# Patient Record
Sex: Female | Born: 1965
Health system: Southern US, Community
[De-identification: ages and names within clinical notes are randomized; demographics above are authoritative.]

## PROBLEM LIST (undated history)

## (undated) DIAGNOSIS — M329 Systemic lupus erythematosus, unspecified: Secondary | ICD-10-CM

## (undated) DIAGNOSIS — J449 Chronic obstructive pulmonary disease, unspecified: Secondary | ICD-10-CM

## (undated) DIAGNOSIS — F419 Anxiety disorder, unspecified: Secondary | ICD-10-CM

## (undated) DIAGNOSIS — E559 Vitamin D deficiency, unspecified: Secondary | ICD-10-CM

## (undated) DIAGNOSIS — J45909 Unspecified asthma, uncomplicated: Secondary | ICD-10-CM

## (undated) DIAGNOSIS — M069 Rheumatoid arthritis, unspecified: Secondary | ICD-10-CM

## (undated) DIAGNOSIS — C3490 Malignant neoplasm of unspecified part of unspecified bronchus or lung: Secondary | ICD-10-CM

## (undated) DIAGNOSIS — J189 Pneumonia, unspecified organism: Secondary | ICD-10-CM

## (undated) DIAGNOSIS — Z5189 Encounter for other specified aftercare: Secondary | ICD-10-CM

## (undated) DIAGNOSIS — I10 Essential (primary) hypertension: Secondary | ICD-10-CM

## (undated) DIAGNOSIS — K219 Gastro-esophageal reflux disease without esophagitis: Secondary | ICD-10-CM

## (undated) HISTORY — DX: Rheumatoid arthritis, unspecified: M06.9

## (undated) HISTORY — PX: BUNIONECTOMY: SHX129

## (undated) HISTORY — DX: Systemic lupus erythematosus, unspecified: M32.9

## (undated) HISTORY — DX: Gastro-esophageal reflux disease without esophagitis: K21.9

## (undated) HISTORY — DX: Vitamin D deficiency, unspecified: E55.9

## (undated) HISTORY — DX: Encounter for other specified aftercare: Z51.89

## (undated) HISTORY — DX: Essential (primary) hypertension: I10

## (undated) HISTORY — DX: Malignant neoplasm of unspecified part of unspecified bronchus or lung: C34.90

## (undated) HISTORY — DX: Anxiety disorder, unspecified: F41.9

## (undated) HISTORY — DX: Unspecified asthma, uncomplicated: J45.909

---

## 2000-09-29 ENCOUNTER — Emergency Department (HOSPITAL_COMMUNITY): Admission: EM | Admit: 2000-09-29 | Discharge: 2000-09-30 | Payer: Self-pay | Admitting: Emergency Medicine

## 2001-10-07 ENCOUNTER — Inpatient Hospital Stay (HOSPITAL_COMMUNITY): Admission: AD | Admit: 2001-10-07 | Discharge: 2001-10-10 | Payer: Self-pay | Admitting: Internal Medicine

## 2001-10-08 ENCOUNTER — Encounter: Payer: Self-pay | Admitting: Internal Medicine

## 2001-10-09 ENCOUNTER — Encounter: Payer: Self-pay | Admitting: Internal Medicine

## 2002-09-30 ENCOUNTER — Emergency Department (HOSPITAL_COMMUNITY): Admission: EM | Admit: 2002-09-30 | Discharge: 2002-09-30 | Payer: Self-pay | Admitting: Emergency Medicine

## 2002-09-30 ENCOUNTER — Encounter: Payer: Self-pay | Admitting: Emergency Medicine

## 2003-08-16 ENCOUNTER — Other Ambulatory Visit: Admission: RE | Admit: 2003-08-16 | Discharge: 2003-08-16 | Payer: Self-pay | Admitting: Obstetrics and Gynecology

## 2003-09-03 ENCOUNTER — Other Ambulatory Visit: Admission: RE | Admit: 2003-09-03 | Discharge: 2003-09-03 | Payer: Self-pay | Admitting: Obstetrics and Gynecology

## 2004-07-20 ENCOUNTER — Emergency Department (HOSPITAL_COMMUNITY): Admission: EM | Admit: 2004-07-20 | Discharge: 2004-07-20 | Payer: Self-pay | Admitting: Emergency Medicine

## 2005-06-18 ENCOUNTER — Other Ambulatory Visit: Admission: RE | Admit: 2005-06-18 | Discharge: 2005-06-18 | Payer: Self-pay | Admitting: Obstetrics and Gynecology

## 2007-02-14 ENCOUNTER — Other Ambulatory Visit: Admission: RE | Admit: 2007-02-14 | Discharge: 2007-02-14 | Payer: Self-pay | Admitting: Obstetrics and Gynecology

## 2008-02-16 ENCOUNTER — Other Ambulatory Visit: Admission: RE | Admit: 2008-02-16 | Discharge: 2008-02-16 | Payer: Self-pay | Admitting: Obstetrics and Gynecology

## 2011-04-17 NOTE — Discharge Summary (Signed)
[place]. [facility]  Patient:    Tammy Cordova, Tammy Cordova Visit Number: [id number] MRN: [id number]          Service Type: MED Location: [phone] Attending Physician:  Tammy Cordova Admit Date:  10/07/2001 Discharge Date: 10/10/2001   CC:         Tammy Cordova, M.D.   Discharge Summary  DATE OF BIRTH:  11-Apr-1966  DISCHARGE DIAGNOSES: 1. Severe left lower lobe - left upper lobe community acquired pneumonia    with septicemia and pleural effusion.    a. Hypoxia, resolved.    b. Blood and sputum cultures pending. 2. Severe dehydration, resolved. 3. Hypokalemia, resolved. 4. Tobacco abuse, cessation addressed during this hospital stay. 5. Mild abnormal liver function tests secondary to problem #1, improved. 6. Normocytic anemia, hemoglobin 10.4, MCV 85.  DISCHARGE MEDICATIONS: 1. Tequin 400 mg p.o. q.d. x 10 days. 2. Combivent MDI two puffs four times a day. 3. Humibid LA two tablets t.i.d. 4. Robitussin DM p.r.n. cough.  FOLLOW-UP:  Tammy Cordova will be followed in [facility] by Dr. Caryl Cordova on Friday, October 14, 2001, at 2:40 p.m.  During this follow-up visit, Dr. Caryl Cordova is to reassess the patients pulmonary status. We recommend a follow-up chest x-ray within two to three weeks upon discharge to confirm the total resolution of these severe infiltrates.  We also recommend following the patients sputum cultures along with a blood culture, that are prending at this point.  Finally, we recommend to encourage smoking cessation and consider Strep Pneumo vaccination this year.  CONSULTING PHYSICIANS:  None.  PROCEDURE:  None.  DISCHARGE LABORATORY DATA:  Hemoglobin 10.4, MCV 85, WBC 15.4, platelets 239. Sodium 137, potassium 5.4, chloride 108, CO2 24, BUN 4, creatinine 0.6, glucose 132.  SGOT 40, SGPT 31, alkaline phosphatase 83, total bilirubin 0.6, albumin 1.8.  Blood cultures negative x 2 x 3 days.  Sputum  gram stain consistent with gram negative rods, culture pending.  HOSPITAL COURSE:  Tammy Cordova is a very pleasant 45 year old female admitted to [facility] on October 07, 2001, with pneumonia associated with septicemia and hypoxia.  Please see admission H&P by Dr. Jamie Cordova for further details regarding the history of presentation, physical examination, and lab data.  #1 - Severe left lower lobe - left upper lobe community acquired pneumonia with septicemia and pleural effusion.  The patient presented with a temperature of 104, leukocytosis of 21,000, confused, tachycardic with a heart rate of 130s, and with a pulse oximetry of 88% on room air.  The x-ray obtained at [facility] revealed a bilobar pneumonia effecting both the left upper and left lower lobes.  Blood cultures were obtained prior to intravenous antibiotic therapy was started.  We ordered aggressive bronchodilator therapy around the clock along with Humibid for pulmonary toilette.  Once the patient was hydrated, she started producing phlegm. Sputum was sent for gram stain and culture.  This gram stain revealed gram negative rods.  The sputum culture is not finalized.  The patients fever and leukocytosis started to improve on day #2.  Her tachycardia long with confusion resolved by day #2.  On day #3, again the temperature continued improving and the patient became hemodynamically stable.  The blood and the sputum cultures were not finalized and the final results were not available at the time of this dictation.  On the day of discharge, the patient had a low grade temperature, though, her hypoxia was completely resolved.  She was able to ambulate experiencing minimal dyspnea of exertion, though, the oxygen saturation remained stable and above 91%.  The chest x-ray on day #2 showed significantly worsening of the bilobar infiltrate effecting the left lung associated with pleural effusion.  On  the day prior to discharge, early signs of improvement with increased aeration started to be noted.  In retrospect, we believe that this person presented with community acquired pneumonia.  She is a smoker and based on the gram stain, H.flu pneumonia is possible.  The patients antibiotic therapy was switched to tablets without complications and she was discharged home.  Further follow-up on the patients chest x-ray and leukocytosis is recommended.  The leukocytosis showed a steady improvement from 21,000 to 15,000 on the day of discharge.  #2 - Dehydration and hypokalemia.  The patient presented with severe dehydration associated with hypokalemia.  The serum potassium level was 3.3. With supplementation, this hypokalemia resolved.  Tammy Cordova received IV fluids throughout this hospital stay without complications.  AT the time of discharge, no evidence of dehydration was noted.  #3 - Normocytic anemia.  The patient presented with a hemoglobin of 11.5 with a normal MCV. With aggressive hydration, her hemoglobin stayed stable in the 10 range.  No signs of hemosis nor bleeding was noticed.  We recommend to follow the patients anemia within two weeks or so to confirm the improvement of this hemoglobin.  Her hemoglobin at the time of discharge was 10.4 with an MCV of 85.  If anemia is to persist for two to three weeks, further workup may be required.  #5 - Tobacco abuse.  We addressed smoking cessation throughout this hospital stay.  The patient declined nicotine patch.  She felt really strongly about quitting smoking.  The patients primary care Tammy Cordova will continue encouraging smoking cessation.  #6 - Mild abnormal LFTs.  These mild LFTs were related to problem #1. Abdominal examination was benign and the LFTs were improved by the time of discharge.  I spent about 40 minutes in the discharge process of this patient.  CONDITION ON DISCHARGE:  Improved. Attending Physician:   Tammy Cordova DD:  10/10/01 TD:  10/11/01  Job: [id number]

## 2011-04-17 NOTE — H&P (Signed)
Tammy Cordova. [facility]  [place]:    Tammy, Cordova Visit Number: [id number] MRN: [id number]          Service Type: MED Location: [phone] Attending Physician:  Tammy Cordova Dictated by:   Tammy Cordova, M.D. Admit Date:  10/07/2001   CC:         Tammy Cordova, M.D. in [facility]   History and Physical  DATE OF BIRTH:  05-07-1966  PROBLEM LIST: 1. Bilateral pneumonia with septicemia, community-acquired pneumonia. 2. Dehydration. 3. Tobacco abuse.  CHIEF COMPLAINT:  Shortness of breath and malaise.  HISTORY OF PRESENT ILLNESS:  Tammy Cordova is a very pleasant 45 year old female who presents with a six-day history of malaise, myalgias, fever, chills, diarrhea, and dry cough.  Tammy Cordova is a smoker.  No history of diabetes, hypertension, malignancy, or IV drug abuse.  Tammy Cordova was seen on October 03, 2001 in [facility] with a temperature of 102. She had a negative urinalysis and she was told that she had possibly a viral syndrome.  Two days later, Tammy Cordova returned feeling not better.  Her temperature was 102.7.  A CBC was obtained.  Tammy white cell count was 25,000 with a left shift and Tammy Cordova was started on Cefzil.   Tammy next day - October 06, 2001 - Tammy Cordova returned feeling somewhat better but Tammy temperature was 103.  Today, Tammy Cordova returned with shortness of breath. Her saturations were 88-90% on room air.  A chest x-ray showed bilateral lower lobe infiltrates.  Tammy Cordova denies hemoptysis, hematemesis, tuberculosis exposure or previous history with this infection.  No IV drug abuse.  No malignancy.  No diabetes, coronary artery disease, hypertension.  She denies focal weakness or syncope.  She describes lightheadedness standing up.  She describes anorexia and liquid diarrhea for Tammy last four to five days.  No abdominal pain.  Tammy last menstrual period was  on September 23, 2001.  She denies unprotected sex. She denies urinary symptoms.  No vaginal discharge.  No skin rash.  She describes some headaches though no photophobia.  No lower back pain.  She describes some pleuritic chest pain though no orthopnea.  She also reports some mild dyspnea of exertion.  PAST MEDICAL HISTORY:  As problem list.  ALLERGIES:  None.  MEDICATIONS:  Cefzil 200 mg p.o. b.i.d.  PAST MEDICAL HISTORY:  Significant for diabetes (her mother died from diabetic complications).  Also, her mother had a stroke at age 22.  Her brother and sister both have hypertension.  Her father had his first MI in his mid 35s. Her grandmother died of a cancer though she does not know Tammy type.  SOCIAL HISTORY:  Tammy Cordova is married.  No children.  She smoked about 10 cigarettes a day for Tammy last 17 years.  Occasional alcohol use.  No illegal drug abuse.  She works in [employer]  REVIEW OF SYSTEMS:  As HPI.  PHYSICAL EXAMINATION:  VITAL SIGNS:  Temperature 104.1, heart rate 125, blood pressure 138/76, respirations 36, oxygen saturation 88% on room air.  HEENT:  Normocephalic, nontraumatic.  Nonicteric sclerae.  Conjunctivae slightly pale.  Otherwise, within normal limits.  PERRLA, EOMI.  Funduscopic exam negative for papilledema or hemorrhages.  Very dry mucous membranes. Oropharynx clear.  TMs within Tammy normal limits.  NECK:  Supple.  No JVD, no bruits, no adenopathy, no thyromegaly.  LUNGS:  Bibasilar rales and rhonchi.  No wheezing.  Fair air movement bilaterally.  CARDIAC:  Tachycardic.  No murmurs, rubs, or gallops.  Normal S1, S2.  ABDOMEN:  Flat, nontender, nondistended.  Bowel sounds were present.  No hepatosplenomegaly.  No rebound, no guarding, no masses.  SKIN:  Normal tenting.  No rash.  GENITOURINARY:  Within Tammy normal limits.  BREAST:  Within Tammy normal limits.  RECTAL:  Empty vault, normal sphincter tone.  EXTREMITIES:  No edema,  clubbing, or cyanosis.  Pulses 2+ bilaterally.  NEUROLOGIC:  Alert and oriented x 3.  Strength 5/5 in all extremities.  DTRs 3/5 in all extremities.  Cranial nerves 2-12 intact.  Sensoria intact. Plantar reflexes downgoing bilaterally.  LABORATORY DATA:  Pending.  ASSESSMENT AND PLAN: 1. Bilateral pneumonia with septicemia - Tammy differential diagnosis includes    community-acquired pneumonia.  Given Tammy patients age, myoplasm, H. flu,    and streptococcal pneumonia tend to be Tammy most common microorganisms    associated with this presentation.  Also, viral pneumonia - specifically,    influenza - needs to be considered.  Other etiologies like Legionella or    fungi pneumonia tend to be less likely in this type of setting.    Plan:  Will admit Tammy Cordova to a regular bed.  Will obtain blood and    sputum studies.  Also will support Tammy Cordova with oxygen supplementation,    bronchodilator treatments around Tammy clock, and Humibid.  Intravenous    Tequin along with amantadine will be started for broad-spectrum coverage.    Will follow up closely Tammy patients chest x-ray and will be monitoring    Tammy patients respiratory status. 2. Dehydration - Tammy physical exam is consistent with a moderate to severe    degree of dehydration.  Tammy diarrhea and decreased p.o. intake seem to    be Tammy most likely sources of this dehydration, along with high fever.    Will start aggressive intravenous hydration therapy.  Tammy fluid balance    will be monitored along with orthostatics and daily weights. 3. Tobacco abuse - Spent about 15 minutes talking about smoking cessation.    For now Tammy Cordova declines Tammy nicotine patch. Dictated by:   Tammy Cordova, M.D. Attending Physician:  Tammy Cordova DD:  10/07/01 TD:  10/08/01 Job: [id number]

## 2012-08-27 ENCOUNTER — Encounter (HOSPITAL_COMMUNITY): Payer: Self-pay | Admitting: Adult Health

## 2012-08-27 ENCOUNTER — Emergency Department (HOSPITAL_COMMUNITY)
Admission: EM | Admit: 2012-08-27 | Discharge: 2012-08-28 | Disposition: A | Discharge status: Home / Self Care | Payer: PRIVATE HEALTH INSURANCE | Attending: Emergency Medicine | Admitting: Emergency Medicine

## 2012-08-27 ENCOUNTER — Emergency Department (HOSPITAL_COMMUNITY): Payer: PRIVATE HEALTH INSURANCE

## 2012-08-27 DIAGNOSIS — R0602 Shortness of breath: Secondary | ICD-10-CM | POA: Insufficient documentation

## 2012-08-27 DIAGNOSIS — J189 Pneumonia, unspecified organism: Secondary | ICD-10-CM

## 2012-08-27 DIAGNOSIS — R05 Cough: Secondary | ICD-10-CM | POA: Insufficient documentation

## 2012-08-27 DIAGNOSIS — IMO0001 Reserved for inherently not codable concepts without codable children: Secondary | ICD-10-CM | POA: Insufficient documentation

## 2012-08-27 DIAGNOSIS — R059 Cough, unspecified: Secondary | ICD-10-CM | POA: Insufficient documentation

## 2012-08-27 DIAGNOSIS — F17200 Nicotine dependence, unspecified, uncomplicated: Secondary | ICD-10-CM | POA: Insufficient documentation

## 2012-08-27 DIAGNOSIS — R079 Chest pain, unspecified: Secondary | ICD-10-CM | POA: Insufficient documentation

## 2012-08-27 HISTORY — DX: Pneumonia, unspecified organism: J18.9

## 2012-08-27 LAB — CBC
HCT: 32.8 % — ABNORMAL LOW (ref 36.0–46.0)
Hemoglobin: 11.1 g/dL — ABNORMAL LOW (ref 12.0–15.0)
MCHC: 33.8 g/dL (ref 30.0–36.0)
MCV: 87.5 fL (ref 78.0–100.0)
RDW: 13.9 % (ref 11.5–15.5)
WBC: 8.4 10*3/uL (ref 4.0–10.5)

## 2012-08-27 LAB — BASIC METABOLIC PANEL
BUN: 7 mg/dL (ref 6–23)
Chloride: 103 mEq/L (ref 96–112)
Creatinine, Ser: 0.52 mg/dL (ref 0.50–1.10)
GFR calc Af Amer: >90 mL/min (ref >90)
Glucose, Bld: 102 mg/dL — ABNORMAL HIGH (ref 70–99)

## 2012-08-27 LAB — POCT I-STAT TROPONIN I

## 2012-08-27 MED ORDER — SODIUM CHLORIDE 0.9 % IV BOLUS (SEPSIS)
1000.0000 mL | Freq: Once | INTRAVENOUS | Status: DC
Start: 2012-08-27 — End: 2012-08-28

## 2012-08-27 MED ORDER — DEXTROSE 5 % IV SOLN
1.0000 g | Freq: Once | INTRAVENOUS | Status: AC
  Administered 2012-08-28: 1 g via INTRAVENOUS
  Filled 2012-08-27: qty 10

## 2012-08-27 MED ORDER — DEXTROSE 5 % IV SOLN
500.0000 mg | Freq: Once | INTRAVENOUS | Status: AC
  Administered 2012-08-28: 500 mg via INTRAVENOUS
  Filled 2012-08-27: qty 500

## 2012-08-27 NOTE — ED Notes (Signed)
C/o GERD since Thursday after work associated with SOB, after passing gas today pain this AM pain resolved, pt now complaining of generalized body aches and frequency and urgency. Denies nausea, denies SOB but states, "my breathing ain't right"  Bilateral breath sounds clear, c/o pain on deep inspiration.

## 2012-08-27 NOTE — ED Provider Notes (Addendum)
History     CSN: [id number]  Arrival date & time 08/27/12  1744   First MD Initiated Contact with Patient 08/27/12 2314      Chief Complaint  Patient presents with  . Generalized Body Aches    (Consider location/radiation/quality/duration/timing/severity/associated sxs/prior treatment) HPI Comments: 46 y/o woman comes in with cc of sob and myalgias. Pt reports that for 1 day, she has been feeling tired, weak, and is having sob. Her sob is worse with her laying down, not necessarily exertional. She has a cough, producing green yellow phlegm, with mid sternal chest pain that is worse with inspiration, and mild fevers. She has no n/v/f/c. No medical hx, no risk factors for DVT, PE.  The history is provided by the patient.    Past Medical History  Diagnosis Date  . Pneumonia     History reviewed. No pertinent past surgical history.  History reviewed. No pertinent family history.  History  Substance Use Topics  . Smoking status: Current Every Day Smoker  . Smokeless tobacco: Not on file  . Alcohol Use: No    OB History    Grav Para Term Preterm Abortions TAB SAB Ect Mult Living                  Review of Systems  Constitutional: Positive for activity change.  HENT: Negative for facial swelling and neck pain.   Respiratory: Positive for cough and shortness of breath. Negative for wheezing.   Cardiovascular: Positive for chest pain. Negative for palpitations and leg swelling.  Gastrointestinal: Negative for nausea, vomiting, abdominal pain, diarrhea, constipation, blood in stool and abdominal distention.  Genitourinary: Negative for dysuria, hematuria and difficulty urinating.  Skin: Negative for color change.  Neurological: Negative for speech difficulty and headaches.  Hematological: Does not bruise/bleed easily.  Psychiatric/Behavioral: Negative for confusion.    Allergies  Review of patient's allergies indicates no known allergies.  Home Medications    Current Outpatient Rx  Name Route Sig Dispense Refill  . IBUPROFEN 200 MG PO TABS Oral Take 400 mg by mouth every 6 (six) hours as needed. For pain      BP 137/77  Pulse 104  Temp 101.3 F (38.5 C) (Oral)  Resp 20  SpO2 95%  LMP 08/04/2012  Physical Exam  Nursing note and vitals reviewed. Constitutional: She is oriented to person, place, and time. She appears well-developed.  HENT:  Head: Normocephalic and atraumatic.  Eyes: Conjunctivae normal and EOM are normal. Pupils are equal, round, and reactive to light.  Neck: Normal range of motion. Neck supple. No JVD present.  Cardiovascular: Normal rate, regular rhythm and normal heart sounds.   No murmur heard. Pulmonary/Chest: Breath sounds normal. No respiratory distress.       Bibasilar crackles, with diminished air movement on the right side. No rhonchi or wheezing. RR - 32  Abdominal: Soft. Bowel sounds are normal. She exhibits no distension. There is no tenderness. There is no rebound and no guarding.  Musculoskeletal: She exhibits no edema.       No unilateral swelling or calf tenderness  Neurological: She is alert and oriented to person, place, and time.  Skin: Skin is warm and dry.    ED Course  Procedures (including critical care time)  Labs Reviewed  CBC - Abnormal; Notable for the following:    RBC 3.75 (*)     Hemoglobin 11.1 (*)     HCT 32.8 (*)     All other components within normal  limits  BASIC METABOLIC PANEL - Abnormal; Notable for the following:    Sodium 134 (*)     Glucose, Bld 102 (*)     All other components within normal limits  POCT I-STAT TROPONIN I  URINALYSIS, ROUTINE W REFLEX MICROSCOPIC   Dg Chest 2 View  08/27/2012  *RADIOLOGY REPORT*  Clinical Data: Body aches.  CHEST - 2 VIEW  Comparison: Chest radiograph 12/27/2007  Findings: Heart silhouette is mildly enlarged.  There is a right basilar opacity and small effusion.  Mild atelectasis left lung base.  No pulmonary edema.  No  pneumothorax.  IMPRESSION: Right basilar opacity and effusion like represents pneumonia with peri pneumonic effusion.  Recommend follow-up chest radiographs to ensure resolution and exclude neoplastic process.   Original Report Authenticated By: Genevive Bi, M.D.      No diagnosis found.    MDM  Differential diagnosis includes: ACS syndrome CHF exacerbation Valvular disorder Myocarditis Pericarditis Pericardial effusion Pneumonia Pleural effusion Pulmonary edema PE Anemia Musculoskeletal pain COPD   Date: 08/28/2012  Rate: 104  Rhythm: sinus tachycardia  QRS Axis: normal  Intervals: normal  ST/T Wave abnormalities: normal  Conduction Disutrbances: none  Narrative Interpretation: unremarkable  Pt comes in with cc of sob - mostly at baseline, and worse with laying supine. She is also noted to have 3 SIRS criteria at arrival. Initial impression is that patient has a pneumonia - she has cough, fever and CXR showing right sided infiltrate. The orthopnea and PND are atypical - but likely due to pleural effusion. No evidence of left sided failure. No cardiac hx, BNP not ordered. She is tachycardic and tachypneic, but again i think its from the underlying pleural effusion. Norisk factros for PE, DVT.  Pt's CURB 65 score is 1. We will give her some hydration in the ED and start the AB. She has insurance, just needs a new doctor - which we will provide.     Derwood Kaplan, MD 08/28/12 0014  AMBULATORY PULSOX IS wnl - WILL DISCHARGE. REQUESTING PCP FOLLOW UP, TO MAKE SURE CXR CLEARS UP.  Derwood Kaplan, MD 08/28/12 [phone]

## 2012-08-28 LAB — POCT PREGNANCY, URINE: Preg Test, Ur: NEGATIVE

## 2012-08-28 LAB — URINALYSIS, ROUTINE W REFLEX MICROSCOPIC
Ketones, ur: 15 mg/dL — AB
Nitrite: NEGATIVE
Specific Gravity, Urine: 1.029 (ref 1.005–1.030)
pH: 5.5 (ref 5.0–8.0)

## 2012-08-28 LAB — URINE MICROSCOPIC-ADD ON

## 2012-08-28 MED ORDER — ACETAMINOPHEN 500 MG PO TABS: 500.0000 mg | ORAL_TABLET | Freq: Four times a day (QID) | ORAL | Status: DC | PRN

## 2012-08-28 MED ORDER — IBUPROFEN 600 MG PO TABS: 600.0000 mg | ORAL_TABLET | Freq: Four times a day (QID) | ORAL | Status: DC | PRN

## 2012-08-28 MED ORDER — ACETAMINOPHEN 325 MG PO TABS
650.0000 mg | ORAL_TABLET | Freq: Once | ORAL | Status: AC
  Administered 2012-08-28: 650 mg via ORAL
  Filled 2012-08-28: qty 2

## 2012-08-28 MED ORDER — IBUPROFEN 400 MG PO TABS: 600.0000 mg | ORAL_TABLET | Freq: Once | ORAL | Status: DC

## 2012-08-28 MED ORDER — AZITHROMYCIN 250 MG PO TABS: 250.0000 mg | ORAL_TABLET | Freq: Every day | ORAL | Status: DC

## 2012-08-28 NOTE — ED Notes (Signed)
Patient given discharge paperwork; went over discharge instructions with patient.  Patient instructed to take prescriptions as directed, to finish entire antibiotic prescription completely, to follow up with referral/PCP as listed, and to return to the ED for new, worsening, or concerning symptoms.

## 2012-08-28 NOTE — ED Notes (Signed)
Pt ambulated to bathroom 

## 2012-09-03 LAB — CULTURE, BLOOD (ROUTINE X 2): Culture: NO GROWTH

## 2012-09-03 LAB — CULTURE, BLOOD (SINGLE): Culture: NO GROWTH

## 2012-09-30 ENCOUNTER — Emergency Department (HOSPITAL_COMMUNITY)
Admission: EM | Admit: 2012-09-30 | Discharge: 2012-09-30 | Disposition: A | Discharge status: Home / Self Care | Payer: PRIVATE HEALTH INSURANCE | Attending: Emergency Medicine | Admitting: Emergency Medicine

## 2012-09-30 ENCOUNTER — Encounter (HOSPITAL_COMMUNITY): Payer: Self-pay | Admitting: *Deleted

## 2012-09-30 ENCOUNTER — Emergency Department (HOSPITAL_COMMUNITY): Payer: PRIVATE HEALTH INSURANCE

## 2012-09-30 DIAGNOSIS — R079 Chest pain, unspecified: Secondary | ICD-10-CM | POA: Insufficient documentation

## 2012-09-30 DIAGNOSIS — J189 Pneumonia, unspecified organism: Secondary | ICD-10-CM | POA: Insufficient documentation

## 2012-09-30 DIAGNOSIS — F17200 Nicotine dependence, unspecified, uncomplicated: Secondary | ICD-10-CM | POA: Insufficient documentation

## 2012-09-30 LAB — CBC WITH DIFFERENTIAL/PLATELET
Basophils Absolute: 0 10*3/uL (ref 0.0–0.1)
Basophils Relative: 0 % (ref 0–1)
Eosinophils Absolute: 0.2 10*3/uL (ref 0.0–0.7)
Hemoglobin: 11.6 g/dL — ABNORMAL LOW (ref 12.0–15.0)
MCH: 29.3 pg (ref 26.0–34.0)
MCHC: 33.7 g/dL (ref 30.0–36.0)
Monocytes Relative: 5 % (ref 3–12)
Neutro Abs: 3.1 10*3/uL (ref 1.7–7.7)
Neutrophils Relative %: 72 % (ref 43–77)
RDW: 15.1 % (ref 11.5–15.5)

## 2012-09-30 LAB — POCT I-STAT, CHEM 8
BUN: 3 mg/dL — ABNORMAL LOW (ref 6–23)
Calcium, Ion: 1.1 mmol/L — ABNORMAL LOW (ref 1.12–1.23)
Chloride: 108 meq/L (ref 96–112)
Creatinine, Ser: 0.6 mg/dL (ref 0.50–1.10)
Glucose, Bld: 94 mg/dL (ref 70–99)
HCT: 37 % (ref 36.0–46.0)
Hemoglobin: 12.6 g/dL (ref 12.0–15.0)
Potassium: 4.6 meq/L (ref 3.5–5.1)
Sodium: 142 meq/L (ref 135–145)
TCO2: 24 mmol/L (ref 0–100)

## 2012-09-30 MED ORDER — HYDROCODONE-ACETAMINOPHEN 5-500 MG PO TABS: 1.0000 | ORAL_TABLET | Freq: Four times a day (QID) | ORAL | Status: DC | PRN

## 2012-09-30 MED ORDER — ONDANSETRON HCL 4 MG/2ML IJ SOLN
4.0000 mg | Freq: Once | INTRAMUSCULAR | Status: AC
  Administered 2012-09-30: 4 mg via INTRAVENOUS
  Filled 2012-09-30: qty 2

## 2012-09-30 MED ORDER — IOHEXOL 350 MG/ML SOLN
100.0000 mL | Freq: Once | INTRAVENOUS | Status: AC | PRN
  Administered 2012-09-30: 100 mL via INTRAVENOUS

## 2012-09-30 MED ORDER — AZITHROMYCIN 250 MG PO TABS: 250.0000 mg | ORAL_TABLET | Freq: Every day | ORAL | Status: DC

## 2012-09-30 MED ORDER — DEXTROSE 5 % IV SOLN
1.0000 g | Freq: Once | INTRAVENOUS | Status: AC
  Administered 2012-09-30: 1 g via INTRAVENOUS
  Filled 2012-09-30: qty 10

## 2012-09-30 MED ORDER — MORPHINE SULFATE 4 MG/ML IJ SOLN
4.0000 mg | Freq: Once | INTRAMUSCULAR | Status: AC
  Administered 2012-09-30: 4 mg via INTRAVENOUS
  Filled 2012-09-30: qty 1

## 2012-09-30 MED ORDER — AZITHROMYCIN 250 MG PO TABS
500.0000 mg | ORAL_TABLET | Freq: Once | ORAL | Status: AC
  Administered 2012-09-30: 500 mg via ORAL
  Filled 2012-09-30: qty 2

## 2012-09-30 NOTE — ED Notes (Signed)
Pt states "I don't have a doctor and I just had pneumonia 9/28, it feels like that again, I cough up white, it hurts when I cough under my left breast"

## 2012-09-30 NOTE — ED Notes (Signed)
Took pt a cup of coke 

## 2012-09-30 NOTE — ED Provider Notes (Addendum)
History     CSN: [id number]  Arrival date & time 09/30/12  0915   First MD Initiated Contact with Patient 09/30/12 1023      Chief Complaint  Patient presents with  . Cough    (Consider location/radiation/quality/duration/timing/severity/associated sxs/prior treatment) Patient is a 46 y.o. female presenting with cough. The history is provided by the patient.  Cough This is a new problem. Episode onset: 3 days ago. Episode frequency: minimal cough. The problem has not changed since onset.The cough is non-productive. There has been no fever. Associated symptoms include chest pain and shortness of breath. Pertinent negatives include no wheezing. Associated symptoms comments: Severe chest pain on the left side that started 3 days ago.  Pain is pleuritic and worse with breathing.  Pain is a 9/10. Treatments tried: tylenol. The treatment provided no relief. She is a smoker. Her past medical history is significant for pneumonia. Her past medical history does not include COPD.    Past Medical History  Diagnosis Date  . Pneumonia     Past Surgical History  Procedure Date  . Bunionectomy     bilateral    No family history on file.  History  Substance Use Topics  . Smoking status: Current Every Day Smoker -- 0.5 packs/day    Types: Cigarettes  . Smokeless tobacco: Not on file  . Alcohol Use: No    OB History    Grav Para Term Preterm Abortions TAB SAB Ect Mult Living                  Review of Systems  Constitutional: Negative for fever.  Respiratory: Positive for cough and shortness of breath. Negative for wheezing.   Cardiovascular: Positive for chest pain. Negative for leg swelling.  Gastrointestinal: Negative for nausea, vomiting and abdominal pain.  Genitourinary: Negative for dysuria.  All other systems reviewed and are negative.    Allergies  Review of patient's allergies indicates no known allergies.  Home Medications   Current Outpatient Rx  Name Route Sig  Dispense Refill  . ACETAMINOPHEN 500 MG PO TABS Oral Take 1 tablet (500 mg total) by mouth every 6 (six) hours as needed for fever. 30 tablet 0  . IBUPROFEN 200 MG PO TABS Oral Take 600 mg by mouth every 6 (six) hours as needed. For pain      BP 134/81  Pulse 77  Temp 99.2 F (37.3 C) (Oral)  Resp 20  Wt 150 lb (68.04 kg)  SpO2 97%  LMP 09/16/2012  Physical Exam  Nursing note and vitals reviewed. Constitutional: She is oriented to person, place, and time. She appears well-developed and well-nourished. She appears distressed.  HENT:  Head: Normocephalic and atraumatic.  Mouth/Throat: Oropharynx is clear and moist.  Eyes: EOM are normal. Pupils are equal, round, and reactive to light.  Cardiovascular: Normal rate, regular rhythm, normal heart sounds and intact distal pulses.  Exam reveals no friction rub.   No murmur heard. Pulmonary/Chest: Effort normal. She has decreased breath sounds. She has no wheezes. She has rales. She exhibits tenderness.    Abdominal: Soft. Bowel sounds are normal. She exhibits no distension. There is no tenderness. There is no rebound and no guarding.  Musculoskeletal: Normal range of motion. She exhibits no tenderness.       No edema  Neurological: She is alert and oriented to person, place, and time. No cranial nerve deficit.  Skin: Skin is warm and dry. No rash noted.  Psychiatric: She has a normal  mood and affect. Her behavior is normal.    ED Course  Procedures (including critical care time)  Labs Reviewed  CBC WITH DIFFERENTIAL - Abnormal; Notable for the following:    Hemoglobin 11.6 (*)     HCT 34.4 (*)     All other components within normal limits  POCT I-STAT, CHEM 8 - Abnormal; Notable for the following:    BUN 3 (*)     Calcium, Ion 1.10 (*)     All other components within normal limits   Dg Chest 2 View  09/30/2012  *RADIOLOGY REPORT*  Clinical Data: Cough and shortness of breath.  Left-sided pain.  CHEST - 2 VIEW  Comparison:  08/27/2012  Findings: Mild hyperinflation.  Suspect degenerative irregularity of the sternoclavicular joints with soft tissue fullness on the lateral view at the level of the upper sternum.  Midline trachea.  Mild cardiomegaly. Mediastinal contours otherwise within normal limits.  No pleural effusion or pneumothorax.  The right lower lobe airspace disease has resolved.  There is new patchy left lower lobe airspace disease.  IMPRESSION:  1.  New left lower lobe airspace disease, which given the clinical history is suspicious for pneumonia. Recommend radiographic follow- up until clearing. 2.  Resolution of right lower lobe pneumonia. 3. Cardiomegaly without congestive failure.   Original Report Authenticated By: Jeronimo Greaves, M.D.    Ct Angio Chest Pe W/cm &/or Wo Cm  09/30/2012  *RADIOLOGY REPORT*  Clinical Data: 46 year old female cough, chest pain on the left.  CT ANGIOGRAPHY CHEST  Technique:  Multidetector CT imaging of the chest using the standard protocol during bolus administration of intravenous contrast. Multiplanar reconstructed images including MIPs were obtained and reviewed to evaluate the vascular anatomy.  Contrast: OMNIPAQUE IOHEXOL 350 MG/ML SOLN  Comparison: Chest radiograph the same day and earlier.  Findings: Good contrast bolus timing in the pulmonary arterial tree.  Mild respiratory motion artifact at the lung bases. No focal filling defect identified in the pulmonary arterial tree to suggest the presence of acute pulmonary embolism.  Centrilobular emphysema.  Left lower lobe posterior basal segment consolidation with small layering/subpulmonic pleural effusion. Major airways are patent.  Superimposed mild dependent atelectasis along the left major fissure and in the right costophrenic sulcus.  No pericardial or right pleural effusion.  No mediastinal lymphadenopathy.  Mild cardiomegaly.  Negative visualized upper abdominal viscera.  Negative visualized aorta.  Negative thoracic inlet.   Mildly increased number of axillary lymph nodes bilaterally.  These remain within normal limits for size (up to 12-13 mm diameter) and appear relatively symmetric.  No acute osseous abnormality identified.  IMPRESSION: 1. No evidence of acute pulmonary embolus. 2.  Left lower lobe consolidation and small pleural effusion compatible with pneumonia. 3.  Emphysema.  Mild cardiomegaly. 4.  Mildly increased in number but relatively symmetric and not abnormally enlarged bilateral axillary lymph nodes. Favor benign.   Original Report Authenticated By: Erskine Speed, M.D.      1. CAP (community acquired pneumonia)       MDM   Patient with a history of severe left-sided chest pain that started 3 days ago. She complains of very mild nonproductive cough and shortness of breath. She was diagnosed with pneumonia about one month ago and was treated but now is having pain on the left side. X-ray today shows a new left lower lobe airspace disease which could be pneumonia however patient denies fever and given that such a short time frame of symptoms and changing  sides concern that this may be PE. Will get CT to rule out PE and if neg will give abx. CBC, I-stat, CT pending.  12:45 PM CT with PNA only.  No PE.  Pt treated with azithromycin and rocephin d/ced with pain control.      Gwyneth Sprout, MD 09/30/12 1246  Gwyneth Sprout, MD 09/30/12 1259  Gwyneth Sprout, MD 09/30/12 1331

## 2013-01-22 ENCOUNTER — Emergency Department (HOSPITAL_COMMUNITY)
Admission: EM | Admit: 2013-01-22 | Discharge: 2013-01-22 | Disposition: A | Discharge status: Home / Self Care | Payer: PRIVATE HEALTH INSURANCE | Attending: Emergency Medicine | Admitting: Emergency Medicine

## 2013-01-22 ENCOUNTER — Encounter (HOSPITAL_COMMUNITY): Payer: Self-pay | Admitting: *Deleted

## 2013-01-22 ENCOUNTER — Emergency Department (HOSPITAL_COMMUNITY): Payer: PRIVATE HEALTH INSURANCE

## 2013-01-22 DIAGNOSIS — R059 Cough, unspecified: Secondary | ICD-10-CM | POA: Insufficient documentation

## 2013-01-22 DIAGNOSIS — Z8701 Personal history of pneumonia (recurrent): Secondary | ICD-10-CM | POA: Insufficient documentation

## 2013-01-22 DIAGNOSIS — J189 Pneumonia, unspecified organism: Secondary | ICD-10-CM

## 2013-01-22 DIAGNOSIS — F17200 Nicotine dependence, unspecified, uncomplicated: Secondary | ICD-10-CM | POA: Insufficient documentation

## 2013-01-22 DIAGNOSIS — R1012 Left upper quadrant pain: Secondary | ICD-10-CM | POA: Insufficient documentation

## 2013-01-22 DIAGNOSIS — R05 Cough: Secondary | ICD-10-CM | POA: Insufficient documentation

## 2013-01-22 DIAGNOSIS — J159 Unspecified bacterial pneumonia: Secondary | ICD-10-CM | POA: Insufficient documentation

## 2013-01-22 DIAGNOSIS — R0602 Shortness of breath: Secondary | ICD-10-CM | POA: Insufficient documentation

## 2013-01-22 MED ORDER — ALBUTEROL SULFATE HFA 108 (90 BASE) MCG/ACT IN AERS
2.0000 | INHALATION_SPRAY | RESPIRATORY_TRACT | Status: DC | PRN
  Administered 2013-01-22: 2 via RESPIRATORY_TRACT
  Filled 2013-01-22: qty 6.7

## 2013-01-22 MED ORDER — OXYCODONE-ACETAMINOPHEN 5-325 MG PO TABS
2.0000 | ORAL_TABLET | Freq: Once | ORAL | Status: AC
  Administered 2013-01-22: 2 via ORAL
  Filled 2013-01-22: qty 2

## 2013-01-22 MED ORDER — MOXIFLOXACIN HCL 400 MG PO TABS: 400.0000 mg | ORAL_TABLET | Freq: Every day | ORAL | Status: DC

## 2013-01-22 MED ORDER — HYDROCODONE-ACETAMINOPHEN 7.5-325 MG/15ML PO SOLN: 7.5000 mL | Freq: Four times a day (QID) | ORAL | Status: DC | PRN

## 2013-01-22 MED ORDER — LEVOFLOXACIN 500 MG PO TABS
500.0000 mg | ORAL_TABLET | Freq: Once | ORAL | Status: AC
  Administered 2013-01-22: 500 mg via ORAL
  Filled 2013-01-22: qty 1

## 2013-01-22 NOTE — ED Provider Notes (Signed)
History     CSN: [id number]  Arrival date & time 01/22/13  0421   First MD Initiated Contact with Patient 01/22/13 0534      Chief Complaint  Patient presents with  . Chest Pain    L rib pain  . Cough    (Consider location/radiation/quality/duration/timing/severity/associated sxs/prior treatment) HPI History provided by patient. Is a smoker with left-sided discomfort and cough ongoing for the last week. She has a history of pneumonia in the past and this feels similar. No hemoptysis. Some mild shortness of breath. Hurts to cough.  Pain is sharp in quality moderate in severity and not radiating. No rash or trauma. No leg pain or swelling. No history of DVT or PE.  Symptoms constant and progressively worsening. Past Medical History  Diagnosis Date  . Pneumonia     Past Surgical History  Procedure Laterality Date  . Bunionectomy      bilateral    History reviewed. No pertinent family history.  History  Substance Use Topics  . Smoking status: Current Every Day Smoker -- 0.50 packs/day    Types: Cigarettes  . Smokeless tobacco: Not on file  . Alcohol Use: No    OB History   Grav Para Term Preterm Abortions TAB SAB Ect Mult Living                  Review of Systems  Constitutional: Negative for fever and chills.  HENT: Negative for neck pain and neck stiffness.   Eyes: Negative for pain.  Respiratory: Positive for cough. Negative for wheezing.   Cardiovascular: Positive for chest pain. Negative for leg swelling.  Gastrointestinal: Negative for abdominal pain.  Genitourinary: Negative for dysuria.  Musculoskeletal: Negative for back pain.  Skin: Negative for rash.  Neurological: Negative for headaches.  All other systems reviewed and are negative.    Allergies  Review of patient's allergies indicates no known allergies.  Home Medications  No current outpatient prescriptions on file.  BP 117/79  Pulse 85  Temp(Src) 98.7 F (37.1 C) (Oral)  Resp 17  Ht  5\' 7"  (1.702 m)  Wt 147 lb (66.679 kg)  BMI 23.02 kg/m2  SpO2 97%  LMP 01/14/2013  Physical Exam  Constitutional: She is oriented to person, place, and time. She appears well-developed and well-nourished.  HENT:  Head: Normocephalic and atraumatic.  Eyes: EOM are normal. Pupils are equal, round, and reactive to light.  Neck: Neck supple.  Cardiovascular: Normal rate, regular rhythm and intact distal pulses.   Pulmonary/Chest: Effort normal. No respiratory distress. She has no wheezes. She has no rales.  Mildly decreased breath sounds left posterior lung base. Reproducible tenderness left lateral chest wall without crepitus or rash  Abdominal: Soft. Bowel sounds are normal. She exhibits no distension. There is no tenderness. There is no rebound.  No left upper quadrant tenderness and no CVA tenderness  Musculoskeletal: Normal range of motion. She exhibits no edema.  Neurological: She is alert and oriented to person, place, and time.  Skin: Skin is warm and dry.    ED Course  Procedures (including critical care time)  Labs Reviewed - No data to display Dg Chest 2 View  01/22/2013  *RADIOLOGY REPORT*  Clinical Data: Chest pain.  Left axillary rib pain.  Cough.  CHEST - 2 VIEW  Comparison: Chest 09/30/2012  Findings: There is infiltration in the left lung base which appears more prominent since the previous study suggesting recurrent pneumonia.  Scarring or atelectasis can also have this  appearance. Suggestion of developing infiltration in the right lung base as well.  Mild cardiac enlargement.  Normal pulmonary vascularity.  No blunting of costophrenic angles.  No pneumothorax.  Degenerative changes in the spine.  IMPRESSION: Increased infiltration in the left lung base since previous study suggesting recurrent pneumonia.  Atelectasis or infiltration in the right lung base is developing as well.  Follow-up radiograph in 3 months or after resolution of acute symptoms is recommended to exclude  underlying obstructing lesion.   Original Report Authenticated By: Burman Nieves, M.D.    Albuterol treatment provided. Antibiotics and pain medications provided  Plan outpatient followup. Smoking precautions provided. Pneumonia instructions and precautions verbalized is understood. MDM  Cough with chest discomfort and pneumonia on chest x-ray reviewed as above.  No hypoxia or significant dyspnea. Vital signs reviewed as above within normal limits.   Medications provided. Reliable historian and agrees to close outpatient followup and strict return precautions for any worsening condition.         Sunnie Nielsen, MD 01/22/13 [phone]

## 2013-01-22 NOTE — ED Notes (Signed)
Op[place], MD at bedside. 

## 2013-01-22 NOTE — ED Notes (Signed)
Pt c/o L rib/LUQ abdominal pain x 1 week w/ shortness of breath. Pt c/o cough starting x 3 days ago.

## 2013-01-25 ENCOUNTER — Emergency Department (HOSPITAL_COMMUNITY)
Admission: EM | Admit: 2013-01-25 | Discharge: 2013-01-25 | Disposition: A | Discharge status: Home / Self Care | Payer: PRIVATE HEALTH INSURANCE | Attending: Emergency Medicine | Admitting: Emergency Medicine

## 2013-01-25 ENCOUNTER — Emergency Department (HOSPITAL_COMMUNITY): Payer: PRIVATE HEALTH INSURANCE

## 2013-01-25 ENCOUNTER — Encounter (HOSPITAL_COMMUNITY): Payer: Self-pay | Admitting: *Deleted

## 2013-01-25 DIAGNOSIS — R071 Chest pain on breathing: Secondary | ICD-10-CM | POA: Insufficient documentation

## 2013-01-25 DIAGNOSIS — J189 Pneumonia, unspecified organism: Secondary | ICD-10-CM

## 2013-01-25 DIAGNOSIS — R059 Cough, unspecified: Secondary | ICD-10-CM | POA: Insufficient documentation

## 2013-01-25 DIAGNOSIS — R05 Cough: Secondary | ICD-10-CM | POA: Insufficient documentation

## 2013-01-25 DIAGNOSIS — J159 Unspecified bacterial pneumonia: Secondary | ICD-10-CM | POA: Insufficient documentation

## 2013-01-25 DIAGNOSIS — F17200 Nicotine dependence, unspecified, uncomplicated: Secondary | ICD-10-CM | POA: Insufficient documentation

## 2013-01-25 LAB — CBC WITH DIFFERENTIAL/PLATELET
Basophils Absolute: 0 10*3/uL (ref 0.0–0.1)
Eosinophils Absolute: 0.1 10*3/uL (ref 0.0–0.7)
Eosinophils Relative: 3 % (ref 0–5)
Lymphs Abs: 0.9 10*3/uL (ref 0.7–4.0)
MCH: 28.8 pg (ref 26.0–34.0)
MCV: 88.2 fL (ref 78.0–100.0)
Platelets: 416 10*3/uL — ABNORMAL HIGH (ref 150–400)
RDW: 14 % (ref 11.5–15.5)

## 2013-01-25 LAB — COMPREHENSIVE METABOLIC PANEL
ALT: 5 U/L (ref 0–35)
Calcium: 8.3 mg/dL — ABNORMAL LOW (ref 8.4–10.5)
GFR calc Af Amer: >90 mL/min (ref >90)
Glucose, Bld: 105 mg/dL — ABNORMAL HIGH (ref 70–99)
Sodium: 137 mEq/L (ref 135–145)
Total Protein: 7.9 g/dL (ref 6.0–8.3)

## 2013-01-25 LAB — URINALYSIS, ROUTINE W REFLEX MICROSCOPIC
Hgb urine dipstick: NEGATIVE
Leukocytes, UA: NEGATIVE
Nitrite: NEGATIVE
Specific Gravity, Urine: >1.046 — ABNORMAL HIGH (ref 1.005–1.030)
Urobilinogen, UA: 0.2 mg/dL (ref 0.0–1.0)

## 2013-01-25 MED ORDER — DOXYCYCLINE HYCLATE 100 MG PO CAPS: 100.0000 mg | ORAL_CAPSULE | Freq: Two times a day (BID) | ORAL | Status: DC

## 2013-01-25 MED ORDER — DEXTROSE 5 % IV SOLN
500.0000 mg | Freq: Once | INTRAVENOUS | Status: AC
  Administered 2013-01-25: 500 mg via INTRAVENOUS
  Filled 2013-01-25: qty 500

## 2013-01-25 MED ORDER — IOHEXOL 350 MG/ML SOLN: 100.0000 mL | Freq: Once | INTRAVENOUS | Status: DC | PRN

## 2013-01-25 MED ORDER — KETOROLAC TROMETHAMINE 30 MG/ML IJ SOLN
30.0000 mg | Freq: Once | INTRAMUSCULAR | Status: AC
  Administered 2013-01-25: 30 mg via INTRAVENOUS
  Filled 2013-01-25: qty 1

## 2013-01-25 MED ORDER — DEXTROSE 5 % IV SOLN
1.0000 g | Freq: Once | INTRAVENOUS | Status: AC
  Administered 2013-01-25: 1 g via INTRAVENOUS
  Filled 2013-01-25: qty 10

## 2013-01-25 NOTE — ED Notes (Signed)
Pt diagnosed with pneumonia on 2/23  Unable to afford prescription, feels worse today

## 2013-01-25 NOTE — ED Notes (Signed)
Patient transported to CT 

## 2013-01-25 NOTE — ED Provider Notes (Signed)
History     CSN: [id number]  Arrival date & time 01/25/13  4098   First MD Initiated Contact with Patient 01/25/13 [phone]      Chief Complaint  Patient presents with  . Shortness of Breath    (Consider location/radiation/quality/duration/timing/severity/associated sxs/prior treatment) HPI Comments: Patient diagnosed with pneumonia 3 days ago with left-sided chest discomfort, cough and shortness of breath. She did not feel her Avelox but she could not afford it. She is feeling worse with shortness of breath and coughing and pain. Denies fever, chills, nausea vomiting or abdominal pain. She continues to smoke. Denies any leg pain or swelling. She has had pneumonia in the past most recently in November 2013. No hemoptysis. No history of blood clots. The pain in her left lateral chest wall and worse with inspiration worse with coughing.  The history is provided by the patient.    Past Medical History  Diagnosis Date  . Pneumonia     Past Surgical History  Procedure Laterality Date  . Bunionectomy      bilateral    History reviewed. No pertinent family history.  History  Substance Use Topics  . Smoking status: Current Every Day Smoker -- 0.50 packs/day    Types: Cigarettes  . Smokeless tobacco: Not on file  . Alcohol Use: No    OB History   Grav Para Term Preterm Abortions TAB SAB Ect Mult Living                  Review of Systems  Constitutional: Negative for fever, activity change and appetite change.  HENT: Negative for congestion and rhinorrhea.   Respiratory: Positive for cough and shortness of breath. Negative for chest tightness.   Cardiovascular: Negative for chest pain.  Gastrointestinal: Negative for nausea, vomiting and abdominal pain.  Genitourinary: Negative for dysuria and hematuria.  Musculoskeletal: Negative for back pain.  Neurological: Negative for dizziness, weakness and headaches.  A complete 10 system review of systems was obtained and all systems  are negative except as noted in the HPI and PMH.    Allergies  Review of patient's allergies indicates no known allergies.  Home Medications   Current Outpatient Rx  Name  Route  Sig  Dispense  Refill  . HYDROcodone-acetaminophen (HYCET) 7.5-325 mg/15 ml solution   Oral   Take 7.5 mLs by mouth every 6 (six) hours as needed for pain or cough.   60 mL   0   . moxifloxacin (AVELOX) 400 MG tablet   Oral   Take 1 tablet (400 mg total) by mouth daily.   7 tablet   0     BP 116/83  Pulse 85  Temp(Src) 98.9 F (37.2 C) (Oral)  Resp 18  SpO2 99%  LMP 01/15/2013  Physical Exam  Constitutional: She is oriented to person, place, and time. She appears well-developed and well-nourished. No distress.  HENT:  Head: Normocephalic and atraumatic.  Mouth/Throat: Oropharynx is clear and moist. No oropharyngeal exudate.  Eyes: Conjunctivae and EOM are normal. Pupils are equal, round, and reactive to light.  Neck: Normal range of motion. Neck supple.  Cardiovascular: Normal rate, regular rhythm and normal heart sounds.   No murmur heard. Pulmonary/Chest: Effort normal and breath sounds normal. No respiratory distress. She exhibits tenderness.  Decreased breath sounds left base Left-sided chest wall tenderness without rash. No crepitance.  Abdominal: Soft. There is no tenderness. There is no rebound and no guarding.  No left upper quadrant tenderness, no CVA tenderness  Musculoskeletal: Normal range of motion. She exhibits no edema and no tenderness.  Neurological: She is alert and oriented to person, place, and time. No cranial nerve deficit. She exhibits normal muscle tone. Coordination normal.  Skin: Skin is warm.    ED Course  Procedures (including critical care time)  Labs Reviewed  CBC WITH DIFFERENTIAL - Abnormal; Notable for the following:    Platelets 416 (*)    All other components within normal limits  COMPREHENSIVE METABOLIC PANEL - Abnormal; Notable for the following:     Glucose, Bld 105 (*)    BUN 5 (*)    Calcium 8.3 (*)    Albumin 2.6 (*)    Total Bilirubin 0.2 (*)    All other components within normal limits  URINALYSIS, ROUTINE W REFLEX MICROSCOPIC - Abnormal; Notable for the following:    Specific Gravity, Urine >1.046 (*)    pH 8.5 (*)    All other components within normal limits  TROPONIN I   Ct Angio Chest Pe W/cm &/or Wo Cm  01/25/2013  *RADIOLOGY REPORT*  Clinical Data: Shortness of breath and cough.  Left rib and left upper quadrant pain for 1 week.  Smoker.  CT ANGIOGRAPHY CHEST  Technique:  Multidetector CT imaging of the chest using the standard protocol during bolus administration of intravenous contrast. Multiplanar reconstructed images including MIPs were obtained and reviewed to evaluate the vascular anatomy.  Contrast:  100 ml Omnipaque-300.  Comparison: CT chest 09/30/2012 and PA and lateral chest 01/22/2013.  Findings: No pulmonary embolus is identified.  The patient has a small left pleural effusion which appears simple.  No right pleural effusion is identified.  There is no pericardial effusion. Cardiomegaly is noted.  Multiple small bilateral axillary lymph nodes are identified.  There is no hilar or mediastinal lymphadenopathy.  Lungs demonstrate extensive emphysematous disease.  Mild dependent atelectasis is seen on the right.  Patchy airspace disease is seen in the left lung base.  Incidentally imaged upper abdomen is unremarkable.  No focal bony abnormalities identified.  IMPRESSION:  1.  Negative for pulmonary embolus. 2.  Small left pleural effusion and basilar airspace disease which could be atelectasis or infection. 3.  Emphysema. 4.  Small bilateral axillary lymph nodes in the greater number than typically seen of uncertain clinical significance.   Original Report Authenticated By: Holley Dexter, M.D.      No diagnosis found.    MDM  Diagnosed with recurrent pneumonia 3 days ago with shortness of breath and cough. Unable  to afford prescription. No hypoxia, no increased work or breathing, no distress.  No PE but CT remarkable for infiltrate with small pleural effusion. Patient remains nontoxic appearing no tachycardia or hypoxia. We'll treat outpatient for community-acquired pneumonia. She did not afford Avelox so we'll switch to doxycycline.   Date: 01/25/2013  Rate: 71  Rhythm: normal sinus rhythm  QRS Axis: normal  Intervals: normal  ST/T Wave abnormalities: normal  Conduction Disutrbances:none  Narrative Interpretation:   Old EKG Reviewed: unchanged       Glynn Octave, MD 01/25/13 [phone]

## 2013-06-07 ENCOUNTER — Emergency Department (HOSPITAL_COMMUNITY): Payer: Self-pay

## 2013-06-07 ENCOUNTER — Emergency Department (HOSPITAL_COMMUNITY)
Admission: EM | Admit: 2013-06-07 | Discharge: 2013-06-07 | Disposition: A | Discharge status: Home / Self Care | Payer: Self-pay | Attending: Emergency Medicine | Admitting: Emergency Medicine

## 2013-06-07 ENCOUNTER — Encounter (HOSPITAL_COMMUNITY): Payer: Self-pay | Admitting: Emergency Medicine

## 2013-06-07 DIAGNOSIS — F17200 Nicotine dependence, unspecified, uncomplicated: Secondary | ICD-10-CM | POA: Insufficient documentation

## 2013-06-07 DIAGNOSIS — Z8701 Personal history of pneumonia (recurrent): Secondary | ICD-10-CM | POA: Insufficient documentation

## 2013-06-07 DIAGNOSIS — R071 Chest pain on breathing: Secondary | ICD-10-CM | POA: Insufficient documentation

## 2013-06-07 DIAGNOSIS — R0781 Pleurodynia: Secondary | ICD-10-CM

## 2013-06-07 DIAGNOSIS — M549 Dorsalgia, unspecified: Secondary | ICD-10-CM | POA: Insufficient documentation

## 2013-06-07 LAB — CBC WITH DIFFERENTIAL/PLATELET
Basophils Absolute: 0 10*3/uL (ref 0.0–0.1)
Lymphocytes Relative: 19 % (ref 12–46)
Lymphs Abs: 1 10*3/uL (ref 0.7–4.0)
Neutro Abs: 3.7 10*3/uL (ref 1.7–7.7)
Neutrophils Relative %: 71 % (ref 43–77)
Platelets: 450 10*3/uL — ABNORMAL HIGH (ref 150–400)
RBC: 3.9 MIL/uL (ref 3.87–5.11)
WBC: 5.2 10*3/uL (ref 4.0–10.5)

## 2013-06-07 LAB — BASIC METABOLIC PANEL
CO2: 26 mEq/L (ref 19–32)
Glucose, Bld: 115 mg/dL — ABNORMAL HIGH (ref 70–99)
Potassium: 3.5 mEq/L (ref 3.5–5.1)
Sodium: 138 mEq/L (ref 135–145)

## 2013-06-07 LAB — POCT I-STAT TROPONIN I: Troponin i, poc: 0.01 ng/mL (ref 0.00–0.08)

## 2013-06-07 LAB — PRO B NATRIURETIC PEPTIDE: Pro B Natriuretic peptide (BNP): 68.8 pg/mL (ref 0–125)

## 2013-06-07 LAB — TROPONIN I: Troponin I: <0.3 ng/mL (ref <0.30)

## 2013-06-07 MED ORDER — AZITHROMYCIN 250 MG PO TABS: 250.0000 mg | ORAL_TABLET | Freq: Every day | ORAL | Status: DC

## 2013-06-07 MED ORDER — IBUPROFEN 600 MG PO TABS: 600.0000 mg | ORAL_TABLET | Freq: Four times a day (QID) | ORAL | Status: DC | PRN

## 2013-06-07 NOTE — ED Notes (Signed)
Pt states that she woke up this morning with SOB.  States that she has felt this way before and that 4 times, it has been pneumonia.  Pt does not appear to be in any distress at the moment.  Denies cough and chest pain.

## 2013-06-07 NOTE — Progress Notes (Signed)
Pt listed as [employer] covered but states she no longer has coverage and is self pay without a pcp  CM spoke with pt who confirms self pay [facility] [place] resident with no pcp. CM discussed and provided written information for self pay pcps, importance of pcp for f/u care, www.needymeds.org, discounted pharmacies and other [place] resources such as financial assistance, DSS and  health department  Reviewed resources for [employer] self pay pcps like [facility], family medicine at [address], [facility] family practice, general medical clinics, [facility] urgent care plus others, [facility] out patient pharmacies and housing Pt voiced understanding and appreciation of resources provided  Cm provided a list of [facility] $4 list of generic medications, a map to [facility] outpatient pharmacy and a referral to [facility] liaison per her permission

## 2013-06-07 NOTE — ED Notes (Signed)
She remains in no distress; and I have gotten her a Sprite per her request.

## 2013-06-07 NOTE — ED Provider Notes (Addendum)
History    CSN: [id number] Arrival date & time 06/07/13  0910  First MD Initiated Contact with Patient 06/07/13 [phone]     Chief Complaint  Patient presents with  . Shortness of Breath   (Consider location/radiation/quality/duration/timing/severity/associated sxs/prior Treatment) HPI Comments: 41 u/o woman with no medical hx comes in with cc of dib. Pt started having dib this am, and has dib with minimal exertion. No new cough, fevers, chills. Pt does have right sided back pain - flank area. No hx of renal stones. The pain is intermittent, sharp, with no specific provoking reason. Pt states that the pain just comes and goes, and might be worse with inspiration.  No cardiac hx, no orthopnea, PND, leg swelling, no hx of PE, DVT, and no risk factors for the same and had CT x 2 over the past year with no PE.  Patient is a 47 y.o. female presenting with shortness of breath. The history is provided by the patient and medical records.  Shortness of Breath Associated symptoms: chest pain   Associated symptoms: no abdominal pain, no cough, no neck pain, no vomiting and no wheezing    Past Medical History  Diagnosis Date  . Pneumonia    Past Surgical History  Procedure Laterality Date  . Bunionectomy      bilateral   No family history on file. History  Substance Use Topics  . Smoking status: Current Every Day Smoker -- 0.50 packs/day    Types: Cigarettes  . Smokeless tobacco: Not on file  . Alcohol Use: No   OB History   Grav Para Term Preterm Abortions TAB SAB Ect Mult Living                 Review of Systems  Constitutional: Positive for activity change. Negative for fatigue.  HENT: Negative for facial swelling and neck pain.   Respiratory: Positive for shortness of breath. Negative for cough and wheezing.   Cardiovascular: Positive for chest pain. Negative for palpitations and leg swelling.  Gastrointestinal: Negative for nausea, vomiting, abdominal pain, diarrhea, constipation,  blood in stool and abdominal distention.  Genitourinary: Negative for hematuria and difficulty urinating.  Skin: Negative for color change.  Neurological: Negative for speech difficulty.  Hematological: Does not bruise/bleed easily.  Psychiatric/Behavioral: Negative for confusion.    Allergies  Review of patient's allergies indicates no known allergies.  Home Medications   Current Outpatient Rx  Name  Route  Sig  Dispense  Refill  . ibuprofen (ADVIL,MOTRIN) 200 MG tablet   Oral   Take 200 mg by mouth every 6 (six) hours as needed for pain (pain).          BP 144/84  Pulse 75  Temp(Src) 98.5 F (36.9 C) (Oral)  Resp 20  SpO2 93%  LMP 06/07/2013 Physical Exam  Nursing note and vitals reviewed. Constitutional: She is oriented to person, place, and time. She appears well-developed and well-nourished.  HENT:  Head: Normocephalic and atraumatic.  Eyes: EOM are normal. Pupils are equal, round, and reactive to light.  Neck: Neck supple. No JVD present.  Cardiovascular: Normal rate, regular rhythm and normal heart sounds.   No murmur heard. Pulmonary/Chest: Effort normal. No respiratory distress. She has no wheezes. She exhibits no tenderness.  Bibasilar crackles  Abdominal: Soft. She exhibits no distension. There is no tenderness. There is no rebound and no guarding.  Neurological: She is alert and oriented to person, place, and time.  Skin: Skin is warm and dry.  ED Course  Procedures (including critical care time) Labs Reviewed  CBC WITH DIFFERENTIAL  BASIC METABOLIC PANEL  TROPONIN I  PRO B NATRIURETIC PEPTIDE   No results found. No diagnosis found.  MDM   Date: 06/07/2013  Rate: 84  Rhythm: normal sinus rhythm  QRS Axis: normal  Intervals: normal  ST/T Wave abnormalities: nonspecific ST/T changes  Conduction Disutrbances:none  Narrative Interpretation:   Old EKG Reviewed: changes noted - t wave inversion in the v2, v3  Pt comes in with dyspnea. Pt has  no heavy cardiac risk factors, no premature CAD. EKG has some t wave changes in v1, v2 -not sure what to make off it. The pain is intermittent, right sided, non exertional and very atypical. Will get troponin x 2, anf ask for PCP follow up. Pt's lung exam does indicate some rales. BNP ordered. CXR ordered to r/o infiltrate.     Derwood Kaplan, MD 06/07/13 1001  trops x 2 neg. Will give CAP meds as wait and watch approach. Pt understands the plan.  Derwood Kaplan, MD 06/07/13 1444

## 2013-06-07 NOTE — Progress Notes (Signed)
[facility] has seen patient and provided her with a oc application. °

## 2016-04-03 ENCOUNTER — Other Ambulatory Visit (HOSPITAL_COMMUNITY)
Admission: RE | Admit: 2016-04-03 | Discharge: 2016-04-03 | Disposition: A | Discharge status: Home / Self Care | Payer: 59 | Source: Ambulatory Visit | Attending: Family Medicine | Admitting: Family Medicine

## 2016-04-03 ENCOUNTER — Other Ambulatory Visit: Payer: Self-pay | Admitting: Family Medicine

## 2016-04-03 DIAGNOSIS — Z124 Encounter for screening for malignant neoplasm of cervix: Secondary | ICD-10-CM | POA: Insufficient documentation

## 2016-04-08 LAB — CYTOLOGY - PAP

## 2016-04-14 ENCOUNTER — Other Ambulatory Visit: Payer: Self-pay | Admitting: Family Medicine

## 2016-04-14 DIAGNOSIS — N926 Irregular menstruation, unspecified: Secondary | ICD-10-CM

## 2016-04-16 ENCOUNTER — Other Ambulatory Visit: Payer: PRIVATE HEALTH INSURANCE

## 2016-04-20 ENCOUNTER — Ambulatory Visit
Admission: RE | Admit: 2016-04-20 | Discharge: 2016-04-20 | Disposition: A | Discharge status: Home / Self Care | Payer: 59 | Source: Ambulatory Visit | Attending: Family Medicine | Admitting: Family Medicine

## 2016-04-20 DIAGNOSIS — N926 Irregular menstruation, unspecified: Secondary | ICD-10-CM

## 2016-04-23 ENCOUNTER — Other Ambulatory Visit: Payer: Self-pay

## 2016-04-23 DIAGNOSIS — Z1231 Encounter for screening mammogram for malignant neoplasm of breast: Secondary | ICD-10-CM

## 2016-04-30 HISTORY — PX: BREAST BIOPSY: SHX20

## 2016-05-04 ENCOUNTER — Ambulatory Visit
Admission: RE | Admit: 2016-05-04 | Discharge: 2016-05-04 | Disposition: A | Discharge status: Home / Self Care | Payer: 59 | Source: Ambulatory Visit

## 2016-05-04 DIAGNOSIS — Z1231 Encounter for screening mammogram for malignant neoplasm of breast: Secondary | ICD-10-CM

## 2016-05-06 ENCOUNTER — Other Ambulatory Visit: Payer: Self-pay | Admitting: Family Medicine

## 2016-05-06 DIAGNOSIS — R928 Other abnormal and inconclusive findings on diagnostic imaging of breast: Secondary | ICD-10-CM

## 2016-05-13 ENCOUNTER — Ambulatory Visit
Admission: RE | Admit: 2016-05-13 | Discharge: 2016-05-13 | Disposition: A | Discharge status: Home / Self Care | Payer: 59 | Source: Ambulatory Visit | Attending: Family Medicine | Admitting: Family Medicine

## 2016-05-13 ENCOUNTER — Other Ambulatory Visit: Payer: Self-pay | Admitting: Family Medicine

## 2016-05-13 DIAGNOSIS — R928 Other abnormal and inconclusive findings on diagnostic imaging of breast: Secondary | ICD-10-CM

## 2016-05-19 ENCOUNTER — Other Ambulatory Visit: Payer: Self-pay | Admitting: Family Medicine

## 2016-05-19 DIAGNOSIS — R928 Other abnormal and inconclusive findings on diagnostic imaging of breast: Secondary | ICD-10-CM

## 2016-05-20 ENCOUNTER — Other Ambulatory Visit (HOSPITAL_COMMUNITY): Payer: Self-pay | Admitting: Diagnostic Radiology

## 2016-05-20 ENCOUNTER — Ambulatory Visit
Admission: RE | Admit: 2016-05-20 | Discharge: 2016-05-20 | Disposition: A | Discharge status: Home / Self Care | Payer: 59 | Source: Ambulatory Visit | Attending: Family Medicine | Admitting: Family Medicine

## 2016-05-20 DIAGNOSIS — R928 Other abnormal and inconclusive findings on diagnostic imaging of breast: Secondary | ICD-10-CM

## 2016-06-15 ENCOUNTER — Other Ambulatory Visit: Payer: Self-pay | Admitting: Family Medicine

## 2016-06-15 DIAGNOSIS — N83202 Unspecified ovarian cyst, left side: Secondary | ICD-10-CM

## 2016-06-17 ENCOUNTER — Other Ambulatory Visit: Payer: 59

## 2016-06-23 ENCOUNTER — Ambulatory Visit
Admission: RE | Admit: 2016-06-23 | Discharge: 2016-06-23 | Disposition: A | Discharge status: Home / Self Care | Payer: 59 | Source: Ambulatory Visit | Attending: Family Medicine | Admitting: Family Medicine

## 2016-06-23 DIAGNOSIS — N83202 Unspecified ovarian cyst, left side: Secondary | ICD-10-CM

## 2017-02-15 IMAGING — US US TRANSVAGINAL NON-OB
1 series · 13 of 25 positions shown · non-contrast
Comparison: None in PACs

CLINICAL DATA: Heavy irregular prolonged menstrual periods for the



[Series 1: us transvaginal non-ob · 0.28mm/px · 13 of 79 slices shown]
[im 1/79]
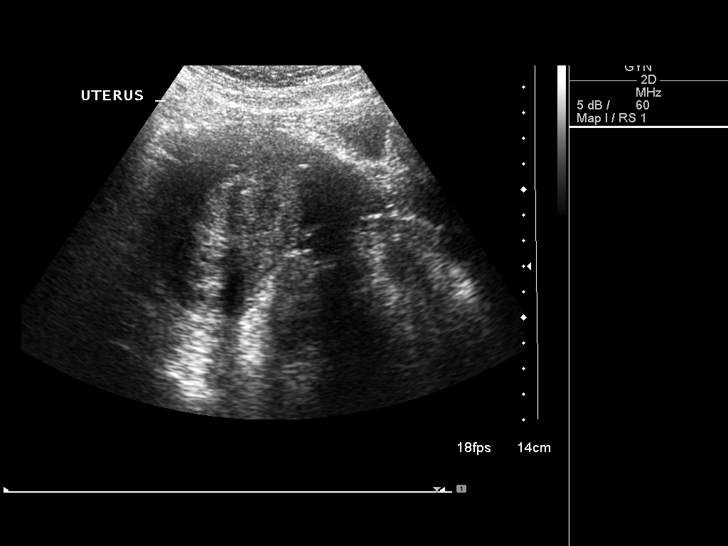
[im 7/79]
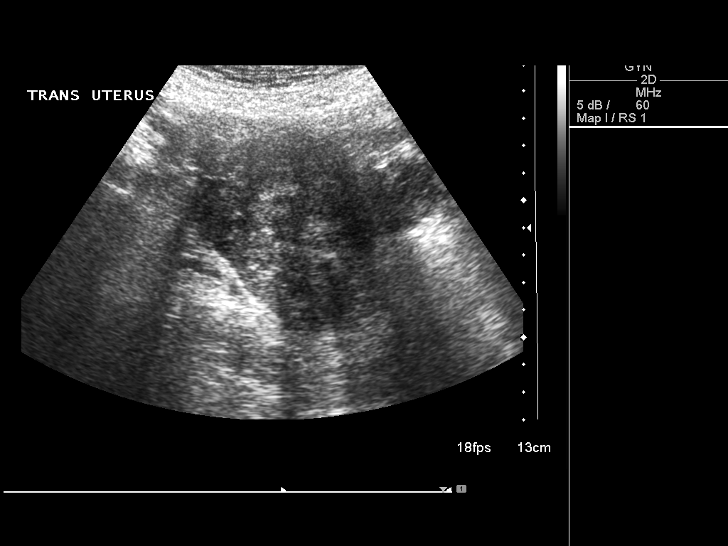
[im 14/79]
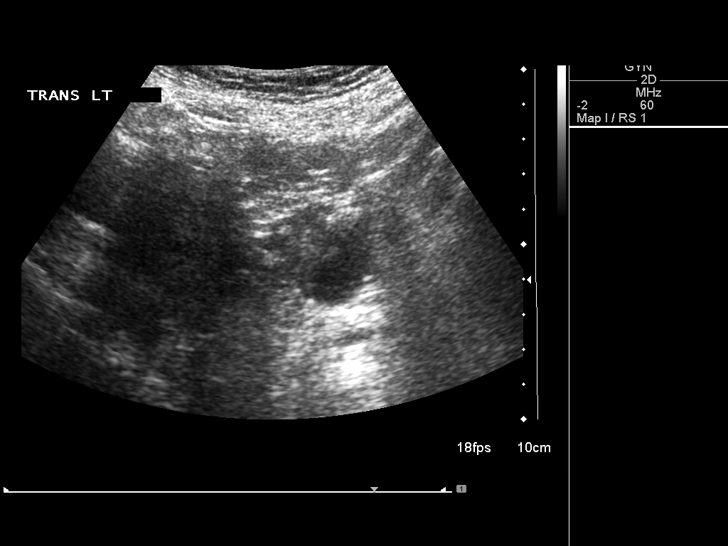
[im 20/79]
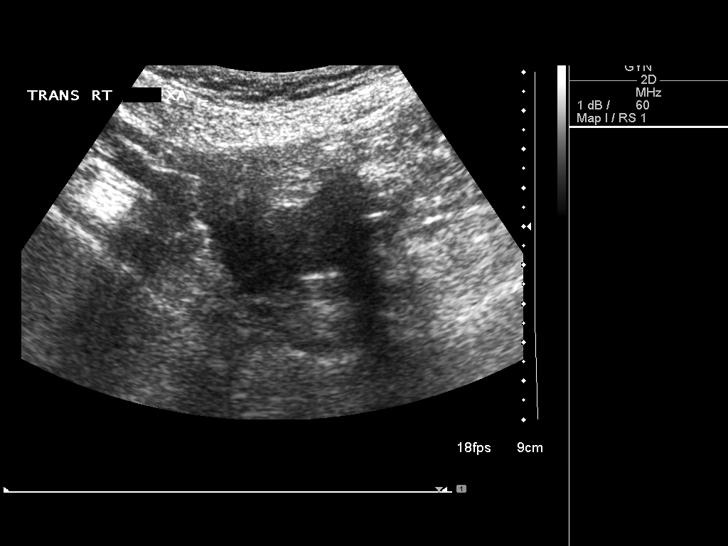
[im 27/79]
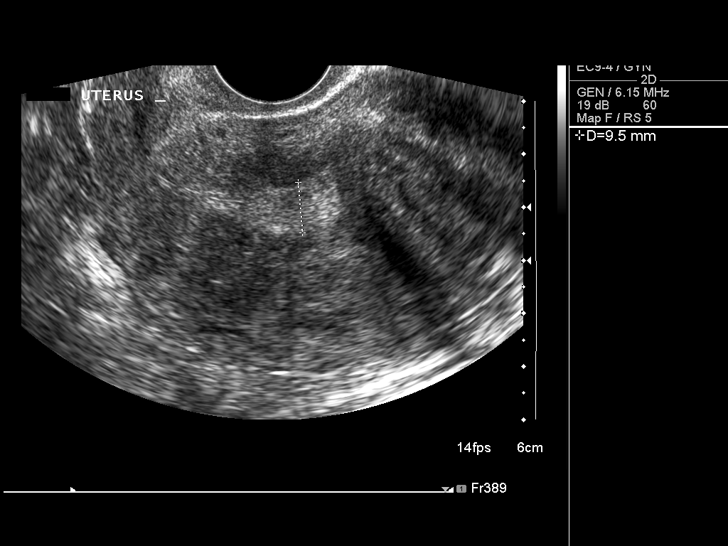
[im 33/79]
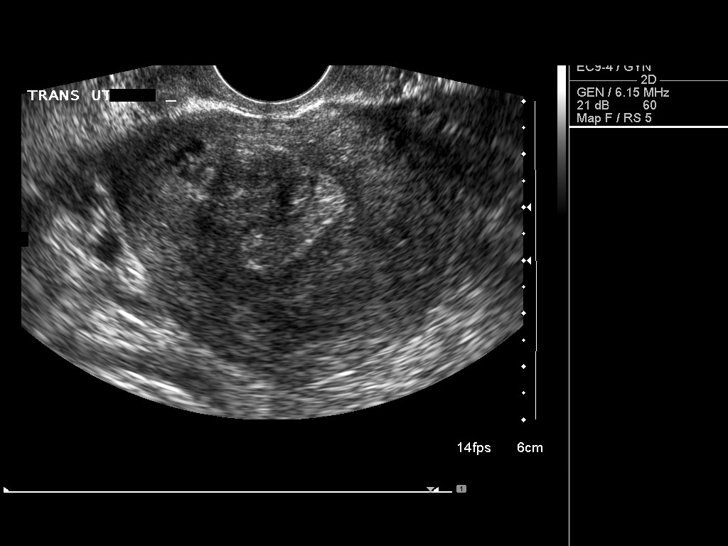
[im 40/79]
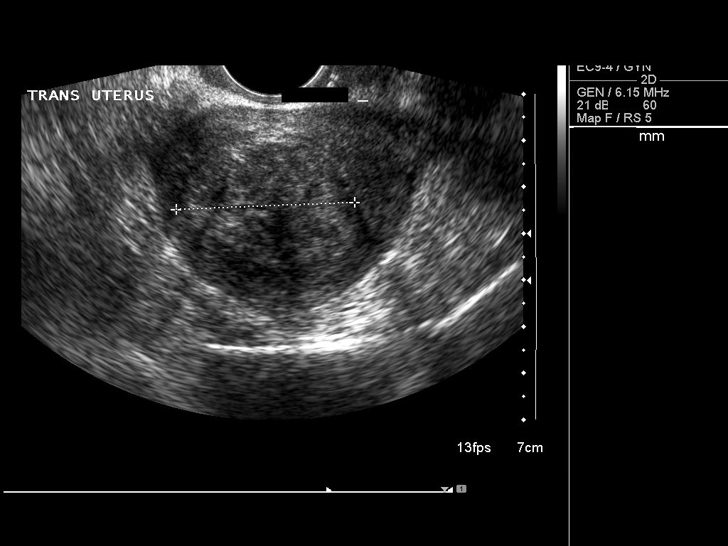
[im 46/79]
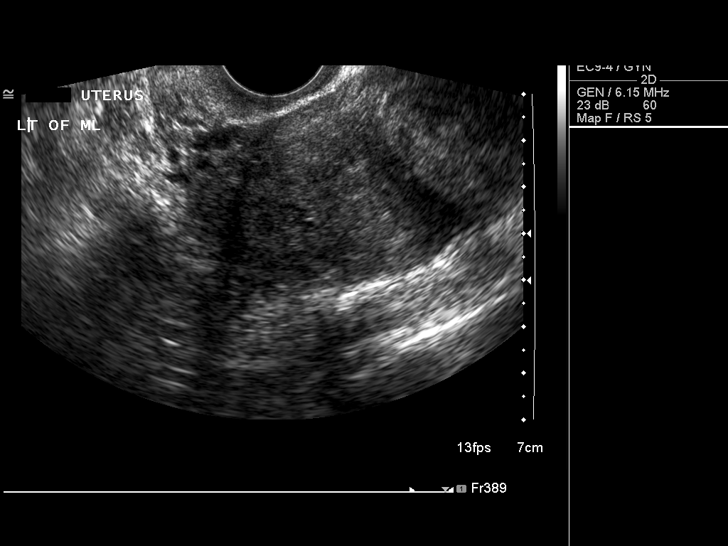
[im 53/79]
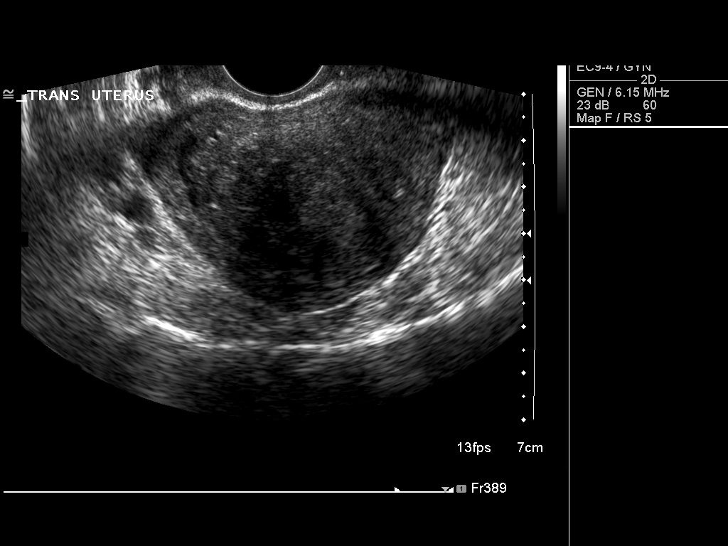
[im 59/79]
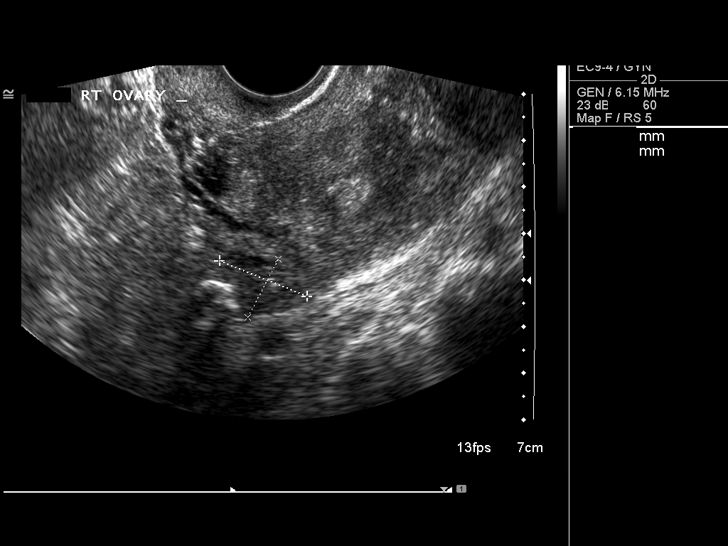
[im 66/79]
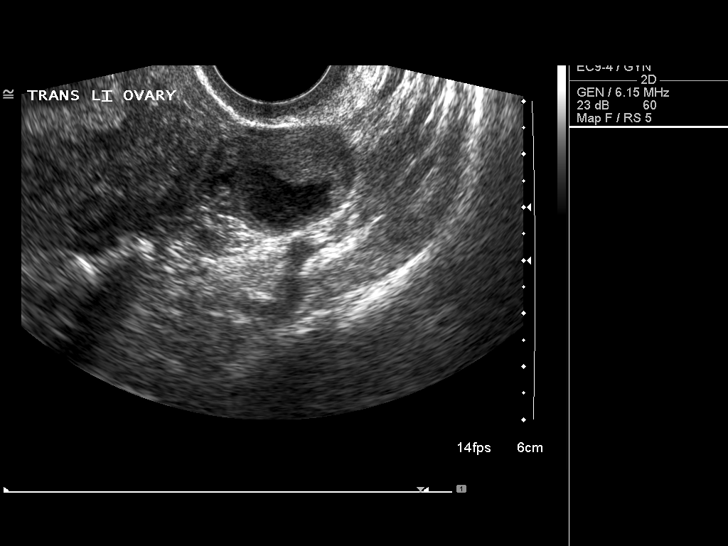
[im 72/79]
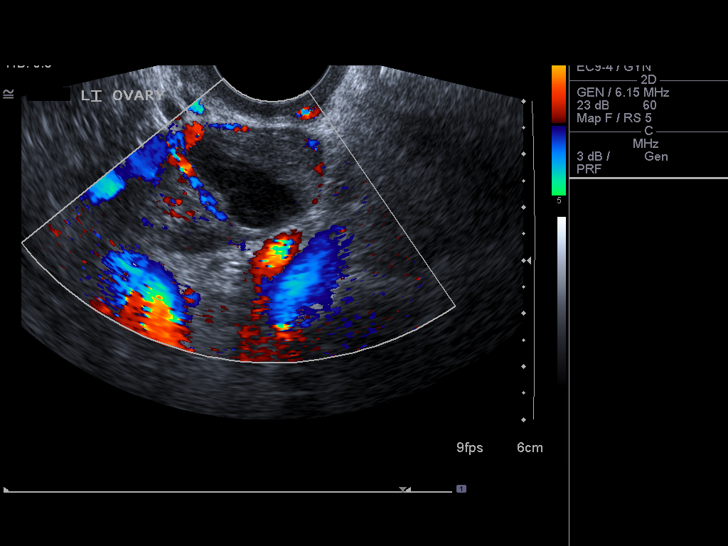
[im 79/79]
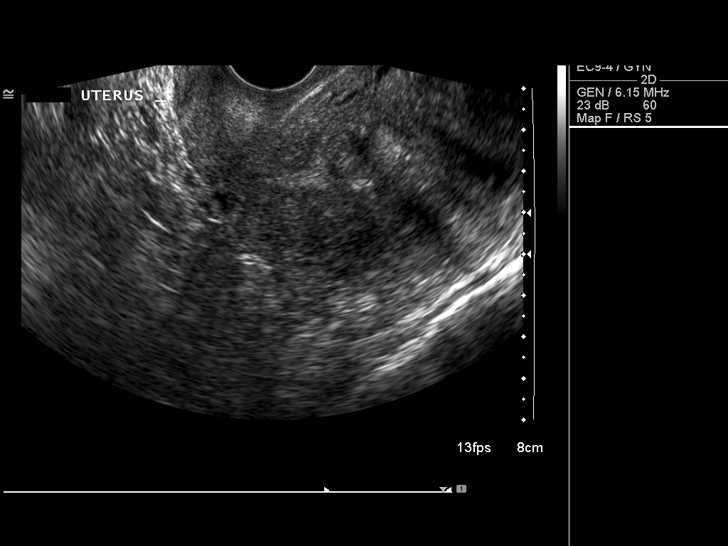

[13 of 25 positions shown; findings below may reference images not displayed]

FINDINGS: Uterus

Measurements: 9.8 x 5.3 x 6.6 cm. The echotexture is heterogeneous.
The uterus is retroverted. There is a posterior fundal fibroid
measuring 3.2 x 3.2 x 3.8 cm. There is a subendometrial anterior
fibroid to the right measuring 1.8 x 1.5 x 1.9 cm.

Endometrium

Thickness: 9.5 mm. There are no abnormal endometrial fluid
collections or masses.

Right ovary

Measurements: 2.0 x 1.4 x 2.9 cm. Normal appearance/no adnexal mass.

Left ovary

Measurements: 3.3 x 2.4 x 2.9 cm. There is a complex appearing left
ovarian cyst measuring 2.5 x 1.9 x 2.2 cm.

Other findings

No abnormal free fluid.
IMPRESSION: 1. Uterine fibroids. The endometrial stripe is normal in thickness
at 9.5 mm. If bleeding remains unresponsive to hormonal or medical
therapy, sonohysterogram should be considered for focal lesion
work-up. (Ref: Radiological Reasoning: Algorithmic Workup of
Abnormal Vaginal Bleeding with Endovaginal Sonography and
Sonohysterography. AJR 1772; 191:S68-73)
2. Probable hemorrhagic cyst within the left ovary measuring up to
2.5 cm in diameter. Follow-up ultrasound in 8-12 weeks is
recommended to reassess the structure.

## 2017-02-27 ENCOUNTER — Encounter (HOSPITAL_COMMUNITY): Payer: Self-pay | Admitting: Emergency Medicine

## 2017-02-27 ENCOUNTER — Emergency Department (HOSPITAL_COMMUNITY)
Admission: EM | Admit: 2017-02-27 | Discharge: 2017-02-27 | Disposition: A | Discharge status: Home / Self Care | Payer: 59 | Attending: Emergency Medicine | Admitting: Emergency Medicine

## 2017-02-27 DIAGNOSIS — Y92410 Unspecified street and highway as the place of occurrence of the external cause: Secondary | ICD-10-CM | POA: Diagnosis not present

## 2017-02-27 DIAGNOSIS — Z79899 Other long term (current) drug therapy: Secondary | ICD-10-CM | POA: Diagnosis not present

## 2017-02-27 DIAGNOSIS — M545 Low back pain, unspecified: Secondary | ICD-10-CM

## 2017-02-27 DIAGNOSIS — S3992XA Unspecified injury of lower back, initial encounter: Secondary | ICD-10-CM | POA: Diagnosis present

## 2017-02-27 DIAGNOSIS — S39012A Strain of muscle, fascia and tendon of lower back, initial encounter: Secondary | ICD-10-CM | POA: Insufficient documentation

## 2017-02-27 DIAGNOSIS — F17210 Nicotine dependence, cigarettes, uncomplicated: Secondary | ICD-10-CM | POA: Insufficient documentation

## 2017-02-27 DIAGNOSIS — Y999 Unspecified external cause status: Secondary | ICD-10-CM | POA: Insufficient documentation

## 2017-02-27 DIAGNOSIS — Y939 Activity, unspecified: Secondary | ICD-10-CM | POA: Insufficient documentation

## 2017-02-27 LAB — POC URINE PREG, ED: Preg Test, Ur: NEGATIVE

## 2017-02-27 MED ORDER — IBUPROFEN 800 MG PO TABS
800.0000 mg | ORAL_TABLET | Freq: Once | ORAL | Status: AC
  Administered 2017-02-27: 800 mg via ORAL
  Filled 2017-02-27: qty 1

## 2017-02-27 MED ORDER — NAPROXEN 500 MG PO TABS: 500.0000 mg | ORAL_TABLET | Freq: Two times a day (BID) | ORAL | 0 refills | Status: DC

## 2017-02-27 MED ORDER — METHOCARBAMOL 500 MG PO TABS: 500.0000 mg | ORAL_TABLET | Freq: Two times a day (BID) | ORAL | 0 refills | Status: DC

## 2017-02-27 NOTE — ED Notes (Signed)
PT DISCHARGED. INSTRUCTIONS AND PRESCRIPTIONS GIVEN. AAOX4. PT IN NO APPARENT DISTRESS. THE OPPORTUNITY TO ASK QUESTIONS WAS PROVIDED. 

## 2017-02-27 NOTE — Discharge Instructions (Signed)
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
## 2017-06-16 ENCOUNTER — Telehealth: Payer: Self-pay

## 2017-10-18 ENCOUNTER — Other Ambulatory Visit: Payer: Self-pay | Admitting: Family Medicine

## 2017-10-18 DIAGNOSIS — Z1231 Encounter for screening mammogram for malignant neoplasm of breast: Secondary | ICD-10-CM

## 2017-10-19 ENCOUNTER — Ambulatory Visit: Payer: 59

## 2017-11-16 ENCOUNTER — Ambulatory Visit
Admission: RE | Admit: 2017-11-16 | Discharge: 2017-11-16 | Disposition: A | Discharge status: Home / Self Care | Payer: 59 | Source: Ambulatory Visit | Attending: Family Medicine | Admitting: Family Medicine

## 2017-11-16 DIAGNOSIS — Z1231 Encounter for screening mammogram for malignant neoplasm of breast: Secondary | ICD-10-CM | POA: Diagnosis not present

## 2018-03-28 DIAGNOSIS — N951 Menopausal and female climacteric states: Secondary | ICD-10-CM | POA: Diagnosis not present

## 2018-03-28 DIAGNOSIS — R32 Unspecified urinary incontinence: Secondary | ICD-10-CM | POA: Diagnosis not present

## 2018-03-31 DIAGNOSIS — N760 Acute vaginitis: Secondary | ICD-10-CM | POA: Diagnosis not present

## 2018-03-31 DIAGNOSIS — N9089 Other specified noninflammatory disorders of vulva and perineum: Secondary | ICD-10-CM | POA: Diagnosis not present

## 2018-03-31 DIAGNOSIS — N939 Abnormal uterine and vaginal bleeding, unspecified: Secondary | ICD-10-CM | POA: Diagnosis not present

## 2018-05-06 DIAGNOSIS — N950 Postmenopausal bleeding: Secondary | ICD-10-CM | POA: Diagnosis not present

## 2018-06-10 ENCOUNTER — Other Ambulatory Visit: Payer: Self-pay | Admitting: Obstetrics & Gynecology

## 2018-06-10 DIAGNOSIS — Z3202 Encounter for pregnancy test, result negative: Secondary | ICD-10-CM | POA: Diagnosis not present

## 2018-06-10 DIAGNOSIS — N8501 Benign endometrial hyperplasia: Secondary | ICD-10-CM | POA: Diagnosis not present

## 2018-06-10 DIAGNOSIS — N939 Abnormal uterine and vaginal bleeding, unspecified: Secondary | ICD-10-CM | POA: Diagnosis not present

## 2019-01-18 ENCOUNTER — Other Ambulatory Visit: Payer: Self-pay | Admitting: Family Medicine

## 2019-01-18 DIAGNOSIS — Z1231 Encounter for screening mammogram for malignant neoplasm of breast: Secondary | ICD-10-CM

## 2019-01-27 DIAGNOSIS — M7731 Calcaneal spur, right foot: Secondary | ICD-10-CM | POA: Diagnosis not present

## 2019-01-27 DIAGNOSIS — B351 Tinea unguium: Secondary | ICD-10-CM | POA: Diagnosis not present

## 2019-01-27 DIAGNOSIS — L600 Ingrowing nail: Secondary | ICD-10-CM | POA: Diagnosis not present

## 2019-01-27 DIAGNOSIS — M7732 Calcaneal spur, left foot: Secondary | ICD-10-CM | POA: Diagnosis not present

## 2019-01-27 DIAGNOSIS — M722 Plantar fascial fibromatosis: Secondary | ICD-10-CM | POA: Diagnosis not present

## 2019-01-27 DIAGNOSIS — M79671 Pain in right foot: Secondary | ICD-10-CM | POA: Diagnosis not present

## 2019-01-27 DIAGNOSIS — B079 Viral wart, unspecified: Secondary | ICD-10-CM | POA: Diagnosis not present

## 2019-01-27 DIAGNOSIS — M79672 Pain in left foot: Secondary | ICD-10-CM | POA: Diagnosis not present

## 2019-02-10 DIAGNOSIS — M722 Plantar fascial fibromatosis: Secondary | ICD-10-CM | POA: Diagnosis not present

## 2019-02-10 DIAGNOSIS — D485 Neoplasm of uncertain behavior of skin: Secondary | ICD-10-CM | POA: Diagnosis not present

## 2019-02-14 ENCOUNTER — Ambulatory Visit
Admission: RE | Admit: 2019-02-14 | Discharge: 2019-02-14 | Disposition: A | Discharge status: Home / Self Care | Payer: 59 | Source: Ambulatory Visit | Attending: Family Medicine | Admitting: Family Medicine

## 2019-02-14 ENCOUNTER — Other Ambulatory Visit: Payer: Self-pay

## 2019-02-14 DIAGNOSIS — Z1231 Encounter for screening mammogram for malignant neoplasm of breast: Secondary | ICD-10-CM | POA: Diagnosis not present

## 2019-03-20 DIAGNOSIS — D485 Neoplasm of uncertain behavior of skin: Secondary | ICD-10-CM | POA: Diagnosis not present

## 2019-03-20 DIAGNOSIS — M722 Plantar fascial fibromatosis: Secondary | ICD-10-CM | POA: Diagnosis not present

## 2019-07-24 ENCOUNTER — Encounter (HOSPITAL_COMMUNITY): Payer: Self-pay | Admitting: Emergency Medicine

## 2019-07-24 ENCOUNTER — Inpatient Hospital Stay (HOSPITAL_COMMUNITY)
Admission: EM | Admit: 2019-07-24 | Discharge: 2019-07-30 | DRG: 871 | Disposition: A | Discharge status: Home / Self Care | Payer: 59 | Attending: Internal Medicine | Admitting: Internal Medicine

## 2019-07-24 ENCOUNTER — Emergency Department (HOSPITAL_COMMUNITY): Payer: 59

## 2019-07-24 ENCOUNTER — Other Ambulatory Visit: Payer: Self-pay

## 2019-07-24 DIAGNOSIS — J449 Chronic obstructive pulmonary disease, unspecified: Secondary | ICD-10-CM | POA: Diagnosis present

## 2019-07-24 DIAGNOSIS — Z20828 Contact with and (suspected) exposure to other viral communicable diseases: Secondary | ICD-10-CM | POA: Diagnosis present

## 2019-07-24 DIAGNOSIS — Z8701 Personal history of pneumonia (recurrent): Secondary | ICD-10-CM

## 2019-07-24 DIAGNOSIS — R51 Headache: Secondary | ICD-10-CM | POA: Diagnosis present

## 2019-07-24 DIAGNOSIS — J918 Pleural effusion in other conditions classified elsewhere: Secondary | ICD-10-CM | POA: Diagnosis present

## 2019-07-24 DIAGNOSIS — R519 Headache, unspecified: Secondary | ICD-10-CM

## 2019-07-24 DIAGNOSIS — D649 Anemia, unspecified: Secondary | ICD-10-CM | POA: Diagnosis present

## 2019-07-24 DIAGNOSIS — J9601 Acute respiratory failure with hypoxia: Secondary | ICD-10-CM

## 2019-07-24 DIAGNOSIS — R0602 Shortness of breath: Secondary | ICD-10-CM

## 2019-07-24 DIAGNOSIS — J181 Lobar pneumonia, unspecified organism: Secondary | ICD-10-CM | POA: Diagnosis present

## 2019-07-24 DIAGNOSIS — A419 Sepsis, unspecified organism: Principal | ICD-10-CM

## 2019-07-24 DIAGNOSIS — J432 Centrilobular emphysema: Secondary | ICD-10-CM | POA: Diagnosis present

## 2019-07-24 DIAGNOSIS — J189 Pneumonia, unspecified organism: Secondary | ICD-10-CM | POA: Diagnosis present

## 2019-07-24 DIAGNOSIS — R739 Hyperglycemia, unspecified: Secondary | ICD-10-CM | POA: Diagnosis present

## 2019-07-24 DIAGNOSIS — Z79899 Other long term (current) drug therapy: Secondary | ICD-10-CM

## 2019-07-24 DIAGNOSIS — F17210 Nicotine dependence, cigarettes, uncomplicated: Secondary | ICD-10-CM

## 2019-07-24 DIAGNOSIS — E876 Hypokalemia: Secondary | ICD-10-CM | POA: Diagnosis not present

## 2019-07-24 DIAGNOSIS — Z114 Encounter for screening for human immunodeficiency virus [HIV]: Secondary | ICD-10-CM

## 2019-07-24 LAB — COMPREHENSIVE METABOLIC PANEL
ALT: 21 U/L (ref 0–44)
AST: 30 U/L (ref 15–41)
Albumin: 2.9 g/dL — ABNORMAL LOW (ref 3.5–5.0)
Alkaline Phosphatase: 88 U/L (ref 38–126)
Anion gap: 12 (ref 5–15)
BUN: 14 mg/dL (ref 6–20)
CO2: 21 mmol/L — ABNORMAL LOW (ref 22–32)
Calcium: 8.4 mg/dL — ABNORMAL LOW (ref 8.9–10.3)
Chloride: 102 mmol/L (ref 98–111)
Creatinine, Ser: 0.81 mg/dL (ref 0.44–1.00)
GFR calc Af Amer: >60 mL/min (ref >60)
GFR calc non Af Amer: >60 mL/min (ref >60)
Glucose, Bld: 123 mg/dL — ABNORMAL HIGH (ref 70–99)
Potassium: 3.6 mmol/L (ref 3.5–5.1)
Sodium: 135 mmol/L (ref 135–145)
Total Bilirubin: 1 mg/dL (ref 0.3–1.2)
Total Protein: 8.7 g/dL — ABNORMAL HIGH (ref 6.5–8.1)

## 2019-07-24 LAB — PROTIME-INR
INR: 1.1 (ref 0.8–1.2)
Prothrombin Time: 14.1 seconds (ref 11.4–15.2)

## 2019-07-24 LAB — CBC WITH DIFFERENTIAL/PLATELET
Abs Immature Granulocytes: 0.07 10*3/uL (ref 0.00–0.07)
Basophils Absolute: 0 10*3/uL (ref 0.0–0.1)
Basophils Relative: 0 %
Eosinophils Absolute: 0 10*3/uL (ref 0.0–0.5)
Eosinophils Relative: 0 %
HCT: 34.9 % — ABNORMAL LOW (ref 36.0–46.0)
Hemoglobin: 10.8 g/dL — ABNORMAL LOW (ref 12.0–15.0)
Immature Granulocytes: 1 %
Lymphocytes Relative: 8 %
Lymphs Abs: 0.9 10*3/uL (ref 0.7–4.0)
MCH: 25.5 pg — ABNORMAL LOW (ref 26.0–34.0)
MCHC: 30.9 g/dL (ref 30.0–36.0)
MCV: 82.3 fL (ref 80.0–100.0)
Monocytes Absolute: 0.4 10*3/uL (ref 0.1–1.0)
Monocytes Relative: 4 %
Neutro Abs: 9.3 10*3/uL — ABNORMAL HIGH (ref 1.7–7.7)
Neutrophils Relative %: 87 %
Platelets: 394 10*3/uL (ref 150–400)
RBC: 4.24 MIL/uL (ref 3.87–5.11)
RDW: 17.6 % — ABNORMAL HIGH (ref 11.5–15.5)
WBC: 10.7 10*3/uL — ABNORMAL HIGH (ref 4.0–10.5)
nRBC: 0 % (ref 0.0–0.2)

## 2019-07-24 LAB — SARS CORONAVIRUS 2 BY RT PCR (HOSPITAL ORDER, PERFORMED IN ~~LOC~~ HOSPITAL LAB): SARS Coronavirus 2: NEGATIVE

## 2019-07-24 LAB — I-STAT BETA HCG BLOOD, ED (MC, WL, AP ONLY): I-stat hCG, quantitative: <5 m[IU]/mL (ref <5)

## 2019-07-24 LAB — TROPONIN I (HIGH SENSITIVITY): Troponin I (High Sensitivity): 12 ng/L (ref <18)

## 2019-07-24 LAB — APTT: aPTT: 44 seconds — ABNORMAL HIGH (ref 24–36)

## 2019-07-24 MED ORDER — VANCOMYCIN HCL IN DEXTROSE 1-5 GM/200ML-% IV SOLN
1000.0000 mg | Freq: Once | INTRAVENOUS | Status: AC
  Administered 2019-07-24: 23:00:00 1000 mg via INTRAVENOUS
  Filled 2019-07-24: qty 200

## 2019-07-24 MED ORDER — ACETAMINOPHEN 325 MG PO TABS
650.0000 mg | ORAL_TABLET | Freq: Once | ORAL | Status: AC | PRN
  Administered 2019-07-24: 21:00:00 650 mg via ORAL
  Filled 2019-07-24: qty 2

## 2019-07-24 MED ORDER — SODIUM CHLORIDE 0.9 % IV SOLN
2.0000 g | Freq: Once | INTRAVENOUS | Status: AC
  Administered 2019-07-24: 21:00:00 2 g via INTRAVENOUS
  Filled 2019-07-24: qty 2

## 2019-07-24 MED ORDER — SODIUM CHLORIDE 0.9 % IV BOLUS
30.0000 mL/kg | Freq: Once | INTRAVENOUS | Status: AC
  Administered 2019-07-24: 23:00:00 2178 mL via INTRAVENOUS

## 2019-07-24 MED ORDER — METRONIDAZOLE IN NACL 5-0.79 MG/ML-% IV SOLN
500.0000 mg | Freq: Once | INTRAVENOUS | Status: AC
  Administered 2019-07-24: 21:00:00 500 mg via INTRAVENOUS
  Filled 2019-07-24: qty 100

## 2019-07-24 NOTE — ED Provider Notes (Signed)
[facility] DEPT Provider Note   CSN: [id number] Arrival date & time: 07/24/19  1920     History   Chief Complaint Chief Complaint  Patient presents with  . Headache  . Cough  . Shortness of Breath    HPI Tammy Cordova is a 53 y.o. female who has a past medical history of pneumonia and is a current daily smoker.  She presents emergency department with chief complaint of shortness of breath.  Patient states that her symptoms began 2 days ago with what felt like reflux, burning esophageal pain and belching.  She began having a slight cough.  She had a few episodes of nonbloody nonbilious vomitus.  She began having shaking chills, headache, fatigue, malaise and has had worsening shortness of breath over the past 24 hours.  She only has a slight cough at this time.  She denies any urinary symptoms such as frequency, urgency or dysuria.  She denies abdominal pain, or diarrhea.     HPI  Past Medical History:  Diagnosis Date  . Pneumonia     There are no active problems to display for this patient.   Past Surgical History:  Procedure Laterality Date  . BREAST BIOPSY Left 04/2016   REACTIVE LYMPHOID HYPERPLASIA   . BUNIONECTOMY     bilateral     OB History   No obstetric history on file.      Home Medications    Prior to Admission medications   Medication Sig Start Date End Date Taking? Authorizing Provider  acetaminophen (TYLENOL) 325 MG tablet Take 650 mg by mouth every 6 (six) hours as needed for moderate pain or headache.   Yes [provider]  BEPREVE 1.5 % SOLN Place 1 drop into both eyes every 12 (twelve) hours as needed for allergies. 06/23/19  Yes [provider]  Cod Liver Oil CAPS Take 1 capsule by mouth daily.   Yes [provider]  ibuprofen (ADVIL,MOTRIN) 200 MG tablet Take 200 mg by mouth every 6 (six) hours as needed for pain (pain).   Yes [provider]  azithromycin (ZITHROMAX) 250 MG  tablet Take 1 tablet (250 mg total) by mouth daily. Take first 2 tablets together, then 1 every day until finished. Patient not taking: Reported on 07/24/2019 06/07/13   Varney Biles, MD  ibuprofen (ADVIL,MOTRIN) 600 MG tablet Take 1 tablet (600 mg total) by mouth every 6 (six) hours as needed for pain. Patient not taking: Reported on 07/24/2019 06/07/13   Varney Biles, MD  methocarbamol (ROBAXIN) 500 MG tablet Take 1 tablet (500 mg total) by mouth 2 (two) times daily. Patient not taking: Reported on 07/24/2019 02/27/17   Muthersbaugh, Jarrett Soho, PA-C  naproxen (NAPROSYN) 500 MG tablet Take 1 tablet (500 mg total) by mouth 2 (two) times daily with a meal. Patient not taking: Reported on 07/24/2019 02/27/17   Muthersbaugh, Jarrett Soho, PA-C    Family History Family History  Problem Relation Age of Onset  . Breast cancer Paternal Grandmother 59    Social History Social History   Tobacco Use  . Smoking status: Current Every Day Smoker    Packs/day: 0.50    Types: Cigarettes  . Smokeless tobacco: Never Used  Substance Use Topics  . Alcohol use: No  . Drug use: Yes    Types: Marijuana     Allergies   Patient has no known allergies.   Review of Systems Review of Systems Ten systems reviewed and are negative for acute change, except  as noted in the HPI.    Physical Exam Updated Vital Signs BP 137/73   Pulse (!) 120   Temp (!) 102.7 F (39.3 C) (Oral)   Resp (!) 33   Ht 5\' 7"  (1.702 m)   Wt 72.6 kg   SpO2 93%   BMI 25.06 kg/m   Physical Exam Vitals signs and nursing note reviewed.  Constitutional:      Appearance: She is well-developed. She is ill-appearing and toxic-appearing.  HENT:     Head: Normocephalic and atraumatic.     Mouth/Throat:     Mouth: Mucous membranes are moist.  Eyes:     Extraocular Movements: Extraocular movements intact.     Pupils: Pupils are equal, round, and reactive to light.  Neck:     Musculoskeletal: Normal range of motion.     Vascular: No  JVD.  Pulmonary:     Effort: Tachypnea present.     Breath sounds: Decreased breath sounds present. No wheezing.  Abdominal:     General: Abdomen is flat. There is no distension.     Palpations: Abdomen is soft.  Neurological:     Mental Status: She is alert and oriented to person, place, and time.      ED Treatments / Results  Labs (all labs ordered are listed, but only abnormal results are displayed) Labs Reviewed  SARS CORONAVIRUS 2 ([facility])  CULTURE, BLOOD (ROUTINE X 2)  CULTURE, BLOOD (ROUTINE X 2)  URINE CULTURE  LACTIC ACID, PLASMA  LACTIC ACID, PLASMA  COMPREHENSIVE METABOLIC PANEL  CBC WITH DIFFERENTIAL/PLATELET  APTT  PROTIME-INR  URINALYSIS, ROUTINE W REFLEX MICROSCOPIC  I-STAT BETA HCG BLOOD, ED ([facility], [facility], [facility] ONLY)  TROPONIN I (HIGH SENSITIVITY)    EKG None  Radiology No results found.  Procedures Procedures (including critical care time)  Medications Ordered in ED Medications  acetaminophen (TYLENOL) tablet 650 mg (has no administration in time range)  ceFEPIme (MAXIPIME) 2 g in sodium chloride 0.9 % 100 mL IVPB (has no administration in time range)  metroNIDAZOLE (FLAGYL) IVPB 500 mg (has no administration in time range)  vancomycin (VANCOCIN) IVPB 1000 mg/200 mL premix (has no administration in time range)  sodium chloride 0.9 % bolus 2,178 mL (has no administration in time range)     Initial Impression / Assessment and Plan / ED Course  I have reviewed the triage vital signs and the nursing notes.  Pertinent labs & imaging results that were available during my care of the patient were reviewed by me and considered in my medical decision making (see chart for details).        CC: Shortness of breath VS:  Vitals:   07/24/19 1937 07/24/19 2000 07/24/19 2030 07/24/19 2246  BP: 136/82 137/79 137/73 125/75  Pulse: (!) 131 (!) 124 (!) 120 (!) 110  Resp: (!) 25 (!) 33 (!) 33 (!) 26  Temp: (!) 102.7  F (39.3 C)   100.2 F (37.9 C)  TempSrc: Oral   Oral  SpO2: 94% 91% 93% 94%  Weight: 72.6 kg     Height: 5\' 7"  (1.702 m)       [id number] is gathered by patient and EMR. DDX:The emergent differential diagnosis for shortness of breath includes, but is not limited to, Pulmonary edema, bronchoconstriction, Pneumonia, Pulmonary embolism, Pneumotherax/ Hemothorax, Dysrythmia, ACS.  Labs: I reviewed the labs which show leukocytosis, troponin not elevated.  Negative pregnancy test, lactic acid is pending.  Blood cultures are  pending.  CMP shows a mild hyperglycemia.  Patient's coronavirus test is negative Imaging: I personally reviewed the images (portable 1 view chest x-ray ) which show(s) left lower lobe pneumonia EKG:  EKG Interpretation  Date/Time:  Monday July 24 2019 19:41:27 EDT Ventricular Rate:  133 PR Interval:  138 QRS Duration: 72 QT Interval:  286 QTC Calculation: 425 R Axis:   22 Text Interpretation:  Sinus tachycardia Abnormal ekg Confirmed by Carmin Muskrat [phone]) on 07/24/2019 9:40:33 PM      MDM: Patient with a community-acquired pneumonia.  Negative coronavirus test.  Treated for sepsis with broad-spectrum antibiotics and fluids.  She is given full 30 mL/kg fluid resuscitation.  Patient oxygen saturations have been above 90%.  She will be admitted by the hospitalist service. Patient disposition: Admit Patient condition: Stable. The patient appears reasonably stabilized for admission considering the current resources, flow, and capabilities available in the ED at this time, and I doubt any other [facility] requiring further screening and/or treatment in the ED prior to admission.  Danny Poncedeleon was evaluated in Emergency Department on 07/25/2019 for the symptoms described in the history of present illness. She was evaluated in the context of the global COVID-19 pandemic, which necessitated consideration that the patient might be at risk for infection with the SARS-CoV-2 virus  that causes COVID-19. Institutional protocols and algorithms that pertain to the evaluation of patients at risk for COVID-19 are in a state of rapid change based on information released by regulatory bodies including the CDC and federal and state organizations. These policies and algorithms were followed during the patient's care in the ED.  Final Clinical Impressions(s) / ED Diagnoses   Final diagnoses:  SOB (shortness of breath)    ED Discharge Orders    None       Margarita Mail, PA-C 07/25/19 0101    Carmin Muskrat, MD 07/25/19 [phone]

## 2019-07-24 NOTE — ED Triage Notes (Signed)
Patient here from home with complaints of headache and cough that started Saturday.

## 2019-07-25 ENCOUNTER — Other Ambulatory Visit: Payer: Self-pay

## 2019-07-25 ENCOUNTER — Encounter (HOSPITAL_COMMUNITY): Payer: Self-pay

## 2019-07-25 DIAGNOSIS — E876 Hypokalemia: Secondary | ICD-10-CM | POA: Diagnosis not present

## 2019-07-25 DIAGNOSIS — J9601 Acute respiratory failure with hypoxia: Secondary | ICD-10-CM | POA: Diagnosis not present

## 2019-07-25 DIAGNOSIS — F17210 Nicotine dependence, cigarettes, uncomplicated: Secondary | ICD-10-CM

## 2019-07-25 DIAGNOSIS — D649 Anemia, unspecified: Secondary | ICD-10-CM | POA: Diagnosis present

## 2019-07-25 DIAGNOSIS — A419 Sepsis, unspecified organism: Secondary | ICD-10-CM | POA: Diagnosis present

## 2019-07-25 DIAGNOSIS — J918 Pleural effusion in other conditions classified elsewhere: Secondary | ICD-10-CM | POA: Diagnosis present

## 2019-07-25 DIAGNOSIS — J181 Lobar pneumonia, unspecified organism: Secondary | ICD-10-CM | POA: Diagnosis not present

## 2019-07-25 DIAGNOSIS — Z8701 Personal history of pneumonia (recurrent): Secondary | ICD-10-CM | POA: Diagnosis not present

## 2019-07-25 DIAGNOSIS — Z114 Encounter for screening for human immunodeficiency virus [HIV]: Secondary | ICD-10-CM

## 2019-07-25 DIAGNOSIS — R0602 Shortness of breath: Secondary | ICD-10-CM | POA: Diagnosis present

## 2019-07-25 DIAGNOSIS — Z79899 Other long term (current) drug therapy: Secondary | ICD-10-CM | POA: Diagnosis not present

## 2019-07-25 DIAGNOSIS — R51 Headache: Secondary | ICD-10-CM | POA: Diagnosis present

## 2019-07-25 DIAGNOSIS — R739 Hyperglycemia, unspecified: Secondary | ICD-10-CM | POA: Diagnosis present

## 2019-07-25 DIAGNOSIS — J432 Centrilobular emphysema: Secondary | ICD-10-CM | POA: Diagnosis not present

## 2019-07-25 DIAGNOSIS — R519 Headache, unspecified: Secondary | ICD-10-CM

## 2019-07-25 DIAGNOSIS — Z720 Tobacco use: Secondary | ICD-10-CM

## 2019-07-25 DIAGNOSIS — Z20828 Contact with and (suspected) exposure to other viral communicable diseases: Secondary | ICD-10-CM | POA: Diagnosis present

## 2019-07-25 DIAGNOSIS — J189 Pneumonia, unspecified organism: Secondary | ICD-10-CM | POA: Diagnosis present

## 2019-07-25 LAB — CBC
HCT: 31.8 % — ABNORMAL LOW (ref 36.0–46.0)
Hemoglobin: 9.6 g/dL — ABNORMAL LOW (ref 12.0–15.0)
MCH: 25.1 pg — ABNORMAL LOW (ref 26.0–34.0)
MCHC: 30.2 g/dL (ref 30.0–36.0)
MCV: 83 fL (ref 80.0–100.0)
Platelets: 347 10*3/uL (ref 150–400)
RBC: 3.83 MIL/uL — ABNORMAL LOW (ref 3.87–5.11)
RDW: 17.6 % — ABNORMAL HIGH (ref 11.5–15.5)
WBC: 7.9 10*3/uL (ref 4.0–10.5)
nRBC: 0 % (ref 0.0–0.2)

## 2019-07-25 LAB — URINALYSIS, ROUTINE W REFLEX MICROSCOPIC
Bilirubin Urine: NEGATIVE
Glucose, UA: NEGATIVE mg/dL
Ketones, ur: NEGATIVE mg/dL
Nitrite: NEGATIVE
Protein, ur: 30 mg/dL — AB
Specific Gravity, Urine: 1.017 (ref 1.005–1.030)
pH: 5 (ref 5.0–8.0)

## 2019-07-25 LAB — LACTIC ACID, PLASMA: Lactic Acid, Venous: 0.7 mmol/L (ref 0.5–1.9)

## 2019-07-25 LAB — HIV ANTIBODY (ROUTINE TESTING W REFLEX): HIV Screen 4th Generation wRfx: NONREACTIVE

## 2019-07-25 MED ORDER — SODIUM CHLORIDE 0.9 % IV SOLN
2.0000 g | INTRAVENOUS | Status: AC
  Administered 2019-07-25 – 2019-07-29 (×5): 2 g via INTRAVENOUS
  Filled 2019-07-25: qty 20
  Filled 2019-07-25 (×5): qty 2

## 2019-07-25 MED ORDER — ACETAMINOPHEN 325 MG PO TABS
650.0000 mg | ORAL_TABLET | Freq: Four times a day (QID) | ORAL | Status: DC | PRN
  Administered 2019-07-25 (×2): 650 mg via ORAL
  Filled 2019-07-25: qty 2

## 2019-07-25 MED ORDER — SODIUM CHLORIDE 0.9 % IV SOLN
500.0000 mg | Freq: Every day | INTRAVENOUS | Status: AC
  Administered 2019-07-25 – 2019-07-29 (×5): 500 mg via INTRAVENOUS
  Filled 2019-07-25 (×5): qty 500

## 2019-07-25 MED ORDER — ACETAMINOPHEN 325 MG PO TABS
325.0000 mg | ORAL_TABLET | Freq: Once | ORAL | Status: AC
  Administered 2019-07-25: 325 mg via ORAL
  Filled 2019-07-25: qty 1

## 2019-07-25 MED ORDER — NICOTINE 14 MG/24HR TD PT24
14.0000 mg | MEDICATED_PATCH | Freq: Every day | TRANSDERMAL | Status: DC
  Administered 2019-07-25 – 2019-07-30 (×6): 14 mg via TRANSDERMAL
  Filled 2019-07-25 (×7): qty 1

## 2019-07-25 MED ORDER — SODIUM CHLORIDE 0.9 % IV SOLN
INTRAVENOUS | Status: DC
  Administered 2019-07-26: 05:00:00 via INTRAVENOUS

## 2019-07-25 MED ORDER — ACETAMINOPHEN 500 MG PO TABS
1000.0000 mg | ORAL_TABLET | Freq: Four times a day (QID) | ORAL | Status: DC | PRN
  Administered 2019-07-27: 22:00:00 1000 mg via ORAL
  Filled 2019-07-25: qty 2

## 2019-07-25 MED ORDER — SODIUM CHLORIDE 0.9 % IV SOLN
INTRAVENOUS | Status: AC
  Administered 2019-07-25 (×2): via INTRAVENOUS

## 2019-07-25 MED ORDER — ACETAMINOPHEN 325 MG PO TABS
650.0000 mg | ORAL_TABLET | Freq: Once | ORAL | Status: DC | PRN
  Filled 2019-07-25: qty 2

## 2019-07-25 MED ORDER — ENOXAPARIN SODIUM 40 MG/0.4ML ~~LOC~~ SOLN
40.0000 mg | SUBCUTANEOUS | Status: DC
  Administered 2019-07-25 – 2019-07-30 (×6): 40 mg via SUBCUTANEOUS
  Filled 2019-07-25 (×6): qty 0.4

## 2019-07-25 NOTE — Sepsis Progress Note (Signed)
No results as of yet. It should be noted that patient has already received fluid bolus and antibiotics. Original Lactic acid was drawn 21:54.

## 2019-07-25 NOTE — H&P (Signed)
History and Physical    Tammy Cordova [id number] DOB: October 13, 1966 DOA: 07/24/2019  PCP: Kelton Pillar, MD Patient coming from: Home  Chief Complaint: Cough, shortness of breath  HPI: Tammy Cordova is a 53 y.o. female with medical history significant of pneumonia in 2014 presenting to the hospital with complaints of cough and shortness of breath.  Patient states for the past 3 days she is having fatigue, cough productive of clear sputum, and shortness of breath.  She is also having chills.  She is having slight frontal headaches since her symptoms started 3 days ago.  No change in vision or focal weakness/numbness.  No other complaints.  ED Course: Temperature 102.7 F, tachycardic, and tachypneic.  Blood pressure stable.  SPO2 91-94% on room air.  COVID-19 rapid test negative.  White count 10.7.  Hemoglobin 10.8, was 11.0 in July 2014.  Blood culture x2 pending.  Lactic acid level pending.  UA and urine culture pending.  High-sensitivity troponin negative.  Beta-hCG negative.  Chest x-ray showing left lower lobe airspace disease suspicious for pneumonia.  Patient received Tylenol, vancomycin, cefepime, metronidazole, and 2.178 mL fluid boluses based on sepsis protocol.  Review of Systems:  All systems reviewed and apart from history of presenting illness, are negative.  Past Medical History:  Diagnosis Date  . Pneumonia     Past Surgical History:  Procedure Laterality Date  . BREAST BIOPSY Left 04/2016   REACTIVE LYMPHOID HYPERPLASIA   . BUNIONECTOMY     bilateral     reports that she has been smoking cigarettes. She has been smoking about 0.50 packs per day. She has never used smokeless tobacco. She reports current drug use. Drug: Marijuana. She reports that she does not drink alcohol.  No Known Allergies  Family History  Problem Relation Age of Onset  . Breast cancer Paternal Grandmother 47    Prior to Admission medications   Medication Sig Start Date End Date Taking?  Authorizing Provider  acetaminophen (TYLENOL) 325 MG tablet Take 650 mg by mouth every 6 (six) hours as needed for moderate pain or headache.   Yes [provider]  BEPREVE 1.5 % SOLN Place 1 drop into both eyes every 12 (twelve) hours as needed for allergies. 06/23/19  Yes [provider]  Cod Liver Oil CAPS Take 1 capsule by mouth daily.   Yes [provider]  ibuprofen (ADVIL,MOTRIN) 200 MG tablet Take 200 mg by mouth every 6 (six) hours as needed for pain (pain).   Yes [provider]  azithromycin (ZITHROMAX) 250 MG tablet Take 1 tablet (250 mg total) by mouth daily. Take first 2 tablets together, then 1 every day until finished. Patient not taking: Reported on 07/24/2019 06/07/13   Varney Biles, MD  ibuprofen (ADVIL,MOTRIN) 600 MG tablet Take 1 tablet (600 mg total) by mouth every 6 (six) hours as needed for pain. Patient not taking: Reported on 07/24/2019 06/07/13   Varney Biles, MD  methocarbamol (ROBAXIN) 500 MG tablet Take 1 tablet (500 mg total) by mouth 2 (two) times daily. Patient not taking: Reported on 07/24/2019 02/27/17   Muthersbaugh, Jarrett Soho, PA-C  naproxen (NAPROSYN) 500 MG tablet Take 1 tablet (500 mg total) by mouth 2 (two) times daily with a meal. Patient not taking: Reported on 07/24/2019 02/27/17   Muthersbaugh, Jarrett Soho, PA-C    Physical Exam: Vitals:   07/24/19 1937 07/24/19 2000 07/24/19 2030 07/24/19 2246  BP: 136/82 137/79 137/73 125/75  Pulse: (!) 131 (!) 124 (!) 120 (!) 110  Resp: (!) 25 (!) 33 (!) 33 (!) 26  Temp: (!) 102.7 F (39.3 C)   100.2 F (37.9 C)  TempSrc: Oral   Oral  SpO2: 94% 91% 93% 94%  Weight: 72.6 kg     Height: 5\' 7"  (1.702 m)       Physical Exam  Constitutional: She is oriented to person, place, and time. She appears well-developed and well-nourished. No distress.  Watching television  HENT:  Head: Normocephalic.  Eyes: Pupils are equal, round, and reactive to light. EOM are normal. Right eye exhibits no  discharge. Left eye exhibits no discharge.  Neck: Neck supple.  Cardiovascular: Normal rate, regular rhythm and intact distal pulses.  Pulmonary/Chest: Effort normal. She has no wheezes.  Slightly reduced breath sounds and rales at the left lung base SPO2 93-94% on room air  Abdominal: Soft. Bowel sounds are normal. She exhibits no distension. There is no abdominal tenderness. There is no guarding.  Musculoskeletal:        General: No edema.  Neurological: She is alert and oriented to person, place, and time.  Strength 5/5 in bilateral upper and lower extremities. Sensation to light touch intact throughout.  Skin: Skin is warm and dry. She is not diaphoretic.     Labs on Admission: I have personally reviewed following labs and imaging studies  CBC: Recent Labs  Lab 07/24/19 1954  WBC 10.7*  NEUTROABS 9.3*  HGB 10.8*  HCT 34.9*  MCV 82.3  PLT [id number]   Basic Metabolic Panel: Recent Labs  Lab 07/24/19 1954  NA 135  K 3.6  CL 102  CO2 21*  GLUCOSE 123*  BUN 14  CREATININE 0.81  CALCIUM 8.4*   GFR: Estimated Creatinine Clearance: 79 mL/min (by C-G formula based on SCr of 0.81 mg/dL). Liver Function Tests: Recent Labs  Lab 07/24/19 1954  AST 30  ALT 21  ALKPHOS 88  BILITOT 1.0  PROT 8.7*  ALBUMIN 2.9*   No results for input(s): LIPASE, AMYLASE in the last 168 hours. No results for input(s): AMMONIA in the last 168 hours. Coagulation Profile: Recent Labs  Lab 07/24/19 1954  INR 1.1   Cardiac Enzymes: No results for input(s): CKTOTAL, CKMB, CKMBINDEX, TROPONINI in the last 168 hours. BNP (last 3 results) No results for input(s): PROBNP in the last 8760 hours. HbA1C: No results for input(s): HGBA1C in the last 72 hours. CBG: No results for input(s): GLUCAP in the last 168 hours. Lipid Profile: No results for input(s): CHOL, HDL, LDLCALC, TRIG, CHOLHDL, LDLDIRECT in the last 72 hours. Thyroid Function Tests: No results for input(s): TSH, T4TOTAL, FREET4,  T3FREE, THYROIDAB in the last 72 hours. Anemia Panel: No results for input(s): VITAMINB12, FOLATE, FERRITIN, TIBC, IRON, RETICCTPCT in the last 72 hours. Urine analysis:    Component Value Date/Time   COLORURINE YELLOW 01/25/2013 0823   APPEARANCEUR CLEAR 01/25/2013 0823   LABSPEC >1.046 (H) 01/25/2013 0823   PHURINE 8.5 (H) 01/25/2013 0823   GLUCOSEU NEGATIVE 01/25/2013 0823   HGBUR NEGATIVE 01/25/2013 0823   BILIRUBINUR NEGATIVE 01/25/2013 0823   KETONESUR NEGATIVE 01/25/2013 0823   PROTEINUR NEGATIVE 01/25/2013 0823   UROBILINOGEN 0.2 01/25/2013 0823   NITRITE NEGATIVE 01/25/2013 0823   LEUKOCYTESUR NEGATIVE 01/25/2013 0823    Radiological Exams on Admission: Dg Chest Port 1 View  Result Date: 07/24/2019 CLINICAL DATA:  Headache and cough, short of breath EXAM: PORTABLE CHEST 1 VIEW COMPARISON:  06/07/2013 FINDINGS: Left lower lobe airspace disease, suspicious for pneumonia. Right lung clear.  Negative for heart failure or effusion. IMPRESSION: Left lower lobe airspace disease, suspicious for pneumonia. Electronically Signed   By: Franchot Gallo M.D.   On: 07/24/2019 21:19    EKG: Independently reviewed.  Sinus tachycardia, heart rate 133.  Assessment/Plan Principal Problem:   CAP (community acquired pneumonia) Active Problems:   Sepsis ([place])   Encounter for screening for HIV   Tobacco use   Headache   Sepsis secondary to CAP Temperature 102.7 F, tachycardic, and tachypneic on arrival.  Heart rate now improved after IV fluid resuscitation.  SPO2 93-94% on room air at present.  Blood pressure stable. COVID-19 rapid test negative.  White count 10.7. Chest x-ray showing left lower lobe airspace disease suspicious for pneumonia. -IV fluid resuscitation -Received vancomycin, cefepime, and metronidazole in the ED.  Continue antibiotic coverage with ceftriaxone and azithromycin. -Tylenol PRN -Blood culture x2 pending -Lactic acid level pending -Continuous pulse ox,  supplemental oxygen if needed  Tobacco use -Counseling -NicoDerm patch  Mild frontal headaches Likely tension headaches.  No change in vision or focal neuro deficit. -Tylenol PRN.  Continue to monitor  HIV screening The patient falls between the ages of 13-64 and should be screened for HIV, therefore HIV testing ordered.  DVT prophylaxis: Lovenox Code Status: Full code Family Communication: No family available at this time. Disposition Plan: Anticipate discharge after clinical improvement. Consults called: None Admission status: It is my clinical opinion that referral for OBSERVATION is reasonable and necessary in this patient based on the above information provided. The aforementioned taken together are felt to place the patient at high risk for further clinical deterioration. However it is anticipated that the patient may be medically stable for discharge from the hospital within 24 to 48 hours.  The medical decision making on this patient was of high complexity and the patient is at high risk for clinical deterioration, therefore this is a level 3 visit.  Shela Leff MD [facility] Pager [phone]  If 7PM-7AM, please contact night-coverage [email or web address] Password [email or web address]  07/25/2019, 1:14 AM

## 2019-07-25 NOTE — Progress Notes (Signed)
PROGRESS NOTE    Tammy Cordova  [id number] DOB: 1966-01-16 DOA: 07/24/2019 PCP: Kelton Pillar, MD   Brief Narrative:   Tammy Cordova is a 53 y.o. female with medical history significant of pneumonia in 2014 presenting to the hospital with complaints of cough and shortness of breath.  Patient states for the past 3 days she is having fatigue, cough productive of clear sputum, and shortness of breath.  She is also having chills.  She is having slight frontal headaches since her symptoms started 3 days ago.  No change in vision or focal weakness/numbness.  No other complaints.   Assessment & Plan:   Principal Problem:   CAP (community acquired pneumonia) Active Problems:   Sepsis ([place])   Encounter for screening for HIV   Tobacco use   Headache   Sepsis secondary to CAP Tmax 102.7 F, tachycardic, and tachypneic on arrival.  Heart rate now improved after IV fluid resuscitation.  SPO2 93-94% on room air at present.  Blood pressure stable. COVID-19 rapid test negative.  White count now 7.9, . Chest x-ray showing left lower lobe airspace disease suspicious for pneumonia. -c/w IV fluid resuscitation -Received vancomycin, cefepime, and metronidazole in the ED.  Continue antibiotic coverage with ceftriaxone and azithromycin. -Tylenol PRN -Blood culture x2 remain  pending -Lactic acid level 0.7 on admit -Continuous pulse ox, supplemental oxygen if needed  Tobacco use -Counseling -NicoDerm patch  Mild frontal headaches Likely tension headaches.  No change in vision or focal neuro deficit. -Tylenol PRN.  Continue to monitor  HIV screening The patient falls between the ages of 13-64 and should be screened for HIV,  HIV testing ordered on admit-remains pending   DVT prophylaxis: Lovenox SQ  Code Status: Full    Code Status Orders  (From admission, onward)         Start     Ordered   07/25/19 0059  Full code  Continuous     07/25/19 0101        Code Status History     This patient has a current code status but no historical code status.   Advance Care Planning Activity     Family Communication: none today  Disposition Plan:   Patient changed to inpatient status.  We will continue with IV antibiotics, follow-up blood cultures, frequent nurse intervention, frequent lab monitoring and respiratory support. Consults called: None Admission status: Inpatient   Consultants:   None  Procedures:  Dg Chest Port 1 View  Result Date: 07/24/2019 CLINICAL DATA:  Headache and cough, short of breath EXAM: PORTABLE CHEST 1 VIEW COMPARISON:  06/07/2013 FINDINGS: Left lower lobe airspace disease, suspicious for pneumonia. Right lung clear. Negative for heart failure or effusion. IMPRESSION: Left lower lobe airspace disease, suspicious for pneumonia. Electronically Signed   By: Franchot Gallo M.D.   On: 07/24/2019 21:19     Antimicrobials:   Ceftriaxone and azithromycin day #2  Received 1 dose of vancomycin and cefepime.   Subjective: Patient made overnight secondary to pneumonia and fevers Reports still feels short of breath this morning No significant change overnight  Objective: Vitals:   07/25/19 0200 07/25/19 0230 07/25/19 1100 07/25/19 1236  BP: (!) 151/75 (!) 147/77  (!) 150/80  Pulse: 94 93  (!) 103  Resp: (!) 25 12  18   Temp:    (!) 101.6 F (38.7 C)  TempSrc:    Oral  SpO2: 94% 93% 95% 97%  Weight:      Height:  Intake/Output Summary (Last 24 hours) at 07/25/2019 1332 Last data filed at 07/25/2019 1000 Gross per 24 hour  Intake 3876.35 ml  Output -  Net 3876.35 ml   Filed Weights   07/24/19 1937  Weight: 72.6 kg    Examination:  General exam: No hemodynamic instability, still feels short of breath Respiratory system: Rhonchi bilaterally, no wheezing no accessory muscle use Cardiovascular system: Sinus tachycardia no murmurs noted Gastrointestinal system: Abdomen is nondistended, soft and nontender. No organomegaly or  masses felt. Normal bowel sounds heard. Central nervous system: Alert and oriented. No focal neurological deficits. Extremities: Warm well perfused, neurovascularly intact Skin: No rashes, lesions or ulcers Psychiatry: Judgement and insight appear normal. Mood & affect appropriate.     Data Reviewed: I have personally reviewed following labs and imaging studies  CBC: Recent Labs  Lab 07/24/19 1954 07/25/19 0523  WBC 10.7* 7.9  NEUTROABS 9.3*  --   HGB 10.8* 9.6*  HCT 34.9* 31.8*  MCV 82.3 83.0  PLT 394 [id number]   Basic Metabolic Panel: Recent Labs  Lab 07/24/19 1954  NA 135  K 3.6  CL 102  CO2 21*  GLUCOSE 123*  BUN 14  CREATININE 0.81  CALCIUM 8.4*   GFR: Estimated Creatinine Clearance: 79 mL/min (by C-G formula based on SCr of 0.81 mg/dL). Liver Function Tests: Recent Labs  Lab 07/24/19 1954  AST 30  ALT 21  ALKPHOS 88  BILITOT 1.0  PROT 8.7*  ALBUMIN 2.9*   No results for input(s): LIPASE, AMYLASE in the last 168 hours. No results for input(s): AMMONIA in the last 168 hours. Coagulation Profile: Recent Labs  Lab 07/24/19 1954  INR 1.1   Cardiac Enzymes: No results for input(s): CKTOTAL, CKMB, CKMBINDEX, TROPONINI in the last 168 hours. BNP (last 3 results) No results for input(s): PROBNP in the last 8760 hours. HbA1C: No results for input(s): HGBA1C in the last 72 hours. CBG: No results for input(s): GLUCAP in the last 168 hours. Lipid Profile: No results for input(s): CHOL, HDL, LDLCALC, TRIG, CHOLHDL, LDLDIRECT in the last 72 hours. Thyroid Function Tests: No results for input(s): TSH, T4TOTAL, FREET4, T3FREE, THYROIDAB in the last 72 hours. Anemia Panel: No results for input(s): VITAMINB12, FOLATE, FERRITIN, TIBC, IRON, RETICCTPCT in the last 72 hours. Sepsis Labs: Recent Labs  Lab 07/24/19 2154  LATICACIDVEN 0.7    Recent Results (from the past 240 hour(s))  SARS Coronavirus 2 [facility] order, Performed in [facility] hospital lab)  Nasopharyngeal Nasopharyngeal Swab     Status: None   Collection Time: 07/24/19  7:52 PM   Specimen: Nasopharyngeal Swab  Result Value Ref Range Status   SARS Coronavirus 2 NEGATIVE NEGATIVE Final    Comment: (NOTE) If result is NEGATIVE SARS-CoV-2 target nucleic acids are NOT DETECTED. The SARS-CoV-2 RNA is generally detectable in upper and lower  respiratory specimens during the acute phase of infection. The lowest  concentration of SARS-CoV-2 viral copies this assay can detect is 250  copies / mL. A negative result does not preclude SARS-CoV-2 infection  and should not be used as the sole basis for treatment or other  patient management decisions.  A negative result may occur with  improper specimen collection / handling, submission of specimen other  than nasopharyngeal swab, presence of viral mutation(s) within the  areas targeted by this assay, and inadequate number of viral copies  (<250 copies / mL). A negative result must be combined with clinical  observations, patient history, and epidemiological information. If  result is POSITIVE SARS-CoV-2 target nucleic acids are DETECTED. The SARS-CoV-2 RNA is generally detectable in upper and lower  respiratory specimens dur ing the acute phase of infection.  Positive  results are indicative of active infection with SARS-CoV-2.  Clinical  correlation with patient history and other diagnostic information is  necessary to determine patient infection status.  Positive results do  not rule out bacterial infection or co-infection with other viruses. If result is PRESUMPTIVE POSTIVE SARS-CoV-2 nucleic acids MAY BE PRESENT.   A presumptive positive result was obtained on the submitted specimen  and confirmed on repeat testing.  While 2019 novel coronavirus  (SARS-CoV-2) nucleic acids may be present in the submitted sample  additional confirmatory testing may be necessary for epidemiological  and / or clinical management purposes  to  differentiate between  SARS-CoV-2 and other Sarbecovirus currently known to infect humans.  If clinically indicated additional testing with an alternate test  methodology [phone]) is advised. The SARS-CoV-2 RNA is generally  detectable in upper and lower respiratory sp ecimens during the acute  phase of infection. The expected result is Negative. Fact Sheet for Patients:  StrictlyIdeas.no Fact Sheet for Healthcare Providers: BankingDealers.co.za This test is not yet approved or cleared by the [place] FDA and has been authorized for detection and/or diagnosis of SARS-CoV-2 by FDA under an Emergency Use Authorization (EUA).  This EUA will remain in effect (meaning this test can be used) for the duration of the COVID-19 declaration under Section 564(b)(1) of the Act, 21 U.S.C. section 360bbb-3(b)(1), unless the authorization is terminated or revoked sooner. Performed at [facility], [place] [address], [place], [place] [address]   Blood Culture (routine x 2)     Status: None (Preliminary result)   Collection Time: 07/24/19  7:54 PM   Specimen: BLOOD  Result Value Ref Range Status   Specimen Description   Final    BLOOD LEFT ANTECUBITAL Performed at [facility] [address], [place], [place] [address]    Special Requests   Final    Blood Culture adequate volume BOTTLES DRAWN AEROBIC AND ANAEROBIC Performed at [facility] [address] [place], [place] [address]    Culture   Final    NO GROWTH < 12 HOURS Performed at [facility] [address] [place], [place] [address]    Report Status PENDING  Incomplete  Blood Culture (routine x 2)     Status: None (Preliminary result)   Collection Time: 07/24/19  7:59 PM   Specimen: BLOOD  Result Value Ref Range Status   Specimen Description   Final    BLOOD BLOOD LEFT WRIST Performed at [facility] [address] [place], [place] [address]    Special Requests   Final    BOTTLES DRAWN AEROBIC AND ANAEROBIC Blood Culture results may not be optimal due to an inadequate volume of blood received in culture bottles Performed at [facility] [address], [place], [place] [address]    Culture   Final    NO GROWTH < 12 HOURS Performed at [facility] [address] [place],  [address]    Report Status PENDING  Incomplete         Radiology Studies: Dg Chest Port 1 View  Result Date: 07/24/2019 CLINICAL DATA:  Headache and cough, short of breath EXAM: PORTABLE CHEST 1 VIEW COMPARISON:  06/07/2013 FINDINGS: Left lower lobe airspace disease, suspicious for  pneumonia. Right lung clear. Negative for heart failure or effusion. IMPRESSION: Left lower lobe airspace disease, suspicious for pneumonia. Electronically Signed   By: Franchot Gallo M.D.   On: 07/24/2019 21:19        Scheduled Meds: . enoxaparin (LOVENOX) injection  40 mg Subcutaneous Q24H  . nicotine  14 mg Transdermal Daily   Continuous Infusions: . azithromycin Stopped (07/25/19 0558)  . cefTRIAXone (ROCEPHIN)  IV 2 g (07/25/19 1107)     LOS: 0 days    Time spent: 81 min    Nicolette Bang, MD [facility]  If 7PM-7AM, please contact night-coverage  07/25/2019, 1:32 PM

## 2019-07-25 NOTE — Sepsis Progress Note (Signed)
I have spoken with Mortimer Fries RN in regards to Lactic Acid. It was drawn @ 20:40 according to ED timeline and has not resulted yet. Called Lab and was informed it was in process.

## 2019-07-25 NOTE — Sepsis Progress Note (Signed)
  Spoke with Mortimer Fries RN again and was told that the Lab told him the lactic acid tube was not labeled and he needed to draw another one. Another specimen was drawn and sent to the lab.

## 2019-07-25 NOTE — Sepsis Progress Note (Signed)
Spoke with Mortimer Fries RN who is also very frustrated in regards to this Lactic Acid. Lab states it is in Process and also noted on ED timeline in process and prior orders cancelled by lab.

## 2019-07-25 NOTE — ED Notes (Signed)
ED TO INPATIENT HANDOFF REPORT  ED Nurse Name and Phone #: [phone]  S Name/Age/Gender Bertha Stakes 53 y.o. female Room/Bed: WA12/WA12  Code Status   Code Status: Full Code  Home/SNF/Other Home Patient oriented to: self Is this baseline? Yes   Triage Complete: Triage complete  Chief Complaint Headache, SOB, weakness, Can't eat  Triage Note Patient here from home with complaints of headache and cough that started Saturday.    Allergies No Known Allergies  Level of Care/Admitting Diagnosis ED Disposition    ED Disposition Condition Comment   Admit  Hospital Area: [facility] [[id number]]  Level of Care: Telemetry [5]  Admit to tele based on following criteria: Complex arrhythmia (Bradycardia/Tachycardia)  Covid Evaluation: Confirmed COVID Negative  Diagnosis: CAP (community acquired pneumonia) [id number]  Admitting Physician: Shela Leff [id number]  Attending Physician: Shela Leff [id number]  PT Class (Do Not Modify): Observation [104]  PT Acc Code (Do Not Modify): Observation [10022]       B Medical/Surgery History Past Medical History:  Diagnosis Date  . Pneumonia    Past Surgical History:  Procedure Laterality Date  . BREAST BIOPSY Left 04/2016   REACTIVE LYMPHOID HYPERPLASIA   . BUNIONECTOMY     bilateral     A IV Location/Drains/Wounds Patient Lines/Drains/Airways Status   Active Line/Drains/Airways    Name:   Placement date:   Placement time:   Site:   Days:   Peripheral IV 07/24/19 Left Wrist   07/24/19    2035    Wrist   1   Peripheral IV 07/24/19 Right Forearm   07/24/19    2215    Forearm   1          Intake/Output Last 24 hours No intake or output data in the 24 hours ending 07/25/19 0319  Labs/Imaging Results for orders placed or performed during the hospital encounter of 07/24/19 (from the past 48 hour(s))  SARS Coronavirus 2 [facility] order, Performed in [facility] hospital lab) Nasopharyngeal  Nasopharyngeal Swab     Status: None   Collection Time: 07/24/19  7:52 PM   Specimen: Nasopharyngeal Swab  Result Value Ref Range   SARS Coronavirus 2 NEGATIVE NEGATIVE    Comment: (NOTE) If result is NEGATIVE SARS-CoV-2 target nucleic acids are NOT DETECTED. The SARS-CoV-2 RNA is generally detectable in upper and lower  respiratory specimens during the acute phase of infection. The lowest  concentration of SARS-CoV-2 viral copies this assay can detect is 250  copies / mL. A negative result does not preclude SARS-CoV-2 infection  and should not be used as the sole basis for treatment or other  patient management decisions.  A negative result may occur with  improper specimen collection / handling, submission of specimen other  than nasopharyngeal swab, presence of viral mutation(s) within the  areas targeted by this assay, and inadequate number of viral copies  (<250 copies / mL). A negative result must be combined with clinical  observations, patient history, and epidemiological information. If result is POSITIVE SARS-CoV-2 target nucleic acids are DETECTED. The SARS-CoV-2 RNA is generally detectable in upper and lower  respiratory specimens dur ing the acute phase of infection.  Positive  results are indicative of active infection with SARS-CoV-2.  Clinical  correlation with patient history and other diagnostic information is  necessary to determine patient infection status.  Positive results do  not rule out bacterial infection or co-infection with other viruses. If result is PRESUMPTIVE POSTIVE SARS-CoV-2 nucleic acids MAY BE  PRESENT.   A presumptive positive result was obtained on the submitted specimen  and confirmed on repeat testing.  While 2019 novel coronavirus  (SARS-CoV-2) nucleic acids may be present in the submitted sample  additional confirmatory testing may be necessary for epidemiological  and / or clinical management purposes  to differentiate between  SARS-CoV-2  and other Sarbecovirus currently known to infect humans.  If clinically indicated additional testing with an alternate test  methodology [phone]) is advised. The SARS-CoV-2 RNA is generally  detectable in upper and lower respiratory sp ecimens during the acute  phase of infection. The expected result is Negative. Fact Sheet for Patients:  StrictlyIdeas.no Fact Sheet for Healthcare Providers: BankingDealers.co.za This test is not yet approved or cleared by the [place] FDA and has been authorized for detection and/or diagnosis of SARS-CoV-2 by FDA under an Emergency Use Authorization (EUA).  This EUA will remain in effect (meaning this test can be used) for the duration of the COVID-19 declaration under Section 564(b)(1) of the Act, 21 U.S.C. section 360bbb-3(b)(1), unless the authorization is terminated or revoked sooner. Performed at [facility], [place] [address], [place], [place] [address]   Comprehensive metabolic panel     Status: Abnormal   Collection Time: 07/24/19  7:54 PM  Result Value Ref Range   Sodium 135 135 - 145 mmol/L   Potassium 3.6 3.5 - 5.1 mmol/L   Chloride 102 98 - 111 mmol/L   CO2 21 (L) 22 - 32 mmol/L   Glucose, Bld 123 (H) 70 - 99 mg/dL   BUN 14 6 - 20 mg/dL   Creatinine, Ser 0.81 0.44 - 1.00 mg/dL   Calcium 8.4 (L) 8.9 - 10.3 mg/dL   Total Protein 8.7 (H) 6.5 - 8.1 g/dL   Albumin 2.9 (L) 3.5 - 5.0 g/dL   AST 30 15 - 41 U/L   ALT 21 0 - 44 U/L   Alkaline Phosphatase 88 38 - 126 U/L   Total Bilirubin 1.0 0.3 - 1.2 mg/dL   GFR calc non Af Amer >60 >60 mL/min   GFR calc Af Amer >60 >60 mL/min   Anion gap 12 5 - 15    Comment: Performed at [facility], [place] [address] [place], [place] [address]  CBC WITH DIFFERENTIAL     Status: Abnormal   Collection Time: 07/24/19  7:54 PM  Result Value Ref Range   WBC 10.7 (H) 4.0 - 10.5 K/uL   RBC 4.24 3.87 - 5.11 MIL/uL    Hemoglobin 10.8 (L) 12.0 - 15.0 g/dL   HCT 34.9 (L) 36.0 - 46.0 %   MCV 82.3 80.0 - 100.0 fL   MCH 25.5 (L) 26.0 - 34.0 pg   MCHC 30.9 30.0 - 36.0 g/dL   RDW 17.6 (H) 11.5 - 15.5 %   Platelets 394 150 - 400 K/uL   nRBC 0.0 0.0 - 0.2 %   Neutrophils Relative % 87 %   Neutro Abs 9.3 (H) 1.7 - 7.7 K/uL   Lymphocytes Relative 8 %   Lymphs Abs 0.9 0.7 - 4.0 K/uL   Monocytes Relative 4 %   Monocytes Absolute 0.4 0.1 - 1.0 K/uL   Eosinophils Relative 0 %   Eosinophils Absolute 0.0 0.0 - 0.5 K/uL   Basophils Relative 0 %   Basophils Absolute 0.0 0.0 - 0.1 K/uL   Immature Granulocytes 1 %   Abs Immature Granulocytes 0.07 0.00 - 0.07 K/uL    Comment: Performed at [facility]  [place] [address] [place], [place] [address]  APTT     Status: Abnormal   Collection Time: 07/24/19  7:54 PM  Result Value Ref Range   aPTT 44 (H) 24 - 36 seconds    Comment:        IF BASELINE aPTT IS ELEVATED, SUGGEST PATIENT RISK ASSESSMENT BE USED TO DETERMINE APPROPRIATE ANTICOAGULANT THERAPY. Performed at [facility], [place] [address], [place], [place] [address]   Protime-INR     Status: None   Collection Time: 07/24/19  7:54 PM  Result Value Ref Range   Prothrombin Time 14.1 11.4 - 15.2 seconds   INR 1.1 0.8 - 1.2    Comment: (NOTE) INR goal varies based on device and disease states. Performed at [facility], [place] [address], [place], [place] [address]   Troponin I (High Sensitivity)     Status: None   Collection Time: 07/24/19  9:15 PM  Result Value Ref Range   Troponin I (High Sensitivity) 12 <18 ng/L    Comment: (NOTE) Elevated high sensitivity troponin I (hsTnI) values and significant  changes across serial measurements may suggest ACS but many other  chronic and acute conditions are known to elevate hsTnI results.  Refer to the "Links" section for chest pain algorithms and additional  guidance. Performed at [facility], [place] [address] [place], [place] [address]   I-Stat beta hCG blood, ED     Status: None   Collection Time: 07/24/19  9:18 PM  Result Value Ref Range   I-stat hCG, quantitative <5.0 <5 mIU/mL   Comment 3            Comment:   GEST. AGE      CONC.  (mIU/mL)   <=1 WEEK        5 - 50     2 WEEKS       50 - 500     3 WEEKS       100 - 10,000     4 WEEKS     1,000 - 30,000        FEMALE AND NON-PREGNANT FEMALE:     LESS THAN 5 mIU/mL   Lactic acid, plasma     Status: None   Collection Time: 07/24/19  9:54 PM  Result Value Ref Range   Lactic Acid, Venous 0.7 0.5 - 1.9 mmol/L    Comment: Performed at [facility], [place] [address], [place],  [address]   Dg Chest Port 1 View  Result Date: 07/24/2019 CLINICAL DATA:  Headache and cough, short of breath EXAM: PORTABLE CHEST 1 VIEW COMPARISON:  06/07/2013 FINDINGS: Left lower lobe airspace disease, suspicious for pneumonia. Right lung clear. Negative for heart failure or effusion. IMPRESSION: Left lower lobe airspace disease, suspicious for pneumonia. Electronically Signed   By: Franchot Gallo M.D.   On: 07/24/2019 21:19    Pending Labs Unresulted Labs (From admission, onward)    Start     Ordered   07/25/19 0500  CBC  Tomorrow morning,   R     07/25/19 0101   07/25/19 0059  HIV antibody (Routine Screening)  Once,   STAT     07/25/19 0101   07/24/19 1954  Blood Culture (routine x 2)  BLOOD CULTURE X 2,   STAT     07/24/19 1955   07/24/19 1954  Urinalysis, Routine w reflex microscopic  ONCE - STAT,   STAT  07/24/19 1955   07/24/19 1954  Urine culture  ONCE - STAT,   STAT     07/24/19 1955          Vitals/Pain Today's Vitals   07/25/19 0100 07/25/19 0130 07/25/19 0200 07/25/19 0230  BP: 137/83 (!) 141/71 (!) 151/75 (!) 147/77  Pulse: 95 98 94 93  Resp: (!) 28 (!) 24 (!) 25 12  Temp:      TempSrc:      SpO2: 92% 93% 94% 93%  Weight:      Height:      PainSc:        Isolation  Precautions No active isolations  Medications Medications  enoxaparin (LOVENOX) injection 40 mg (has no administration in time range)  cefTRIAXone (ROCEPHIN) 2 g in sodium chloride 0.9 % 100 mL IVPB (has no administration in time range)  azithromycin (ZITHROMAX) 500 mg in sodium chloride 0.9 % 250 mL IVPB (has no administration in time range)  0.9 %  sodium chloride infusion (has no administration in time range)  nicotine (NICODERM CQ - dosed in mg/24 hours) patch 14 mg (has no administration in time range)  acetaminophen (TYLENOL) tablet 650 mg (has no administration in time range)  acetaminophen (TYLENOL) tablet 650 mg (650 mg Oral Given 07/24/19 2118)  ceFEPIme (MAXIPIME) 2 g in sodium chloride 0.9 % 100 mL IVPB (0 g Intravenous Stopped 07/24/19 2251)  metroNIDAZOLE (FLAGYL) IVPB 500 mg (0 mg Intravenous Stopped 07/24/19 2252)  vancomycin (VANCOCIN) IVPB 1000 mg/200 mL premix (0 mg Intravenous Stopped 07/25/19 0107)  sodium chloride 0.9 % bolus 2,178 mL (0 mLs Intravenous Stopped 07/25/19 0137)    Mobility walks Low fall risk   Focused Assessments Pulmonary Assessment Handoff:  Lung sounds:   O2 Device: Room Air        R Recommendations: See Admitting Provider Note  Report given to:   Additional Notes : none

## 2019-07-25 NOTE — Progress Notes (Signed)
Rapid Response notified d/t red MEWS score.  Primary RN advised pt has been intermittently running fevers and that the MD is aware and that this is not an acute change.  Briefly reviewed chart, do not see anything I can add to her treatment plan at this time from rapid response protocols.  If temperature does not come down or any signs of deterioration please re-notify rapid response at [phone].  Jacqulyn Ducking ICU/SD RN4 / Care Coordinator / Rapid Response Nurse Rapid Response Number:  [phone] ICU Charge Nurse Number:  [phone]

## 2019-07-26 LAB — CBC WITH DIFFERENTIAL/PLATELET
Abs Immature Granulocytes: 0.12 10*3/uL — ABNORMAL HIGH (ref 0.00–0.07)
Basophils Absolute: 0 10*3/uL (ref 0.0–0.1)
Basophils Relative: 0 %
Eosinophils Absolute: 0.1 10*3/uL (ref 0.0–0.5)
Eosinophils Relative: 1 %
HCT: 30.2 % — ABNORMAL LOW (ref 36.0–46.0)
Hemoglobin: 9.2 g/dL — ABNORMAL LOW (ref 12.0–15.0)
Immature Granulocytes: 2 %
Lymphocytes Relative: 9 %
Lymphs Abs: 0.7 10*3/uL (ref 0.7–4.0)
MCH: 25.3 pg — ABNORMAL LOW (ref 26.0–34.0)
MCHC: 30.5 g/dL (ref 30.0–36.0)
MCV: 83.2 fL (ref 80.0–100.0)
Monocytes Absolute: 0.4 10*3/uL (ref 0.1–1.0)
Monocytes Relative: 5 %
Neutro Abs: 6.4 10*3/uL (ref 1.7–7.7)
Neutrophils Relative %: 83 %
Platelets: 348 10*3/uL (ref 150–400)
RBC: 3.63 MIL/uL — ABNORMAL LOW (ref 3.87–5.11)
RDW: 17.5 % — ABNORMAL HIGH (ref 11.5–15.5)
WBC: 7.7 10*3/uL (ref 4.0–10.5)
nRBC: 0 % (ref 0.0–0.2)

## 2019-07-26 LAB — BASIC METABOLIC PANEL
Anion gap: 9 (ref 5–15)
BUN: <5 mg/dL — ABNORMAL LOW (ref 6–20)
CO2: 21 mmol/L — ABNORMAL LOW (ref 22–32)
Calcium: 7.9 mg/dL — ABNORMAL LOW (ref 8.9–10.3)
Chloride: 106 mmol/L (ref 98–111)
Creatinine, Ser: 0.49 mg/dL (ref 0.44–1.00)
GFR calc Af Amer: >60 mL/min (ref >60)
GFR calc non Af Amer: >60 mL/min (ref >60)
Glucose, Bld: 107 mg/dL — ABNORMAL HIGH (ref 70–99)
Potassium: 3.3 mmol/L — ABNORMAL LOW (ref 3.5–5.1)
Sodium: 136 mmol/L (ref 135–145)

## 2019-07-26 LAB — URINE CULTURE: Culture: NO GROWTH

## 2019-07-26 MED ORDER — KETOROLAC TROMETHAMINE 15 MG/ML IJ SOLN: 15.0000 mg | Freq: Four times a day (QID) | INTRAMUSCULAR | Status: DC | PRN

## 2019-07-26 MED ORDER — POTASSIUM CHLORIDE CRYS ER 20 MEQ PO TBCR
40.0000 meq | EXTENDED_RELEASE_TABLET | Freq: Once | ORAL | Status: AC
  Administered 2019-07-26: 40 meq via ORAL
  Filled 2019-07-26: qty 2

## 2019-07-26 NOTE — Progress Notes (Signed)
Patients oxygen saturation assessed on room air at rest 87%. Pt reports feeling SOB. 2 liters reaplied, sat increased 95 %.  Unable to wean at this time.  Will continue to monitor.

## 2019-07-26 NOTE — Progress Notes (Signed)
PROGRESS NOTE    Tammy Cordova  [id number] DOB: 20-Apr-1966 DOA: 07/24/2019 PCP: Kelton Pillar, MD     Brief Narrative:  Tammy Cordova is a 53 y.o.femalewith medical history significant ofpneumoniain 2014presenting to the hospital with complaints of coughand shortness of breath.Patient states for the past 3 days she is having fatigue, cough productive of clear sputum, and shortness of breath. She is also having chills. She is having slight frontal headaches since her symptoms started 3 days ago. No change in vision or focal weakness/numbness. No other complaints.   New events last 24 hours / Subjective: Remains on nasal cannula O2 this morning.  Feeling slightly better since admission, continues to have frontal headaches and not feeling back to her baseline overall.  Fever last night around 10 PM, 101 Fahrenheit  Assessment & Plan:   Principal Problem:   CAP (community acquired pneumonia) Active Problems:   Sepsis ([place])   Encounter for screening for HIV   Tobacco use   Headache   Sepsis secondary toCAP -Sepsis present on admission. Tmax 102.7 F, tachycardic, and tachypneicon arrival -COVID-19 rapid test negative -Chest x-ray showing left lower lobe airspace disease suspicious for pneumonia -Blood cultures negative to date -Received vancomycin, cefepime, and metronidazole in the ED. Continue antibiotic coverage with ceftriaxone and azithromycin  Acute hypoxemic respiratory failure -Currently on 2 L nasal cannula O2.  Wean as able to room air  Tobacco use -Cessation counseling, nicotine patch  Mild frontal headaches -Toradol as needed  HIV screening -Nonreactive  Hypokalemia -Replace, trend       DVT prophylaxis: Lovenox  Code Status: Full code Family Communication: None Disposition Plan: Pending clinical improvement, wean oxygen to room air   Consultants:   None  Procedures:   None  Antimicrobials:  Anti-infectives (From  admission, onward)   Start     Dose/Rate Route Frequency Ordered Stop   07/25/19 1000  cefTRIAXone (ROCEPHIN) 2 g in sodium chloride 0.9 % 100 mL IVPB     2 g 200 mL/hr over 30 Minutes Intravenous Every 24 hours 07/25/19 0101 07/30/19 0959   07/25/19 0130  azithromycin (ZITHROMAX) 500 mg in sodium chloride 0.9 % 250 mL IVPB     500 mg 250 mL/hr over 60 Minutes Intravenous Daily at bedtime 07/25/19 0101 07/30/19 0459   07/24/19 2100  ceFEPIme (MAXIPIME) 2 g in sodium chloride 0.9 % 100 mL IVPB     2 g 200 mL/hr over 30 Minutes Intravenous  Once 07/24/19 2053 07/24/19 2251   07/24/19 2100  metroNIDAZOLE (FLAGYL) IVPB 500 mg     500 mg 100 mL/hr over 60 Minutes Intravenous  Once 07/24/19 2053 07/24/19 2252   07/24/19 2100  vancomycin (VANCOCIN) IVPB 1000 mg/200 mL premix     1,000 mg 200 mL/hr over 60 Minutes Intravenous  Once 07/24/19 2053 07/25/19 0107        Objective: Vitals:   07/26/19 0026 07/26/19 0122 07/26/19 0443 07/26/19 0500  BP: (!) 148/78 (!) 159/91 (!) 156/75   Pulse: 95 96 96   Resp: (!) 24 (!) 21 (!) 36 (!) 24  Temp: 98.5 F (36.9 C) 100.1 F (37.8 C) 99.9 F (37.7 C)   TempSrc: Oral Oral Oral   SpO2: 94% 95% 95%   Weight:      Height:        Intake/Output Summary (Last 24 hours) at 07/26/2019 1212 Last data filed at 07/26/2019 0514 Gross per 24 hour  Intake 46393.26 ml  Output --  Net 46393.26 ml  Filed Weights   07/24/19 1937  Weight: 72.6 kg    Examination:  General exam: Appears calm and comfortable  Respiratory system: Bibasilar crackles.  Respiratory effort normal. No respiratory distress. No conversational dyspnea.  On nasal cannula O2, 2 L Cardiovascular system: S1 & S2 heard, RRR. No murmurs. No pedal edema. Gastrointestinal system: Abdomen is nondistended, soft and nontender. Normal bowel sounds heard. Central nervous system: Alert and oriented. No focal neurological deficits. Speech clear.  Extremities: Symmetric in appearance  Skin: No  rashes, lesions or ulcers on exposed skin  Psychiatry: Judgement and insight appear normal. Mood & affect appropriate.   Data Reviewed: I have personally reviewed following labs and imaging studies  CBC: Recent Labs  Lab 07/24/19 1954 07/25/19 0523 07/26/19 0525  WBC 10.7* 7.9 7.7  NEUTROABS 9.3*  --  6.4  HGB 10.8* 9.6* 9.2*  HCT 34.9* 31.8* 30.2*  MCV 82.3 83.0 83.2  PLT 394 347 0000000   Basic Metabolic Panel: Recent Labs  Lab 07/24/19 1954 07/26/19 0525  NA 135 136  K 3.6 3.3*  CL 102 106  CO2 21* 21*  GLUCOSE 123* 107*  BUN 14 <5*  CREATININE 0.81 0.49  CALCIUM 8.4* 7.9*   GFR: Estimated Creatinine Clearance: 80 mL/min (by C-G formula based on SCr of 0.49 mg/dL). Liver Function Tests: Recent Labs  Lab 07/24/19 1954  AST 30  ALT 21  ALKPHOS 88  BILITOT 1.0  PROT 8.7*  ALBUMIN 2.9*   No results for input(s): LIPASE, AMYLASE in the last 168 hours. No results for input(s): AMMONIA in the last 168 hours. Coagulation Profile: Recent Labs  Lab 07/24/19 1954  INR 1.1   Cardiac Enzymes: No results for input(s): CKTOTAL, CKMB, CKMBINDEX, TROPONINI in the last 168 hours. BNP (last 3 results) No results for input(s): PROBNP in the last 8760 hours. HbA1C: No results for input(s): HGBA1C in the last 72 hours. CBG: No results for input(s): GLUCAP in the last 168 hours. Lipid Profile: No results for input(s): CHOL, HDL, LDLCALC, TRIG, CHOLHDL, LDLDIRECT in the last 72 hours. Thyroid Function Tests: No results for input(s): TSH, T4TOTAL, FREET4, T3FREE, THYROIDAB in the last 72 hours. Anemia Panel: No results for input(s): VITAMINB12, FOLATE, FERRITIN, TIBC, IRON, RETICCTPCT in the last 72 hours. Sepsis Labs: Recent Labs  Lab 07/24/19 2154  LATICACIDVEN 0.7    Recent Results (from the past 240 hour(s))  SARS Coronavirus 2 [facility] order, Performed in [facility] hospital lab) Nasopharyngeal Nasopharyngeal Swab     Status: None   Collection Time: 07/24/19   7:52 PM   Specimen: Nasopharyngeal Swab  Result Value Ref Range Status   SARS Coronavirus 2 NEGATIVE NEGATIVE Final    Comment: (NOTE) If result is NEGATIVE SARS-CoV-2 target nucleic acids are NOT DETECTED. The SARS-CoV-2 RNA is generally detectable in upper and lower  respiratory specimens during the acute phase of infection. The lowest  concentration of SARS-CoV-2 viral copies this assay can detect is 250  copies / mL. A negative result does not preclude SARS-CoV-2 infection  and should not be used as the sole basis for treatment or other  patient management decisions.  A negative result may occur with  improper specimen collection / handling, submission of specimen other  than nasopharyngeal swab, presence of viral mutation(s) within the  areas targeted by this assay, and inadequate number of viral copies  (<250 copies / mL). A negative result must be combined with clinical  observations, patient history, and epidemiological information. If result  is POSITIVE SARS-CoV-2 target nucleic acids are DETECTED. The SARS-CoV-2 RNA is generally detectable in upper and lower  respiratory specimens dur ing the acute phase of infection.  Positive  results are indicative of active infection with SARS-CoV-2.  Clinical  correlation with patient history and other diagnostic information is  necessary to determine patient infection status.  Positive results do  not rule out bacterial infection or co-infection with other viruses. If result is PRESUMPTIVE POSTIVE SARS-CoV-2 nucleic acids MAY BE PRESENT.   A presumptive positive result was obtained on the submitted specimen  and confirmed on repeat testing.  While 2019 novel coronavirus  (SARS-CoV-2) nucleic acids may be present in the submitted sample  additional confirmatory testing may be necessary for epidemiological  and / or clinical management purposes  to differentiate between  SARS-CoV-2 and other Sarbecovirus currently known to infect  humans.  If clinically indicated additional testing with an alternate test  methodology [phone]) is advised. The SARS-CoV-2 RNA is generally  detectable in upper and lower respiratory sp ecimens during the acute  phase of infection. The expected result is Negative. Fact Sheet for Patients:  StrictlyIdeas.no Fact Sheet for Healthcare Providers: BankingDealers.co.za This test is not yet approved or cleared by the [place] FDA and has been authorized for detection and/or diagnosis of SARS-CoV-2 by FDA under an Emergency Use Authorization (EUA).  This EUA will remain in effect (meaning this test can be used) for the duration of the COVID-19 declaration under Section 564(b)(1) of the Act, 21 U.S.C. section 360bbb-3(b)(1), unless the authorization is terminated or revoked sooner. Performed at [facility], [place] [address], [place], [place] [address]   Blood Culture (routine x 2)     Status: None (Preliminary result)   Collection Time: 07/24/19  7:54 PM   Specimen: BLOOD  Result Value Ref Range Status   Specimen Description   Final    BLOOD LEFT ANTECUBITAL Performed at [place] [address], [place], [place] [address]    Special Requests   Final    Blood Culture adequate volume BOTTLES DRAWN AEROBIC AND ANAEROBIC Performed at [place] [address], [place], [place] [address]    Culture   Final    NO GROWTH 2 DAYS Performed at [place] [address], [place], [place] [address]    Report Status PENDING  Incomplete  Urine culture     Status: None   Collection Time: 07/24/19  7:54 PM   Specimen: In/Out Cath Urine  Result Value Ref Range Status   Specimen Description   Final    IN/OUT CATH URINE Performed at [place] [address], [place], [place] [address]    Special Requests   Final    NONE Performed at [facility], [place] [address] [place], [place] [address]    Culture   Final    NO GROWTH Performed at [facility], [place] [address], [place], [place] [address]    Report Status 07/26/2019 FINAL  Final  Blood Culture (routine x 2)     Status: None (Preliminary result)   Collection Time: 07/24/19  7:59 PM   Specimen: BLOOD  Result Value Ref Range Status   Specimen Description   Final    BLOOD BLOOD LEFT WRIST Performed at [place] [address], [place], [place] [address]    Special Requests   Final    BOTTLES DRAWN AEROBIC AND  ANAEROBIC Blood Culture results may not be optimal due to an inadequate volume of blood received in culture bottles Performed at [facility], [place] [address] [place], [place] [address]    Culture   Final    NO GROWTH 2 DAYS Performed at [place] [address], [place], [place] [address]    Report Status PENDING  Incomplete      Radiology Studies: Dg Chest Port 1 View  Result Date: 07/24/2019 CLINICAL DATA:  Headache and cough, short of breath EXAM: PORTABLE CHEST 1 VIEW COMPARISON:  06/07/2013 FINDINGS: Left lower lobe airspace disease, suspicious for pneumonia. Right lung clear. Negative for heart failure or effusion. IMPRESSION: Left lower lobe airspace disease, suspicious for pneumonia. Electronically Signed   By: Franchot Gallo M.D.   On: 07/24/2019 21:19      Scheduled Meds:  enoxaparin (LOVENOX) injection  40 mg Subcutaneous Q24H   nicotine  14 mg Transdermal Daily   potassium chloride  40 mEq Oral Once   Continuous Infusions:  azithromycin 500 mg (07/26/19 0514)   cefTRIAXone (ROCEPHIN)  IV 2 g (07/26/19 1100)     LOS: 1 day      Time spent: 35 minutes   Dessa Phi, DO [facility] [email or web address] 07/26/2019, 12:12 PM

## 2019-07-27 ENCOUNTER — Inpatient Hospital Stay (HOSPITAL_COMMUNITY): Payer: 59

## 2019-07-27 LAB — MAGNESIUM: Magnesium: 1.8 mg/dL (ref 1.7–2.4)

## 2019-07-27 LAB — CBC
HCT: 29.8 % — ABNORMAL LOW (ref 36.0–46.0)
Hemoglobin: 9.1 g/dL — ABNORMAL LOW (ref 12.0–15.0)
MCH: 25.3 pg — ABNORMAL LOW (ref 26.0–34.0)
MCHC: 30.5 g/dL (ref 30.0–36.0)
MCV: 82.8 fL (ref 80.0–100.0)
Platelets: 372 10*3/uL (ref 150–400)
RBC: 3.6 MIL/uL — ABNORMAL LOW (ref 3.87–5.11)
RDW: 17.6 % — ABNORMAL HIGH (ref 11.5–15.5)
WBC: 9.4 10*3/uL (ref 4.0–10.5)
nRBC: 0 % (ref 0.0–0.2)

## 2019-07-27 LAB — BASIC METABOLIC PANEL
Anion gap: 13 (ref 5–15)
BUN: 5 mg/dL — ABNORMAL LOW (ref 6–20)
CO2: 23 mmol/L (ref 22–32)
Calcium: 8 mg/dL — ABNORMAL LOW (ref 8.9–10.3)
Chloride: 98 mmol/L (ref 98–111)
Creatinine, Ser: 0.46 mg/dL (ref 0.44–1.00)
GFR calc Af Amer: >60 mL/min (ref >60)
GFR calc non Af Amer: >60 mL/min (ref >60)
Glucose, Bld: 107 mg/dL — ABNORMAL HIGH (ref 70–99)
Potassium: 3.3 mmol/L — ABNORMAL LOW (ref 3.5–5.1)
Sodium: 134 mmol/L — ABNORMAL LOW (ref 135–145)

## 2019-07-27 MED ORDER — POTASSIUM CHLORIDE CRYS ER 20 MEQ PO TBCR
40.0000 meq | EXTENDED_RELEASE_TABLET | Freq: Once | ORAL | Status: AC
  Administered 2019-07-27: 40 meq via ORAL
  Filled 2019-07-27: qty 2

## 2019-07-27 MED ORDER — IOHEXOL 350 MG/ML SOLN
100.0000 mL | Freq: Once | INTRAVENOUS | Status: AC | PRN
  Administered 2019-07-27: 80 mL via INTRAVENOUS

## 2019-07-27 MED ORDER — SODIUM CHLORIDE (PF) 0.9 % IJ SOLN
INTRAMUSCULAR | Status: AC
  Filled 2019-07-27: qty 50

## 2019-07-27 MED ORDER — SODIUM CHLORIDE 0.9 % IV BOLUS
500.0000 mL | Freq: Once | INTRAVENOUS | Status: AC
  Administered 2019-07-27: 500 mL via INTRAVENOUS

## 2019-07-27 NOTE — Progress Notes (Signed)
Pt oxygen saturation remained between 88-92% all night on 2 L O2. Unable to wean oxygen. Will continue to monitor.

## 2019-07-27 NOTE — Plan of Care (Signed)
Continue current POC 

## 2019-07-27 NOTE — Progress Notes (Signed)
Lab Results WBC  Date/Time Value Ref Range Status  07/27/2019 04:57 AM 9.4 4.0 - 10.5 K/uL Final  07/26/2019 05:25 AM 7.7 4.0 - 10.5 K/uL Final    Comment:    WHITE COUNT CONFIRMED ON SMEAR  07/25/2019 05:23 AM 7.9 4.0 - 10.5 K/uL Final   Neutrophils Relative %  Date/Time Value Ref Range Status  07/26/2019 05:25 AM 83 % Final  07/24/2019 07:54 PM 87 % Final  06/07/2013 10:05 AM 71 43 - 77 % Final   No results found for: PCO2ART Lactic Acid, Venous  Date/Time Value Ref Range Status  07/24/2019 09:54 PM 0.7 0.5 - 1.9 mmol/L Final    Comment:    Performed at [facility], [place] [address] [place], [place] [address]   No results found for: PCO2VEN  Resp 20 Pulse 112 BP 158/93 Temp 100.4  Tylenol 1000mg  PO given and IS encouraged

## 2019-07-27 NOTE — Progress Notes (Signed)
PROGRESS NOTE    Tammy Cordova  [id number] DOB: 06-Mar-1966 DOA: 07/24/2019 PCP: Kelton Pillar, MD     Brief Narrative:  Tammy Cordova is a 53 y.o.femalewith medical history significant ofpneumoniain 2014presenting to the hospital with complaints of coughand shortness of breath.Patient states for the past 3 days she is having fatigue, cough productive of clear sputum, and shortness of breath. She is also having chills. She is having slight frontal headaches since her symptoms started 3 days ago. No change in vision or focal weakness/numbness. No other complaints.   New events last 24 hours / Subjective: Oxygen unable to be weaned down.  Had fevers last night, Tmax 100.9.  Feeling worse today, continues to be short of breath, some right-sided intermittent chest pains as well.  Assessment & Plan:   Principal Problem:   CAP (community acquired pneumonia) Active Problems:   Sepsis ([place])   Encounter for screening for HIV   Tobacco use   Headache   Sepsis secondary toCAP -Sepsis present on admission. Tmax 102.7 F, tachycardic, and tachypneicon arrival -COVID-19 rapid test negative -Chest x-ray showing left lower lobe airspace disease suspicious for pneumonia -Blood cultures negative to date -Received vancomycin, cefepime, and metronidazole in the ED. Continue antibiotic coverage with ceftriaxone and azithromycin  Acute hypoxemic respiratory failure -Currently on 3 L nasal cannula O2.  Wean as able to room air -We will obtain CTA chest to rule out PE as well as to better evaluate pulmonary infiltrates due to patient's continued hypoxia, tachycardia as well as chest pain  Tobacco use -Cessation counseling, nicotine patch -Admits to smoking about half pack per day for many years  Mild frontal headaches -Toradol as needed  HIV screening -Nonreactive  Hypokalemia -Replace, trend. Mag normal.          DVT prophylaxis: Lovenox  Code Status: Full  code Family Communication: None Disposition Plan: Pending clinical improvement, wean oxygen to room air.  CTA chest ordered today   Consultants:   None  Procedures:   None  Antimicrobials:  Anti-infectives (From admission, onward)   Start     Dose/Rate Route Frequency Ordered Stop   07/25/19 1000  cefTRIAXone (ROCEPHIN) 2 g in sodium chloride 0.9 % 100 mL IVPB     2 g 200 mL/hr over 30 Minutes Intravenous Every 24 hours 07/25/19 0101 07/30/19 0959   07/25/19 0130  azithromycin (ZITHROMAX) 500 mg in sodium chloride 0.9 % 250 mL IVPB     500 mg 250 mL/hr over 60 Minutes Intravenous Daily at bedtime 07/25/19 0101 07/30/19 0459   07/24/19 2100  ceFEPIme (MAXIPIME) 2 g in sodium chloride 0.9 % 100 mL IVPB     2 g 200 mL/hr over 30 Minutes Intravenous  Once 07/24/19 2053 07/24/19 2251   07/24/19 2100  metroNIDAZOLE (FLAGYL) IVPB 500 mg     500 mg 100 mL/hr over 60 Minutes Intravenous  Once 07/24/19 2053 07/24/19 2252   07/24/19 2100  vancomycin (VANCOCIN) IVPB 1000 mg/200 mL premix     1,000 mg 200 mL/hr over 60 Minutes Intravenous  Once 07/24/19 2053 07/25/19 0107       Objective: Vitals:   07/26/19 2327 07/27/19 0341 07/27/19 0657 07/27/19 1125  BP: (!) 159/88 (!) 156/83 (!) 154/93 (!) 165/95  Pulse: (!) 106 98 (!) 105 (!) 102  Resp: 20 18 20    Temp: (!) 100.5 F (38.1 C) 98.6 F (37 C) 98.8 F (37.1 C)   TempSrc: Oral Oral Oral   SpO2: 95% 94% 94% 97%  Weight:      Height:        Intake/Output Summary (Last 24 hours) at 07/27/2019 1127 Last data filed at 07/27/2019 0900 Gross per 24 hour  Intake 1876.81 ml  Output --  Net 1876.81 ml   Filed Weights   07/24/19 1937  Weight: 72.6 kg    Examination: General exam: Appears calm and comfortable  Respiratory system: Bibasilar crackles.  Tachypneic.  On 3 L nasal cannula O2. Respiratory effort slightly worse compared to yesterday. Cardiovascular system: S1 & S2 heard, regular rhythm, tachycardic rate low 100s. No  JVD, murmurs, rubs, gallops or clicks. No pedal edema. Gastrointestinal system: Abdomen is nondistended, soft and nontender. No organomegaly or masses felt. Normal bowel sounds heard. Central nervous system: Alert and oriented. No focal neurological deficits. Extremities: Symmetric 5 x 5 power. Skin: No rashes, lesions or ulcers Psychiatry: Judgement and insight appear normal. Mood & affect appropriate.   Data Reviewed: I have personally reviewed following labs and imaging studies  CBC: Recent Labs  Lab 07/24/19 1954 07/25/19 0523 07/26/19 0525 07/27/19 0457  WBC 10.7* 7.9 7.7 9.4  NEUTROABS 9.3*  --  6.4  --   HGB 10.8* 9.6* 9.2* 9.1*  HCT 34.9* 31.8* 30.2* 29.8*  MCV 82.3 83.0 83.2 82.8  PLT 394 347 348 [id number]   Basic Metabolic Panel: Recent Labs  Lab 07/24/19 1954 07/26/19 0525 07/27/19 0457  NA 135 136 134*  K 3.6 3.3* 3.3*  CL 102 106 98  CO2 21* 21* 23  GLUCOSE 123* 107* 107*  BUN 14 <5* 5*  CREATININE 0.81 0.49 0.46  CALCIUM 8.4* 7.9* 8.0*  MG  --   --  1.8   GFR: Estimated Creatinine Clearance: 80 mL/min (by C-G formula based on SCr of 0.46 mg/dL). Liver Function Tests: Recent Labs  Lab 07/24/19 1954  AST 30  ALT 21  ALKPHOS 88  BILITOT 1.0  PROT 8.7*  ALBUMIN 2.9*   No results for input(s): LIPASE, AMYLASE in the last 168 hours. No results for input(s): AMMONIA in the last 168 hours. Coagulation Profile: Recent Labs  Lab 07/24/19 1954  INR 1.1   Cardiac Enzymes: No results for input(s): CKTOTAL, CKMB, CKMBINDEX, TROPONINI in the last 168 hours. BNP (last 3 results) No results for input(s): PROBNP in the last 8760 hours. HbA1C: No results for input(s): HGBA1C in the last 72 hours. CBG: No results for input(s): GLUCAP in the last 168 hours. Lipid Profile: No results for input(s): CHOL, HDL, LDLCALC, TRIG, CHOLHDL, LDLDIRECT in the last 72 hours. Thyroid Function Tests: No results for input(s): TSH, T4TOTAL, FREET4, T3FREE, THYROIDAB in the  last 72 hours. Anemia Panel: No results for input(s): VITAMINB12, FOLATE, FERRITIN, TIBC, IRON, RETICCTPCT in the last 72 hours. Sepsis Labs: Recent Labs  Lab 07/24/19 2154  LATICACIDVEN 0.7    Recent Results (from the past 240 hour(s))  SARS Coronavirus 2 [facility] order, Performed in [facility] hospital lab) Nasopharyngeal Nasopharyngeal Swab     Status: None   Collection Time: 07/24/19  7:52 PM   Specimen: Nasopharyngeal Swab  Result Value Ref Range Status   SARS Coronavirus 2 NEGATIVE NEGATIVE Final    Comment: (NOTE) If result is NEGATIVE SARS-CoV-2 target nucleic acids are NOT DETECTED. The SARS-CoV-2 RNA is generally detectable in upper and lower  respiratory specimens during the acute phase of infection. The lowest  concentration of SARS-CoV-2 viral copies this assay can detect is 250  copies / mL. A negative result does not preclude  SARS-CoV-2 infection  and should not be used as the sole basis for treatment or other  patient management decisions.  A negative result may occur with  improper specimen collection / handling, submission of specimen other  than nasopharyngeal swab, presence of viral mutation(s) within the  areas targeted by this assay, and inadequate number of viral copies  (<250 copies / mL). A negative result must be combined with clinical  observations, patient history, and epidemiological information. If result is POSITIVE SARS-CoV-2 target nucleic acids are DETECTED. The SARS-CoV-2 RNA is generally detectable in upper and lower  respiratory specimens dur ing the acute phase of infection.  Positive  results are indicative of active infection with SARS-CoV-2.  Clinical  correlation with patient history and other diagnostic information is  necessary to determine patient infection status.  Positive results do  not rule out bacterial infection or co-infection with other viruses. If result is PRESUMPTIVE POSTIVE SARS-CoV-2 nucleic acids MAY BE PRESENT.    A presumptive positive result was obtained on the submitted specimen  and confirmed on repeat testing.  While 2019 novel coronavirus  (SARS-CoV-2) nucleic acids may be present in the submitted sample  additional confirmatory testing may be necessary for epidemiological  and / or clinical management purposes  to differentiate between  SARS-CoV-2 and other Sarbecovirus currently known to infect humans.  If clinically indicated additional testing with an alternate test  methodology [id number]) is advised. The SARS-CoV-2 RNA is generally  detectable in upper and lower respiratory sp ecimens during the acute  phase of infection. The expected result is Negative. Fact Sheet for Patients:  StrictlyIdeas.no Fact Sheet for Healthcare Providers: BankingDealers.co.za This test is not yet approved or cleared by the [place] FDA and has been authorized for detection and/or diagnosis of SARS-CoV-2 by FDA under an Emergency Use Authorization (EUA).  This EUA will remain in effect (meaning this test can be used) for the duration of the COVID-19 declaration under Section 564(b)(1) of the Act, 21 U.S.C. section 360bbb-3(b)(1), unless the authorization is terminated or revoked sooner. Performed at [facility], [employer] [address], [place], [place] [address]   Blood Culture (routine x 2)     Status: None (Preliminary result)   Collection Time: 07/24/19  7:54 PM   Specimen: BLOOD  Result Value Ref Range Status   Specimen Description   Final    BLOOD LEFT ANTECUBITAL Performed at [place] [address] [place], [place] [address]    Special Requests   Final    Blood Culture adequate volume BOTTLES DRAWN AEROBIC AND ANAEROBIC Performed at [place] [address], [place], [place] [address]    Culture   Final    NO GROWTH 3 DAYS Performed at [facility], [place] [address],  [place], [place] [address]    Report Status PENDING  Incomplete  Urine culture     Status: None   Collection Time: 07/24/19  7:54 PM   Specimen: In/Out Cath Urine  Result Value Ref Range Status   Specimen Description   Final    IN/OUT CATH URINE Performed at [place] [address] [place], [place] [address]    Special Requests   Final    NONE Performed at [facility], [place] [address] [place], [place] [address]    Culture   Final    NO GROWTH Performed at [facility], [place] [address] [place],  [address]  Report Status 07/26/2019 FINAL  Final  Blood Culture (routine x 2)     Status: None (Preliminary result)   Collection Time: 07/24/19  7:59 PM   Specimen: BLOOD  Result Value Ref Range Status   Specimen Description   Final    BLOOD BLOOD LEFT WRIST Performed at [facility] [address], [employer], [place] [address]    Special Requests   Final    BOTTLES DRAWN AEROBIC AND ANAEROBIC Blood Culture results may not be optimal due to an inadequate volume of blood received in culture bottles Performed at [facility] [address], [place], [place] [address]    Culture   Final    NO GROWTH 3 DAYS Performed at [facility], [place] [address], [place], [place] [address]    Report Status PENDING  Incomplete      Radiology Studies: No results found.    Scheduled Meds:  enoxaparin (LOVENOX) injection  40 mg Subcutaneous Q24H   nicotine  14 mg Transdermal Daily   Continuous Infusions:  azithromycin 500 mg (07/27/19 0539)   cefTRIAXone (ROCEPHIN)  IV 2 g (07/27/19 0952)     LOS: 2 days      Time spent: 35 minutes   Dessa Phi, DO [facility] [email or web address] 07/27/2019, 11:27 AM

## 2019-07-28 DIAGNOSIS — J9601 Acute respiratory failure with hypoxia: Secondary | ICD-10-CM

## 2019-07-28 DIAGNOSIS — J181 Lobar pneumonia, unspecified organism: Secondary | ICD-10-CM

## 2019-07-28 DIAGNOSIS — J432 Centrilobular emphysema: Secondary | ICD-10-CM

## 2019-07-28 DIAGNOSIS — J449 Chronic obstructive pulmonary disease, unspecified: Secondary | ICD-10-CM | POA: Diagnosis present

## 2019-07-28 LAB — BASIC METABOLIC PANEL
Anion gap: 9 (ref 5–15)
BUN: 5 mg/dL — ABNORMAL LOW (ref 6–20)
CO2: 25 mmol/L (ref 22–32)
Calcium: 8.1 mg/dL — ABNORMAL LOW (ref 8.9–10.3)
Chloride: 103 mmol/L (ref 98–111)
Creatinine, Ser: 0.41 mg/dL — ABNORMAL LOW (ref 0.44–1.00)
GFR calc Af Amer: >60 mL/min (ref >60)
GFR calc non Af Amer: >60 mL/min (ref >60)
Glucose, Bld: 107 mg/dL — ABNORMAL HIGH (ref 70–99)
Potassium: 3.5 mmol/L (ref 3.5–5.1)
Sodium: 137 mmol/L (ref 135–145)

## 2019-07-28 LAB — CBC
HCT: 29.1 % — ABNORMAL LOW (ref 36.0–46.0)
Hemoglobin: 9.1 g/dL — ABNORMAL LOW (ref 12.0–15.0)
MCH: 25.6 pg — ABNORMAL LOW (ref 26.0–34.0)
MCHC: 31.3 g/dL (ref 30.0–36.0)
MCV: 81.7 fL (ref 80.0–100.0)
Platelets: 367 10*3/uL (ref 150–400)
RBC: 3.56 MIL/uL — ABNORMAL LOW (ref 3.87–5.11)
RDW: 17.5 % — ABNORMAL HIGH (ref 11.5–15.5)
WBC: 8.8 10*3/uL (ref 4.0–10.5)
nRBC: 0 % (ref 0.0–0.2)

## 2019-07-28 MED ORDER — PANTOPRAZOLE SODIUM 40 MG PO TBEC
40.0000 mg | DELAYED_RELEASE_TABLET | Freq: Two times a day (BID) | ORAL | Status: DC
  Administered 2019-07-28 – 2019-07-30 (×4): 40 mg via ORAL
  Filled 2019-07-28 (×4): qty 1

## 2019-07-28 MED ORDER — SODIUM CHLORIDE 0.9 % IV BOLUS
500.0000 mL | Freq: Once | INTRAVENOUS | Status: AC
  Administered 2019-07-28: 22:00:00 500 mL via INTRAVENOUS

## 2019-07-28 MED ORDER — DM-GUAIFENESIN ER 30-600 MG PO TB12: 1.0000 | ORAL_TABLET | Freq: Two times a day (BID) | ORAL | Status: DC | PRN

## 2019-07-28 NOTE — Progress Notes (Signed)
PROGRESS NOTE    Tammy Cordova  [id number] DOB: [birth year] DOA: 07/24/2019 PCP: Kelton Pillar, MD     Brief Narrative:  Tammy Cordova is a 53 y.o.femalewith medical history significant ofpneumoniain 2014presenting to the hospital with complaints of coughand shortness of breath.Patient states for the past 3 days she is having fatigue, cough productive of clear sputum, and shortness of breath. She is also having chills. She is having slight frontal headaches since her symptoms started 3 days ago. No change in vision or focal weakness/numbness. No other complaints.   New events last 24 hours / Subjective: States that she feels slightly better since admission.  Had another fever overnight 100.4.  Continues to be on 3 L nasal cannula O2 with exertional dyspnea.  Complains of substernal chest pain as well.  Assessment & Plan:   Principal Problem:   CAP (community acquired pneumonia) Active Problems:   Sepsis ([place])   Encounter for screening for HIV   Tobacco use   Headache   Sepsis secondary toLUL CAP with parapneumonic effusion -Sepsis present on admission. Tmax 102.7 F, tachycardic, and tachypneicon arrival -COVID-19 rapid test negative -Chest x-ray showing left lower lobe airspace disease suspicious for pneumonia -Blood cultures negative to date -Received vancomycin, cefepime, and metronidazole in the ED. Continue antibiotic coverage with ceftriaxone and azithromycin  -CTA chest revealed left upper lobe pneumonia, emphysema, parapneumonic effusion.  Consulted PCCM  Acute hypoxemic respiratory failure secondary to above -Currently on 3 L nasal cannula O2.  Wean as able to room air  Tobacco use -Cessation counseling, nicotine patch -Admits to smoking about half pack per day for many years  HIV screening -Nonreactive         DVT prophylaxis: Lovenox  Code Status: Full code Family Communication: None Disposition Plan: Pending clinical improvement,  wean oxygen to room air.  PCCM consulted today   Consultants:   PCCM  Procedures:   None  Antimicrobials:  Anti-infectives (From admission, onward)   Start     Dose/Rate Route Frequency Ordered Stop   07/25/19 1000  cefTRIAXone (ROCEPHIN) 2 g in sodium chloride 0.9 % 100 mL IVPB     2 g 200 mL/hr over 30 Minutes Intravenous Every 24 hours 07/25/19 0101 07/30/19 0959   07/25/19 0130  azithromycin (ZITHROMAX) 500 mg in sodium chloride 0.9 % 250 mL IVPB     500 mg 250 mL/hr over 60 Minutes Intravenous Daily at bedtime 07/25/19 0101 07/30/19 0459   07/24/19 2100  ceFEPIme (MAXIPIME) 2 g in sodium chloride 0.9 % 100 mL IVPB     2 g 200 mL/hr over 30 Minutes Intravenous  Once 07/24/19 2053 07/24/19 2251   07/24/19 2100  metroNIDAZOLE (FLAGYL) IVPB 500 mg     500 mg 100 mL/hr over 60 Minutes Intravenous  Once 07/24/19 2053 07/24/19 2252   07/24/19 2100  vancomycin (VANCOCIN) IVPB 1000 mg/200 mL premix     1,000 mg 200 mL/hr over 60 Minutes Intravenous  Once 07/24/19 2053 07/25/19 0107       Objective: Vitals:   07/27/19 2107 07/27/19 2201 07/27/19 2318 07/28/19 0440  BP: (!) 158/93 139/77 139/76 136/76  Pulse: (!) 112 (!) 108 99 86  Resp: 20 15 15 15   Temp: (!) 100.4 F (38 C) 99.8 F (37.7 C) 99.6 F (37.6 C) 98.5 F (36.9 C)  TempSrc: Oral Oral Oral Oral  SpO2: 98% 93% 95% 96%  Weight:      Height:        Intake/Output Summary (Last  24 hours) at 07/28/2019 1142 Last data filed at 07/28/2019 0940 Gross per 24 hour  Intake 1470 ml  Output -  Net 1470 ml   Filed Weights   07/24/19 1937  Weight: 72.6 kg    Examination: General exam: Appears calm and comfortable  Respiratory system: Diminished breath sounds, on 3 L nasal cannula O2.  Less tachypneic than examination yesterday Cardiovascular system: S1 & S2 heard, RRR. No JVD, murmurs, rubs, gallops or clicks. No pedal edema. Gastrointestinal system: Abdomen is nondistended, soft and nontender. No organomegaly or  masses felt. Normal bowel sounds heard. Central nervous system: Alert and oriented. No focal neurological deficits. Extremities: Symmetric 5 x 5 power. Skin: No rashes, lesions or ulcers Psychiatry: Judgement and insight appear normal. Mood & affect appropriate.    Data Reviewed: I have personally reviewed following labs and imaging studies  CBC: Recent Labs  Lab 07/24/19 1954 07/25/19 0523 07/26/19 0525 07/27/19 0457 07/28/19 0451  WBC 10.7* 7.9 7.7 9.4 8.8  NEUTROABS 9.3*  --  6.4  --   --   HGB 10.8* 9.6* 9.2* 9.1* 9.1*  HCT 34.9* 31.8* 30.2* 29.8* 29.1*  MCV 82.3 83.0 83.2 82.8 81.7  PLT 394 347 348 372 [id number]   Basic Metabolic Panel: Recent Labs  Lab 07/24/19 1954 07/26/19 0525 07/27/19 0457 07/28/19 0451  NA 135 136 134* 137  K 3.6 3.3* 3.3* 3.5  CL 102 106 98 103  CO2 21* 21* 23 25  GLUCOSE 123* 107* 107* 107*  BUN 14 <5* 5* 5*  CREATININE 0.81 0.49 0.46 0.41*  CALCIUM 8.4* 7.9* 8.0* 8.1*  MG  --   --  1.8  --    GFR: Estimated Creatinine Clearance: 80 mL/min (A) (by C-G formula based on SCr of 0.41 mg/dL (L)). Liver Function Tests: Recent Labs  Lab 07/24/19 1954  AST 30  ALT 21  ALKPHOS 88  BILITOT 1.0  PROT 8.7*  ALBUMIN 2.9*   No results for input(s): LIPASE, AMYLASE in the last 168 hours. No results for input(s): AMMONIA in the last 168 hours. Coagulation Profile: Recent Labs  Lab 07/24/19 1954  INR 1.1   Cardiac Enzymes: No results for input(s): CKTOTAL, CKMB, CKMBINDEX, TROPONINI in the last 168 hours. BNP (last 3 results) No results for input(s): PROBNP in the last 8760 hours. HbA1C: No results for input(s): HGBA1C in the last 72 hours. CBG: No results for input(s): GLUCAP in the last 168 hours. Lipid Profile: No results for input(s): CHOL, HDL, LDLCALC, TRIG, CHOLHDL, LDLDIRECT in the last 72 hours. Thyroid Function Tests: No results for input(s): TSH, T4TOTAL, FREET4, T3FREE, THYROIDAB in the last 72 hours. Anemia Panel: No  results for input(s): VITAMINB12, FOLATE, FERRITIN, TIBC, IRON, RETICCTPCT in the last 72 hours. Sepsis Labs: Recent Labs  Lab 07/24/19 2154  LATICACIDVEN 0.7    Recent Results (from the past 240 hour(s))  SARS Coronavirus 2 [facility] order, Performed in [facility] hospital lab) Nasopharyngeal Nasopharyngeal Swab     Status: None   Collection Time: 07/24/19  7:52 PM   Specimen: Nasopharyngeal Swab  Result Value Ref Range Status   SARS Coronavirus 2 NEGATIVE NEGATIVE Final    Comment: (NOTE) If result is NEGATIVE SARS-CoV-2 target nucleic acids are NOT DETECTED. The SARS-CoV-2 RNA is generally detectable in upper and lower  respiratory specimens during the acute phase of infection. The lowest  concentration of SARS-CoV-2 viral copies this assay can detect is 250  copies / mL. A negative result does not  preclude SARS-CoV-2 infection  and should not be used as the sole basis for treatment or other  patient management decisions.  A negative result may occur with  improper specimen collection / handling, submission of specimen other  than nasopharyngeal swab, presence of viral mutation(s) within the  areas targeted by this assay, and inadequate number of viral copies  (<250 copies / mL). A negative result must be combined with clinical  observations, patient history, and epidemiological information. If result is POSITIVE SARS-CoV-2 target nucleic acids are DETECTED. The SARS-CoV-2 RNA is generally detectable in upper and lower  respiratory specimens dur ing the acute phase of infection.  Positive  results are indicative of active infection with SARS-CoV-2.  Clinical  correlation with patient history and other diagnostic information is  necessary to determine patient infection status.  Positive results do  not rule out bacterial infection or co-infection with other viruses. If result is PRESUMPTIVE POSTIVE SARS-CoV-2 nucleic acids MAY BE PRESENT.   A presumptive positive result was  obtained on the submitted specimen  and confirmed on repeat testing.  While 2019 novel coronavirus  (SARS-CoV-2) nucleic acids may be present in the submitted sample  additional confirmatory testing may be necessary for epidemiological  and / or clinical management purposes  to differentiate between  SARS-CoV-2 and other Sarbecovirus currently known to infect humans.  If clinically indicated additional testing with an alternate test  methodology [phone]) is advised. The SARS-CoV-2 RNA is generally  detectable in upper and lower respiratory sp ecimens during the acute  phase of infection. The expected result is Negative. Fact Sheet for Patients:  StrictlyIdeas.no Fact Sheet for Healthcare Providers: BankingDealers.co.za This test is not yet approved or cleared by the [place] FDA and has been authorized for detection and/or diagnosis of SARS-CoV-2 by FDA under an Emergency Use Authorization (EUA).  This EUA will remain in effect (meaning this test can be used) for the duration of the COVID-19 declaration under Section 564(b)(1) of the Act, 21 U.S.C. section 360bbb-3(b)(1), unless the authorization is terminated or revoked sooner. Performed at [facility], [place] [address] [place], [place] [address]   Blood Culture (routine x 2)     Status: None (Preliminary result)   Collection Time: 07/24/19  7:54 PM   Specimen: BLOOD  Result Value Ref Range Status   Specimen Description   Final    BLOOD LEFT ANTECUBITAL Performed at [facility] [address], [place], [place] [address]    Special Requests   Final    Blood Culture adequate volume BOTTLES DRAWN AEROBIC AND ANAEROBIC Performed at [facility] [address], [place], [place] [address]    Culture   Final    NO GROWTH 3 DAYS Performed at [facility], [place] [address] [place], [place] [address]    Report Status  PENDING  Incomplete  Urine culture     Status: None   Collection Time: 07/24/19  7:54 PM   Specimen: In/Out Cath Urine  Result Value Ref Range Status   Specimen Description   Final    IN/OUT CATH URINE Performed at [facility] [address] [place], [place] [address]    Special Requests   Final    NONE Performed at [facility], [place] [address], [place], [place] [address]    Culture   Final    NO GROWTH Performed at [facility], [place] [address], [place], [place] [address]  Report Status 07/26/2019 FINAL  Final  Blood Culture (routine x 2)     Status: None (Preliminary result)   Collection Time: 07/24/19  7:59 PM   Specimen: BLOOD  Result Value Ref Range Status   Specimen Description   Final    BLOOD BLOOD LEFT WRIST Performed at [facility] [address] [place], [place] [address]    Special Requests   Final    BOTTLES DRAWN AEROBIC AND ANAEROBIC Blood Culture results may not be optimal due to an inadequate volume of blood received in culture bottles Performed at [facility] [address], [place], [place] [address]    Culture   Final    NO GROWTH 3 DAYS Performed at [facility], [place] [address], [place], [place] [address]    Report Status PENDING  Incomplete      Radiology Studies: Ct Angio Chest Pe W Or Wo Contrast  Result Date: 07/27/2019 CLINICAL DATA:  53 year old female with a history of shortness of breath EXAM: CT ANGIOGRAPHY CHEST WITH CONTRAST TECHNIQUE: Multidetector CT imaging of the chest was performed using the standard protocol during bolus administration of intravenous contrast. Multiplanar CT image reconstructions and MIPs were obtained to evaluate the vascular anatomy. CONTRAST:  47mL OMNIPAQUE IOHEXOL 350 MG/ML SOLN COMPARISON:  January 25, 2013 FINDINGS: Cardiovascular: Heart: Enlargement of the heart. Trace pericardial fluid/thickening. Calcifications of  the left anterior descending coronary artery. Aorta: Unremarkable course, caliber, contour of the thoracic aorta. No aneurysm or dissection flap. No periaortic fluid. Pulmonary arteries: Diameter of the main pulmonary artery measures 3.9 cm. Respiratory motion and timing of the contrast bolus somewhat limits evaluation of the pulmonary arteries. No filling defects within the main pulmonary artery, lobar arteries, or the segmental arteries. Beyond this the study is limited. Mediastinum/Nodes: Lymph nodes of the bilateral axillary region. Index node on the right measures 11 mm. This is not significantly changed from the comparison CT of 2014. Small lymph nodes throughout the mediastinum including prevascular AP window and the left hilar region. Unremarkable appearance of the thoracic esophagus. Hiatal hernia. Unremarkable thoracic inlet. Lungs/Pleura: Nodular confluent opacity of the lingula, anterior and posterior. Interlobular septal thickening. No significant endotracheal or endobronchial debris. CT air bronchograms sign present within the lingula. Small left-sided pleural effusion. No evidence of interlobular septal thickening. Centrilobular and paraseptal emphysema. Upper Abdomen: No acute. Musculoskeletal: No acute displaced fracture. Degenerative changes of the spine. Review of the MIP images confirms the above findings. IMPRESSION: The study is negative for pulmonary emboli, though somewhat limited in the evaluation of distal vasculature by respiratory motion and the timing of the contrast bolus. Left upper lobe lobar pneumonia with reactive mediastinal lymphadenopathy and parapneumonic effusion. Follow-up study is recommended once the patient has been treated to assure resolution, given the apparent background risk factors. Emphysema. Cardiomegaly. Enlargement of the main pulmonary artery, suggesting developing pulmonary hypertension. Borderline enlarged lymph nodes of the bilateral axillary regions,  unchanged since the prior CT of 2014. If there is concern for lymphoproliferative disorder, correlation with lab values may be useful. Electronically Signed   By: Corrie Mckusick D.O.   On: 07/27/2019 16:15      Scheduled Meds: . enoxaparin (LOVENOX) injection  40 mg Subcutaneous Q24H  . nicotine  14 mg Transdermal Daily   Continuous Infusions: . azithromycin 500 mg (07/28/19 0441)  . cefTRIAXone (ROCEPHIN)  IV 2 g (07/28/19 0858)     LOS: 3 days      Time  spent: 35 minutes   Dessa Phi, DO [facility] [email or web address] 07/28/2019, 11:42 AM

## 2019-07-28 NOTE — Progress Notes (Signed)
   07/28/19 2150  MEWS Assessment  Is this an acute change? Yes  MEWS guidelines implemented *See Row Information* Yellow  Provider Notification  Provider Name/Title Jeannette Corpus, NP  Date Provider Notified 07/28/19  Time Provider Notified 2150  Notification Type Call  Notification Reason Change in status  Response See new orders  Date of Provider Response 07/28/19  Time of Provider Response 2150    Vital Signs MEWS/VS Documentation       07/28/2019 0440 07/28/2019 0745 07/28/2019 1408 07/28/2019 2139   MEWS Score:  0  0  1  2   MEWS Score Color:  Green  Green  Green  Yellow   Resp:  15  --  16  18   Pulse:  86  --  (!) 103  (!) 103   BP:  136/76  --  (!) 141/84  (!) 151/85   Temp:  98.5 F (36.9 C)  --  98.9 F (37.2 C)  (!) 100.9 F (38.3 C)   O2 Device:  Nasal Cannula  --  Nasal Cannula  Nasal Cannula   O2 Flow Rate (L/min):  2 L/min  --  2 L/min  2 L/min   Level of Consciousness:  --  Alert  --  --            Lilli Few 07/28/2019,9:50 PM

## 2019-07-28 NOTE — Progress Notes (Signed)
PULMONARY / CRITICAL CARE MEDICINE   NAME:  Tammy Cordova, MRN:  [id number], DOB:  10-23-1966, LOS: 3 ADMISSION DATE:  07/24/2019, CONSULTATION DATE:  07/28/19 REFERRING MD:  [facility]/ [facility], CHIEF COMPLAINT:  CAP with small L effusion  BRIEF HISTORY:    52  yobf active smoker with doe x summer 2019 progressively worse then abruptly ill 07/15/19  Severe cough/ vomiting then pain belowe L breast on lying down > admitted 8.24/20 with dx of CAP / resp failure rx with Zmax/rocephin but no better by 8/27 with low grade fever and desat so CTa chest done with multilobar as dz mostly LUL and small L effusion and PCCM consulted am 8/28   HISTORY OF PRESENT ILLNESS   Active smoker but excellent ex tol, no chronic symptoms until indolent onset doe to point of [facility] = can't walk a nl pace on a flat grade s sob but does fine slow and flat but did not have any w/u she can remember then abruptly ill as above first with really bad sense of HB with cough/vomit.   On eval 07/28/2019  L positional cp under L breast better but gen cp ant from coughing No longer N or V and cough remains dry.  Some chills but no rigors with intial  Tmax 102.7 but only around 100 since rx with abx  No obvious day to day or daytime variability or assoc excess/ purulent sputum or mucus plugs or hemoptysis or   chest tightness, subjective wheeze or overt sinus     Also denies any obvious fluctuation of symptoms with weather or environmental changes or other aggravating or alleviating factors except as outlined above   No unusual exposure hx or h/o childhood pna/ asthma or knowledge of premature birth.  Current Allergies, Complete Past Medical History, Past Surgical History, Family History, and Social History were reviewed in [employer] record.  ROS  The following are not active complaints unless bolded Hoarseness, sore throat, dysphagia, dental problems, itching, sneezing,  nasal congestion or discharge of  excess mucus or purulent secretions, ear ache,   fever, chills, sweats, unintended wt loss or wt gain, classically pleuritic or exertional cp,  orthopnea pnd or arm/hand swelling  or leg swelling, presyncope, palpitations, abdominal pain, anorexia, nausea, vomiting, diarrhea  or change in bowel habits or change in bladder habits, change in stools or change in urine, dysuria, hematuria,  rash, arthralgias, visual complaints, headache, numbness, weakness or ataxia or problems with walking or coordination,  change in mood or  memory.             STUDIES:   CTa 07/27/19  The study is negative for pulmonary emboli, though somewhat limited  Left upper lobe lobar pneumonia (with subtle air bronchograms) with reactive mediastinal lymphadenopathy and parapneumonic effusion.  Emphysema. Cardiomegaly. Enlargement of the main pulmonary artery, suggesting developing pulmonary hypertension.   CULTURES:  BC 8/24 x 2  UC 8/24 > NG  COVID 19 PCR  8/24 neg   ANTIBIOTICS:  Maxepime 8/24 only Zmax 8/24 Roc  8/25       SUBJECTIVE:  gen cp ant from cough, no sob, no HB and  no longer any N or V, drinking fluids ok   CONSTITUTIONAL: BP 136/76 (BP Location: Right Arm)   Pulse 86   Temp 98.5 F (36.9 C) (Oral)   Resp 15   Ht 5\' 7"  (1.702 m)   Wt 72.6 kg   LMP  (LMP Unknown)   SpO2 96%  BMI 25.06 kg/m   I/O last 3 completed shifts: In: 2746.8 [P.O.:1560; IV Piggyback:1186.8] Out: -         PHYSICAL EXAM: General:  nad at rest upright on 2lpm np  Neuro:  Alert, no def HEENT:  orophx clear Cardiovascular:  RRR no s3 Lungs:  A few coarse bs bilaterally esp on L but no wheeze or classic egophony/bronchial changes or localized wheee Abdomen:  Soft, benign Musculoskeletal:  No clubbing  Skin:  No rash    ASSESSMENT AND PLAN    1)  Multilobar CAP mostly in LUL already on approp rx with zmax/rocephin   2) Underlying copd with some emphysematous features on CTa  8/27  - quit  smoking on admit, will see if it holds up  3) very small L Parapneumonic effusion on the L, no need to tap  4) 02 dep acute resp failure secondary to 1-3 should resolve by d/c  5) Mild normocytic anemia may have contributed to pre admit doe as well   6) MSCP vs GERD vs both from severe cough > rx ppi/mucinex dm    Will need f/u cxr in a few weeks to be sure clears with DDx  BOOP and lung ca much less likely given presence of air bronchograms (see this in BAC only, never with post obst pna)    >>> also will need pfts as outpt,happy to see and arrange both cxr and pfts at Dr Quin Hoop discretion.     LABS  Glucose No results for input(s): GLUCAP in the last 168 hours.  BMET Recent Labs  Lab 07/26/19 0525 07/27/19 0457 07/28/19 0451  NA 136 134* 137  K 3.3* 3.3* 3.5  CL 106 98 103  CO2 21* 23 25  BUN <5* 5* 5*  CREATININE 0.49 0.46 0.41*  GLUCOSE 107* 107* 107*    Liver Enzymes Recent Labs  Lab 07/24/19 1954  AST 30  ALT 21  ALKPHOS 88  BILITOT 1.0  ALBUMIN 2.9*    Electrolytes Recent Labs  Lab 07/26/19 0525 07/27/19 0457 07/28/19 0451  CALCIUM 7.9* 8.0* 8.1*  MG  --  1.8  --     CBC Recent Labs  Lab 07/26/19 0525 07/27/19 0457 07/28/19 0451  WBC 7.7 9.4 8.8  HGB 9.2* 9.1* 9.1*  HCT 30.2* 29.8* 29.1*  PLT 348 372 367    ABG No results for input(s): PHART, PCO2ART, PO2ART in the last 168 hours.  Coag's Recent Labs  Lab 07/24/19 1954  APTT 44*  INR 1.1    Sepsis Markers Recent Labs  Lab 07/24/19 2154  LATICACIDVEN 0.7    Cardiac Enzymes No results for input(s): TROPONINI, PROBNP in the last 168 hours.  PAST MEDICAL HISTORY :   She  has a past medical history of Pneumonia.  PAST SURGICAL HISTORY:  She  has a past surgical history that includes Bunionectomy and Breast biopsy (Left, 04/2016).  No Known Allergies  No current facility-administered medications on file prior to encounter.    Current Outpatient Medications  on File Prior to Encounter  Medication Sig  . acetaminophen (TYLENOL) 325 MG tablet Take 650 mg by mouth every 6 (six) hours as needed for moderate pain or headache.  Marland Kitchen BEPREVE 1.5 % SOLN Place 1 drop into both eyes every 12 (twelve) hours as needed for allergies.  Marland Kitchen Cod Liver Oil CAPS Take 1 capsule by mouth daily.  Marland Kitchen ibuprofen (ADVIL,MOTRIN) 200 MG tablet Take 200 mg by mouth every 6 (six) hours  as needed for pain (pain).  Marland Kitchen azithromycin (ZITHROMAX) 250 MG tablet Take 1 tablet (250 mg total) by mouth daily. Take first 2 tablets together, then 1 every day until finished. (Patient not taking: Reported on 07/24/2019)  . ibuprofen (ADVIL,MOTRIN) 600 MG tablet Take 1 tablet (600 mg total) by mouth every 6 (six) hours as needed for pain. (Patient not taking: Reported on 07/24/2019)  . methocarbamol (ROBAXIN) 500 MG tablet Take 1 tablet (500 mg total) by mouth 2 (two) times daily. (Patient not taking: Reported on 07/24/2019)  . naproxen (NAPROSYN) 500 MG tablet Take 1 tablet (500 mg total) by mouth 2 (two) times daily with a meal. (Patient not taking: Reported on 07/24/2019)    FAMILY HISTORY:   Her family history includes Breast cancer (age of onset: 62) in her paternal grandmother.  SOCIAL HISTORY:  She  reports that she has been smoking cigarettes. She has been smoking about 0.50 packs per day. She has never used smokeless tobacco. She reports current drug use. Drug: Marijuana. She reports that she does not drink alcohol.        Christinia Gully, MD Pulmonary and [place] [phone] After 5:30 PM or weekends, use [facility] [phone]

## 2019-07-29 LAB — CBC
HCT: 29.1 % — ABNORMAL LOW (ref 36.0–46.0)
Hemoglobin: 8.9 g/dL — ABNORMAL LOW (ref 12.0–15.0)
MCH: 25.1 pg — ABNORMAL LOW (ref 26.0–34.0)
MCHC: 30.6 g/dL (ref 30.0–36.0)
MCV: 82 fL (ref 80.0–100.0)
Platelets: 416 10*3/uL — ABNORMAL HIGH (ref 150–400)
RBC: 3.55 MIL/uL — ABNORMAL LOW (ref 3.87–5.11)
RDW: 17.7 % — ABNORMAL HIGH (ref 11.5–15.5)
WBC: 8.4 10*3/uL (ref 4.0–10.5)
nRBC: 0 % (ref 0.0–0.2)

## 2019-07-29 LAB — CULTURE, BLOOD (ROUTINE X 2)
Culture: NO GROWTH
Culture: NO GROWTH
Special Requests: ADEQUATE

## 2019-07-29 LAB — BASIC METABOLIC PANEL
Anion gap: 10 (ref 5–15)
BUN: 5 mg/dL — ABNORMAL LOW (ref 6–20)
CO2: 25 mmol/L (ref 22–32)
Calcium: 8.3 mg/dL — ABNORMAL LOW (ref 8.9–10.3)
Chloride: 103 mmol/L (ref 98–111)
Creatinine, Ser: 0.43 mg/dL — ABNORMAL LOW (ref 0.44–1.00)
GFR calc Af Amer: >60 mL/min (ref >60)
GFR calc non Af Amer: >60 mL/min (ref >60)
Glucose, Bld: 106 mg/dL — ABNORMAL HIGH (ref 70–99)
Potassium: 3.4 mmol/L — ABNORMAL LOW (ref 3.5–5.1)
Sodium: 138 mmol/L (ref 135–145)

## 2019-07-29 LAB — SEDIMENTATION RATE: Sed Rate: 109 mm/hr — ABNORMAL HIGH (ref 0–22)

## 2019-07-29 MED ORDER — SODIUM CHLORIDE 0.9 % IV SOLN
500.0000 mg | Freq: Every day | INTRAVENOUS | Status: DC
  Administered 2019-07-30: 500 mg via INTRAVENOUS
  Filled 2019-07-29: qty 500

## 2019-07-29 MED ORDER — POTASSIUM CHLORIDE CRYS ER 20 MEQ PO TBCR
40.0000 meq | EXTENDED_RELEASE_TABLET | Freq: Once | ORAL | Status: AC
  Administered 2019-07-29: 40 meq via ORAL
  Filled 2019-07-29: qty 2

## 2019-07-29 MED ORDER — SODIUM CHLORIDE 0.9 % IV SOLN
2.0000 g | INTRAVENOUS | Status: DC
  Administered 2019-07-30: 2 g via INTRAVENOUS
  Filled 2019-07-29: qty 2

## 2019-07-29 NOTE — Progress Notes (Signed)
**Note Tammy Cordova** PROGRESS NOTE    [name]  [id number] DOB: 1966/03/09 DOA: 07/24/2019 PCP: Kelton Pillar, MD     Brief Narrative:  Tammy Cordova is a 53 y.o.femalewith medical history significant ofpneumoniain 2014presenting to the hospital with complaints of coughand shortness of breath.Patient states for the past 3 days she is having fatigue, cough productive of clear sputum, and shortness of breath. She is also having chills. She is having slight frontal headaches since her symptoms started 3 days ago. No change in vision or focal weakness/numbness. No other complaints.   New events last 24 hours / Subjective: Febrile last night 100.9 around 9:30pm but otherwise doing well. Down to 2L [place] O2 and states she overall feels better since admission.   Assessment & Plan:   Principal Problem:   CAP (community acquired pneumonia) Active Problems:   Sepsis ([place])   Encounter for screening for HIV   Tobacco use   Headache   Centrilobular emphysema ([place])   Acute respiratory failure with hypoxemia ([place])   Sepsis secondary toLUL CAP with parapneumonic effusion -Sepsis present on admission. Tmax 102.7 F, tachycardic, and tachypneicon arrival -COVID-19 rapid test negative -Chest x-ray showing left lower lobe airspace disease suspicious for pneumonia -Blood cultures negative to date -Received vancomycin, cefepime, and metronidazole in the ED. Continue antibiotic coverage with ceftriaxone and azithromycin  -CTA chest revealed left upper lobe pneumonia, emphysema, parapneumonic effusion.  Appreciate PCCM consultation. Will need follow up imaging in few weeks.   Acute hypoxemic respiratory failure secondary to above -Currently on 2 L nasal cannula O2.  Wean as able to room air  Tobacco use -Cessation counseling, nicotine patch -Admits to smoking about half pack per day for many years  HIV screening -Nonreactive  Hypokalemia -Replace, trend        DVT prophylaxis: Lovenox   Code Status: Full code Family Communication: None Disposition Plan: Pending clinical improvement, wean oxygen to room air. Still having fevers, continue IV antibiotics.    Consultants:   PCCM  Procedures:   None  Antimicrobials:  Anti-infectives (From admission, onward)   Start     Dose/Rate Route Frequency Ordered Stop   07/25/19 1000  cefTRIAXone (ROCEPHIN) 2 g in sodium chloride 0.9 % 100 mL IVPB     2 g 200 mL/hr over 30 Minutes Intravenous Every 24 hours 07/25/19 0101 07/29/19 1110   07/25/19 0130  azithromycin (ZITHROMAX) 500 mg in sodium chloride 0.9 % 250 mL IVPB     500 mg 250 mL/hr over 60 Minutes Intravenous Daily at bedtime 07/25/19 0101 07/29/19 0627   07/24/19 2100  ceFEPIme (MAXIPIME) 2 g in sodium chloride 0.9 % 100 mL IVPB     2 g 200 mL/hr over 30 Minutes Intravenous  Once 07/24/19 2053 07/24/19 2251   07/24/19 2100  metroNIDAZOLE (FLAGYL) IVPB 500 mg     500 mg 100 mL/hr over 60 Minutes Intravenous  Once 07/24/19 2053 07/24/19 2252   07/24/19 2100  vancomycin (VANCOCIN) IVPB 1000 mg/200 mL premix     1,000 mg 200 mL/hr over 60 Minutes Intravenous  Once 07/24/19 2053 07/25/19 0107       Objective: Vitals:   07/28/19 2326 07/29/19 0129 07/29/19 0518 07/29/19 0919  BP: (!) 158/93 (!) 155/90 (!) 142/77 139/83  Pulse: (!) 105 100 95 91  Resp: (!) 24 (!) 24 (!) 28 18  Temp: 99.8 F (37.7 C) 99.2 F (37.3 C) 100.1 F (37.8 C) 98.9 F (37.2 C)  TempSrc: Oral Oral Oral Oral  SpO2: 95%  96% 94% 97%  Weight:      Height:        Intake/Output Summary (Last 24 hours) at 07/29/2019 1315 Last data filed at 07/29/2019 0902 Gross per 24 hour  Intake 500.07 ml  Output -  Net 500.07 ml   Filed Weights   07/24/19 1937  Weight: 72.6 kg    Examination: General exam: Appears calm and comfortable  Respiratory system: Bilateral crackles without wheeze. On 2L [place] O2 Cardiovascular system: S1 & S2 heard, RRR. No JVD, murmurs, rubs, gallops or clicks. No pedal  edema. Gastrointestinal system: Abdomen is nondistended, soft and nontender. No organomegaly or masses felt. Normal bowel sounds heard. Central nervous system: Alert and oriented. No focal neurological deficits. Extremities: Symmetric 5 x 5 power. Skin: No rashes, lesions or ulcers Psychiatry: Judgement and insight appear normal. Mood & affect appropriate.   Data Reviewed: I have personally reviewed following labs and imaging studies  CBC: Recent Labs  Lab 07/24/19 1954 07/25/19 0523 07/26/19 0525 07/27/19 0457 07/28/19 0451 07/29/19 0526  WBC 10.7* 7.9 7.7 9.4 8.8 8.4  NEUTROABS 9.3*  --  6.4  --   --   --   HGB 10.8* 9.6* 9.2* 9.1* 9.1* 8.9*  HCT 34.9* 31.8* 30.2* 29.8* 29.1* 29.1*  MCV 82.3 83.0 83.2 82.8 81.7 82.0  PLT 394 347 348 372 [id number]*   Basic Metabolic Panel: Recent Labs  Lab 07/24/19 1954 07/26/19 0525 07/27/19 0457 07/28/19 0451 07/29/19 0526  NA 135 136 134* 137 138  K 3.6 3.3* 3.3* 3.5 3.4*  CL 102 106 98 103 103  CO2 21* 21* 23 25 25   GLUCOSE 123* 107* 107* 107* 106*  BUN 14 <5* 5* 5* 5*  CREATININE 0.81 0.49 0.46 0.41* 0.43*  CALCIUM 8.4* 7.9* 8.0* 8.1* 8.3*  MG  --   --  1.8  --   --    GFR: Estimated Creatinine Clearance: 80 mL/min (A) (by C-G formula based on SCr of 0.43 mg/dL (L)). Liver Function Tests: Recent Labs  Lab 07/24/19 1954  AST 30  ALT 21  ALKPHOS 88  BILITOT 1.0  PROT 8.7*  ALBUMIN 2.9*   No results for input(s): LIPASE, AMYLASE in the last 168 hours. No results for input(s): AMMONIA in the last 168 hours. Coagulation Profile: Recent Labs  Lab 07/24/19 1954  INR 1.1   Cardiac Enzymes: No results for input(s): CKTOTAL, CKMB, CKMBINDEX, TROPONINI in the last 168 hours. BNP (last 3 results) No results for input(s): PROBNP in the last 8760 hours. HbA1C: No results for input(s): HGBA1C in the last 72 hours. CBG: No results for input(s): GLUCAP in the last 168 hours. Lipid Profile: No results for input(s): CHOL,  HDL, LDLCALC, TRIG, CHOLHDL, LDLDIRECT in the last 72 hours. Thyroid Function Tests: No results for input(s): TSH, T4TOTAL, FREET4, T3FREE, THYROIDAB in the last 72 hours. Anemia Panel: No results for input(s): VITAMINB12, FOLATE, FERRITIN, TIBC, IRON, RETICCTPCT in the last 72 hours. Sepsis Labs: Recent Labs  Lab 07/24/19 2154  LATICACIDVEN 0.7    Recent Results (from the past 240 hour(s))  SARS Coronavirus 2 [facility] order, Performed in [facility] hospital lab) Nasopharyngeal Nasopharyngeal Swab     Status: None   Collection Time: 07/24/19  7:52 PM   Specimen: Nasopharyngeal Swab  Result Value Ref Range Status   SARS Coronavirus 2 NEGATIVE NEGATIVE Final    Comment: (NOTE) If result is NEGATIVE SARS-CoV-2 target nucleic acids are NOT DETECTED. The SARS-CoV-2 RNA is generally detectable  in upper and lower  respiratory specimens during the acute phase of infection. The lowest  concentration of SARS-CoV-2 viral copies this assay can detect is 250  copies / mL. A negative result does not preclude SARS-CoV-2 infection  and should not be used as the sole basis for treatment or other  patient management decisions.  A negative result may occur with  improper specimen collection / handling, submission of specimen other  than nasopharyngeal swab, presence of viral mutation(s) within the  areas targeted by this assay, and inadequate number of viral copies  (<250 copies / mL). A negative result must be combined with clinical  observations, patient history, and epidemiological information. If result is POSITIVE SARS-CoV-2 target nucleic acids are DETECTED. The SARS-CoV-2 RNA is generally detectable in upper and lower  respiratory specimens dur ing the acute phase of infection.  Positive  results are indicative of active infection with SARS-CoV-2.  Clinical  correlation with patient history and other diagnostic information is  necessary to determine patient infection status.  Positive  results do  not rule out bacterial infection or co-infection with other viruses. If result is PRESUMPTIVE POSTIVE SARS-CoV-2 nucleic acids MAY BE PRESENT.   A presumptive positive result was obtained on the submitted specimen  and confirmed on repeat testing.  While 2019 novel coronavirus  (SARS-CoV-2) nucleic acids may be present in the submitted sample  additional confirmatory testing may be necessary for epidemiological  and / or clinical management purposes  to differentiate between  SARS-CoV-2 and other Sarbecovirus currently known to infect humans.  If clinically indicated additional testing with an alternate test  methodology [id number]) is advised. The SARS-CoV-2 RNA is generally  detectable in upper and lower respiratory sp ecimens during the acute  phase of infection. The expected result is Negative. Fact Sheet for Patients:  StrictlyIdeas.no Fact Sheet for Healthcare Providers: BankingDealers.co.za This test is not yet approved or cleared by the [place] FDA and has been authorized for detection and/or diagnosis of SARS-CoV-2 by FDA under an Emergency Use Authorization (EUA).  This EUA will remain in effect (meaning this test can be used) for the duration of the COVID-19 declaration under Section 564(b)(1) of the Act, 21 U.S.C. section 360bbb-3(b)(1), unless the authorization is terminated or revoked sooner. Performed at [facility], [place] [address] [place], [place] [address]   Blood Culture (routine x 2)     Status: None (Preliminary result)   Collection Time: 07/24/19  7:54 PM   Specimen: BLOOD  Result Value Ref Range Status   Specimen Description   Final    BLOOD LEFT ANTECUBITAL Performed at [facility] [address] [place], [place] [address]    Special Requests   Final    Blood Culture adequate volume BOTTLES DRAWN AEROBIC AND ANAEROBIC Performed at [facility] [address], [place], [place] [address]    Culture   Final    NO GROWTH 4 DAYS Performed at [facility], [place] [address], [place], [place] [address]    Report Status PENDING  Incomplete  Urine culture     Status: None   Collection Time: 07/24/19  7:54 PM   Specimen: In/Out Cath Urine  Result Value Ref Range Status   Specimen Description   Final    IN/OUT CATH URINE Performed at [facility] [address], [place],  [address]    Special Requests   Final    NONE Performed at [facility]  [place] [address], [place], [place] [address]    Culture   Final    NO GROWTH Performed at [facility], [place] [address], [place], [place] [address]    Report Status 07/26/2019 FINAL  Final  Blood Culture (routine x 2)     Status: None (Preliminary result)   Collection Time: 07/24/19  7:59 PM   Specimen: BLOOD  Result Value Ref Range Status   Specimen Description   Final    BLOOD BLOOD LEFT WRIST Performed at [facility] [address], [place], [place] [address]    Special Requests   Final    BOTTLES DRAWN AEROBIC AND ANAEROBIC Blood Culture results may not be optimal due to an inadequate volume of blood received in culture bottles Performed at [facility] [address], [place], [place] [address]    Culture   Final    NO GROWTH 4 DAYS Performed at [facility], [place] [address], [place], [place] [address]    Report Status PENDING  Incomplete      Radiology Studies: Ct Angio Chest Pe W Or Wo Contrast  Result Date: 07/27/2019 CLINICAL DATA:  53 year old female with a history of shortness of breath EXAM: CT ANGIOGRAPHY CHEST WITH CONTRAST TECHNIQUE: Multidetector CT imaging of the chest was performed using the standard protocol during bolus administration of intravenous contrast. Multiplanar CT image reconstructions and MIPs were obtained to evaluate the vascular anatomy.  CONTRAST:  83mL OMNIPAQUE IOHEXOL 350 MG/ML SOLN COMPARISON:  January 25, 2013 FINDINGS: Cardiovascular: Heart: Enlargement of the heart. Trace pericardial fluid/thickening. Calcifications of the left anterior descending coronary artery. Aorta: Unremarkable course, caliber, contour of the thoracic aorta. No aneurysm or dissection flap. No periaortic fluid. Pulmonary arteries: Diameter of the main pulmonary artery measures 3.9 cm. Respiratory motion and timing of the contrast bolus somewhat limits evaluation of the pulmonary arteries. No filling defects within the main pulmonary artery, lobar arteries, or the segmental arteries. Beyond this the study is limited. Mediastinum/Nodes: Lymph nodes of the bilateral axillary region. Index node on the right measures 11 mm. This is not significantly changed from the comparison CT of 2014. Small lymph nodes throughout the mediastinum including prevascular AP window and the left hilar region. Unremarkable appearance of the thoracic esophagus. Hiatal hernia. Unremarkable thoracic inlet. Lungs/Pleura: Nodular confluent opacity of the lingula, anterior and posterior. Interlobular septal thickening. No significant endotracheal or endobronchial debris. CT air bronchograms sign present within the lingula. Small left-sided pleural effusion. No evidence of interlobular septal thickening. Centrilobular and paraseptal emphysema. Upper Abdomen: No acute. Musculoskeletal: No acute displaced fracture. Degenerative changes of the spine. Review of the MIP images confirms the above findings. IMPRESSION: The study is negative for pulmonary emboli, though somewhat limited in the evaluation of distal vasculature by respiratory motion and the timing of the contrast bolus. Left upper lobe lobar pneumonia with reactive mediastinal lymphadenopathy and parapneumonic effusion. Follow-up study is recommended once the patient has been treated to assure resolution, given the apparent background risk  factors. Emphysema. Cardiomegaly. Enlargement of the main pulmonary artery, suggesting developing pulmonary hypertension. Borderline enlarged lymph nodes of the bilateral axillary regions, unchanged since the prior CT of 2014. If there is concern for lymphoproliferative disorder, correlation with lab values may be useful. Electronically Signed   By: Corrie Mckusick D.O.   On: 07/27/2019 16:15      Scheduled Meds: . enoxaparin (LOVENOX) injection  40 mg Subcutaneous Q24H  . nicotine  14 mg Transdermal Daily  . pantoprazole  40 mg Oral BID AC   Continuous Infusions:    LOS: 4 days      Time spent: 25 minutes   Dessa Phi, DO [facility] [email or web address] 07/29/2019, 1:15 PM

## 2019-07-30 LAB — BASIC METABOLIC PANEL
Anion gap: 8 (ref 5–15)
BUN: 5 mg/dL — ABNORMAL LOW (ref 6–20)
CO2: 26 mmol/L (ref 22–32)
Calcium: 8.4 mg/dL — ABNORMAL LOW (ref 8.9–10.3)
Chloride: 106 mmol/L (ref 98–111)
Creatinine, Ser: 0.46 mg/dL (ref 0.44–1.00)
GFR calc Af Amer: >60 mL/min (ref >60)
GFR calc non Af Amer: >60 mL/min (ref >60)
Glucose, Bld: 114 mg/dL — ABNORMAL HIGH (ref 70–99)
Potassium: 3.9 mmol/L (ref 3.5–5.1)
Sodium: 140 mmol/L (ref 135–145)

## 2019-07-30 LAB — CBC
HCT: 32.1 % — ABNORMAL LOW (ref 36.0–46.0)
Hemoglobin: 9.9 g/dL — ABNORMAL LOW (ref 12.0–15.0)
MCH: 25 pg — ABNORMAL LOW (ref 26.0–34.0)
MCHC: 30.8 g/dL (ref 30.0–36.0)
MCV: 81.1 fL (ref 80.0–100.0)
Platelets: 403 10*3/uL — ABNORMAL HIGH (ref 150–400)
RBC: 3.96 MIL/uL (ref 3.87–5.11)
RDW: 17.6 % — ABNORMAL HIGH (ref 11.5–15.5)
WBC: 5.4 10*3/uL (ref 4.0–10.5)
nRBC: 0 % (ref 0.0–0.2)

## 2019-07-30 MED ORDER — CEFDINIR 300 MG PO CAPS: 300.0000 mg | ORAL_CAPSULE | Freq: Two times a day (BID) | ORAL | 0 refills | Status: AC

## 2019-07-30 MED ORDER — AZITHROMYCIN 500 MG PO TABS: 500.0000 mg | ORAL_TABLET | Freq: Every day | ORAL | 0 refills | Status: AC

## 2019-07-30 MED ORDER — PANTOPRAZOLE SODIUM 40 MG PO TBEC: 40.0000 mg | DELAYED_RELEASE_TABLET | Freq: Two times a day (BID) | ORAL | 2 refills | Status: DC

## 2019-07-30 MED ORDER — AZITHROMYCIN 500 MG PO TABS: 500.0000 mg | ORAL_TABLET | Freq: Every day | ORAL | 0 refills | Status: DC

## 2019-07-30 NOTE — Discharge Summary (Addendum)
Physician Discharge Summary  Tammy Cordova [id number] DOB: [birth year] DOA: 07/24/2019  PCP: Kelton Pillar, MD  Admit date: 07/24/2019 Discharge date: 07/30/2019  Admitted From: Home Disposition:  Home   Recommendations for Outpatient Follow-up:  1. Follow up with PCP in 1 week 2. Follow up with Dr. Melvyn Novas in 2-3 weeks for repeat lung imaging post pneumonia treatment   Discharge Condition: Stable CODE STATUS: Full  Diet recommendation: Heart healthy   Brief/Interim Summary: Tammy Cordova is a 53 y.o.femalewith medical history significant ofpneumoniain 2014presenting to the hospital with complaints of coughand shortness of breath.Patient states for the past 3 days she is having fatigue, cough productive of clear sputum, and shortness of breath. She is also having chills. She is having slight frontal headaches since her symptoms started 3 days ago. No change in vision or focal weakness/numbness. No other complaints.  She was found to have left upper lobe pneumonia with parapneumonic effusion.  She was started on IV antibiotics, ceftriaxone and azithromycin.  Due to concern for parapneumonic effusion, pulmonology was also consulted during hospitalization.  During her hospitalization, patient continued to improve clinically.  On day of discharge, she had remained afebrile over 24 hours and feeling much better.  Discharge Diagnoses:  Principal Problem:   CAP (community acquired pneumonia) Active Problems:   Sepsis ([place])   Encounter for screening for HIV   Tobacco use   Headache   Centrilobular emphysema ([place])   Acute respiratory failure with hypoxemia ([place])   Sepsis secondary toLUL CAP with parapneumonic effusion -Sepsis present on admission. Tmax102.7 F, tachycardic, and tachypneicon arrival -COVID-19 rapid test negative -Chest x-ray showing left lower lobe airspace disease suspicious for pneumonia -Blood cultures negative to date -Received vancomycin, cefepime, and  metronidazole in the ED. Continued on antibiotic coverage with ceftriaxone and azithromycin --> Omnicef and azithromycin on discharge to complete total 10 day treatment  -CTA chest revealed left upper lobe pneumonia, emphysema, parapneumonic effusion.  Appreciate PCCM consultation. Will need follow up imaging in few weeks.   Acute hypoxemic respiratory failure secondary to above -Weaned to room air. Did not qualify for home O2 on discharge.   Tobacco use -Cessation counseling, nicotine patch -Admits to smoking about half pack per day for many years  HIV screening -Nonreactive    Discharge Instructions  Discharge Instructions    Call MD for:  difficulty breathing, headache or visual disturbances   Complete by: As directed    Call MD for:  extreme fatigue   Complete by: As directed    Call MD for:  persistant dizziness or light-headedness   Complete by: As directed    Call MD for:  persistant nausea and vomiting   Complete by: As directed    Call MD for:  severe uncontrolled pain   Complete by: As directed    Call MD for:  temperature >100.4   Complete by: As directed    Diet - low sodium heart healthy   Complete by: As directed    Discharge instructions   Complete by: As directed    You were cared for by a hospitalist during your hospital stay. If you have any questions about your discharge medications or the care you received while you were in the hospital after you are discharged, you can call the unit and ask to speak with the hospitalist on call if the hospitalist that took care of you is not available. Once you are discharged, your primary care physician will handle any further medical issues. Please note that  NO REFILLS for any discharge medications will be authorized once you are discharged, as it is imperative that you return to your primary care physician (or establish a relationship with a primary care physician if you do not have one) for your aftercare needs so that  they can reassess your need for medications and monitor your lab values.   Increase activity slowly   Complete by: As directed      Allergies as of 07/30/2019   No Known Allergies     Medication List    STOP taking these medications   methocarbamol 500 MG tablet Commonly known as: ROBAXIN   naproxen 500 MG tablet Commonly known as: Naprosyn     TAKE these medications   acetaminophen 325 MG tablet Commonly known as: TYLENOL Take 650 mg by mouth every 6 (six) hours as needed for moderate pain or headache.   azithromycin 500 MG tablet Commonly known as: Zithromax Take 1 tablet (500 mg total) by mouth daily for 4 days. What changed:   medication strength  how much to take  additional instructions   Bepreve 1.5 % Soln Generic drug: Bepotastine Besilate Place 1 drop into both eyes every 12 (twelve) hours as needed for allergies.   cefdinir 300 MG capsule Commonly known as: OMNICEF Take 1 capsule (300 mg total) by mouth 2 (two) times daily for 4 days.   Cod Liver Oil Caps Take 1 capsule by mouth daily.   ibuprofen 200 MG tablet Commonly known as: ADVIL Take 200 mg by mouth every 6 (six) hours as needed for pain (pain). What changed: Another medication with the same name was removed. Continue taking this medication, and follow the directions you see here.      Follow-up Information    Kelton Pillar, MD. Schedule an appointment as soon as possible for a visit in 1 week(s).   Specialty: Family Medicine Contact information: [address] [place] [address] [phone]        Tanda Rockers, MD. Schedule an appointment as soon as possible for a visit in 2 week(s).   Specialty: Pulmonary Disease Contact information: [place] [address] [place] [address] [phone]          No Known Allergies  Consultations:  Pulmonology    Procedures/Studies: Ct Angio Chest Pe W Or Wo Contrast  Result Date: 07/27/2019 CLINICAL  DATA:  53 year old female with a history of shortness of breath EXAM: CT ANGIOGRAPHY CHEST WITH CONTRAST TECHNIQUE: Multidetector CT imaging of the chest was performed using the standard protocol during bolus administration of intravenous contrast. Multiplanar CT image reconstructions and MIPs were obtained to evaluate the vascular anatomy. CONTRAST:  73mL OMNIPAQUE IOHEXOL 350 MG/ML SOLN COMPARISON:  January 25, 2013 FINDINGS: Cardiovascular: Heart: Enlargement of the heart. Trace pericardial fluid/thickening. Calcifications of the left anterior descending coronary artery. Aorta: Unremarkable course, caliber, contour of the thoracic aorta. No aneurysm or dissection flap. No periaortic fluid. Pulmonary arteries: Diameter of the main pulmonary artery measures 3.9 cm. Respiratory motion and timing of the contrast bolus somewhat limits evaluation of the pulmonary arteries. No filling defects within the main pulmonary artery, lobar arteries, or the segmental arteries. Beyond this the study is limited. Mediastinum/Nodes: Lymph nodes of the bilateral axillary region. Index node on the right measures 11 mm. This is not significantly changed from the comparison CT of 2014. Small lymph nodes throughout the mediastinum including prevascular AP window and the left hilar region. Unremarkable appearance of the thoracic  esophagus. Hiatal hernia. Unremarkable thoracic inlet. Lungs/Pleura: Nodular confluent opacity of the lingula, anterior and posterior. Interlobular septal thickening. No significant endotracheal or endobronchial debris. CT air bronchograms sign present within the lingula. Small left-sided pleural effusion. No evidence of interlobular septal thickening. Centrilobular and paraseptal emphysema. Upper Abdomen: No acute. Musculoskeletal: No acute displaced fracture. Degenerative changes of the spine. Review of the MIP images confirms the above findings. IMPRESSION: The study is negative for pulmonary emboli, though  somewhat limited in the evaluation of distal vasculature by respiratory motion and the timing of the contrast bolus. Left upper lobe lobar pneumonia with reactive mediastinal lymphadenopathy and parapneumonic effusion. Follow-up study is recommended once the patient has been treated to assure resolution, given the apparent background risk factors. Emphysema. Cardiomegaly. Enlargement of the main pulmonary artery, suggesting developing pulmonary hypertension. Borderline enlarged lymph nodes of the bilateral axillary regions, unchanged since the prior CT of 2014. If there is concern for lymphoproliferative disorder, correlation with lab values may be useful. Electronically Signed   By: Corrie Mckusick D.O.   On: 07/27/2019 16:15   Dg Chest Port 1 View  Result Date: 07/24/2019 CLINICAL DATA:  Headache and cough, short of breath EXAM: PORTABLE CHEST 1 VIEW COMPARISON:  06/07/2013 FINDINGS: Left lower lobe airspace disease, suspicious for pneumonia. Right lung clear. Negative for heart failure or effusion. IMPRESSION: Left lower lobe airspace disease, suspicious for pneumonia. Electronically Signed   By: Franchot Gallo M.D.   On: 07/24/2019 21:19       Discharge Exam: Vitals:   07/29/19 2049 07/30/19 0544  BP: 139/84 (!) 144/92  Pulse: 94 85  Resp: 18   Temp: 98.9 F (37.2 C) 98.2 F (36.8 C)  SpO2: 96% 95%    General: Pt is alert, awake, not in acute distress Cardiovascular: RRR, S1/S2 +, no edema Respiratory: Diminished breath sounds, no respiratory distress, no conversational dyspnea  Abdominal: Soft, NT, ND, bowel sounds + Extremities: no edema, no cyanosis Psych: Normal mood and affect, stable judgement and insight     The results of significant diagnostics from this hospitalization (including imaging, microbiology, ancillary and laboratory) are listed below for reference.     Microbiology: Recent Results (from the past 240 hour(s))  SARS Coronavirus 2 [facility] order, Performed in  [facility] hospital lab) Nasopharyngeal Nasopharyngeal Swab     Status: None   Collection Time: 07/24/19  7:52 PM   Specimen: Nasopharyngeal Swab  Result Value Ref Range Status   SARS Coronavirus 2 NEGATIVE NEGATIVE Final    Comment: (NOTE) If result is NEGATIVE SARS-CoV-2 target nucleic acids are NOT DETECTED. The SARS-CoV-2 RNA is generally detectable in upper and lower  respiratory specimens during the acute phase of infection. The lowest  concentration of SARS-CoV-2 viral copies this assay can detect is 250  copies / mL. A negative result does not preclude SARS-CoV-2 infection  and should not be used as the sole basis for treatment or other  patient management decisions.  A negative result may occur with  improper specimen collection / handling, submission of specimen other  than nasopharyngeal swab, presence of viral mutation(s) within the  areas targeted by this assay, and inadequate number of viral copies  (<250 copies / mL). A negative result must be combined with clinical  observations, patient history, and epidemiological information. If result is POSITIVE SARS-CoV-2 target nucleic acids are DETECTED. The SARS-CoV-2 RNA is generally detectable in upper and lower  respiratory specimens dur ing the acute phase of infection.  Positive  results are indicative of active infection with SARS-CoV-2.  Clinical  correlation with patient history and other diagnostic information is  necessary to determine patient infection status.  Positive results do  not rule out bacterial infection or co-infection with other viruses. If result is PRESUMPTIVE POSTIVE SARS-CoV-2 nucleic acids MAY BE PRESENT.   A presumptive positive result was obtained on the submitted specimen  and confirmed on repeat testing.  While 2019 novel coronavirus  (SARS-CoV-2) nucleic acids may be present in the submitted sample  additional confirmatory testing may be necessary for epidemiological  and / or clinical  management purposes  to differentiate between  SARS-CoV-2 and other Sarbecovirus currently known to infect humans.  If clinically indicated additional testing with an alternate test  methodology [phone]) is advised. The SARS-CoV-2 RNA is generally  detectable in upper and lower respiratory sp ecimens during the acute  phase of infection. The expected result is Negative. Fact Sheet for Patients:  StrictlyIdeas.no Fact Sheet for Healthcare Providers: BankingDealers.co.za This test is not yet approved or cleared by the [place] FDA and has been authorized for detection and/or diagnosis of SARS-CoV-2 by FDA under an Emergency Use Authorization (EUA).  This EUA will remain in effect (meaning this test can be used) for the duration of the COVID-19 declaration under Section 564(b)(1) of the Act, 21 U.S.C. section 360bbb-3(b)(1), unless the authorization is terminated or revoked sooner. Performed at [facility], [place] [address] [place], [place] [address]   Blood Culture (routine x 2)     Status: None   Collection Time: 07/24/19  7:54 PM   Specimen: BLOOD  Result Value Ref Range Status   Specimen Description   Final    BLOOD LEFT ANTECUBITAL Performed at [place] [address] [place], [place] [address]    Special Requests   Final    Blood Culture adequate volume BOTTLES DRAWN AEROBIC AND ANAEROBIC Performed at [place] [address], [place], [place] [address]    Culture   Final    NO GROWTH 5 DAYS Performed at [facility], [place] [address] [place], [place] [address]    Report Status 07/29/2019 FINAL  Final  Urine culture     Status: None   Collection Time: 07/24/19  7:54 PM   Specimen: In/Out Cath Urine  Result Value Ref Range Status   Specimen Description   Final    IN/OUT CATH URINE Performed at [place] [address]  [place], [place] [address]    Special Requests   Final    NONE Performed at [facility], [place] [address], [place], [place] [address]    Culture   Final    NO GROWTH Performed at [facility], [place] [address], [place], [place] [address]    Report Status 07/26/2019 FINAL  Final  Blood Culture (routine x 2)     Status: None   Collection Time: 07/24/19  7:59 PM   Specimen: BLOOD  Result Value Ref Range Status   Specimen Description   Final    BLOOD BLOOD LEFT WRIST Performed at [place] [address] [place], [place] [address]    Special Requests   Final    BOTTLES DRAWN AEROBIC AND ANAEROBIC Blood Culture results may not be optimal due to an inadequate volume of blood received in culture bottles Performed at [facility] [address] [place], [place] [address]  Culture   Final    NO GROWTH 5 DAYS Performed at [facility], [place] [address], [place], [place] [address]    Report Status 07/29/2019 FINAL  Final     Labs: BNP (last 3 results) No results for input(s): BNP in the last 8760 hours. Basic Metabolic Panel: Recent Labs  Lab 07/26/19 0525 07/27/19 0457 07/28/19 0451 07/29/19 0526 07/30/19 0537  NA 136 134* 137 138 140  K 3.3* 3.3* 3.5 3.4* 3.9  CL 106 98 103 103 106  CO2 21* 23 25 25 26   GLUCOSE 107* 107* 107* 106* 114*  BUN <5* 5* 5* 5* 5*  CREATININE 0.49 0.46 0.41* 0.43* 0.46  CALCIUM 7.9* 8.0* 8.1* 8.3* 8.4*  MG  --  1.8  --   --   --    Liver Function Tests: Recent Labs  Lab 07/24/19 1954  AST 30  ALT 21  ALKPHOS 88  BILITOT 1.0  PROT 8.7*  ALBUMIN 2.9*   No results for input(s): LIPASE, AMYLASE in the last 168 hours. No results for input(s): AMMONIA in the last 168 hours. CBC: Recent Labs  Lab 07/24/19 1954  07/26/19 0525 07/27/19 0457 07/28/19 0451 07/29/19 0526 07/30/19 0537  WBC 10.7*   < > 7.7 9.4 8.8 8.4 5.4  NEUTROABS 9.3*  --  6.4  --   --   --   --   HGB  10.8*   < > 9.2* 9.1* 9.1* 8.9* 9.9*  HCT 34.9*   < > 30.2* 29.8* 29.1* 29.1* 32.1*  MCV 82.3   < > 83.2 82.8 81.7 82.0 81.1  PLT 394   < > 348 372 367 416* 403*   < > = values in this interval not displayed.   Cardiac Enzymes: No results for input(s): CKTOTAL, CKMB, CKMBINDEX, TROPONINI in the last 168 hours. BNP: Invalid input(s): POCBNP CBG: No results for input(s): GLUCAP in the last 168 hours. D-Dimer No results for input(s): DDIMER in the last 72 hours. Hgb A1c No results for input(s): HGBA1C in the last 72 hours. Lipid Profile No results for input(s): CHOL, HDL, LDLCALC, TRIG, CHOLHDL, LDLDIRECT in the last 72 hours. Thyroid function studies No results for input(s): TSH, T4TOTAL, T3FREE, THYROIDAB in the last 72 hours.  Invalid input(s): FREET3 Anemia work up No results for input(s): VITAMINB12, FOLATE, FERRITIN, TIBC, IRON, RETICCTPCT in the last 72 hours. Urinalysis    Component Value Date/Time   COLORURINE YELLOW 07/24/2019 1954   APPEARANCEUR HAZY (A) 07/24/2019 1954   LABSPEC 1.017 07/24/2019 1954   PHURINE 5.0 07/24/2019 1954   GLUCOSEU NEGATIVE 07/24/2019 1954   HGBUR SMALL (A) 07/24/2019 [place] NEGATIVE 07/24/2019 [place] NEGATIVE 07/24/2019 1954   PROTEINUR 30 (A) 07/24/2019 1954   UROBILINOGEN 0.2 01/25/2013 0823   NITRITE NEGATIVE 07/24/2019 1954   LEUKOCYTESUR LARGE (A) 07/24/2019 1954   Sepsis Labs Invalid input(s): PROCALCITONIN,  WBC,  LACTICIDVEN Microbiology Recent Results (from the past 240 hour(s))  SARS Coronavirus 2 [facility] order, Performed in [facility] hospital lab) Nasopharyngeal Nasopharyngeal Swab     Status: None   Collection Time: 07/24/19  7:52 PM   Specimen: Nasopharyngeal Swab  Result Value Ref Range Status   SARS Coronavirus 2 NEGATIVE NEGATIVE Final    Comment: (NOTE) If result is NEGATIVE SARS-CoV-2 target nucleic acids are NOT DETECTED. The SARS-CoV-2 RNA is generally detectable in upper and lower   respiratory specimens during the acute phase of infection. The lowest  concentration of SARS-CoV-2 viral copies this assay can detect is 250  copies / mL. A negative result does not preclude SARS-CoV-2 infection  and should not be used as the sole basis for treatment or other  patient management decisions.  A negative result may occur with  improper specimen collection / handling, submission of specimen other  than nasopharyngeal swab, presence of viral mutation(s) within the  areas targeted by this assay, and inadequate number of viral copies  (<250 copies / mL). A negative result must be combined with clinical  observations, patient history, and epidemiological information. If result is POSITIVE SARS-CoV-2 target nucleic acids are DETECTED. The SARS-CoV-2 RNA is generally detectable in upper and lower  respiratory specimens dur ing the acute phase of infection.  Positive  results are indicative of active infection with SARS-CoV-2.  Clinical  correlation with patient history and other diagnostic information is  necessary to determine patient infection status.  Positive results do  not rule out bacterial infection or co-infection with other viruses. If result is PRESUMPTIVE POSTIVE SARS-CoV-2 nucleic acids MAY BE PRESENT.   A presumptive positive result was obtained on the submitted specimen  and confirmed on repeat testing.  While 2019 novel coronavirus  (SARS-CoV-2) nucleic acids may be present in the submitted sample  additional confirmatory testing may be necessary for epidemiological  and / or clinical management purposes  to differentiate between  SARS-CoV-2 and other Sarbecovirus currently known to infect humans.  If clinically indicated additional testing with an alternate test  methodology [phone]) is advised. The SARS-CoV-2 RNA is generally  detectable in upper and lower respiratory sp ecimens during the acute  phase of infection. The expected result is Negative. Fact  Sheet for Patients:  StrictlyIdeas.no Fact Sheet for Healthcare Providers: BankingDealers.co.za This test is not yet approved or cleared by the [place] FDA and has been authorized for detection and/or diagnosis of SARS-CoV-2 by FDA under an Emergency Use Authorization (EUA).  This EUA will remain in effect (meaning this test can be used) for the duration of the COVID-19 declaration under Section 564(b)(1) of the Act, 21 U.S.C. section 360bbb-3(b)(1), unless the authorization is terminated or revoked sooner. Performed at [facility], [place] [address] [place], [place] [address]   Blood Culture (routine x 2)     Status: None   Collection Time: 07/24/19  7:54 PM   Specimen: BLOOD  Result Value Ref Range Status   Specimen Description   Final    BLOOD LEFT ANTECUBITAL Performed at [facility] [address] [place], [place] [address]    Special Requests   Final    Blood Culture adequate volume BOTTLES DRAWN AEROBIC AND ANAEROBIC Performed at [facility] [address], [place], [place] [address]    Culture   Final    NO GROWTH 5 DAYS Performed at [facility], [place] [address], [place], [place] [address]    Report Status 07/29/2019 FINAL  Final  Urine culture     Status: None   Collection Time: 07/24/19  7:54 PM   Specimen: In/Out Cath Urine  Result Value Ref Range Status   Specimen Description   Final    IN/OUT CATH URINE Performed at [facility] [address] [place], [place] [address]    Special Requests   Final    NONE Performed at [facility], [place] [address] [place], [place] [address]    Culture   Final  NO GROWTH Performed at [facility], [place] [address], [place], [place] [address]    Report Status 07/26/2019 FINAL  Final  Blood Culture (routine x 2)     Status: None   Collection Time: 07/24/19  7:59  PM   Specimen: BLOOD  Result Value Ref Range Status   Specimen Description   Final    BLOOD BLOOD LEFT WRIST Performed at [facility] [address], [place], [place] [address]    Special Requests   Final    BOTTLES DRAWN AEROBIC AND ANAEROBIC Blood Culture results may not be optimal due to an inadequate volume of blood received in culture bottles Performed at [facility] [address], [place], [place] [address]    Culture   Final    NO GROWTH 5 DAYS Performed at [facility], [place] [address], [place],  [address]    Report Status 07/29/2019 FINAL  Final     Patient was seen and examined on the day of discharge and was found to be in stable condition. Time coordinating discharge: 25 minutes including assessment and coordination of care, as well as examination of the patient.   SIGNED:  Dessa Phi, DO [facility] [email or web address] 07/30/2019, 10:20 AM

## 2019-07-30 NOTE — Progress Notes (Signed)
SATURATION QUALIFICATIONS: (This note is used to comply with regulatory documentation for home oxygen)  Patient Saturations on Room Air at Rest = 95%  Patient Saturations on Room Air while Ambulating = 92%  Patient Saturations on 0 Liters of oxygen while Ambulating = 91-94%  Please briefly explain why patient needs home oxygen:

## 2019-07-30 NOTE — Progress Notes (Signed)
PULMONARY / CRITICAL CARE MEDICINE   NAME:  Tammy Cordova, MRN:  [id number], DOB:  Aug 19, 1966, LOS: 5 ADMISSION DATE:  07/24/2019, CONSULTATION DATE:  07/28/19 REFERRING MD:  [facility], CHIEF COMPLAINT:  CAP with small L effusion  BRIEF HISTORY:    79  yobf active smoker with doe x summer 2019 progressively worse then abruptly ill 07/15/19  Severe cough/ vomiting then pain belowe L breast on lying down > admitted 8.24/20 with dx of CAP / resp failure rx with Zmax/rocephin but no better by 8/27 with low grade fever and desat so CTa chest done with multilobar as dz mostly LUL and small L effusion and PCCM consulted am 8/28      STUDIES:   CTa 07/27/19  The study is negative for pulmonary emboli, though somewhat limited  Left upper lobe lobar pneumonia (with subtle air bronchograms) with reactive mediastinal lymphadenopathy and parapneumonic effusion.  Emphysema. Cardiomegaly. Enlargement of the main pulmonary artery, suggesting developing pulmonary hypertension.   CULTURES:  BC 8/24 x 2  neg UC 8/24 > NG  COVID 19 PCR  8/24 neg   ANTIBIOTICS:  Maxepime 8/24 only Zmax 8/24 Roc  8/25       SUBJECTIVE:  Dry cough, no cp or sob, sats ok walking on RA   CONSTITUTIONAL: BP (!) 144/92   Pulse 85   Temp 98.2 F (36.8 C) (Oral)   Resp 18   Ht 5\' 7"  (1.702 m)   Wt 72.6 kg   LMP  (LMP Unknown)   SpO2 95%   BMI 25.06 kg/m     PHYSICAL EXAM: Pt alert, approp nad @ sitting in chair, all smiles No jvd Oropharynx clear,  mucosa nl Neck supple Lungs with a few scattered exp > insp rhonchi bilaterally RRR no s3 or or sign murmur Abd obese with nl  excursion  Extr warm with no edema or clubbing noted Neuro  Sensorium nl ,  no apparent motor deficits     ASSESSMENT AND PLAN    1)  Multilobar CAP mostly in LUL already on approp rx with zmax/rocephin  - agree d/c to complete 10 days coverage for CAP as planned - f/u with me or Dr Laurann Montana a  A couple of weeks, she  has my card if needed   2) Underlying copd with some emphysematous features on CTa  8/27  - quit smoking on admit  - f/u with pfts rec   3) very small L Parapneumonic effusion on the L, no need to tap  4) 02 dep acute resp failure secondary to 1-3  - resolved as of walking sats 8/30    5) Mild normocytic anemia may have contributed to pre admit doe as well    Lab Results  Component Value Date   HGB 9.9 (L) 07/30/2019   HGB 8.9 (L) 07/29/2019   HGB 9.1 (L) 07/28/2019  - improving   6) MSCP vs GERD vs both from severe cough > rx ppi/mucinex dm   - resolved      LABS  Glucose No results for input(s): GLUCAP in the last 168 hours.  BMET Recent Labs  Lab 07/28/19 0451 07/29/19 0526 07/30/19 0537  NA 137 138 140  K 3.5 3.4* 3.9  CL 103 103 106  CO2 25 25 26   BUN 5* 5* 5*  CREATININE 0.41* 0.43* 0.46  GLUCOSE 107* 106* 114*    Liver Enzymes Recent Labs  Lab 07/24/19 1954  AST 30  ALT 21  ALKPHOS  88  BILITOT 1.0  ALBUMIN 2.9*    Electrolytes Recent Labs  Lab 07/27/19 0457 07/28/19 0451 07/29/19 0526 07/30/19 0537  CALCIUM 8.0* 8.1* 8.3* 8.4*  MG 1.8  --   --   --     CBC Recent Labs  Lab 07/28/19 0451 07/29/19 0526 07/30/19 0537  WBC 8.8 8.4 5.4  HGB 9.1* 8.9* 9.9*  HCT 29.1* 29.1* 32.1*  PLT 367 416* 403*    ABG No results for input(s): PHART, PCO2ART, PO2ART in the last 168 hours.  Coag's Recent Labs  Lab 07/24/19 1954  APTT 44*  INR 1.1    Sepsis Markers Recent Labs  Lab 07/24/19 2154  LATICACIDVEN 0.7     Agree with d/c planning as outlined   Christinia Gully, MD Pulmonary and [place] [phone] After 5:30 PM or weekends, use [facility]per [phone]

## 2019-07-30 NOTE — Progress Notes (Signed)
Patient discharged home, discharge instructions given and explained to patient, she verbalized understanding, denies any pain/distress. Skin intact, no wound noted. Accompanied home by family.

## 2019-07-30 NOTE — Discharge Instructions (Signed)

## 2019-08-10 ENCOUNTER — Other Ambulatory Visit: Payer: Self-pay

## 2019-08-10 ENCOUNTER — Other Ambulatory Visit: Payer: Self-pay | Admitting: Internal Medicine

## 2019-08-10 ENCOUNTER — Ambulatory Visit (INDEPENDENT_AMBULATORY_CARE_PROVIDER_SITE_OTHER): Payer: 59

## 2019-08-10 ENCOUNTER — Encounter: Payer: Self-pay | Admitting: Internal Medicine

## 2019-08-10 ENCOUNTER — Inpatient Hospital Stay: Payer: 59 | Admitting: Internal Medicine

## 2019-08-10 ENCOUNTER — Ambulatory Visit (INDEPENDENT_AMBULATORY_CARE_PROVIDER_SITE_OTHER): Payer: 59 | Admitting: Internal Medicine

## 2019-08-10 DIAGNOSIS — J189 Pneumonia, unspecified organism: Secondary | ICD-10-CM

## 2019-08-10 DIAGNOSIS — J181 Lobar pneumonia, unspecified organism: Secondary | ICD-10-CM | POA: Diagnosis not present

## 2019-08-10 NOTE — Progress Notes (Signed)
43  yobf quit smoking 07/24/2019 with doe x summer 2019 progressively worse then abruptly ill 07/15/19  Severe cough/ vomiting then pain below  L breast worse supine > admitted 07/24/2019 with dx of CAP / resp failure rx with Zmax/rocephin but no better by 8/27 with low grade fever and desat so CTa chest done with multilobar as dz mostly LUL and small L effusion and PCCM consulted am 8/28     Was Active smoker but excellent ex tol, no chronic symptoms until indolent onset doe to point of [facility] = can't walk a nl pace on a flat grade s sob but does fine slow and flat but did not have any w/u she can remember then abruptly ill as above first with really bad sense of HB with cough/vomit.      STUDIES:   CTa 07/27/19  The study is negative for pulmonary emboli, though somewhat limited  Left upper lobe lobar pneumonia (with subtle air bronchograms) with reactive mediastinal lymphadenopathy and parapneumonic effusion.  Emphysema. Cardiomegaly. Enlargement of the main pulmonary artery, suggesting developing pulmonary hypertension.   CULTURES:  BC 8/24 x 2  Neg  UC 8/24 > NG  COVID 19 PCR  8/24 neg   ANTIBIOTICS:  Maxepime 8/24 only Zmax 8/24> 9/3 Roc  8/25 > omnicef d/c 9/3      SUBJECTIVE:   since feeling much better Dyspnea:  Indoor walking fine/ no steps  - even housework tol ok  Cough: sporadic  Sleeping: lie down flat fine  SABA use: none 02: none  No medications  No chest pain at all   Pex  Pleasant amb bf nad  Wt Readings from Last 3 Encounters:  08/10/19 160 lb (72.6 kg)  07/24/19 160 lb (72.6 kg)  02/27/17 160 lb (72.6 kg)     Vital signs reviewed - Note on arrival 02 sats  99% on RA   HEENT: nl dentition, turbinates bilaterally, and oropharynx. Nl external ear canals without cough reflex   NECK :  without JVD/Nodes/TM/ nl carotid upstrokes bilaterally   LUNGS: no acc muscle use,  Nl contour chest which is clear to A and P bilaterally without cough on  insp or exp maneuvers   CV:  RRR  no s3 or murmur or increase in P2, and no edema   ABD:  soft and nontender with nl inspiratory excursion in the supine position. No bruits or organomegaly appreciated, bowel sounds nl  MS:  Nl gait/ ext warm without deformities, calf tenderness, cyanosis or clubbing No obvious joint restrictions   SKIN: warm and dry without lesions    NEURO:  alert, approp, nl sensorium with  no motor or cerebellar deficits apparent.     CXR PA and Lateral:   08/10/2019 :    I personally reviewed images and agree with radiology impression as follows:   Stable left upper lobe airspace opacity is noted most consistent with pneumonia as described on prior CT scan My impression :  Much improved, lingular rounded pna persists with prior prom air bronchogram on Ct             ASSESSMENT AND PLAN

## 2019-08-10 NOTE — Patient Instructions (Signed)
I will call you with the xray when available  Please schedule a follow up office visit in 6 weeks, call sooner if needed with PFTs on return

## 2019-08-10 NOTE — Assessment & Plan Note (Addendum)
See Admit 07/24/2019 with LUL Pna, very small pl effusion  - improved at f/u 08/10/2019 but still rounded pna lingula > rec f/u 09/21/2019 with cxr   She is clinically back to better than baseline (prob since quit smoking) with marked radiographic improvement and no late parapneumonic process so rec f/u as above  Discussed in detail all the  indications, usual  risks and alternatives  relative to the benefits with patient who agrees to proceed with conservative f/u as outlined     >>> return with pfts/ cxr

## 2019-09-05 ENCOUNTER — Other Ambulatory Visit: Payer: Self-pay | Admitting: Internal Medicine

## 2019-09-18 ENCOUNTER — Other Ambulatory Visit (HOSPITAL_COMMUNITY)
Admission: RE | Admit: 2019-09-18 | Discharge: 2019-09-18 | Disposition: A | Discharge status: Home / Self Care | Payer: 59 | Source: Ambulatory Visit | Attending: Internal Medicine | Admitting: Internal Medicine

## 2019-09-18 DIAGNOSIS — Z20828 Contact with and (suspected) exposure to other viral communicable diseases: Secondary | ICD-10-CM | POA: Diagnosis not present

## 2019-09-18 DIAGNOSIS — Z01812 Encounter for preprocedural laboratory examination: Secondary | ICD-10-CM | POA: Diagnosis present

## 2019-09-19 LAB — NOVEL CORONAVIRUS, NAA (HOSP ORDER, SEND-OUT TO REF LAB; TAT 18-24 HRS): SARS-CoV-2, NAA: NOT DETECTED

## 2019-09-19 NOTE — Progress Notes (Signed)
Spoke with pt and notified of results per Dr. Wert. Pt verbalized understanding and denied any questions. 

## 2019-09-20 ENCOUNTER — Other Ambulatory Visit: Payer: Self-pay | Admitting: Internal Medicine

## 2019-09-20 DIAGNOSIS — J432 Centrilobular emphysema: Secondary | ICD-10-CM

## 2019-09-21 ENCOUNTER — Other Ambulatory Visit: Payer: Self-pay

## 2019-09-21 ENCOUNTER — Ambulatory Visit: Payer: 59 | Admitting: Internal Medicine

## 2019-09-21 ENCOUNTER — Ambulatory Visit (INDEPENDENT_AMBULATORY_CARE_PROVIDER_SITE_OTHER): Payer: 59 | Admitting: Internal Medicine

## 2019-09-21 DIAGNOSIS — J432 Centrilobular emphysema: Secondary | ICD-10-CM

## 2019-09-21 LAB — PULMONARY FUNCTION TEST
DL/VA % pred: 67 %
DL/VA: 2.87 ml/min/mmHg/L
DLCO unc % pred: 49 %
DLCO unc: 10.74 ml/min/mmHg
FEF 25-75 Post: 0.76 L/sec
FEF 25-75 Pre: 0.5 L/sec
FEF2575-%Change-Post: 49 %
FEF2575-%Pred-Post: 30 %
FEF2575-%Pred-Pre: 20 %
FEV1-%Change-Post: 15 %
FEV1-%Pred-Post: 52 %
FEV1-%Pred-Pre: 45 %
FEV1-Post: 1.23 L
FEV1-Pre: 1.07 L
FEV1FVC-%Change-Post: 1 %
FEV1FVC-%Pred-Pre: 71 %
FEV6-%Change-Post: 13 %
FEV6-%Pred-Post: 71 %
FEV6-%Pred-Pre: 62 %
FEV6-Post: 2.06 L
FEV6-Pre: 1.82 L
FEV6FVC-%Change-Post: 0 %
FEV6FVC-%Pred-Post: 101 %
FEV6FVC-%Pred-Pre: 101 %
FVC-%Change-Post: 13 %
FVC-%Pred-Post: 70 %
FVC-%Pred-Pre: 62 %
FVC-Post: 2.08 L
FVC-Pre: 1.84 L
Post FEV1/FVC ratio: 59 %
Post FEV6/FVC ratio: 99 %
Pre FEV1/FVC ratio: 58 %
Pre FEV6/FVC Ratio: 99 %
RV % pred: 124 %
RV: 2.34 L
TLC % pred: 83 %
TLC: 4.35 L

## 2019-09-21 NOTE — Progress Notes (Signed)
PFT done today. 

## 2019-09-25 ENCOUNTER — Ambulatory Visit: Payer: 59 | Admitting: Internal Medicine

## 2019-09-26 ENCOUNTER — Encounter: Payer: Self-pay | Admitting: Internal Medicine

## 2019-09-26 ENCOUNTER — Ambulatory Visit (INDEPENDENT_AMBULATORY_CARE_PROVIDER_SITE_OTHER): Payer: 59 | Admitting: Internal Medicine

## 2019-09-26 ENCOUNTER — Ambulatory Visit (INDEPENDENT_AMBULATORY_CARE_PROVIDER_SITE_OTHER): Payer: 59

## 2019-09-26 ENCOUNTER — Other Ambulatory Visit: Payer: Self-pay

## 2019-09-26 DIAGNOSIS — F17210 Nicotine dependence, cigarettes, uncomplicated: Secondary | ICD-10-CM | POA: Diagnosis not present

## 2019-09-26 DIAGNOSIS — J449 Chronic obstructive pulmonary disease, unspecified: Secondary | ICD-10-CM | POA: Diagnosis not present

## 2019-09-26 DIAGNOSIS — J189 Pneumonia, unspecified organism: Secondary | ICD-10-CM

## 2019-09-26 MED ORDER — BUDESONIDE-FORMOTEROL FUMARATE 160-4.5 MCG/ACT IN AERO: 2.0000 | INHALATION_SPRAY | Freq: Two times a day (BID) | RESPIRATORY_TRACT | 0 refills | Status: DC

## 2019-09-26 MED ORDER — BUDESONIDE-FORMOTEROL FUMARATE 160-4.5 MCG/ACT IN AERO: INHALATION_SPRAY | RESPIRATORY_TRACT | 12 refills | Status: DC

## 2019-09-26 NOTE — Progress Notes (Signed)
17  yobf quit smoking 07/24/2019 with doe x summer 2019 progressively worse then abruptly ill 07/15/19  Severe cough/ vomiting then pain below  L breast worse supine > admitted 07/24/2019 with dx of CAP / resp failure rx with Zmax/rocephin but no better by 8/27 with low grade fever and desat so CTa chest done with multilobar as dz mostly LUL and small L effusion and PCCM consulted am 8/28     Was Active smoker but excellent ex tol, no chronic symptoms until indolent onset doe to point of [facility] = can't walk a nl pace on a flat grade s sob but does fine slow and flat but did not have any w/u she can remember then abruptly ill as above first with really bad sense of HB with cough/vomit.      STUDIES:   CTa 07/27/19  The study is negative for pulmonary emboli, though somewhat limited  Left upper lobe lobar pneumonia (with subtle air bronchograms) with reactive mediastinal lymphadenopathy and parapneumonic effusion.  Emphysema. Cardiomegaly. Enlargement of the main pulmonary artery, suggesting developing pulmonary hypertension.   CULTURES:  BC 8/24 x 2  Neg  UC 8/24 > NG  COVID 19 PCR  8/24 neg   ANTIBIOTICS:  Maxepime 8/24 only Zmax 8/24> 9/3 Roc  8/25 > omnicef d/c 9/3  Since discharge feeling much better Dyspnea:  Indoor walking fine/ no steps  - even housework tol ok  Cough: sporadic  Sleeping: lie down flat fine  SABA use: none 02: none  No medications  No chest pain at all  rec No change rx      09/26/2019  f/u ov/Melquiades Kovar re:  COPD GOLD II/ AB component,  Still smoking  Chief Complaint  Patient presents with  . Follow-up    Breathing is doing well. PFT done 09/21/19.   Dyspnea:  MMRC1 = can walk nl pace, flat grade, can't hurry or go uphills or steps s sob   Cough: none Sleeping: most nights 45 degrees on pillows ever since she's been an adult SABA use: none 02: none  Overt hb when eats the wrong food,esp late in evening, not compliant with ppi   No obvious day  to day or daytime variability or assoc excess/ purulent sputum or mucus plugs or hemoptysis or cp or chest tightness, subjective wheeze or overt sinus symptoms.   Sleeping  without nocturnal  or early am exacerbation  of respiratory  c/o's or need for noct saba. Also denies any obvious fluctuation of symptoms with weather or environmental changes or other aggravating or alleviating factors except as outlined above   No unusual exposure hx or h/o childhood pna/ asthma or knowledge of premature birth.  Current Allergies, Complete Past Medical History, Past Surgical History, Family History, and Social History were reviewed in [employer] record.  ROS  The following are not active complaints unless bolded Hoarseness, sore throat, dysphagia, dental problems, itching, sneezing,  nasal congestion or discharge of excess mucus or purulent secretions, ear ache,   fever, chills, sweats, unintended wt loss or wt gain, classically pleuritic or exertional cp,  orthopnea pnd or arm/hand swelling  or leg swelling, presyncope, palpitations, abdominal pain, anorexia, nausea, vomiting, diarrhea  or change in bowel habits or change in bladder habits, change in stools or change in urine, dysuria, hematuria,  rash, arthralgias, visual complaints, headache, numbness, weakness or ataxia or problems with walking or coordination,  change in mood or  memory.  Current Meds  Medication Sig  . acetaminophen (TYLENOL) 500 MG tablet Take 500 mg by mouth every 6 (six) hours as needed.  Marland Kitchen ibuprofen (ADVIL) 200 MG tablet Take 200 mg by mouth every 6 (six) hours as needed.        Pex :   09/26/2019     166   08/10/19 160 lb (72.6 kg)  07/24/19 160 lb (72.6 kg)  02/27/17 160 lb (72.6 kg)      pleasant amb bf nad  Vital signs reviewed - Note on arrival 02 sats  98% on RA    HEENT : pt wearing mask not removed for exam due to covid - 19 concerns.    NECK :  without JVD/Nodes/TM/ nl carotid  upstrokes bilaterally   LUNGS: no acc muscle use,  Mild barrel  contour chest wall with bilateral  Distant bs s audible wheeze and  without cough on insp or exp maneuvers  and mild  Hyperresonant  to  percussion bilaterally     CV:  RRR  no s3 or murmur or increase in P2, and no edema   ABD:  soft and nontender with pos end  insp Hoover's  in the supine position. No bruits or organomegaly appreciated, bowel sounds nl  MS:   Nl gait/  ext warm without deformities, calf tenderness, cyanosis or clubbing No obvious joint restrictions   SKIN: warm and dry without lesions    NEURO:  alert, approp, nl sensorium with  no motor or cerebellar deficits apparent.    .CXR PA and Lateral:   09/26/2019 :    I personally reviewed images and   impression as follows:   Marked serial improvement in Lingular density, still viz on pa but not latera                 ASSESSMENT AND PLAN

## 2019-09-26 NOTE — Patient Instructions (Addendum)
Symbicort 160 Take 2 puffs first thing in am and then another 2 puffs about 12 hours later.   Work on inhaler technique:  relax and gently blow all the way out then take a nice smooth deep breath back in, triggering the inhaler at same time you start breathing in.  Hold for up to 5 seconds if you can. Blow out thru nose. Rinse and gargle with water when done. Arm and hammer tootpaste    The key is to stop smoking completely before smoking completely stops you!  For smoking cessation info call [phone]  And if you want [place] to do a formal referral for hypnosis give me a call   Please remember to go to the  x-ray department  for your tests - we will call you with the results when they are available     Please schedule a follow up visit in 6  months but call sooner if needed  - bring your medication

## 2019-09-26 NOTE — Progress Notes (Signed)
Pt notified results and plan for 3 mos f/u. Pt already has a April 2021 appt. Offered to move up, patient would like to call back as she has to check her schedule. Nothing further needed at this time.

## 2019-09-26 NOTE — Assessment & Plan Note (Signed)
Counseled re importance of smoking cessation but did not meet time criteria for separate billing     I had an extended discussion with the patient reviewing all relevant studies completed to date and  lasting 15 to 20 minutes of a 25 minute visit    I performed detailed device teaching using a teach back method which extended face to face time for this visit (see above)  Each maintenance medication was reviewed in detail including emphasizing most importantly the difference between maintenance and prns and under what circumstances the prns are to be triggered using an action plan format that is not reflected in the computer generated alphabetically organized AVS which I have not found useful in most complex patients, especially with respiratory illnesses  Please see AVS for specific instructions unique to this visit that I personally wrote and verbalized to the the pt in detail and then reviewed with pt  by my nurse highlighting any  changes in therapy recommended at today's visit to their plan of care.     

## 2019-09-26 NOTE — Assessment & Plan Note (Signed)
Active smoker - PFT's  09/21/2019  FEV1 1.23 (52 % ) ratio 0.59  p 15 % improvement from saba p nothing prior to study with DLCO  10.74 (49%) corrects to 2.87 (67%)  for alv volume and FV curve classic concave f/v loop - 09/26/2019  After extensive coaching inhaler device,  effectiveness =    75% > try symb 160 2bid    She has enough symptoms and exacerbation risk/ reversibility to warrant trial of symb 160 2bid  And f/u in 6 m if doing well.  Prognosis depends on ability to quit smoking > choice of inhalers/ advised

## 2019-09-26 NOTE — Assessment & Plan Note (Signed)
See Admit 07/24/2019 with LUL Pna, very small pl effusion - improved at f/u 08/10/2019 but still rounded pna lingula >  F/u 09/26/2019 almost completely resolved.  No evidence of ca, ok to f/u at 6 m  Discussed in detail all the  indications, usual  risks and alternatives  relative to the benefits with patient who agrees to proceed with conservative f/u as outlined

## 2019-12-06 ENCOUNTER — Ambulatory Visit: Payer: 59 | Attending: Internal Medicine

## 2019-12-06 DIAGNOSIS — Z20822 Contact with and (suspected) exposure to covid-19: Secondary | ICD-10-CM

## 2019-12-07 LAB — NOVEL CORONAVIRUS, NAA: SARS-CoV-2, NAA: NOT DETECTED

## 2019-12-11 ENCOUNTER — Ambulatory Visit: Payer: 59 | Admitting: Internal Medicine

## 2019-12-14 ENCOUNTER — Encounter (HOSPITAL_COMMUNITY): Payer: Self-pay

## 2019-12-14 ENCOUNTER — Ambulatory Visit (HOSPITAL_COMMUNITY)
Admission: EM | Admit: 2019-12-14 | Discharge: 2019-12-14 | Disposition: A | Discharge status: Home / Self Care | Payer: 59 | Attending: Family Medicine | Admitting: Family Medicine

## 2019-12-14 ENCOUNTER — Other Ambulatory Visit: Payer: Self-pay

## 2019-12-14 DIAGNOSIS — U071 COVID-19: Secondary | ICD-10-CM | POA: Diagnosis not present

## 2019-12-14 LAB — POC SARS CORONAVIRUS 2 AG: SARS Coronavirus 2 Ag: POSITIVE — AB

## 2019-12-14 LAB — POC SARS CORONAVIRUS 2 AG -  ED: SARS Coronavirus 2 Ag: POSITIVE — AB

## 2019-12-14 NOTE — ED Provider Notes (Signed)
[facility]    CSN: [id number] Arrival date & time: 12/14/19  1232      History   Chief Complaint Chief Complaint  Patient presents with  . Chills  . Fever    HPI Tammy Cordova is a 54 y.o. female.   Patient is a 54 year old female past medical history of COPD, pneumonia, acute respiratory failure, sepsis.  She presents today with chills, body aches, fever, loss of taste and smell.  Symptoms started on Wednesday.  Reporting multiple people at her work have had Covid.  She has been taken Tylenol and ibuprofen for her body aches and fever.  Fever at home around 101.  No cough, chest pain or shortness of breath.  ROS per HPI    Fever   Past Medical History:  Diagnosis Date  . Pneumonia     Patient Active Problem List   Diagnosis Date Noted  . COPD  GOLD II, group C still smoking  07/28/2019  . Acute respiratory failure with hypoxemia ([place]) 07/28/2019  . CAP (community acquired pneumonia) 07/25/2019  . Sepsis ([place]) 07/25/2019  . Encounter for screening for HIV 07/25/2019  . Cigarette smoker 07/25/2019  . Headache 07/25/2019    Past Surgical History:  Procedure Laterality Date  . BREAST BIOPSY Left 04/2016   REACTIVE LYMPHOID HYPERPLASIA   . BUNIONECTOMY     bilateral    OB History   No obstetric history on file.      Home Medications    Prior to Admission medications   Medication Sig Start Date End Date Taking? Authorizing Provider  pantoprazole (PROTONIX) 40 MG tablet Take 1 tablet (40 mg total) by mouth 2 (two) times daily before a meal. Patient taking differently: Take 40 mg by mouth 2 (two) times daily as needed.  07/30/19 12/14/19 Yes Dessa Phi, DO  acetaminophen (TYLENOL) 500 MG tablet Take 500 mg by mouth every 6 (six) hours as needed.    [provider]  budesonide-formoterol (SYMBICORT) 160-4.5 MCG/ACT inhaler Take 2 puffs first thing in am and then another 2 puffs about 12 hours later. 09/26/19   Tanda Rockers, MD    ibuprofen (ADVIL) 200 MG tablet Take 200 mg by mouth every 6 (six) hours as needed.    [provider]    Family History Family History  Problem Relation Age of Onset  . Breast cancer Paternal Grandmother 57  . Diabetes Mother   . Hypertension Mother   . Diabetes Father   . Hypertension Father   . Cancer Father     Social History Social History   Tobacco Use  . Smoking status: Current Every Day Smoker    Packs/day: 0.30    Years: 36.00    Pack years: 10.80    Types: Cigarettes  . Smokeless tobacco: Never Used  Substance Use Topics  . Alcohol use: No  . Drug use: Yes    Types: Marijuana     Allergies   Patient has no known allergies.   Review of Systems Review of Systems  Constitutional: Positive for fever.     Physical Exam Triage Vital Signs ED Triage Vitals  Enc Vitals Group     BP 12/14/19 1248 (!) 141/88     Pulse Rate 12/14/19 1248 (!) 107     Resp 12/14/19 1248 18     Temp 12/14/19 1248 99.1 F (37.3 C)     Temp Source 12/14/19 1248 Oral     SpO2 12/14/19 1248 100 %  Weight --      Height --      Head Circumference --      Peak Flow --      Pain Score 12/14/19 1246 2     Pain Loc --      Pain Edu? --      Excl. in [place]? --    No data found.  Updated Vital Signs BP (!) 141/88 (BP Location: Right Arm)   Pulse (!) 107   Temp 99.1 F (37.3 C) (Oral)   Resp 18   SpO2 100%   Visual Acuity Right Eye Distance:   Left Eye Distance:   Bilateral Distance:    Right Eye Near:   Left Eye Near:    Bilateral Near:     Physical Exam Vitals and nursing note reviewed.  Constitutional:      General: She is not in acute distress.    Appearance: Normal appearance. She is not ill-appearing, toxic-appearing or diaphoretic.  HENT:     Head: Normocephalic and atraumatic.     Nose: Nose normal.  Eyes:     Conjunctiva/sclera: Conjunctivae normal.  Pulmonary:     Effort: Pulmonary effort is normal.  Musculoskeletal:        General:  Normal range of motion.     Cervical back: Normal range of motion.  Skin:    General: Skin is warm and dry.  Neurological:     Mental Status: She is alert.  Psychiatric:        Mood and Affect: Mood normal.      UC Treatments / Results  Labs (all labs ordered are listed, but only abnormal results are displayed) Labs Reviewed  POC SARS CORONAVIRUS 2 AG -  ED - Abnormal; Notable for the following components:      Result Value   SARS Coronavirus 2 Ag POSITIVE (*)    All other components within normal limits  POC SARS CORONAVIRUS 2 AG - Abnormal; Notable for the following components:   SARS Coronavirus 2 Ag POSITIVE (*)    All other components within normal limits    EKG   Radiology No results found.  Procedures Procedures (including critical care time)  Medications Ordered in UC Medications - No data to display  Initial Impression / Assessment and Plan / UC Course  I have reviewed the triage vital signs and the nursing notes.  Pertinent labs & imaging results that were available during my care of the patient were reviewed by me and considered in my medical decision making (see chart for details).     COVID-19-rapid test positive here in clinic. Patient made aware of results and quarantine precautions given. Work note given Recommend over-the-counter medications as needed for symptoms. Referred patient to the outpatient infusion clinic Final Clinical Impressions(s) / UC Diagnoses   Final diagnoses:  [id number]     Discharge Instructions     You have COVID  You need to go home and quarantine.  You can return to work 10 days from now as long as you are felling better and without fever You can take OTC medication as needed for symptoms  I am calling the infusion clinic to see if you qualify for outpatient infusion.  They will be in contact with you       ED Prescriptions    None     PDMP not reviewed this encounter.   Orvan July, NP 12/14/19  1336

## 2019-12-14 NOTE — ED Triage Notes (Signed)
Patient presents to Urgent Care with complaints of chills, hot flashes, loss of taste and smell, and loss of appetite since a few days ago. Patient reports no positive covid contacts.

## 2019-12-14 NOTE — Discharge Instructions (Addendum)
You have COVID  You need to go home and quarantine.  You can return to work 10 days from now as long as you are felling better and without fever You can take OTC medication as needed for symptoms  I am calling the infusion clinic to see if you qualify for outpatient infusion.  They will be in contact with you

## 2019-12-15 ENCOUNTER — Other Ambulatory Visit: Payer: Self-pay | Admitting: Nurse Practitioner

## 2019-12-18 ENCOUNTER — Ambulatory Visit: Payer: 59 | Admitting: Internal Medicine

## 2019-12-25 ENCOUNTER — Encounter (HOSPITAL_COMMUNITY): Payer: Self-pay

## 2019-12-25 ENCOUNTER — Telehealth (HOSPITAL_COMMUNITY): Payer: Self-pay

## 2019-12-25 NOTE — Telephone Encounter (Signed)
Spoke w/ pt on the phone regarding an appropriate day to pick up a note to return to work since testing positive for covid on 12/14/2019. Per MD, pt may come pick up a work note on 1/27 or 1/28 but no sooner so that symptoms may be monitored for the duration of the recommended isolation period. When this Pryor Curia spoke w/ the patient, the pt was originally told she could pick up the note today (1/25), but per MD, the note needs to be picked up by the pt no sooner than 1/27.

## 2020-01-16 ENCOUNTER — Ambulatory Visit (INDEPENDENT_AMBULATORY_CARE_PROVIDER_SITE_OTHER): Payer: 59 | Admitting: Internal Medicine

## 2020-01-16 ENCOUNTER — Encounter: Payer: Self-pay | Admitting: Internal Medicine

## 2020-01-16 ENCOUNTER — Other Ambulatory Visit: Payer: Self-pay

## 2020-01-16 DIAGNOSIS — F17210 Nicotine dependence, cigarettes, uncomplicated: Secondary | ICD-10-CM

## 2020-01-16 DIAGNOSIS — J449 Chronic obstructive pulmonary disease, unspecified: Secondary | ICD-10-CM

## 2020-01-16 MED ORDER — PANTOPRAZOLE SODIUM 40 MG PO TBEC: DELAYED_RELEASE_TABLET | ORAL | Status: DC

## 2020-01-16 MED ORDER — FAMOTIDINE 20 MG PO TABS: ORAL_TABLET | ORAL | 11 refills | Status: DC

## 2020-01-16 NOTE — Assessment & Plan Note (Signed)
Counseled re importance of smoking cessation but did not meet time criteria for separate billing            Each maintenance medication was reviewed in detail including emphasizing most importantly the difference between maintenance and prns and under what circumstances the prns are to be triggered using an action plan format where appropriate.  Total time for H and P, chart review, counseling re dx/ smoking impact, teaching device and generating customized AVS unique to this office visit / charting = 30 min

## 2020-01-16 NOTE — Patient Instructions (Addendum)
GERD (REFLUX)  is an extremely common cause of respiratory symptoms just like yours , many times with no obvious heartburn at all.    It can be treated with medication, but also with lifestyle changes including elevation of the head of your bed (ideally with 6 -8inch blocks under the headboard of your bed),  Smoking cessation, avoidance of late meals, excessive alcohol, and avoid fatty foods, chocolate, peppermint, colas, red wine, and acidic juices such as orange juice.  NO MINT OR MENTHOL PRODUCTS SO NO COUGH DROPS  USE SUGARLESS CANDY INSTEAD (Jolley ranchers or Stover's or Life Savers) or even ice chips will also do - the key is to swallow to prevent all throat clearing. NO OIL BASED VITAMINS - use powdered substitutes.  Avoid fish oil when coughing.  Pantoprazole (protonix) 40 mg   Take  30-60 min before first meal of the day and Pepcid (famotidine)  20 mg one after supper  until return to office - this is the best way to tell whether stomach acid is contributing to your problem.  Discuss this with your GI doctor (colon)  Avoid salt and follow up with your PCP   The key is to stop smoking completely before smoking completely stops you!    Please schedule a follow up visit in 6  months but call sooner if needed

## 2020-01-16 NOTE — Progress Notes (Signed)
38  yobf quit smoking 07/24/2019 with doe x summer 2019 progressively worse then abruptly ill 07/15/19  Severe cough/ vomiting then pain below  L breast worse supine > admitted 07/24/2019 with dx of CAP / resp failure rx with Zmax/rocephin but no better by 8/27 with low grade fever and desat so CTa chest done with multilobar as dz mostly LUL and small L effusion and PCCM consulted am 07/28/19      Was Active smoker but excellent ex tol, no chronic symptoms until indolent onset doe since summer 2019  to point of [facility] = can't walk a nl pace on a flat grade s sob but does fine slow and flat but did not have any w/u she can remember then abruptly ill as above first with really bad sense of HB with cough/vomit.      STUDIES:   CTa 07/27/19  The study is negative for pulmonary emboli, though somewhat limited  Left upper lobe lobar pneumonia (with subtle air bronchograms) with reactive mediastinal lymphadenopathy and parapneumonic effusion.  Emphysema. Cardiomegaly. Enlargement of the main pulmonary artery, suggesting developing pulmonary hypertension.   CULTURES:  BC 8/24 x 2  Neg  UC 8/24 > NG  COVID 19 PCR  8/24 neg   ANTIBIOTICS:  Maxepime 8/24 only Zmax 8/24> 9/3 Roc  8/25 > omnicef d/c 9/3  Since discharge feeling much better Dyspnea:  Indoor walking fine/ no steps  - even housework tol ok  Cough: sporadic  Sleeping: lie down flat fine  SABA use: none 02: none  No medications  No chest pain at all  rec No change rx      09/26/2019  f/u ov/Mirko Tailor re:  COPD GOLD II/ AB component,  Still smoking  Chief Complaint  Patient presents with  . Follow-up    Breathing is doing well. PFT done 09/21/19.   Dyspnea:  MMRC1 = can walk nl pace, flat grade, can't hurry or go uphills or steps s sob   Cough: none Sleeping: most nights 45 degrees on pillows ever since she's been an adult SABA use: none 02: none  Overt hb when eats the wrong food,esp late in evening, not compliant with  ppi rec Symbicort 160 Take 2 puffs first thing in am and then another 2 puffs about 12 hours later.  Work on inhaler technique:   The key is to stop smoking completely before smoking completely stops you! Please schedule a follow up visit in 6  months but call sooner if needed  - bring your medication    Onset Dec 13 2019 flu like,  Tested on Jan14th POS COVID 19 and back to baseline by the 24th of January 2021   01/16/2020  f/u ov/Kahlin Mark re:  COPD GOLD II/AB component still smoking  Chief Complaint  Patient presents with  . Follow-up    Breathing is doing well today. She had tested pos for covid 19 12/14/2019.   Dyspnea:  Still MMRC1 = can walk nl pace, flat grade, can't hurry or go uphills or steps s sob   Cough: none  Sleeping: 30 degrees ok  SABA use: none  02: none  Overt HB when misses dose of ppi, seeing GI doc for colonoscopy who doesn't want her to maintain on ppi    No obvious day to day or daytime variability or assoc excess/ purulent sputum or mucus plugs or hemoptysis or cp or chest tightness, subjective wheeze or overt sinus  symptoms.   Sleeping  without nocturnal  or  early am exacerbation  of respiratory  c/o's or need for noct saba. Also denies any obvious fluctuation of symptoms with weather or environmental changes or other aggravating or alleviating factors except as outlined above   No unusual exposure hx or h/o childhood pna/ asthma or knowledge of premature birth.  Current Allergies, Complete Past Medical History, Past Surgical History, Family History, and Social History were reviewed in [employer] record.  ROS  The following are not active complaints unless bolded Hoarseness, sore throat, dysphagia, dental problems, itching, sneezing,  nasal congestion or discharge of excess mucus or purulent secretions, ear ache,   fever, chills, sweats, unintended wt loss or wt gain, classically pleuritic or exertional cp,  orthopnea pnd or arm/hand  swelling  or leg swelling, presyncope, palpitations, abdominal pain, anorexia, nausea, vomiting, diarrhea  or change in bowel habits or change in bladder habits, change in stools or change in urine, dysuria, hematuria,  rash, arthralgias, visual complaints, headache, numbness, weakness or ataxia or problems with walking or coordination,  change in mood or  memory.        Current Meds  Medication Sig  . acetaminophen (TYLENOL) 500 MG tablet Take 500 mg by mouth every 6 (six) hours as needed.  . budesonide-formoterol (SYMBICORT) 160-4.5 MCG/ACT inhaler Take 2 puffs first thing in am and then another 2 puffs about 12 hours later.  Marland Kitchen ibuprofen (ADVIL) 200 MG tablet Take 200 mg by mouth every 6 (six) hours as needed.  . pantoprazole (PROTONIX) 40 MG tablet Take 1 tablet (40 mg total) by mouth 2 (two) times daily before a meal. (Patient taking differently: Take 40 mg by mouth 2 (two) times daily as needed. )                  Pex :   01/16/2020        163   09/26/2019     166   08/10/19 160 lb (72.6 kg)  07/24/19 160 lb (72.6 kg)  02/27/17 160 lb (72.6 kg)    amb bf nad   Vital signs reviewed  01/16/2020  - Note at rest 02 sats  96% on RA     HEENT : pt wearing mask not removed for exam due to covid - 19 concerns.   NECK :  without JVD/Nodes/TM/ nl carotid upstrokes bilaterally   LUNGS: no acc muscle use,  Min barrel  contour chest wall with bilateral  slightly decreased bs s audible wheeze and  without cough on insp or exp maneuvers and min  Hyperresonant  to  percussion bilaterally     CV:  RRR  no s3 or murmur or increase in P2, and no edema   ABD:  soft and nontender with pos end  insp Hoover's  in the supine position. No bruits or organomegaly appreciated, bowel sounds nl  MS:   Nl gait/  ext warm without deformities, calf tenderness, cyanosis or clubbing No obvious joint restrictions   SKIN: warm and dry without lesions    NEURO:  alert, approp, nl sensorium with  no motor  or cerebellar deficits apparent.           ASSESSMENT AND PLAN

## 2020-01-16 NOTE — Assessment & Plan Note (Signed)
Active smoker - PFT's  09/21/2019  FEV1 1.23 (52 % ) ratio 0.59  p 15 % improvement from saba p nothing prior to study with DLCO  10.74 (49%) corrects to 2.87 (67%)  for alv volume and FV curve classic concave f/v loop - 09/26/2019  After extensive coaching inhaler device,  effectiveness =    75% > try symb 160 2bid    Despite smoking and apparent covid 19  doing relatively well on symb 160 2bid so no change in rx needed.

## 2020-01-17 DIAGNOSIS — M66241 Spontaneous rupture of extensor tendons, right hand: Secondary | ICD-10-CM | POA: Insufficient documentation

## 2020-01-17 DIAGNOSIS — M20031 Swan-neck deformity of right finger(s): Secondary | ICD-10-CM | POA: Insufficient documentation

## 2020-02-07 ENCOUNTER — Other Ambulatory Visit: Payer: Self-pay | Admitting: Family Medicine

## 2020-02-07 DIAGNOSIS — Z1231 Encounter for screening mammogram for malignant neoplasm of breast: Secondary | ICD-10-CM

## 2020-02-19 ENCOUNTER — Other Ambulatory Visit: Payer: Self-pay

## 2020-02-19 ENCOUNTER — Ambulatory Visit
Admission: RE | Admit: 2020-02-19 | Discharge: 2020-02-19 | Disposition: A | Discharge status: Home / Self Care | Payer: 59 | Source: Ambulatory Visit

## 2020-02-19 DIAGNOSIS — Z1231 Encounter for screening mammogram for malignant neoplasm of breast: Secondary | ICD-10-CM

## 2020-03-20 ENCOUNTER — Other Ambulatory Visit: Payer: Self-pay | Admitting: Family Medicine

## 2020-03-20 ENCOUNTER — Ambulatory Visit
Admission: RE | Admit: 2020-03-20 | Discharge: 2020-03-20 | Disposition: A | Discharge status: Home / Self Care | Payer: 59 | Source: Ambulatory Visit | Attending: Family Medicine | Admitting: Family Medicine

## 2020-03-20 DIAGNOSIS — M79651 Pain in right thigh: Secondary | ICD-10-CM

## 2020-03-20 DIAGNOSIS — M79652 Pain in left thigh: Secondary | ICD-10-CM

## 2020-03-22 ENCOUNTER — Other Ambulatory Visit: Payer: Self-pay | Admitting: Family Medicine

## 2020-03-22 DIAGNOSIS — M79604 Pain in right leg: Secondary | ICD-10-CM

## 2020-03-26 ENCOUNTER — Other Ambulatory Visit: Payer: Self-pay

## 2020-03-26 ENCOUNTER — Ambulatory Visit (INDEPENDENT_AMBULATORY_CARE_PROVIDER_SITE_OTHER): Payer: 59

## 2020-03-26 ENCOUNTER — Ambulatory Visit (INDEPENDENT_AMBULATORY_CARE_PROVIDER_SITE_OTHER): Payer: 59 | Admitting: Internal Medicine

## 2020-03-26 ENCOUNTER — Encounter: Payer: Self-pay | Admitting: Internal Medicine

## 2020-03-26 DIAGNOSIS — J449 Chronic obstructive pulmonary disease, unspecified: Secondary | ICD-10-CM

## 2020-03-26 DIAGNOSIS — F17210 Nicotine dependence, cigarettes, uncomplicated: Secondary | ICD-10-CM | POA: Diagnosis not present

## 2020-03-26 NOTE — Patient Instructions (Addendum)
The key is to stop smoking completely before smoking completely stops you!  Please remember to go to the  x-ray department  for your tests - we will call you with the results when they are available     Please schedule a follow up visit in 6 months but call sooner if needed with PFTs on return

## 2020-03-26 NOTE — Assessment & Plan Note (Signed)
Active smoker - PFT's  09/21/2019  FEV1 1.23 (52 % ) ratio 0.59  p 15 % improvement from saba p nothing prior to study with DLCO  10.74 (49%) corrects to 2.87 (67%)  for alv volume and FV curve classic concave f/v loop - 09/26/2019  After extensive coaching inhaler device,  effectiveness =    75% > try symb 160 2bid    Despite smoking, AB component adequate rx on symb 160 2bid with no obvious progression but now likely RA so needs yearly pfts looking for RA lung dz and adverse effects of RA meds on lung function.   >>>  F/u in 6 m

## 2020-03-26 NOTE — Progress Notes (Addendum)
61  yobf quit smoking 07/24/2019 with doe x summer 2019 progressively worse then abruptly ill 07/15/19  Severe cough/ vomiting then pain below  L breast worse supine > admitted 07/24/2019 with dx of CAP / resp failure rx with Zmax/rocephin but no better by 8/27 with low grade fever and desat so CTa chest done with multilobar as dz mostly LUL and small L effusion and PCCM consulted am 07/28/19      Was Active smoker but excellent ex tol, no chronic symptoms until indolent onset doe since summer 2019  to point of [facility] = can't walk a nl pace on a flat grade s sob but does fine slow and flat but did not have any w/u she can remember then abruptly ill as above first with really bad sense of HB with cough/vomit.      STUDIES:   CTa 07/27/19  The study is negative for pulmonary emboli, though somewhat limited  Left upper lobe lobar pneumonia (with subtle air bronchograms) with reactive mediastinal lymphadenopathy and parapneumonic effusion.  Emphysema. Cardiomegaly. Enlargement of the main pulmonary artery, suggesting developing pulmonary hypertension.   CULTURES:  BC 8/24 x 2  Neg  UC 8/24 > NG  COVID 19 PCR  8/24 neg   ANTIBIOTICS:  Maxepime 8/24 only Zmax 8/24> 9/3 Roc  8/25 > omnicef d/c 9/3  Since discharge feeling much better Dyspnea:  Indoor walking fine/ no steps  - even housework tol ok  Cough: sporadic  Sleeping: lie down flat fine  SABA use: none 02: none  No medications  No chest pain at all  rec No change rx      09/26/2019  f/u ov/Margo Lama re:  COPD GOLD II/ AB component,  Still smoking  Chief Complaint  Patient presents with  . Follow-up    Breathing is doing well. PFT done 09/21/19.   Dyspnea:  MMRC1 = can walk nl pace, flat grade, can't hurry or go uphills or steps s sob   Cough: none Sleeping: most nights 45 degrees on pillows ever since she's been an adult SABA use: none 02: none  Overt hb when eats the wrong food,esp late in evening, not compliant with  ppi rec Symbicort 160 Take 2 puffs first thing in am and then another 2 puffs about 12 hours later.  Work on inhaler technique:   The key is to stop smoking completely before smoking completely stops you! Please schedule a follow up visit in 6  months but call sooner if needed  - bring your medication    Onset Dec 13 2019 flu like,  Tested on Jan14th POS COVID 19 and back to baseline by the 24th of January 2021   01/16/2020  f/u ov/Zymiere Trostle re:  COPD GOLD II/AB component still smoking  Chief Complaint  Patient presents with  . Follow-up    Breathing is doing well today. She had tested pos for covid 19 12/14/2019.   Dyspnea:  Still MMRC1 = can walk nl pace, flat grade, can't hurry or go uphills or steps s sob   Cough: none  Sleeping: 30 degrees ok  SABA use: none  02: none  Overt HB when misses dose of ppi, seeing GI doc for colonoscopy who doesn't want her to maintain on ppi  rec GERD diet   Pantoprazole (protonix) 40 mg   Take  30-60 min before first meal of the day and Pepcid (famotidine)  20 mg one after supper  until return to office - this is the best  way to tell whether stomach acid is contributing to your problem.  Discuss this with your GI doctor (colon) Avoid salt and follow up with your PCP  The key is to stop smoking completely before smoking completely stops you!   03/26/2020  f/u ov/Wilson Dusenbery re: J&J in March 2021 for covid / gold II copd still smoking  Chief Complaint  Patient presents with  . Follow-up    Breathing is doing well and she denies any new co's. She does not have a rescue inhaler.   Dyspnea: as long as no rush / no steps ok = MMRC1 = can walk nl pace, flat grade, can't hurry or go uphills or steps s sob   Cough: none  Sleeping: bed is flat pillows x 1  SABA use: none  02: none    No obvious day to day or daytime variability or assoc excess/ purulent sputum or mucus plugs or hemoptysis or cp or chest tightness, subjective wheeze or overt sinus or hb symptoms.    Sleeping fine  without nocturnal  or early am exacerbation  of respiratory  c/o's or need for noct saba. Also denies any obvious fluctuation of symptoms with weather or environmental changes or other aggravating or alleviating factors except as outlined above   No unusual exposure hx or h/o childhood pna/ asthma or knowledge of premature birth.  Current Allergies, Complete Past Medical History, Past Surgical History, Family History, and Social History were reviewed in [employer] record.  ROS  The following are not active complaints unless bolded Hoarseness, sore throat, dysphagia, dental problems, itching, sneezing,  nasal congestion or discharge of excess mucus or purulent secretions, ear ache,   fever, chills, sweats, unintended wt loss or wt gain, classically pleuritic or exertional cp,  orthopnea pnd or arm/hand swelling  or leg swelling, presyncope, palpitations, abdominal pain, anorexia, nausea, vomiting, diarrhea  or change in bowel habits or change in bladder habits, change in stools or change in urine, dysuria, hematuria,  rash, arthralgias, visual complaints, headache, numbness, weakness or ataxia or problems with walking or coordination,  change in mood or  memory.        Current Meds  Medication Sig  . acetaminophen (TYLENOL) 500 MG tablet Take 500 mg by mouth every 6 (six) hours as needed.  . budesonide-formoterol (SYMBICORT) 160-4.5 MCG/ACT inhaler Take 2 puffs first thing in am and then another 2 puffs about 12 hours later.  . famotidine (PEPCID) 20 MG tablet One after suipper  . ibuprofen (ADVIL) 200 MG tablet Take 200 mg by mouth every 6 (six) hours as needed.  . meloxicam (MOBIC) 7.5 MG tablet Take 7.5 mg by mouth daily.  . methylPREDNISolone (MEDROL DOSEPAK) 4 MG TBPK tablet See admin instructions. follow package directions  . pantoprazole (PROTONIX) 40 MG tablet Take 30-60 min before first meal of the day  . valsartan (DIOVAN) 80 MG tablet Take 80 mg  by mouth daily.                    Pex :  03/26/2020        163  01/16/2020        163   09/26/2019     166   08/10/19 160 lb (72.6 kg)  07/24/19 160 lb (72.6 kg)  02/27/17 160 lb (72.6 kg)     amb bf nad  Vital signs reviewed  03/26/2020  - Note at rest 02 sats  97% on RA    HEENT :  pt wearing mask not removed for exam due to covid - 19 concerns.   NECK :  without JVD/Nodes/TM/ nl carotid upstrokes bilaterally   LUNGS: no acc muscle use,  Min barrel  contour chest wall with bilateral  slightly decreased bs s audible wheeze and  without cough on insp or exp maneuvers and min  Hyperresonant  to  percussion bilaterally     CV:  RRR  no s3 or murmur or increase in P2, and no edema   ABD:  soft and nontender with pos end  insp Hoover's  in the supine position. No bruits or organomegaly appreciated, bowel sounds nl  MS:   Nl gait/  ext warm without deformities, calf tenderness, cyanosis or clubbing No obvious joint restrictions   SKIN: warm and dry without lesions    NEURO:  alert, approp, nl sensorium with  no motor or cerebellar deficits apparent.               CXR PA and Lateral:   03/26/2020 :    I personally reviewed images and   impression as follows:   1. No acute cardiopulmonary abnormalities. 2. Chronic right pleural effusion versus pleuroparenchymal scarring My impression: mild T kyphosis, minimal increased markings /scarring s significant effusion          ASSESSMENT AND PLAN

## 2020-03-26 NOTE — Assessment & Plan Note (Signed)
Low-dose CT lung cancer screening is recommended for patients who are 83-63  years of age with a 20+ pack-year history of smoking, and who are currently smoking or quit <=15 years ago.   She is eligible  for LDSCT but if has RA the specificity will drop with lots of false positives so for now best way to prevent lung ca is stop smoking/ consider LDSCT as an option if accepts the risk of false positives leading to "unnecessary" procedures in this setting   Counseled re importance of smoking cessation but did not meet time criteria for separate billing            Each maintenance medication was reviewed in detail including emphasizing most importantly the difference between maintenance and prns and under what circumstances the prns are to be triggered using an action plan format where appropriate.  Total time for H and P, chart review, counseling,  and generating customized AVS unique to this office visit / charting = 30 min

## 2020-04-01 NOTE — Progress Notes (Signed)
Spoke with pt and notified of results per Dr. Wert. Pt verbalized understanding and denied any questions. 

## 2020-04-18 ENCOUNTER — Other Ambulatory Visit: Payer: 59

## 2020-05-27 ENCOUNTER — Ambulatory Visit
Admission: RE | Admit: 2020-05-27 | Discharge: 2020-05-27 | Disposition: A | Discharge status: Home / Self Care | Payer: 59 | Source: Ambulatory Visit | Attending: Family Medicine | Admitting: Family Medicine

## 2020-05-27 ENCOUNTER — Other Ambulatory Visit: Payer: Self-pay

## 2020-05-27 DIAGNOSIS — M79605 Pain in left leg: Secondary | ICD-10-CM

## 2020-06-06 IMAGING — DX DG CHEST 2V
2 series · 2 of 2 positions shown · non-contrast
Comparison: Radiograph July 24, 2019. CT scan July 27, 2019.

CLINICAL DATA: Pneumonia.

EXAM:
CHEST - 2 VIEW

[chest pa]
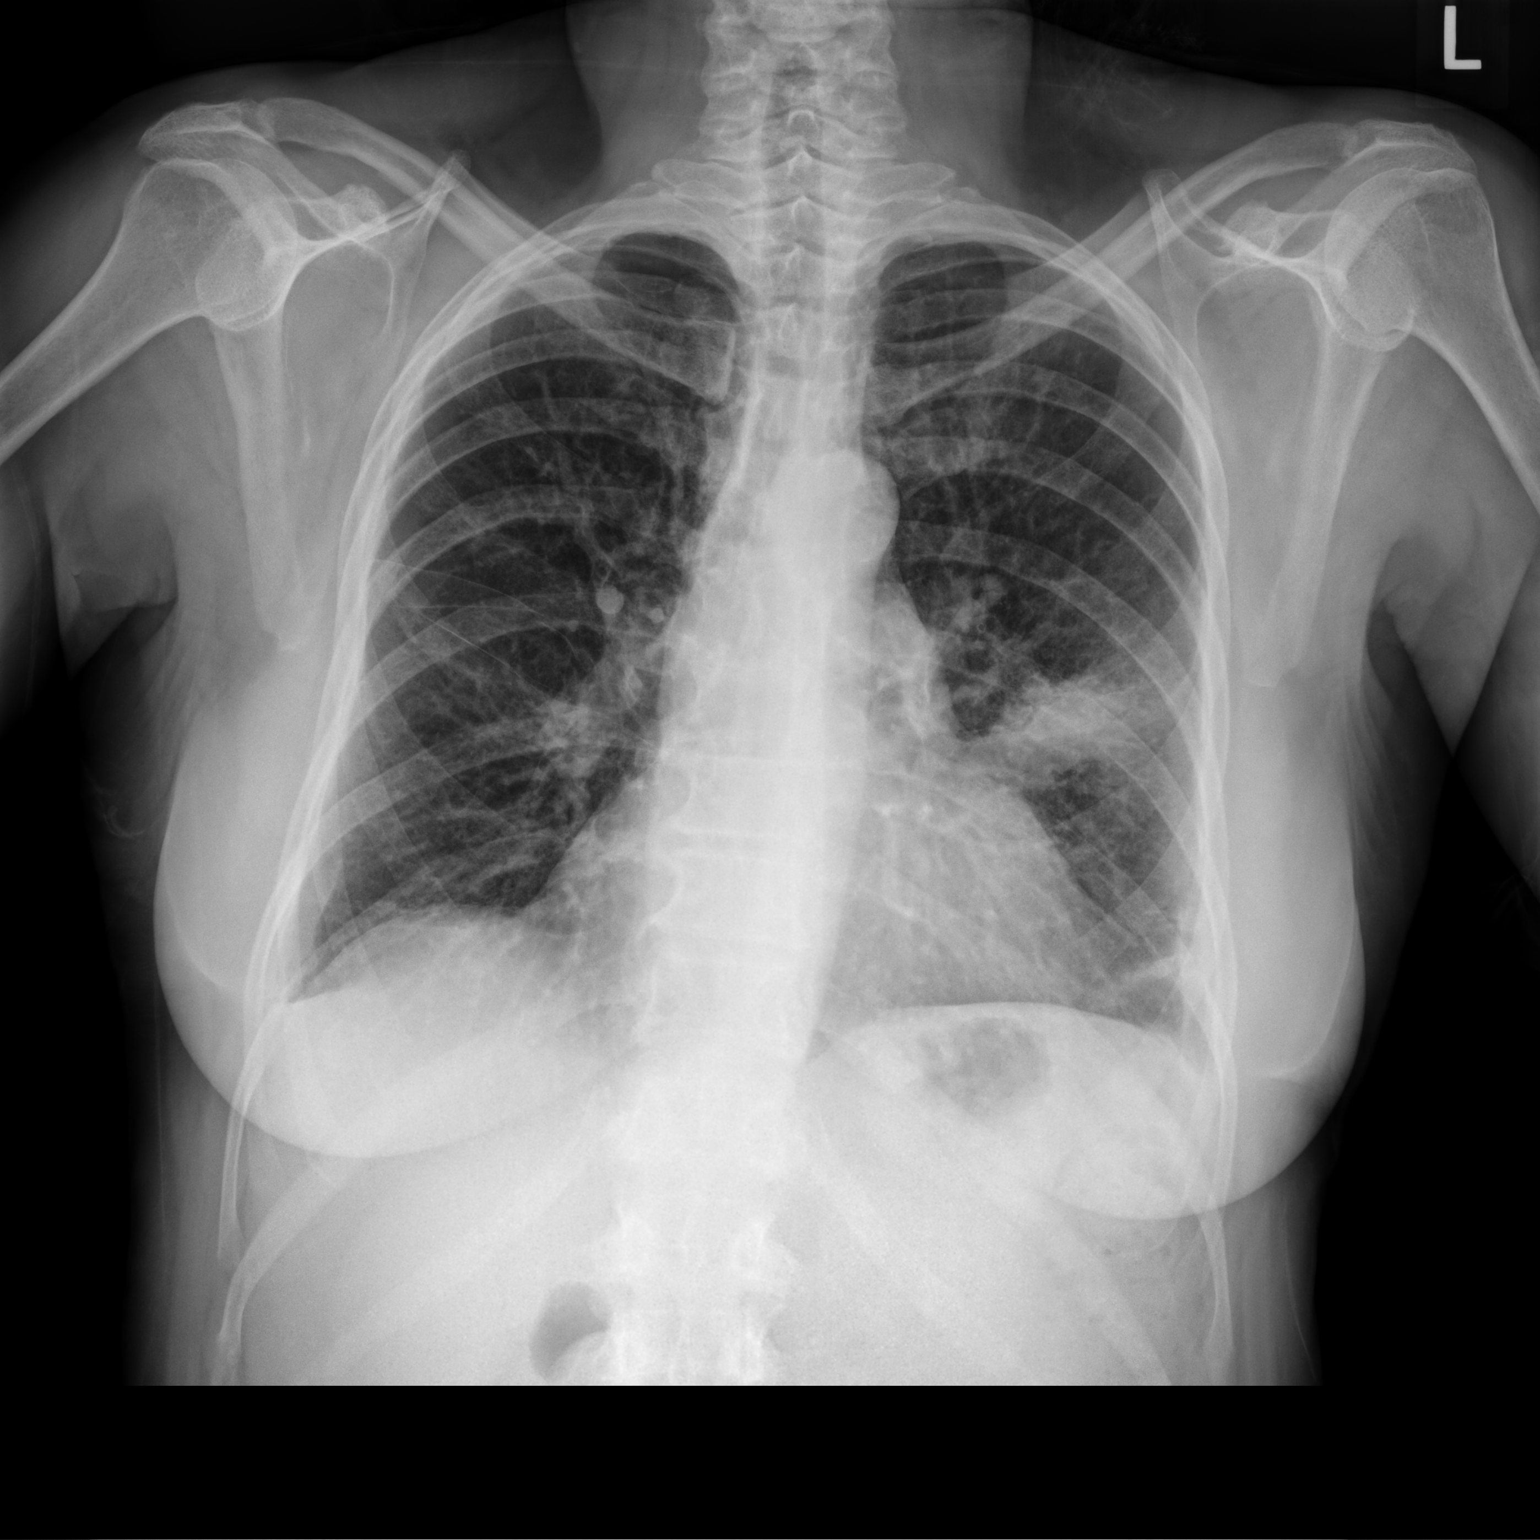

[chest lat]
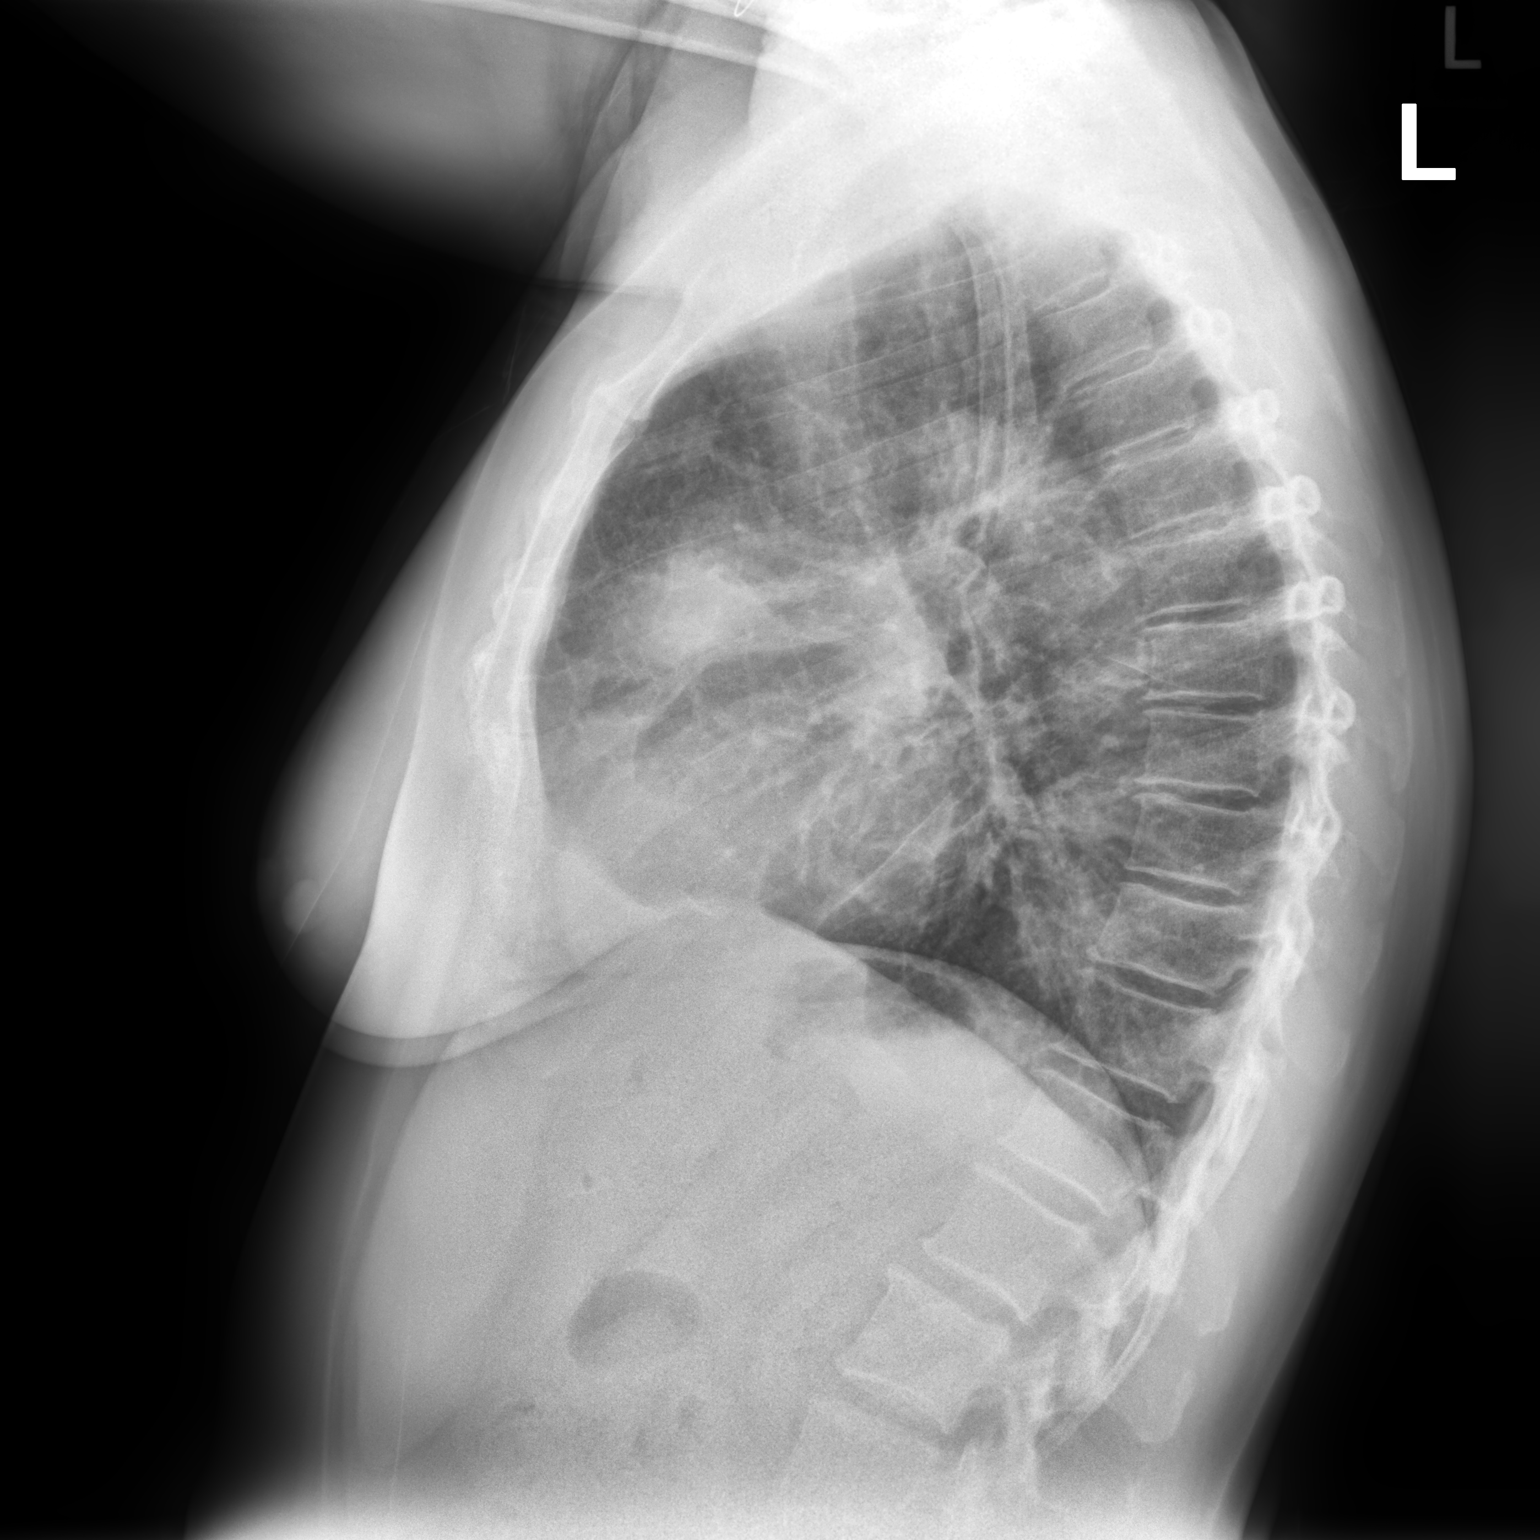

[2 of 2 positions shown; findings below may reference images not displayed]

FINDINGS: The heart size and mediastinal contours are within normal limits. No
pneumothorax or pleural effusion is noted. Stable left upper lobe
airspace opacity is noted. The visualized skeletal structures are
unremarkable.
IMPRESSION: Stable left upper lobe airspace opacity is noted most consistent
with pneumonia as described on prior CT scan. Continued radiographic
follow up until resolution is recommended to rule out underlying
mass or neoplasm.

## 2020-07-22 ENCOUNTER — Ambulatory Visit (HOSPITAL_COMMUNITY)
Admission: EM | Admit: 2020-07-22 | Discharge: 2020-07-22 | Disposition: A | Discharge status: Home / Self Care | Payer: 59 | Attending: Urgent Care | Admitting: Urgent Care

## 2020-07-22 ENCOUNTER — Other Ambulatory Visit: Payer: Self-pay

## 2020-07-22 DIAGNOSIS — Z20822 Contact with and (suspected) exposure to covid-19: Secondary | ICD-10-CM | POA: Diagnosis present

## 2020-07-22 LAB — SARS CORONAVIRUS 2 (TAT 6-24 HRS): SARS Coronavirus 2: NEGATIVE

## 2020-07-22 NOTE — ED Triage Notes (Signed)
Nurse visit. Pt had COVID exposure, wants COVID test. Pt denies sx at this time.

## 2020-09-23 ENCOUNTER — Other Ambulatory Visit (HOSPITAL_COMMUNITY): Payer: 59

## 2020-09-23 ENCOUNTER — Other Ambulatory Visit (HOSPITAL_COMMUNITY)
Admission: RE | Admit: 2020-09-23 | Discharge: 2020-09-23 | Disposition: A | Discharge status: Home / Self Care | Payer: 59 | Source: Ambulatory Visit | Attending: Internal Medicine | Admitting: Internal Medicine

## 2020-09-23 DIAGNOSIS — Z20822 Contact with and (suspected) exposure to covid-19: Secondary | ICD-10-CM | POA: Insufficient documentation

## 2020-09-23 DIAGNOSIS — Z01812 Encounter for preprocedural laboratory examination: Secondary | ICD-10-CM | POA: Diagnosis present

## 2020-09-23 LAB — SARS CORONAVIRUS 2 (TAT 6-24 HRS): SARS Coronavirus 2: NEGATIVE

## 2020-09-26 ENCOUNTER — Other Ambulatory Visit: Payer: Self-pay

## 2020-09-26 ENCOUNTER — Ambulatory Visit (INDEPENDENT_AMBULATORY_CARE_PROVIDER_SITE_OTHER): Payer: 59 | Admitting: Internal Medicine

## 2020-09-26 ENCOUNTER — Encounter: Payer: Self-pay | Admitting: Internal Medicine

## 2020-09-26 DIAGNOSIS — J449 Chronic obstructive pulmonary disease, unspecified: Secondary | ICD-10-CM

## 2020-09-26 DIAGNOSIS — F17210 Nicotine dependence, cigarettes, uncomplicated: Secondary | ICD-10-CM | POA: Diagnosis not present

## 2020-09-26 LAB — PULMONARY FUNCTION TEST
DL/VA % pred: 72 %
DL/VA: 3.08 ml/min/mmHg/L
DLCO cor % pred: 54 %
DLCO cor: 11.71 ml/min/mmHg
DLCO unc % pred: 54 %
DLCO unc: 11.71 ml/min/mmHg
FEF 25-75 Post: 0.73 L/sec
FEF 25-75 Pre: 0.46 L/sec
FEF2575-%Change-Post: 58 %
FEF2575-%Pred-Post: 30 %
FEF2575-%Pred-Pre: 19 %
FEV1-%Change-Post: 17 %
FEV1-%Pred-Post: 55 %
FEV1-%Pred-Pre: 47 %
FEV1-Post: 1.31 L
FEV1-Pre: 1.11 L
FEV1FVC-%Change-Post: 4 %
FEV1FVC-%Pred-Pre: 67 %
FEV6-%Change-Post: 12 %
FEV6-%Pred-Post: 78 %
FEV6-%Pred-Pre: 69 %
FEV6-Post: 2.25 L
FEV6-Pre: 1.99 L
FEV6FVC-%Change-Post: 0 %
FEV6FVC-%Pred-Post: 99 %
FEV6FVC-%Pred-Pre: 99 %
FVC-%Change-Post: 12 %
FVC-%Pred-Post: 78 %
FVC-%Pred-Pre: 69 %
FVC-Post: 2.32 L
FVC-Pre: 2.05 L
Post FEV1/FVC ratio: 57 %
Post FEV6/FVC ratio: 97 %
Pre FEV1/FVC ratio: 54 %
Pre FEV6/FVC Ratio: 97 %
RV % pred: 107 %
RV: 2.04 L
TLC % pred: 81 %
TLC: 4.23 L

## 2020-09-26 MED ORDER — ALBUTEROL SULFATE HFA 108 (90 BASE) MCG/ACT IN AERS: INHALATION_SPRAY | RESPIRATORY_TRACT | 1 refills | Status: DC

## 2020-09-26 MED ORDER — BUDESONIDE-FORMOTEROL FUMARATE 160-4.5 MCG/ACT IN AERO: INHALATION_SPRAY | RESPIRATORY_TRACT | 11 refills | Status: DC

## 2020-09-26 NOTE — Progress Notes (Signed)
PFT done today. 

## 2020-09-26 NOTE — Progress Notes (Signed)
13  yobf quit smoking 07/24/2019 with doe x summer 2019 progressively worse then abruptly ill 07/15/19  Severe cough/ vomiting then pain below  L breast worse supine > admitted 07/24/2019 with dx of CAP / resp failure rx with Zmax/rocephin but no better by 8/27 with low grade fever and desat so CTa chest done with multilobar as dz mostly LUL and small L effusion and PCCM consulted am 07/28/19      Was Active smoker but excellent ex tol, no chronic symptoms until indolent onset doe since summer 2019  to point of [facility] = can't walk a nl pace on a flat grade s sob but does fine slow and flat but did not have any w/u she can remember then abruptly ill as above first with really bad sense of HB with cough/vomit.      STUDIES:   CTa 07/27/19  The study is negative for pulmonary emboli, though somewhat limited  Left upper lobe lobar pneumonia (with subtle air bronchograms) with reactive mediastinal lymphadenopathy and parapneumonic effusion.  Emphysema. Cardiomegaly. Enlargement of the main pulmonary artery, suggesting developing pulmonary hypertension.   CULTURES:  BC 8/24 x 2  Neg  UC 8/24 > NG  COVID 19 PCR  8/24 neg   ANTIBIOTICS:  Maxepime 8/24 only Zmax 8/24> 9/3 Roc  8/25 > omnicef d/c 9/3  Since discharge feeling much better Dyspnea:  Indoor walking fine/ no steps  - even housework tol ok  Cough: sporadic  Sleeping: lie down flat fine  SABA use: none 02: none  No medications  No chest pain at all  rec No change rx      09/26/2019  f/u ov/Deneane Stifter re:  COPD GOLD II/ AB component,  Still smoking  Chief Complaint  Patient presents with   Follow-up    Breathing is doing well. PFT done 09/21/19.   Dyspnea:  MMRC1 = can walk nl pace, flat grade, can't hurry or go uphills or steps s sob   Cough: none Sleeping: most nights 45 degrees on pillows ever since she's been an adult SABA use: none 02: none  Overt hb when eats the wrong food,esp late in evening, not compliant with  ppi rec Symbicort 160 Take 2 puffs first thing in am and then another 2 puffs about 12 hours later.  Work on inhaler technique:   The key is to stop smoking completely before smoking completely stops you! Please schedule a follow up visit in 6  months but call sooner if needed  - bring your medication    Onset Dec 13 2019 flu like,  Tested on Jan14th POS COVID 19 and back to baseline by the 24th of January 2021   01/16/2020  f/u ov/Adriannah Steinkamp re:  COPD GOLD II/AB component still smoking  Chief Complaint  Patient presents with   Follow-up    Breathing is doing well today. She had tested pos for covid 19 12/14/2019.   Dyspnea:  Still MMRC1 = can walk nl pace, flat grade, can't hurry or go uphills or steps s sob   Cough: none  Sleeping: 30 degrees ok  SABA use: none  02: none  Overt HB when misses dose of ppi, seeing GI doc for colonoscopy who doesn't want her to maintain on ppi  rec GERD diet   Pantoprazole (protonix) 40 mg   Take  30-60 min before first meal of the day and Pepcid (famotidine)  20 mg one after supper  until return to office - this is the best  way to tell whether stomach acid is contributing to your problem.  Discuss this with your GI doctor (colon) Avoid salt and follow up with your PCP  The key is to stop smoking completely before smoking completely stops you!   03/26/2020  f/u ov/Baxter Gonzalez re: J&J in March 2021 for covid / gold II copd still smoking  Chief Complaint  Patient presents with   Follow-up    Breathing is doing well and she denies any new co's. She does not have a rescue inhaler.   Dyspnea: as long as no rush / no steps ok = MMRC1 = can walk nl pace, flat grade, can't hurry or go uphills or steps s sob   Cough: none  Sleeping: bed is flat pillows x 1  SABA use: none  02: none  rec No change rx   09/26/2020  f/u ov/Gerlad Pelzel re: GOLD II with reversibility / getting steroid shots from rheum most recently in august 2021/ still smoking  Chief Complaint  Patient  presents with   Follow-up    PFT completed today, no complaints   Dyspnea:  2 attacks where could not catch her while at home / relieved by sitting outside ? Compliant with symbicort 160 ? Panic ?  Cough: minimal  Sleeping: bed flat with 2 pillws  SABA use: none  02: none    No obvious day to day or daytime variability or assoc excess/ purulent sputum or mucus plugs or hemoptysis or cp or chest tightness, subjective wheeze or overt sinus or hb symptoms.   Sleeping  without nocturnal  or early am exacerbation  of respiratory  c/o's or need for noct saba. Also denies any obvious fluctuation of symptoms with weather or environmental changes or other aggravating or alleviating factors except as outlined above   No unusual exposure hx or h/o childhood pna/ asthma or knowledge of premature birth.  Current Allergies, Complete Past Medical History, Past Surgical History, Family History, and Social History were reviewed in [employer] record.  ROS  The following are not active complaints unless bolded Hoarseness, sore throat, dysphagia, dental problems, itching, sneezing,  nasal congestion or discharge of excess mucus or purulent secretions, ear ache,   fever, chills, sweats, unintended wt loss or wt gain, classically pleuritic or exertional cp,  orthopnea pnd or arm/hand swelling  or leg swelling, presyncope, palpitations, abdominal pain, anorexia, nausea, vomiting, diarrhea  or change in bowel habits or change in bladder habits, change in stools or change in urine, dysuria, hematuria,  rash, arthralgias, visual complaints, headache, numbness, weakness or ataxia or problems with walking or coordination,  change in mood or  memory.        Current Meds  Medication Sig   famotidine (PEPCID) 20 MG tablet One after suipper   meloxicam (MOBIC) 7.5 MG tablet Take 7.5 mg by mouth daily.   methotrexate 2.5 MG tablet    valACYclovir (VALTREX) 500 MG tablet Take 500 mg by mouth 2  (two) times daily as needed.   valsartan (DIOVAN) 80 MG tablet Take 80 mg by mouth daily.               Pex :  09/26/2020       166 03/26/2020        163  01/16/2020        163   09/26/2019     166   08/10/19 160 lb (72.6 kg)  07/24/19 160 lb (72.6 kg)  02/27/17 160 lb (72.6 kg)  Vital signs reviewed  09/26/2020  - Note at rest 02 sats  100% on RA    amb bf nad  HEENT : pt wearing mask not removed for exam due to covid - 19 concerns.   NECK :  without JVD/Nodes/TM/ nl carotid upstrokes bilaterally   LUNGS: no acc muscle use,  Min barrel  contour chest wall with bilateral  slightly decreased bs s audible wheeze and  without cough on insp or exp maneuvers and min  Hyperresonant  to  percussion bilaterally     CV:  RRR  no s3 or murmur or increase in P2, and no edema   ABD:  soft and nontender with pos end  insp Hoover's  in the supine position. No bruits or organomegaly appreciated, bowel sounds nl  MS:   Nl gait/  ext warm without deformities, calf tenderness, cyanosis or clubbing No obvious joint restrictions   SKIN: warm and dry without lesions    NEURO:  alert, approp, nl sensorium with  no motor or cerebellar deficits apparent.                     ASSESSMENT AND PLAN

## 2020-09-26 NOTE — Patient Instructions (Addendum)
Plan A = Automatic = Always=    Symbicort 169 Take 2 puffs first thing in am and then another 2 puffs about 12 hours later.   Work on inhaler technique:  relax and gently blow all the way out then take a nice smooth deep breath back in, triggering the inhaler at same time you start breathing in.  Hold for up to 5 seconds if you can. Blow out thru nose. Rinse and gargle with water when done    Plan B = Backup (to supplement plan A, not to replace it) Only use your albuterol inhaler as a rescue medication to be used if you can't catch your breath by resting or doing a relaxed purse lip breathing pattern.  - The less you use it, the better it will work when you need it. - Ok to use the inhaler up to 2 puffs  every 4 hours if you must but call for appointment if use goes up over your usual need - Don't leave home without it !!  (think of it like the spare tire for your car)    Please schedule a follow up visit in 12 months but call sooner if needed

## 2020-09-27 ENCOUNTER — Encounter: Payer: Self-pay | Admitting: Internal Medicine

## 2020-09-27 NOTE — Assessment & Plan Note (Signed)

## 2020-09-27 NOTE — Assessment & Plan Note (Signed)
Active smoker - PFT's  09/21/2019  FEV1 1.23 (52 % ) ratio 0.59  p 15 % improvement from saba p nothing prior to study with DLCO  10.74 (49%) corrects to 2.87 (67%)  for alv volume and FV curve classic concave f/v loop - 09/26/2019  After extensive coaching inhaler device,  effectiveness =    75% > try symb 160 2bid   - PFT's  09/26/2020  FEV1 1.31 (55 % ) ratio 0.57  p 17 % improvement from saba p 0 prior to study with DLCO  11.71 (54%) corrects to 3.08 (71%)  for alv volume and FV curve classic concavity     Group D in terms of symptom/risk and laba/lama/ICS  therefore appropriate rx at this point >>>  Best option is breztri but happy with symbicort 160 for now   >>> f/u yearly unless having aecopd > change to breztri

## 2021-01-02 ENCOUNTER — Other Ambulatory Visit: Payer: Self-pay

## 2021-01-02 ENCOUNTER — Ambulatory Visit (HOSPITAL_COMMUNITY)
Admission: EM | Admit: 2021-01-02 | Discharge: 2021-01-02 | Disposition: A | Discharge status: Home / Self Care | Payer: 59

## 2021-01-02 ENCOUNTER — Encounter (HOSPITAL_COMMUNITY): Payer: Self-pay | Admitting: Emergency Medicine

## 2021-01-02 DIAGNOSIS — M549 Dorsalgia, unspecified: Secondary | ICD-10-CM

## 2021-01-02 DIAGNOSIS — M545 Low back pain, unspecified: Secondary | ICD-10-CM

## 2021-01-02 MED ORDER — CYCLOBENZAPRINE HCL 10 MG PO TABS: 10.0000 mg | ORAL_TABLET | Freq: Two times a day (BID) | ORAL | 0 refills | Status: DC | PRN

## 2021-01-02 NOTE — Discharge Instructions (Signed)
1.muscle relaxer twice a day 2. Continue morning dose of Mobic, can take ibuprofen 600 mg in evening 3. Heating pad in 15 minutes intervals for additional comfort 4. Follow up if symptoms continue to worsen

## 2021-01-02 NOTE — ED Triage Notes (Signed)
Pt presents with lower back pain xs 2 days. States was in [facility] on Tuesday.
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
## 2021-05-12 ENCOUNTER — Other Ambulatory Visit: Payer: Self-pay | Admitting: Family Medicine

## 2021-05-12 DIAGNOSIS — Z1231 Encounter for screening mammogram for malignant neoplasm of breast: Secondary | ICD-10-CM

## 2021-07-04 ENCOUNTER — Ambulatory Visit
Admission: RE | Admit: 2021-07-04 | Discharge: 2021-07-04 | Disposition: A | Discharge status: Home / Self Care | Payer: 59 | Source: Ambulatory Visit

## 2021-07-04 ENCOUNTER — Other Ambulatory Visit: Payer: Self-pay

## 2021-07-04 DIAGNOSIS — Z1231 Encounter for screening mammogram for malignant neoplasm of breast: Secondary | ICD-10-CM

## 2021-07-09 ENCOUNTER — Other Ambulatory Visit: Payer: Self-pay | Admitting: Family Medicine

## 2021-07-09 DIAGNOSIS — R928 Other abnormal and inconclusive findings on diagnostic imaging of breast: Secondary | ICD-10-CM

## 2021-07-24 ENCOUNTER — Other Ambulatory Visit: Payer: Self-pay

## 2021-07-24 ENCOUNTER — Ambulatory Visit: Payer: 59

## 2021-07-24 ENCOUNTER — Ambulatory Visit
Admission: RE | Admit: 2021-07-24 | Discharge: 2021-07-24 | Disposition: A | Discharge status: Home / Self Care | Payer: 59 | Source: Ambulatory Visit | Attending: Family Medicine | Admitting: Family Medicine

## 2021-07-24 DIAGNOSIS — R928 Other abnormal and inconclusive findings on diagnostic imaging of breast: Secondary | ICD-10-CM

## 2021-12-20 ENCOUNTER — Ambulatory Visit (INDEPENDENT_AMBULATORY_CARE_PROVIDER_SITE_OTHER): Payer: 59

## 2021-12-20 ENCOUNTER — Encounter (HOSPITAL_COMMUNITY): Payer: Self-pay

## 2021-12-20 ENCOUNTER — Other Ambulatory Visit: Payer: Self-pay

## 2021-12-20 ENCOUNTER — Ambulatory Visit (HOSPITAL_COMMUNITY)
Admission: EM | Admit: 2021-12-20 | Discharge: 2021-12-20 | Disposition: A | Discharge status: Home / Self Care | Payer: 59 | Attending: Urgent Care | Admitting: Urgent Care

## 2021-12-20 DIAGNOSIS — J441 Chronic obstructive pulmonary disease with (acute) exacerbation: Secondary | ICD-10-CM | POA: Diagnosis not present

## 2021-12-20 MED ORDER — PREDNISONE 20 MG PO TABS: 20.0000 mg | ORAL_TABLET | Freq: Every day | ORAL | 0 refills | Status: AC

## 2021-12-20 MED ORDER — AMOXICILLIN-POT CLAVULANATE 875-125 MG PO TABS: 1.0000 | ORAL_TABLET | Freq: Two times a day (BID) | ORAL | 0 refills | Status: AC

## 2021-12-20 MED ORDER — GUAIFENESIN ER 600 MG PO TB12: 600.0000 mg | ORAL_TABLET | Freq: Two times a day (BID) | ORAL | 0 refills | Status: AC | PRN

## 2021-12-20 NOTE — Discharge Instructions (Signed)
Please start taking prednisone and augmentin as prescribed to aid in cough and sputum. Please also take mucinex twice daily, you have to increase your water intake. Follow up with your PCP in 1-2 weeks for recheck. Please talk with PCP about smoking cessation techniques

## 2021-12-20 NOTE — ED Triage Notes (Signed)
Pt presents with productive cough with yellow mucus that causes pain in chest when she coughs, sore throat, headache, back pain, and chills
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
## 2022-12-11 ENCOUNTER — Other Ambulatory Visit: Payer: Self-pay | Admitting: Family Medicine

## 2022-12-11 DIAGNOSIS — Z1231 Encounter for screening mammogram for malignant neoplasm of breast: Secondary | ICD-10-CM

## 2022-12-24 ENCOUNTER — Ambulatory Visit
Admission: RE | Admit: 2022-12-24 | Discharge: 2022-12-24 | Disposition: A | Discharge status: Home / Self Care | Payer: 59 | Source: Ambulatory Visit

## 2022-12-24 DIAGNOSIS — Z1231 Encounter for screening mammogram for malignant neoplasm of breast: Secondary | ICD-10-CM

## 2023-03-03 LAB — RESULTS CONSOLE HPV: CHL HPV: NEGATIVE

## 2023-03-03 LAB — HM PAP SMEAR: HM Pap smear: NEGATIVE

## 2023-10-19 IMAGING — DX DG CHEST 2V
2 series · 2 of 2 positions shown · non-contrast
Comparison: 03/26/2020

CLINICAL DATA: COPD/asthma.

EXAM:
CHEST - 2 VIEW

[chest pa]
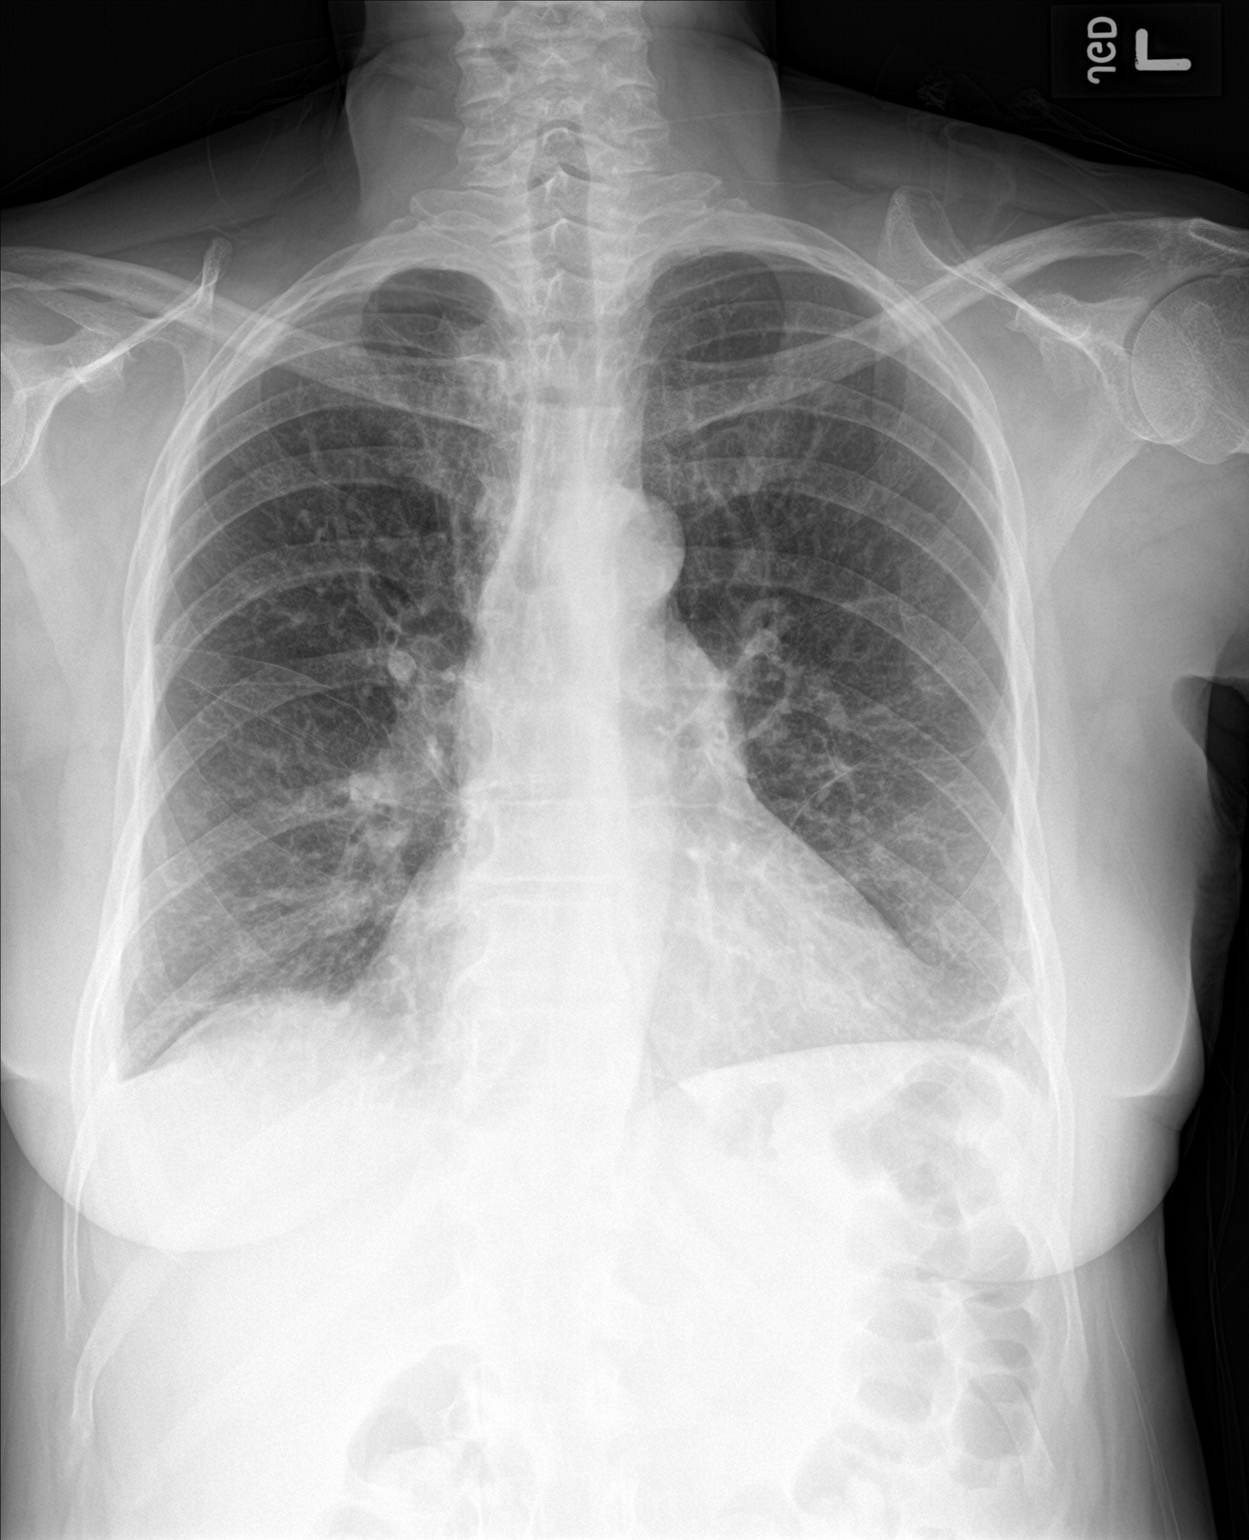

[chest lat]
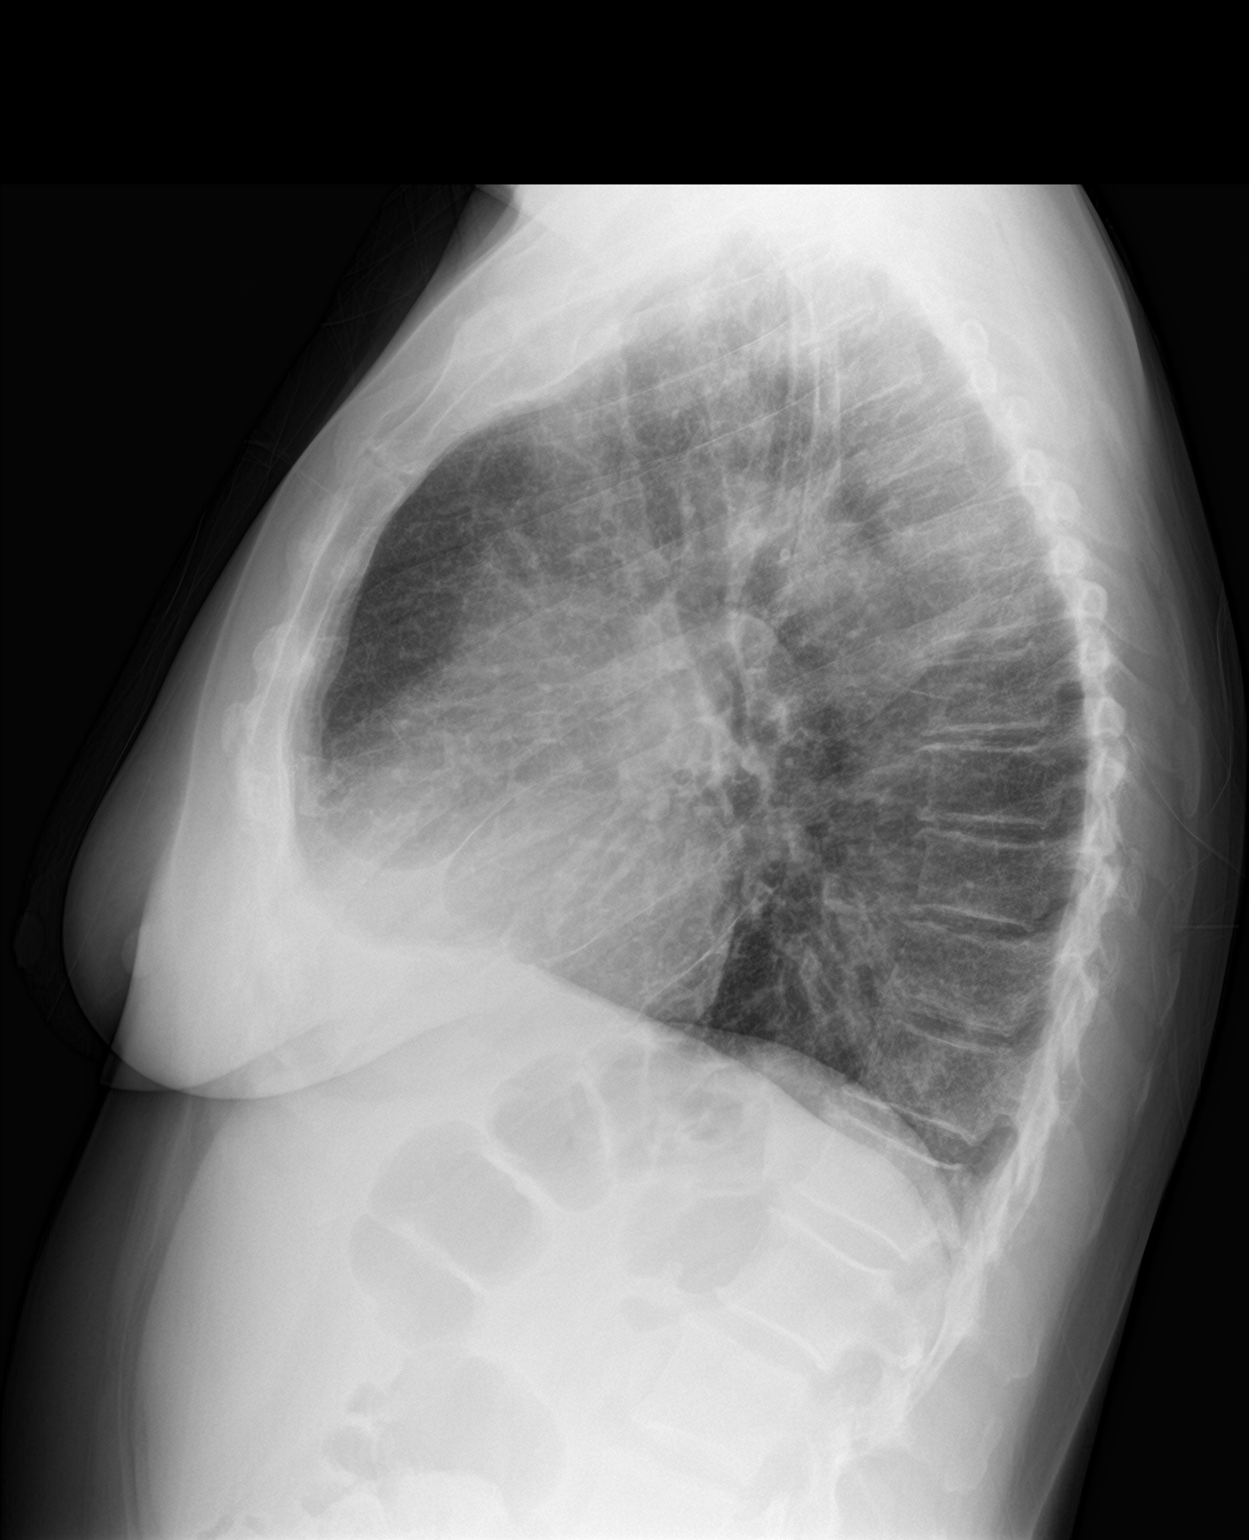

[2 of 2 positions shown; findings below may reference images not displayed]

FINDINGS: Heart size and mediastinal contours are unremarkable. No pleural
effusion or edema. Scar versus platelike atelectasis noted within
both lung bases. No airspace consolidation identified.
IMPRESSION: Scar versus atelectasis within both lung bases.

## 2023-11-16 ENCOUNTER — Ambulatory Visit: Payer: Self-pay | Admitting: Allergy & Immunology

## 2023-11-25 ENCOUNTER — Inpatient Hospital Stay (HOSPITAL_COMMUNITY)
Admission: EM | Admit: 2023-11-25 | Discharge: 2023-11-27 | DRG: 830 | Disposition: A | Discharge status: Home / Self Care | Payer: 59 | Attending: Internal Medicine | Admitting: Internal Medicine

## 2023-11-25 ENCOUNTER — Emergency Department (HOSPITAL_COMMUNITY): Payer: 59

## 2023-11-25 ENCOUNTER — Other Ambulatory Visit: Payer: Self-pay

## 2023-11-25 ENCOUNTER — Encounter (HOSPITAL_COMMUNITY): Payer: Self-pay

## 2023-11-25 DIAGNOSIS — M069 Rheumatoid arthritis, unspecified: Secondary | ICD-10-CM | POA: Diagnosis present

## 2023-11-25 DIAGNOSIS — Z8042 Family history of malignant neoplasm of prostate: Secondary | ICD-10-CM

## 2023-11-25 DIAGNOSIS — I10 Essential (primary) hypertension: Secondary | ICD-10-CM | POA: Diagnosis present

## 2023-11-25 DIAGNOSIS — E079 Disorder of thyroid, unspecified: Secondary | ICD-10-CM | POA: Diagnosis not present

## 2023-11-25 DIAGNOSIS — E041 Nontoxic single thyroid nodule: Secondary | ICD-10-CM

## 2023-11-25 DIAGNOSIS — J449 Chronic obstructive pulmonary disease, unspecified: Secondary | ICD-10-CM | POA: Diagnosis present

## 2023-11-25 DIAGNOSIS — Z8249 Family history of ischemic heart disease and other diseases of the circulatory system: Secondary | ICD-10-CM | POA: Diagnosis not present

## 2023-11-25 DIAGNOSIS — C7A1 Malignant poorly differentiated neuroendocrine tumors: Secondary | ICD-10-CM | POA: Diagnosis present

## 2023-11-25 DIAGNOSIS — Z791 Long term (current) use of non-steroidal anti-inflammatories (NSAID): Secondary | ICD-10-CM

## 2023-11-25 DIAGNOSIS — Z79899 Other long term (current) drug therapy: Secondary | ICD-10-CM | POA: Diagnosis not present

## 2023-11-25 DIAGNOSIS — F17210 Nicotine dependence, cigarettes, uncomplicated: Secondary | ICD-10-CM | POA: Diagnosis present

## 2023-11-25 DIAGNOSIS — Z833 Family history of diabetes mellitus: Secondary | ICD-10-CM

## 2023-11-25 DIAGNOSIS — R222 Localized swelling, mass and lump, trunk: Principal | ICD-10-CM

## 2023-11-25 DIAGNOSIS — R918 Other nonspecific abnormal finding of lung field: Secondary | ICD-10-CM | POA: Diagnosis present

## 2023-11-25 DIAGNOSIS — Z803 Family history of malignant neoplasm of breast: Secondary | ICD-10-CM | POA: Diagnosis not present

## 2023-11-25 DIAGNOSIS — Z7951 Long term (current) use of inhaled steroids: Secondary | ICD-10-CM

## 2023-11-25 HISTORY — DX: Chronic obstructive pulmonary disease, unspecified: J44.9

## 2023-11-25 LAB — I-STAT CHEM 8, ED
BUN: 27 mg/dL — ABNORMAL HIGH (ref 6–20)
Calcium, Ion: 1.13 mmol/L — ABNORMAL LOW (ref 1.15–1.40)
Chloride: 103 mmol/L (ref 98–111)
Creatinine, Ser: 1.6 mg/dL — ABNORMAL HIGH (ref 0.44–1.00)
Glucose, Bld: 114 mg/dL — ABNORMAL HIGH (ref 70–99)
HCT: 42 % (ref 36.0–46.0)
Hemoglobin: 14.3 g/dL (ref 12.0–15.0)
Potassium: 3.9 mmol/L (ref 3.5–5.1)
Sodium: 139 mmol/L (ref 135–145)
TCO2: 26 mmol/L (ref 22–32)

## 2023-11-25 MED ORDER — OXYCODONE HCL 5 MG PO TABS
5.0000 mg | ORAL_TABLET | Freq: Once | ORAL | Status: AC
  Administered 2023-11-25: 5 mg via ORAL
  Filled 2023-11-25: qty 1

## 2023-11-25 MED ORDER — ACETAMINOPHEN 500 MG PO TABS
1000.0000 mg | ORAL_TABLET | Freq: Once | ORAL | Status: AC
  Administered 2023-11-25: 1000 mg via ORAL
  Filled 2023-11-25: qty 2

## 2023-11-25 MED ORDER — SODIUM CHLORIDE 0.9 % IV BOLUS
1000.0000 mL | Freq: Once | INTRAVENOUS | Status: AC
  Administered 2023-11-25: 1000 mL via INTRAVENOUS

## 2023-11-25 MED ORDER — IOHEXOL 300 MG/ML  SOLN
60.0000 mL | Freq: Once | INTRAMUSCULAR | Status: AC | PRN
  Administered 2023-11-25: 60 mL via INTRAVENOUS

## 2023-11-25 MED ORDER — LORAZEPAM 2 MG/ML IJ SOLN
1.0000 mg | Freq: Once | INTRAMUSCULAR | Status: AC
  Administered 2023-11-25: 1 mg via INTRAVENOUS
  Filled 2023-11-25: qty 1

## 2023-11-25 MED ORDER — OXYCODONE HCL 5 MG PO TABS
5.0000 mg | ORAL_TABLET | ORAL | Status: DC | PRN
  Administered 2023-11-26: 5 mg via ORAL
  Filled 2023-11-25: qty 1

## 2023-11-25 MED ORDER — GADOBUTROL 1 MMOL/ML IV SOLN
7.0000 mL | Freq: Once | INTRAVENOUS | Status: AC | PRN
  Administered 2023-11-25: 7 mL via INTRAVENOUS

## 2023-11-25 NOTE — ED Notes (Signed)
Pt returned from MRI °

## 2023-11-25 NOTE — ED Triage Notes (Signed)
Pt complaining of right arm pain that has been present for apprx 6 months. Pt states lately pain has increased to an unbearable level. Pt also complaining of a knot on the right side of neck that appeared last Saturday. Endorses pain when pressing down on it.

## 2023-11-25 NOTE — ED Provider Notes (Signed)
4:10 PM Patient signed out to me by previous ED physician. Pt is a 57 yo female presenting for right UE pain with mass in neck.    CT neck demonstrates  Right supraclavicular mass and partially imaged superior mediastinal mass.  Soft tissue neck pending.  CT chest pending. Physical Exam  BP 108/65   Pulse 78   Temp 98 F (36.7 C) (Oral)   Resp 17   Ht 5\' 6"  (1.676 m)   Wt 70.3 kg   LMP  (LMP Unknown)   SpO2 95%   BMI 25.02 kg/m   Physical Exam  Procedures  Procedures  ED Course / MDM    Medical Decision Making Amount and/or Complexity of Data Reviewed Radiology: ordered.  Risk OTC drugs. Prescription drug management. Decision regarding hospitalization.    CT chest demonstrates: IMPRESSION: 1. Presumed right upper lobe lung mass invading the mediastinum and displacing the trachea and esophagus to the left, measuring 6.6 cm in long axis. Adjacent right suprahilar nodularity including a 3.1 by 1.8 cm tangential mass. The B1A apical segmental bronchus is occluded. 2. Right lower neck level V lymph node measures 2.4 cm in short axis. Right supraclavicular node 0.9 cm in short axis. Index bilateral axillary nodes are mildly enlarged. Findings are compatible with metastatic adenopathy. 3. 1.5 cm hypodense left thyroid lobe lesion. Recommend thyroid US (ref: J Am Coll Radiol. 2015 Feb;12(2): 143-50). 4. Moderate cardiomegaly. 5. Aortic and coronary atherosclerosis.   MRI thoracic spine demonstrates: 1. Enhancing mass in the right lung, invading the mediastinum, which was better evaluated on the 11/25/2023 CT chest. This mass is adjacent to the anterior right aspect of the C7-T4 vertebral bodies, but the cortical signal appears preserved, and no definite osseous invasion is seen. The mass appears to invade the right neural foramen at T1-T2, and may abut the exiting right T1 nerve roots. 2. No evidence of metastatic disease in the thoracic spine.  Oncology on  and will be following.  Patient accepted for admission.     Edwin Dada P, DO 11/25/23 2240

## 2023-11-25 NOTE — ED Provider Notes (Signed)
[place] EMERGENCY DEPARTMENT AT [facility] Provider Note   CSN: [id number] Arrival date & time: 11/25/23  4098     History  Chief Complaint  Patient presents with   Arm Pain   Neck Issue.    Tammy Cordova is a 57 y.o. female.  57 yo F with a chief complaints of right-sided arm pain and new neck mass.  This has been going on for about 6 months the arm pain.  She is it shoots from mostly the posterior aspect of the shoulder down the arm.  She denies any injury denies it being weak or numb.  She noticed that she had a new mass in her neck today.  She has a history of lymph nodes being removed sometime ago.  Was told that they were benign.   Arm Pain       Home Medications Prior to Admission medications   Medication Sig Start Date End Date Taking? Authorizing Provider  albuterol (PROAIR HFA) 108 (90 Base) MCG/ACT inhaler 2 puffs every 4 hours as needed only  if your can't catch your breath 09/26/20   Nyoka Cowden, MD  budesonide-formoterol [facility]) 160-4.5 MCG/ACT inhaler Take 2 puffs first thing in am and then another 2 puffs about 12 hours later. 09/26/20   Nyoka Cowden, MD  cyclobenzaprine (FLEXERIL) 10 MG tablet Take 1 tablet (10 mg total) by mouth 2 (two) times daily as needed for muscle spasms. 01/02/21   Valinda Hoar, NP  famotidine (PEPCID) 20 MG tablet One after suipper 01/16/20   Nyoka Cowden, MD  meloxicam (MOBIC) 7.5 MG tablet Take 7.5 mg by mouth daily. 03/19/20   [provider]  methotrexate 2.5 MG tablet  09/04/20   [provider]  valACYclovir (VALTREX) 500 MG tablet Take 500 mg by mouth 2 (two) times daily as needed. 07/10/20   [provider]  valsartan (DIOVAN) 80 MG tablet Take 80 mg by mouth daily. 02/20/20   [provider]      Allergies    Patient has no known allergies.    Review of Systems   Review of Systems  Physical Exam Updated Vital Signs BP 108/65   Pulse 78   Temp 98 F  (36.7 C) (Oral)   Resp 17   Ht 5\' 6"  (1.676 m)   Wt 70.3 kg   LMP  (LMP Unknown)   SpO2 95%   BMI 25.02 kg/m  Physical Exam Vitals and nursing note reviewed.  Constitutional:      General: She is not in acute distress.    Appearance: She is well-developed. She is not diaphoretic.  HENT:     Head: Normocephalic and atraumatic.  Eyes:     Pupils: Pupils are equal, round, and reactive to light.  Cardiovascular:     Rate and Rhythm: Normal rate and regular rhythm.     Heart sounds: No murmur heard.    No friction rub. No gallop.  Pulmonary:     Effort: Pulmonary effort is normal.     Breath sounds: No wheezing or rales.  Abdominal:     General: There is no distension.     Palpations: Abdomen is soft.     Tenderness: There is no abdominal tenderness.  Musculoskeletal:        General: No tenderness.     Cervical back: Normal range of motion and neck supple.     Comments: Arthritic changes to the hands bilaterally.  Pulse motor and  sensation are intact.  No weakness.  She has a mass just above the right clavicle along the medial aspect.  Seems tender.  Skin:    General: Skin is warm and dry.  Neurological:     Mental Status: She is alert and oriented to person, place, and time.  Psychiatric:        Behavior: Behavior normal.     ED Results / Procedures / Treatments   Labs (all labs ordered are listed, but only abnormal results are displayed) Labs Reviewed  I-STAT CHEM 8, ED - Abnormal; Notable for the following components:      Result Value   BUN 27 (*)    Creatinine, Ser 1.60 (*)    Glucose, Bld 114 (*)    Calcium, Ion 1.13 (*)    All other components within normal limits    EKG EKG Interpretation Date/Time:  Thursday November 25 2023 11:50:27 EST Ventricular Rate:  78 PR Interval:  169 QRS Duration:  96 QT Interval:  408 QTC Calculation: 465 R Axis:   25  Text Interpretation: Sinus rhythm Consider left atrial enlargement Minimal ST elevation, inferior  leads Since last tracing rate slower Otherwise no significant change Confirmed by Melene Plan [phone]) on 11/25/2023 1:11:36 PM  Radiology CT Soft Tissue Neck W Contrast Result Date: 11/25/2023 CLINICAL DATA:  Neck mass, non pulsatile lymph node. Patient reports a palpable knot in the right side his neck. EXAM: CT NECK WITH CONTRAST TECHNIQUE: Multidetector CT imaging of the neck was performed using the standard protocol following the bolus administration of intravenous contrast. RADIATION DOSE REDUCTION: This exam was performed according to the departmental dose-optimization program which includes automated exposure control, adjustment of the mA and/or kV according to patient size and/or use of iterative reconstruction technique. CONTRAST:  60mL OMNIPAQUE IOHEXOL 300 MG/ML  SOLN COMPARISON:  CT cervical spine without contrast of the same day. FINDINGS: Pharynx and larynx: Mild adenoid hypertrophy is noted. No discrete lesion is present. The oropharynx is within normal limits. Tongue base is. Vallecula and epiglottis are within normal limits. Aryepiglottic folds and piriform sinuses are clear. Vocal cords are midline and symmetric. Trachea is clear. Salivary glands: The submandibular and parotid glands and ducts are within normal limits. Thyroid: Heterogeneous nodule the inferior left lobe measures 2.2 x 2.2 cm on sagittal images. Lymph nodes: A right supraclavicular nodal mass measures 3.6 x 2.4 x 3.7 cm. A right level 4 heterogeneous node measures 15 mm. No significant upper cervical adenopathy is present. A heterogeneous right paraspinous mass begins at the level of C6-7 and extends into the mediastinum. Tumor surrounds the proximal first rib. Tumor extends into the right T1-2 foramen. No discrete osseous destruction is present. Vascular: No significant vascular calcifications are present. Limited intracranial: Within normal limits. Visualized orbits: The globes and orbits are within normal limits. Mastoids  and visualized paranasal sinuses: The paranasal sinuses and mastoid air cells are clear. Skeleton: Multilevel degenerative changes are present in the cervical spine. Uncovertebral spurring contributes to right foraminal stenosis at C6-7. No focal osseous lesions are present. Upper chest: Extensive centrilobular emphysematous changes are present. IMPRESSION: 1. Heterogeneous right paraspinous mass begins at the level of C6-7 and extends into the mediastinum. Tumor surrounds the proximal first rib. Tumor extends into the right T1-2 foramen. This is most concerning for a lung primary neoplasm. Recommend CT of the chest with and without contrast as well as MRI of the thoracic spine without and with contrast 2. Right supraclavicular nodal mass measures  3.6 x 2.4 x 3.7 cm, consistent with metastatic disease. 3. Right level 4 heterogeneous node measures 15 mm. 4. Heterogeneous nodule the inferior left lobe measures 2.2 x 2.2 cm on sagittal images. Recommend non-emergent thyroid ultrasound. Reference: J Am Coll Radiol. 2015 Feb;12(2): 143-50 5. Multilevel degenerative changes in the cervical spine. 6.  Emphysema (ICD10-J43.9). Electronically Signed   By: Marin Roberts M.D.   On: 11/25/2023 16:19   CT Cervical Spine Wo Contrast Result Date: 11/25/2023 CLINICAL DATA:  Cervical radiculopathy, infection suspected, no prior imaging EXAM: CT CERVICAL SPINE WITHOUT CONTRAST TECHNIQUE: Multidetector CT imaging of the cervical spine was performed without intravenous contrast. Multiplanar CT image reconstructions were also generated. RADIATION DOSE REDUCTION: This exam was performed according to the departmental dose-optimization program which includes automated exposure control, adjustment of the mA and/or kV according to patient size and/or use of iterative reconstruction technique. COMPARISON:  None Available. FINDINGS: Alignment: Facet joints are aligned without dislocation or traumatic listhesis. Dens and lateral  masses are aligned. Skull base and vertebrae: No acute fracture of the cervical spine. Heterogeneity of the osseous structures although no well-defined lytic or sclerotic bone lesion. Soft tissues and spinal canal: No prevertebral fluid or swelling. No visible canal hematoma. Disc levels: Mild degenerative endplate spurring. Slight disc height loss at C6-7. Minimal facet joint spurring. Upper chest: Right supraclavicular mass and partially imaged superior mediastinal mass. 1.7 cm left thyroid lobe nodule. Other: None. IMPRESSION: 1. No acute fracture or traumatic listhesis of the cervical spine. 2. Right supraclavicular mass and partially imaged superior mediastinal mass. Attention on forthcoming CT of the neck. Contrast enhanced CT of the chest is recommended to more fully assess the mediastinal mass. 3. 1.7 cm left thyroid lobe nodule. Recommend nonemergent thyroid US (ref: J Am Coll Radiol. 2015 Feb;12(2): 143-50). Electronically Signed   By: Duanne Guess D.O.   On: 11/25/2023 15:32    Procedures Procedures    Medications Ordered in ED Medications  acetaminophen (TYLENOL) tablet 1,000 mg (1,000 mg Oral Given 11/25/23 1312)  oxyCODONE (Oxy IR/ROXICODONE) immediate release tablet 5 mg (5 mg Oral Given 11/25/23 1312)  iohexol (OMNIPAQUE) 300 MG/ML solution 60 mL (60 mLs Intravenous Contrast Given 11/25/23 1411)  sodium chloride 0.9 % bolus 1,000 mL (1,000 mLs Intravenous New Bag/Given 11/25/23 1606)    ED Course/ Medical Decision Making/ A&P                                 Medical Decision Making Amount and/or Complexity of Data Reviewed Radiology: ordered.  Risk OTC drugs. Prescription drug management.   57 yo F with a cc of right arm pain and a new neck mass.  The patient had right arm pain for about 6 months.  Feels like its gotten progressively worse.  She also noted a new mass to the base of the right side of the neck just above the clavicle.  Noticed that this morning.  Will  obtain CT imaging.  CT of the C-spine read as needing CT of the chest with concern for mediastinal mass.  The CT tech was worried about her GFR and her already receiving a contrast load.  I discussed this with the radiologist.  He felt it would be reasonable to get it without contrast if she had gotten more than 50 cc of contrast with her soft tissue study.  CT read of the soft tissue study now has resulted and there  is also concern about possible cord involvement.  Recommending thoracic MRI.  Signed out to Dr. Wallace Cullens, please see their note for further details.  The patients results and plan were reviewed and discussed.   Any x-rays performed were independently reviewed by myself.   Differential diagnosis were considered with the presenting HPI.  Medications  acetaminophen (TYLENOL) tablet 1,000 mg (1,000 mg Oral Given 11/25/23 1312)  oxyCODONE (Oxy IR/ROXICODONE) immediate release tablet 5 mg (5 mg Oral Given 11/25/23 1312)  iohexol (OMNIPAQUE) 300 MG/ML solution 60 mL (60 mLs Intravenous Contrast Given 11/25/23 1411)  sodium chloride 0.9 % bolus 1,000 mL (1,000 mLs Intravenous New Bag/Given 11/25/23 1606)    Vitals:   11/25/23 1407 11/25/23 1430 11/25/23 1530 11/25/23 1545  BP:  110/72 108/65   Pulse:  86 82 78  Resp:  18 17   Temp: 98 F (36.7 C)     TempSrc: Oral     SpO2:  96% 97% 95%  Weight:      Height:        Final diagnoses:  Supraclavicular mass           Final Clinical Impression(s) / ED Diagnoses Final diagnoses:  Supraclavicular mass    Rx / DC Orders ED Discharge Orders     None         Melene Plan, DO 11/25/23 1626

## 2023-11-25 NOTE — ED Notes (Addendum)
Patient transported to CT 

## 2023-11-25 NOTE — ED Notes (Signed)
Went to lobby called pt twice no one answered and no one in girls bathroom.

## 2023-11-25 NOTE — Assessment & Plan Note (Addendum)
-  1.5 cm hypodense left thyroid lobe lesion. Will need thyroid ultrasound or PET-CT with oncology.

## 2023-11-25 NOTE — H&P (Signed)
History and Physical    Patient: Tammy Cordova JYN:[id number] DOB: 01/08/1966 DOA: 11/25/2023 DOS: the patient was seen and examined on 11/26/2023 PCP: Ollen Bowl, MD  Patient coming from: Home  Chief Complaint:  Chief Complaint  Patient presents with   Arm Pain   Neck Issue.   HPI: Tammy Cordova is a 57 y.o. female with medical history significant of COPD, ongoing tobacco use who presents with right neck mass and pain.  Reports 3-6 months of right shoulder and arm pain but did not seek medical care as she thought it was related to axillary lymph node removal she had in the past for unclear reason. Then last week noted new right lateral neck pain. Has intermittent cough but has COPD. No chest pain. Has nausea and decrease intake. Weight fluctuates but no more than 5lbs weight loss recently. Hx of tobacco use since the 1980s up to a pack daily but quit last week.    On arrival to the ED, she was afebrile and normotensive on room air.  Chem-8 did not demonstrate any anemia.  Creatinine was elevated at 1.6 with no remote prior for comparison.  CT imaging of the chest, cervical spine, soft tissue unfortunately demonstrated a new right upper lobe mass invading the mediastinum and displacing the trachea and esophagus to the left.  MRI of the thoracic spine with no evidence of metastatic disease to the spine although lung mass may be invading the right neural foramen at T1-T2.  ED consulted oncology who recommends admission for biopsy in the morning. Review of Systems: As mentioned in the history of present illness. All other systems reviewed and are negative. Past Medical History:  Diagnosis Date   COPD (chronic obstructive pulmonary disease) (HCC)    Pneumonia    Past Surgical History:  Procedure Laterality Date   BREAST BIOPSY Left 04/2016   REACTIVE LYMPHOID HYPERPLASIA    BUNIONECTOMY     bilateral   Social History:  reports that she has been smoking cigarettes. She  has a 10.8 pack-year smoking history. She has never used smokeless tobacco. She reports current drug use. Drug: Marijuana. She reports that she does not drink alcohol.  No Known Allergies  Family History  Problem Relation Age of Onset   Breast cancer Paternal Grandmother 20   Diabetes Mother    Hypertension Mother    Diabetes Father    Hypertension Father    Cancer Father     Prior to Admission medications   Medication Sig Start Date End Date Taking? Authorizing Provider  albuterol (PROAIR HFA) 108 (90 Base) MCG/ACT inhaler 2 puffs every 4 hours as needed only  if your can't catch your breath 09/26/20   Nyoka Cowden, MD  budesonide-formoterol [facility]) 160-4.5 MCG/ACT inhaler Take 2 puffs first thing in am and then another 2 puffs about 12 hours later. 09/26/20   Nyoka Cowden, MD  cyclobenzaprine (FLEXERIL) 10 MG tablet Take 1 tablet (10 mg total) by mouth 2 (two) times daily as needed for muscle spasms. 01/02/21   Valinda Hoar, NP  famotidine (PEPCID) 20 MG tablet One after suipper 01/16/20   Nyoka Cowden, MD  meloxicam (MOBIC) 15 MG tablet Take 15 mg by mouth daily.    [provider]  meloxicam (MOBIC) 7.5 MG tablet Take 7.5 mg by mouth daily. 03/19/20   [provider]  methotrexate 2.5 MG tablet  09/04/20   [provider]  pantoprazole (PROTONIX) 40 MG tablet Take 40  mg by mouth every morning.    [provider]  valACYclovir (VALTREX) 500 MG tablet Take 500 mg by mouth 2 (two) times daily as needed. 07/10/20   [provider]  valsartan (DIOVAN) 320 MG tablet Take 320 mg by mouth daily.    [provider]  valsartan (DIOVAN) 80 MG tablet Take 80 mg by mouth daily. 02/20/20   [provider]    Physical Exam: Vitals:   11/25/23 1800 11/25/23 2015 11/25/23 2133 11/25/23 2230  BP: 110/67 113/70 134/74 126/76  Pulse: 78 86 82 82  Resp: 18 17 18 17   Temp:   98.1 F (36.7 C)   TempSrc:   Oral   SpO2: 93%  94% 90% 94%  Weight:      Height:       Constitutional: NAD, calm, comfortable, middle age female sitting upright in bed Eyes: lids and conjunctivae normal ENMT: Mucous membranes are moist. Neck: normal, supple, right lateral cervical firm fixed mass on SCM. Medial Right clavicle also more boggy compare to left.  Respiratory: clear to auscultation bilaterally, no wheezing, no crackles. Normal respiratory effort. No accessory muscle use. On room air.  Cardiovascular: Regular rate and rhythm, no murmurs / rubs / gallops. No extremity edema.  Abdomen: no tenderness, no masses palpated. Soft. Bowel sounds positive.  Musculoskeletal: no clubbing / cyanosis. See exam on neck. Pt has equal extension and abduction of bilateral UE.  Skin: no rashes, lesions, ulcers. No induration Neurologic: CN 2-12 grossly intact. Strength 5/5 in all 4. Symmetry and equal bilateral shoulder shurt.  Psychiatric: Normal judgment and insight. Alert and oriented x 3. Normal mood.   Data Reviewed:  See HPI   Assessment and Plan: * Mass of lung - Patient with history of chronic tobacco use with CT imaging demonstrating new right upper lobe mass invading the mediastinum displacing the trachea and esophagus to the left.  There is also invasion into the right first rib and potentially into the T1-T2 right neuro foramen and may abut the exiting right T1 nerve root.  - Oncology consulted and will need arrangement with IR tomorrow for biopsy. Secure chat also sent to Dr. Mitzi Hansen for likelihood of needing radiation.   Thyroid lesion -1.5 cm hypodense left thyroid lobe lesion. Will need thyroid ultrasound or PET-CT with oncology.  COPD  GOLD II, group C still smoking  -not in exacerbation -pt stopped tobacco use last week      Advance Care Planning: Full  Consults: oncology, rad-onc  Family Communication: husband at bedside   Severity of Illness: The appropriate patient status for this patient is INPATIENT.  Inpatient status is judged to be reasonable and necessary in order to provide the required intensity of service to ensure the patient's safety. The patient's presenting symptoms, physical exam findings, and initial radiographic and laboratory data in the context of their chronic comorbidities is felt to place them at high risk for further clinical deterioration. Furthermore, it is not anticipated that the patient will be medically stable for discharge from the hospital within 2 midnights of admission.   * I certify that at the point of admission it is my clinical judgment that the patient will require inpatient hospital care spanning beyond 2 midnights from the point of admission due to high intensity of service, high risk for further deterioration and high frequency of surveillance required.*  Author: Anselm Jungling, DO 11/26/2023 12:19 AM  For on call review [email or web address].

## 2023-11-25 NOTE — Assessment & Plan Note (Addendum)
-   Patient with history of chronic tobacco use with CT imaging demonstrating new right upper lobe mass invading the mediastinum displacing the trachea and esophagus to the left.  There is also invasion into the right first rib and potentially into the T1-T2 right neuro foramen and may abut the exiting right T1 nerve root.  - Oncology consulted and will need arrangement with IR tomorrow for biopsy. Secure chat also sent to Dr. Mitzi Hansen for likelihood of needing radiation

## 2023-11-25 NOTE — ED Notes (Signed)
Patient transported to MRI 

## 2023-11-26 ENCOUNTER — Ambulatory Visit
Admit: 2023-11-26 | Discharge: 2023-11-26 | Disposition: A | Discharge status: Home / Self Care | Payer: 59 | Attending: Radiation Oncology | Admitting: Radiation Oncology

## 2023-11-26 ENCOUNTER — Inpatient Hospital Stay (HOSPITAL_COMMUNITY): Payer: 59

## 2023-11-26 ENCOUNTER — Encounter: Payer: Self-pay | Admitting: *Deleted

## 2023-11-26 DIAGNOSIS — R918 Other nonspecific abnormal finding of lung field: Secondary | ICD-10-CM

## 2023-11-26 LAB — CBC
HCT: 39.3 % (ref 36.0–46.0)
Hemoglobin: 12.3 g/dL (ref 12.0–15.0)
MCH: 27.8 pg (ref 26.0–34.0)
MCHC: 31.3 g/dL (ref 30.0–36.0)
MCV: 88.7 fL (ref 80.0–100.0)
Platelets: 303 10*3/uL (ref 150–400)
RBC: 4.43 MIL/uL (ref 3.87–5.11)
RDW: 14.8 % (ref 11.5–15.5)
WBC: 3.8 10*3/uL — ABNORMAL LOW (ref 4.0–10.5)
nRBC: 0 % (ref 0.0–0.2)

## 2023-11-26 LAB — PROTIME-INR
INR: 1 (ref 0.8–1.2)
Prothrombin Time: 13.8 s (ref 11.4–15.2)

## 2023-11-26 LAB — BASIC METABOLIC PANEL
Anion gap: 6 (ref 5–15)
BUN: 19 mg/dL (ref 6–20)
CO2: 22 mmol/L (ref 22–32)
Calcium: 8 mg/dL — ABNORMAL LOW (ref 8.9–10.3)
Chloride: 105 mmol/L (ref 98–111)
Creatinine, Ser: 0.69 mg/dL (ref 0.44–1.00)
GFR, Estimated: >60 mL/min (ref >60)
Glucose, Bld: 84 mg/dL (ref 70–99)
Potassium: 3.8 mmol/L (ref 3.5–5.1)
Sodium: 133 mmol/L — ABNORMAL LOW (ref 135–145)

## 2023-11-26 LAB — HIV ANTIBODY (ROUTINE TESTING W REFLEX): HIV Screen 4th Generation wRfx: NONREACTIVE

## 2023-11-26 MED ORDER — GADOBUTROL 1 MMOL/ML IV SOLN
7.0000 mL | Freq: Once | INTRAVENOUS | Status: AC | PRN
  Administered 2023-11-26: 7 mL via INTRAVENOUS

## 2023-11-26 MED ORDER — ONDANSETRON HCL 4 MG/2ML IJ SOLN: 4.0000 mg | Freq: Four times a day (QID) | INTRAMUSCULAR | Status: DC | PRN

## 2023-11-26 MED ORDER — ALUM & MAG HYDROXIDE-SIMETH 200-200-20 MG/5ML PO SUSP
15.0000 mL | ORAL | Status: DC | PRN
  Administered 2023-11-26: 15 mL via ORAL
  Filled 2023-11-26: qty 30

## 2023-11-26 MED ORDER — SODIUM CHLORIDE 0.9 % IV SOLN
20.0000 mg | Freq: Once | INTRAVENOUS | Status: AC
  Administered 2023-11-26: 20 mg via INTRAVENOUS
  Filled 2023-11-26: qty 2

## 2023-11-26 MED ORDER — ENSURE ENLIVE PO LIQD
237.0000 mL | Freq: Two times a day (BID) | ORAL | Status: DC
Start: 2023-11-27 — End: 2023-11-28
  Administered 2023-11-27: 237 mL via ORAL

## 2023-11-26 MED ORDER — DEXAMETHASONE SODIUM PHOSPHATE 4 MG/ML IJ SOLN: 20.0000 mg | Freq: Once | INTRAMUSCULAR | Status: DC

## 2023-11-26 MED ORDER — PANTOPRAZOLE SODIUM 40 MG PO TBEC
40.0000 mg | DELAYED_RELEASE_TABLET | Freq: Two times a day (BID) | ORAL | Status: DC
  Administered 2023-11-26 – 2023-11-27 (×2): 40 mg via ORAL
  Filled 2023-11-26 (×2): qty 1

## 2023-11-26 MED ORDER — LORAZEPAM 2 MG/ML IJ SOLN
1.0000 mg | INTRAMUSCULAR | Status: AC | PRN
  Administered 2023-11-26: 1 mg via INTRAVENOUS
  Filled 2023-11-26: qty 1

## 2023-11-26 MED ORDER — MORPHINE SULFATE (PF) 2 MG/ML IV SOLN: 1.0000 mg | INTRAVENOUS | Status: DC | PRN

## 2023-11-26 MED ORDER — ZOLEDRONIC ACID 4 MG/5ML IV CONC: 4.0000 mg | Freq: Once | INTRAVENOUS | Status: DC

## 2023-11-26 MED ORDER — LIDOCAINE HCL 1 % IJ SOLN
INTRAMUSCULAR | Status: AC
Start: 2023-11-26 — End: ?
  Filled 2023-11-26: qty 20

## 2023-11-26 MED ORDER — ZOLEDRONIC ACID 4 MG/100ML IV SOLN
4.0000 mg | Freq: Once | INTRAVENOUS | Status: AC
  Administered 2023-11-26: 4 mg via INTRAVENOUS
  Filled 2023-11-26: qty 100

## 2023-11-26 NOTE — ED Notes (Signed)
ED TO INPATIENT HANDOFF REPORT  Name/Age/Gender Tammy Cordova 57 y.o. female  Code Status    Code Status Orders  (From admission, onward)           Start     Ordered   11/26/23 0008  Full code  Continuous       Question:  By:  Answer:  Consent: discussion documented in EHR   11/26/23 0008           Code Status History     Date Active Date Inactive Code Status Order ID Comments User Context   07/25/2019 0101 07/30/2019 1654 Full Code [id number]  John Giovanni, MD ED       Home/SNF/Other Home  Chief Complaint Lung mass [R91.8]  Level of Care/Admitting Diagnosis ED Disposition     ED Disposition  Admit   Condition  --   Comment  Hospital Area: [facility] [[id number]]  Level of Care: Telemetry [5]  Admit to tele based on following criteria: Other see comments  Comments: rate  May admit patient to [facility] or [facility] if equivalent level of care is available:: No  Covid Evaluation: Asymptomatic - no recent exposure (last 10 days) testing not required  Diagnosis: Lung mass [208903]  Admitting Physician: Anselm Jungling [[id number]]  Attending Physician: Anselm Jungling [[id number]]  Certification:: I certify this patient will need inpatient services for at least 2 midnights  Expected Medical Readiness: 11/28/2023          Medical History Past Medical History:  Diagnosis Date   COPD (chronic obstructive pulmonary disease) (HCC)    Pneumonia     Allergies No Known Allergies  IV Location/Drains/Wounds Patient Lines/Drains/Airways Status     Active Line/Drains/Airways     Name Placement date Placement time Site Days   Peripheral IV 11/25/23 20 G Left Antecubital 11/25/23  1319  Antecubital  1            Labs/Imaging Results for orders placed or performed during the hospital encounter of 11/25/23 (from the past 48 hours)  I-stat chem 8, ED (not at [facility], [facility] or [facility])     Status: Abnormal   Collection Time: 11/25/23  1:19 PM   Result Value Ref Range   Sodium 139 135 - 145 mmol/L   Potassium 3.9 3.5 - 5.1 mmol/L   Chloride 103 98 - 111 mmol/L   BUN 27 (H) 6 - 20 mg/dL   Creatinine, Ser 7.25 (H) 0.44 - 1.00 mg/dL   Glucose, Bld 366 (H) 70 - 99 mg/dL    Comment: Glucose reference range applies only to samples taken after fasting for at least 8 hours.   Calcium, Ion 1.13 (L) 1.15 - 1.40 mmol/L   TCO2 26 22 - 32 mmol/L   Hemoglobin 14.3 12.0 - 15.0 g/dL   HCT 44.0 34.7 - 42.5 %   MR THORACIC SPINE W WO CONTRAST Result Date: 11/25/2023 CLINICAL DATA:  Metastatic disease evaluation EXAM: MRI THORACIC WITHOUT AND WITH CONTRAST TECHNIQUE: Multiplanar and multiecho pulse sequences of the thoracic spine were obtained without and with intravenous contrast. CONTRAST:  7mL GADAVIST GADOBUTROL 1 MMOL/ML IV SOLN COMPARISON:  No prior MRI of the thoracic spine available, correlation is made with CT chest 11/25/2023 FINDINGS: Alignment: No listhesis. Preservation of the normal thoracic kyphosis. Vertebrae: No acute fracture, evidence of discitis, or suspicious osseous lesion. No abnormal enhancement in the osseous structures. Cord:  Normal signal and morphology.  No abnormal enhancement. Paraspinal and other  soft tissues: Enhancing mass in the right lung, invading the mediastinum, which was better evaluated on the 11/25/2023 CT chest. This mass is adjacent to the anterior right aspect of the C7-T4 vertebral bodies, but the cortical signal appears preserved, and no definite osseous invasion is seen. The mass appears to invade the right neural foramen at T1-T2 (series 24, images 4-5). Disc levels: No significant spinal canal stenosis or osseous neural foraminal narrowing. As described above, the enhancing mass appears to invade the right neural foramen at T1-T2, and may abut the exiting right T1 nerve roots (series 18, image 6). IMPRESSION: 1. Enhancing mass in the right lung, invading the mediastinum, which was better evaluated on the  11/25/2023 CT chest. This mass is adjacent to the anterior right aspect of the C7-T4 vertebral bodies, but the cortical signal appears preserved, and no definite osseous invasion is seen. The mass appears to invade the right neural foramen at T1-T2, and may abut the exiting right T1 nerve roots. 2. No evidence of metastatic disease in the thoracic spine. Electronically Signed   By: Wiliam Ke M.D.   On: 11/25/2023 20:52   CT CHEST WO CONTRAST Result Date: 11/25/2023 CLINICAL DATA:  Mediastinal mass EXAM: CT CHEST WITHOUT CONTRAST TECHNIQUE: Multidetector CT imaging of the chest was performed following the standard protocol without IV contrast. RADIATION DOSE REDUCTION: This exam was performed according to the departmental dose-optimization program which includes automated exposure control, adjustment of the mA and/or kV according to patient size and/or use of iterative reconstruction technique. COMPARISON:  CT neck 11/25/2023 FINDINGS: Cardiovascular: Atheromatous vascular calcification in the aortic arch and left anterior descending coronary artery. Moderate cardiomegaly. Mediastinum/Nodes: Right lower neck level V lymph node measures 2.4 cm in short axis on image 30 series 2. Right supraclavicular node 0.9 cm in short axis on image 6 series 2. Index left axillary node 1.2 cm in short axis on image 11 series 2. Index right axillary node 1.2 cm in short axis on image 16 series 2. There is a right upper lobe mass invading the mediastinum and displacing the trachea and esophagus to the left. 1.5 cm hypodense left thyroid lobe lesion on image 6 series 2. Lungs/Pleura: Presumed right upper lobe lung mass invading the mediastinum measures 6.6 by 4.5 cm on image 11 series 2, and displaces the trachea and esophagus to the left. Adjacent right suprahilar nodularity including a 3.1 by 1.8 cm tangential mass on image 43 series 4. The B1A apical segmental bronchus is occluded. Mild atelectasis along both diaphragms.  Centrilobular emphysema. Mild airspace opacity posteriorly in the right upper lobe on image 64 series 4, probably from atelectasis. Upper Abdomen: Unremarkable Musculoskeletal: Unremarkable IMPRESSION: 1. Presumed right upper lobe lung mass invading the mediastinum and displacing the trachea and esophagus to the left, measuring 6.6 cm in long axis. Adjacent right suprahilar nodularity including a 3.1 by 1.8 cm tangential mass. The B1A apical segmental bronchus is occluded. 2. Right lower neck level V lymph node measures 2.4 cm in short axis. Right supraclavicular node 0.9 cm in short axis. Index bilateral axillary nodes are mildly enlarged. Findings are compatible with metastatic adenopathy. 3. 1.5 cm hypodense left thyroid lobe lesion. Recommend thyroid US (ref: J Am Coll Radiol. 2015 Feb;12(2): 143-50). 4. Moderate cardiomegaly. 5. Aortic and coronary atherosclerosis. Aortic Atherosclerosis (ICD10-I70.0) and Emphysema (ICD10-J43.9). Electronically Signed   By: Gaylyn Rong M.D.   On: 11/25/2023 17:07   CT Soft Tissue Neck W Contrast Result Date: 11/25/2023 CLINICAL DATA:  Neck mass, non pulsatile lymph node. Patient reports a palpable knot in the right side his neck. EXAM: CT NECK WITH CONTRAST TECHNIQUE: Multidetector CT imaging of the neck was performed using the standard protocol following the bolus administration of intravenous contrast. RADIATION DOSE REDUCTION: This exam was performed according to the departmental dose-optimization program which includes automated exposure control, adjustment of the mA and/or kV according to patient size and/or use of iterative reconstruction technique. CONTRAST:  60mL OMNIPAQUE IOHEXOL 300 MG/ML  SOLN COMPARISON:  CT cervical spine without contrast of the same day. FINDINGS: Pharynx and larynx: Mild adenoid hypertrophy is noted. No discrete lesion is present. The oropharynx is within normal limits. Tongue base is. Vallecula and epiglottis are within normal limits.  Aryepiglottic folds and piriform sinuses are clear. Vocal cords are midline and symmetric. Trachea is clear. Salivary glands: The submandibular and parotid glands and ducts are within normal limits. Thyroid: Heterogeneous nodule the inferior left lobe measures 2.2 x 2.2 cm on sagittal images. Lymph nodes: A right supraclavicular nodal mass measures 3.6 x 2.4 x 3.7 cm. A right level 4 heterogeneous node measures 15 mm. No significant upper cervical adenopathy is present. A heterogeneous right paraspinous mass begins at the level of C6-7 and extends into the mediastinum. Tumor surrounds the proximal first rib. Tumor extends into the right T1-2 foramen. No discrete osseous destruction is present. Vascular: No significant vascular calcifications are present. Limited intracranial: Within normal limits. Visualized orbits: The globes and orbits are within normal limits. Mastoids and visualized paranasal sinuses: The paranasal sinuses and mastoid air cells are clear. Skeleton: Multilevel degenerative changes are present in the cervical spine. Uncovertebral spurring contributes to right foraminal stenosis at C6-7. No focal osseous lesions are present. Upper chest: Extensive centrilobular emphysematous changes are present. IMPRESSION: 1. Heterogeneous right paraspinous mass begins at the level of C6-7 and extends into the mediastinum. Tumor surrounds the proximal first rib. Tumor extends into the right T1-2 foramen. This is most concerning for a lung primary neoplasm. Recommend CT of the chest with and without contrast as well as MRI of the thoracic spine without and with contrast 2. Right supraclavicular nodal mass measures 3.6 x 2.4 x 3.7 cm, consistent with metastatic disease. 3. Right level 4 heterogeneous node measures 15 mm. 4. Heterogeneous nodule the inferior left lobe measures 2.2 x 2.2 cm on sagittal images. Recommend non-emergent thyroid ultrasound. Reference: J Am Coll Radiol. 2015 Feb;12(2): 143-50 5. Multilevel  degenerative changes in the cervical spine. 6.  Emphysema (ICD10-J43.9). Electronically Signed   By: Marin Roberts M.D.   On: 11/25/2023 16:19   CT Cervical Spine Wo Contrast Result Date: 11/25/2023 CLINICAL DATA:  Cervical radiculopathy, infection suspected, no prior imaging EXAM: CT CERVICAL SPINE WITHOUT CONTRAST TECHNIQUE: Multidetector CT imaging of the cervical spine was performed without intravenous contrast. Multiplanar CT image reconstructions were also generated. RADIATION DOSE REDUCTION: This exam was performed according to the departmental dose-optimization program which includes automated exposure control, adjustment of the mA and/or kV according to patient size and/or use of iterative reconstruction technique. COMPARISON:  None Available. FINDINGS: Alignment: Facet joints are aligned without dislocation or traumatic listhesis. Dens and lateral masses are aligned. Skull base and vertebrae: No acute fracture of the cervical spine. Heterogeneity of the osseous structures although no well-defined lytic or sclerotic bone lesion. Soft tissues and spinal canal: No prevertebral fluid or swelling. No visible canal hematoma. Disc levels: Mild degenerative endplate spurring. Slight disc height loss at C6-7. Minimal facet joint spurring. Upper  chest: Right supraclavicular mass and partially imaged superior mediastinal mass. 1.7 cm left thyroid lobe nodule. Other: None. IMPRESSION: 1. No acute fracture or traumatic listhesis of the cervical spine. 2. Right supraclavicular mass and partially imaged superior mediastinal mass. Attention on forthcoming CT of the neck. Contrast enhanced CT of the chest is recommended to more fully assess the mediastinal mass. 3. 1.7 cm left thyroid lobe nodule. Recommend nonemergent thyroid US (ref: J Am Coll Radiol. 2015 Feb;12(2): 143-50). Electronically Signed   By: Duanne Guess D.O.   On: 11/25/2023 15:32    Pending Labs Unresulted Labs (From admission, onward)      Start     Ordered   11/26/23 0500  CBC  Tomorrow morning,   R        11/26/23 0008   11/26/23 0500  Basic metabolic panel  Tomorrow morning,   R        11/26/23 0008   11/26/23 0500  HIV Antibody (routine testing w rflx)  (HIV Antibody (Routine testing w reflex) panel)  Once,   R        11/26/23 0040            Vitals/Pain Today's Vitals   11/25/23 2015 11/25/23 2133 11/25/23 2230 11/26/23 0000  BP: 113/70 134/74 126/76   Pulse: 86 82 82   Resp: 17 18 17 18   Temp:  98.1 F (36.7 C)  98.3 F (36.8 C)  TempSrc:  Oral  Oral  SpO2: 94% 90% 94%   Weight:      Height:      PainSc:        Isolation Precautions No active isolations  Medications Medications  oxyCODONE (Oxy IR/ROXICODONE) immediate release tablet 5 mg (has no administration in time range)  morphine (PF) 2 MG/ML injection 1 mg (has no administration in time range)  ondansetron (ZOFRAN) injection 4 mg (has no administration in time range)  acetaminophen (TYLENOL) tablet 1,000 mg (1,000 mg Oral Given 11/25/23 1312)  oxyCODONE (Oxy IR/ROXICODONE) immediate release tablet 5 mg (5 mg Oral Given 11/25/23 1312)  iohexol (OMNIPAQUE) 300 MG/ML solution 60 mL (60 mLs Intravenous Contrast Given 11/25/23 1411)  sodium chloride 0.9 % bolus 1,000 mL (0 mLs Intravenous Stopped 11/25/23 1801)  LORazepam (ATIVAN) injection 1 mg (1 mg Intravenous Given 11/25/23 1819)  gadobutrol (GADAVIST) 1 MMOL/ML injection 7 mL (7 mLs Intravenous Contrast Given 11/25/23 1859)    Mobility walks with person assist

## 2023-11-26 NOTE — Progress Notes (Signed)
Received request from inpatient MD to schedule follow up for this patient who is currently admitted. She was found to have a lung mass consistent with primary lung cancer. She will be scheduled for a core biopsy inpatient today.   Reviewed the chart with Dr Myna Hidalgo. He will request that the inpatient oncology provider round on the patient to determine if there are more urgent needs.   Scheduled patient for new patient appointment in the office next week, but will follow up to ensure that patient is discharged and call patient to notify them of appointment.   Oncology Nurse Navigator Documentation     11/26/2023   10:45 AM  Oncology Nurse Navigator Flowsheets  Abnormal Finding Date 11/25/2023  Diagnosis Status Additional Work Up  Navigator Follow Up Date: 11/29/2023  Navigator Follow Up Reason: Appointment Review  Navigator Location [facility]  Referral Date to RadOnc/MedOnc 11/26/2023  Navigator Encounter Type Appt/Treatment Plan Review  Patient Visit Type MedOnc  Treatment Phase Abnormal Scans  Barriers/Navigation Needs Coordination of Care  Interventions Coordination of Care  Acuity Level 2-Minimal Needs (1-2 Barriers Identified)  Coordination of Care Appts;Other  Time Spent with Patient 30

## 2023-11-26 NOTE — Assessment & Plan Note (Signed)
-  not in exacerbation -pt stopped tobacco use last week

## 2023-11-26 NOTE — Progress Notes (Signed)
Patient ID: Tammy Cordova, female   DOB: 06/27/1966, 57 y.o.   MRN: [id number] Request received from primary care team for needle biopsy on patient.  Latest imaging studies have been reviewed by Dr. Bryn Gulling.  Plan at this time is for ultrasound-guided right supraclavicular lymph node biopsy later this afternoon.Risks and benefits of procedure was discussed with the patient /spouse  including, but not limited to bleeding, infection, damage to adjacent structures or low yield requiring additional tests.  All of the questions were answered and there is agreement to proceed.  Consent signed and in chart.  Procedure to be done with local anesthesia only.

## 2023-11-26 NOTE — Procedures (Signed)
Interventional Radiology Procedure Note  Procedure: US guided right supraclavicular lymph node biopsy  Indication: Chest mass with right neck lymph adenopathy  Findings: Please refer to procedural dictation for full description.  Complications: None  EBL: < 10 mL  Acquanetta Belling, MD [phone]

## 2023-11-26 NOTE — Progress Notes (Signed)
PROGRESS NOTE    Tammy Cordova  FAO:[id number] DOB: [birth year] DOA: 11/25/2023 PCP: Ollen Bowl, MD    Brief Narrative:  57 year old with history of COPD, smoker presented to the emergency room with about 6 months of right shoulder pain, arm pain.  She does have a history of rheumatoid arthritis and she thought some of this related to that pain.  Patient started having intermittent shooting pain on her right arm so came to the emergency room.  She quit smoking last week. In the emergency room hemodynamically stable.  Imaging studies consistent with right upper lobe lung tumor with infiltration into the ribs/ cervical spine. Admitted with oncology, radiation oncology consultation.  Subjective: Patient seen in the morning rounds with husband at the bedside.  Her pain improved with 1 dose of oxycodone. Discussed case with interventional radiology, she underwent biopsy of the right cervical lesion. Discussed case with oncology, seen in consultation. Assessment & Plan:   Right upper lobe lung tumor with metastasis, invasive lung tumor.  Likely stage IV carcinoma. Status post biopsy today.  Results pending. Underwent CT simulation, patient for radiation treatment is starting next week. MRI of the cervical spine, MRI of the brain, CT scan chest abdomen pelvis today. Seen by oncology, currently symptomatic management, pain relief and follow-up with biopsy results. Dexamethasone, once Zometa 4 mg IV x 1.  Thyroid lesion: 1.5 cm hypodense left thyroid lobe seen.  PET/CT scan to be scheduled as outpatient.  COPD: Currently stable.  Bronchodilator as needed.  Essential hypertension: On Diovan at home.  Blood pressure stable.  Will resume on discharge.  Rheumatoid arthritis: Currently off methotrexate.  She will follow-up with her rheumatologist   DVT prophylaxis: SCDs Start: 11/26/23 0008   Code Status: Full code Family Communication: Husband at the bedside Disposition Plan:  Status is: Inpatient Remains inpatient appropriate because: New diagnosis of malignancy, workup pending     Consultants:  Oncology Radiation oncology  Procedures:  Ultrasound-guided biopsy of right cervical lesion  Antimicrobials:  None     Objective: Vitals:   11/26/23 0000 11/26/23 0207 11/26/23 0558 11/26/23 1007  BP:  135/76 123/67 130/78  Pulse:  89 90 85  Resp: 18 16 (!) 22   Temp: 98.3 F (36.8 C) 98.8 F (37.1 C) 98.9 F (37.2 C) 98.2 F (36.8 C)  TempSrc: Oral Oral Oral Oral  SpO2:  93% (!) 89% 93%  Weight:      Height:        Intake/Output Summary (Last 24 hours) at 11/26/2023 1546 Last data filed at 11/25/2023 1801 Gross per 24 hour  Intake 1000 ml  Output --  Net 1000 ml   Filed Weights   11/25/23 1020  Weight: 70.3 kg    Examination:  General exam: Appears calm and comfortable  Respiratory system: Clear to auscultation. Respiratory effort normal. Cardiovascular system: S1 & S2 heard, RRR.  Gastrointestinal system: Abdomen is nondistended, soft and nontender. No organomegaly or masses felt. Normal bowel sounds heard. Central nervous system: Alert and oriented. No focal neurological deficits. Extremities: Symmetric 5 x 5 power. Skin: No rashes, lesions or ulcers Psychiatry: Judgement and insight appear normal. Mood & affect appropriate.  Patient has palpable lymph nodes and firm lesions right cervical region, supraclavicular region. She does not have any neurovascular compromise.    Data Reviewed: I have personally reviewed following labs and imaging studies  CBC: Recent Labs  Lab 11/25/23 1319 11/26/23 0526  WBC  --  3.8*  HGB 14.3 12.3  HCT 42.0 39.3  MCV  --  88.7  PLT  --  303   Basic Metabolic Panel: Recent Labs  Lab 11/25/23 1319 11/26/23 0526  NA 139 133*  K 3.9 3.8  CL 103 105  CO2  --  22  GLUCOSE 114* 84  BUN 27* 19  CREATININE 1.60* 0.69  CALCIUM  --  8.0*   GFR: Estimated Creatinine Clearance: 73.5 mL/min  (by C-G formula based on SCr of 0.69 mg/dL). Liver Function Tests: No results for input(s): "AST", "ALT", "ALKPHOS", "BILITOT", "PROT", "ALBUMIN" in the last 168 hours. No results for input(s): "LIPASE", "AMYLASE" in the last 168 hours. No results for input(s): "AMMONIA" in the last 168 hours. Coagulation Profile: Recent Labs  Lab 11/26/23 1112  INR 1.0   Cardiac Enzymes: No results for input(s): "CKTOTAL", "CKMB", "CKMBINDEX", "TROPONINI" in the last 168 hours. BNP (last 3 results) No results for input(s): "PROBNP" in the last 8760 hours. HbA1C: No results for input(s): "HGBA1C" in the last 72 hours. CBG: No results for input(s): "GLUCAP" in the last 168 hours. Lipid Profile: No results for input(s): "CHOL", "HDL", "LDLCALC", "TRIG", "CHOLHDL", "LDLDIRECT" in the last 72 hours. Thyroid Function Tests: No results for input(s): "TSH", "T4TOTAL", "FREET4", "T3FREE", "THYROIDAB" in the last 72 hours. Anemia Panel: No results for input(s): "VITAMINB12", "FOLATE", "FERRITIN", "TIBC", "IRON", "RETICCTPCT" in the last 72 hours. Sepsis Labs: No results for input(s): "PROCALCITON", "LATICACIDVEN" in the last 168 hours.  No results found for this or any previous visit (from the past 240 hours).       Radiology Studies: MR THORACIC SPINE W WO CONTRAST Result Date: 11/25/2023 CLINICAL DATA:  Metastatic disease evaluation EXAM: MRI THORACIC WITHOUT AND WITH CONTRAST TECHNIQUE: Multiplanar and multiecho pulse sequences of the thoracic spine were obtained without and with intravenous contrast. CONTRAST:  7mL GADAVIST GADOBUTROL 1 MMOL/ML IV SOLN COMPARISON:  No prior MRI of the thoracic spine available, correlation is made with CT chest 11/25/2023 FINDINGS: Alignment: No listhesis. Preservation of the normal thoracic kyphosis. Vertebrae: No acute fracture, evidence of discitis, or suspicious osseous lesion. No abnormal enhancement in the osseous structures. Cord:  Normal signal and morphology.   No abnormal enhancement. Paraspinal and other soft tissues: Enhancing mass in the right lung, invading the mediastinum, which was better evaluated on the 11/25/2023 CT chest. This mass is adjacent to the anterior right aspect of the C7-T4 vertebral bodies, but the cortical signal appears preserved, and no definite osseous invasion is seen. The mass appears to invade the right neural foramen at T1-T2 (series 24, images 4-5). Disc levels: No significant spinal canal stenosis or osseous neural foraminal narrowing. As described above, the enhancing mass appears to invade the right neural foramen at T1-T2, and may abut the exiting right T1 nerve roots (series 18, image 6). IMPRESSION: 1. Enhancing mass in the right lung, invading the mediastinum, which was better evaluated on the 11/25/2023 CT chest. This mass is adjacent to the anterior right aspect of the C7-T4 vertebral bodies, but the cortical signal appears preserved, and no definite osseous invasion is seen. The mass appears to invade the right neural foramen at T1-T2, and may abut the exiting right T1 nerve roots. 2. No evidence of metastatic disease in the thoracic spine. Electronically Signed   By: Wiliam Ke M.D.   On: 11/25/2023 20:52   CT CHEST WO CONTRAST Result Date: 11/25/2023 CLINICAL DATA:  Mediastinal mass EXAM: CT CHEST WITHOUT CONTRAST TECHNIQUE: Multidetector CT imaging of the chest was performed  following the standard protocol without IV contrast. RADIATION DOSE REDUCTION: This exam was performed according to the departmental dose-optimization program which includes automated exposure control, adjustment of the mA and/or kV according to patient size and/or use of iterative reconstruction technique. COMPARISON:  CT neck 11/25/2023 FINDINGS: Cardiovascular: Atheromatous vascular calcification in the aortic arch and left anterior descending coronary artery. Moderate cardiomegaly. Mediastinum/Nodes: Right lower neck level V lymph node measures  2.4 cm in short axis on image 30 series 2. Right supraclavicular node 0.9 cm in short axis on image 6 series 2. Index left axillary node 1.2 cm in short axis on image 11 series 2. Index right axillary node 1.2 cm in short axis on image 16 series 2. There is a right upper lobe mass invading the mediastinum and displacing the trachea and esophagus to the left. 1.5 cm hypodense left thyroid lobe lesion on image 6 series 2. Lungs/Pleura: Presumed right upper lobe lung mass invading the mediastinum measures 6.6 by 4.5 cm on image 11 series 2, and displaces the trachea and esophagus to the left. Adjacent right suprahilar nodularity including a 3.1 by 1.8 cm tangential mass on image 43 series 4. The B1A apical segmental bronchus is occluded. Mild atelectasis along both diaphragms. Centrilobular emphysema. Mild airspace opacity posteriorly in the right upper lobe on image 64 series 4, probably from atelectasis. Upper Abdomen: Unremarkable Musculoskeletal: Unremarkable IMPRESSION: 1. Presumed right upper lobe lung mass invading the mediastinum and displacing the trachea and esophagus to the left, measuring 6.6 cm in long axis. Adjacent right suprahilar nodularity including a 3.1 by 1.8 cm tangential mass. The B1A apical segmental bronchus is occluded. 2. Right lower neck level V lymph node measures 2.4 cm in short axis. Right supraclavicular node 0.9 cm in short axis. Index bilateral axillary nodes are mildly enlarged. Findings are compatible with metastatic adenopathy. 3. 1.5 cm hypodense left thyroid lobe lesion. Recommend thyroid US (ref: J Am Coll Radiol. 2015 Feb;12(2): 143-50). 4. Moderate cardiomegaly. 5. Aortic and coronary atherosclerosis. Aortic Atherosclerosis (ICD10-I70.0) and Emphysema (ICD10-J43.9). Electronically Signed   By: Gaylyn Rong M.D.   On: 11/25/2023 17:07   CT Soft Tissue Neck W Contrast Result Date: 11/25/2023 CLINICAL DATA:  Neck mass, non pulsatile lymph node. Patient reports a palpable  knot in the right side his neck. EXAM: CT NECK WITH CONTRAST TECHNIQUE: Multidetector CT imaging of the neck was performed using the standard protocol following the bolus administration of intravenous contrast. RADIATION DOSE REDUCTION: This exam was performed according to the departmental dose-optimization program which includes automated exposure control, adjustment of the mA and/or kV according to patient size and/or use of iterative reconstruction technique. CONTRAST:  60mL OMNIPAQUE IOHEXOL 300 MG/ML  SOLN COMPARISON:  CT cervical spine without contrast of the same day. FINDINGS: Pharynx and larynx: Mild adenoid hypertrophy is noted. No discrete lesion is present. The oropharynx is within normal limits. Tongue base is. Vallecula and epiglottis are within normal limits. Aryepiglottic folds and piriform sinuses are clear. Vocal cords are midline and symmetric. Trachea is clear. Salivary glands: The submandibular and parotid glands and ducts are within normal limits. Thyroid: Heterogeneous nodule the inferior left lobe measures 2.2 x 2.2 cm on sagittal images. Lymph nodes: A right supraclavicular nodal mass measures 3.6 x 2.4 x 3.7 cm. A right level 4 heterogeneous node measures 15 mm. No significant upper cervical adenopathy is present. A heterogeneous right paraspinous mass begins at the level of C6-7 and extends into the mediastinum. Tumor surrounds the proximal first  rib. Tumor extends into the right T1-2 foramen. No discrete osseous destruction is present. Vascular: No significant vascular calcifications are present. Limited intracranial: Within normal limits. Visualized orbits: The globes and orbits are within normal limits. Mastoids and visualized paranasal sinuses: The paranasal sinuses and mastoid air cells are clear. Skeleton: Multilevel degenerative changes are present in the cervical spine. Uncovertebral spurring contributes to right foraminal stenosis at C6-7. No focal osseous lesions are present.  Upper chest: Extensive centrilobular emphysematous changes are present. IMPRESSION: 1. Heterogeneous right paraspinous mass begins at the level of C6-7 and extends into the mediastinum. Tumor surrounds the proximal first rib. Tumor extends into the right T1-2 foramen. This is most concerning for a lung primary neoplasm. Recommend CT of the chest with and without contrast as well as MRI of the thoracic spine without and with contrast 2. Right supraclavicular nodal mass measures 3.6 x 2.4 x 3.7 cm, consistent with metastatic disease. 3. Right level 4 heterogeneous node measures 15 mm. 4. Heterogeneous nodule the inferior left lobe measures 2.2 x 2.2 cm on sagittal images. Recommend non-emergent thyroid ultrasound. Reference: J Am Coll Radiol. 2015 Feb;12(2): 143-50 5. Multilevel degenerative changes in the cervical spine. 6.  Emphysema (ICD10-J43.9). Electronically Signed   By: Marin Roberts M.D.   On: 11/25/2023 16:19   CT Cervical Spine Wo Contrast Result Date: 11/25/2023 CLINICAL DATA:  Cervical radiculopathy, infection suspected, no prior imaging EXAM: CT CERVICAL SPINE WITHOUT CONTRAST TECHNIQUE: Multidetector CT imaging of the cervical spine was performed without intravenous contrast. Multiplanar CT image reconstructions were also generated. RADIATION DOSE REDUCTION: This exam was performed according to the departmental dose-optimization program which includes automated exposure control, adjustment of the mA and/or kV according to patient size and/or use of iterative reconstruction technique. COMPARISON:  None Available. FINDINGS: Alignment: Facet joints are aligned without dislocation or traumatic listhesis. Dens and lateral masses are aligned. Skull base and vertebrae: No acute fracture of the cervical spine. Heterogeneity of the osseous structures although no well-defined lytic or sclerotic bone lesion. Soft tissues and spinal canal: No prevertebral fluid or swelling. No visible canal hematoma.  Disc levels: Mild degenerative endplate spurring. Slight disc height loss at C6-7. Minimal facet joint spurring. Upper chest: Right supraclavicular mass and partially imaged superior mediastinal mass. 1.7 cm left thyroid lobe nodule. Other: None. IMPRESSION: 1. No acute fracture or traumatic listhesis of the cervical spine. 2. Right supraclavicular mass and partially imaged superior mediastinal mass. Attention on forthcoming CT of the neck. Contrast enhanced CT of the chest is recommended to more fully assess the mediastinal mass. 3. 1.7 cm left thyroid lobe nodule. Recommend nonemergent thyroid US (ref: J Am Coll Radiol. 2015 Feb;12(2): 143-50). Electronically Signed   By: Duanne Guess D.O.   On: 11/25/2023 15:32        Scheduled Meds:  dexamethasone  20 mg Intravenous Once   pantoprazole  40 mg Oral BID   Continuous Infusions:  zoledronic acid (ZOMETA) 4 mg in sodium chloride 0.9 % 100 mL IVPB       LOS: 1 day    Time spent: 35 minutes    Dorcas Carrow, MD [facility]

## 2023-11-26 NOTE — Consult Note (Addendum)
[facility] CONSULT NOTE  Patient Care Team: Ollen Bowl, MD as PCP - General (Internal Medicine) [name], MD as Consulting Physician (Oncology) Gwendel Hanson, RN as Oncology Nurse Navigator  CHIEF COMPLAINTS/PURPOSE OF CONSULTATION:  Newly diagnosed right upper lobe lung mass  REFERRING PHYSICIAN: Dr. Wallace Cullens  HISTORY OF PRESENTING ILLNESS:  Tammy Cordova 57 y.o. female who came into the ED complaining of right and arm pain for the last approximately 6 months.  Reports that the arm pain will be so bad that it would wake her up sometimes at night, very often 10 out of 10 pain.  Patient apparently had an axillary lymph node removed a few years ago as part of breast workup and she thought it may have been related to that. Work up was done in the ED which included a CT scan scans which showed a right upper lobe lung mass.  Oncology is now consulted for further treatment and management. Patient assessed and examined today.  Upon exam, palpable right cervical lymph node and bilateral axillary lymph nodes palpated.  Patient is awake alert and oriented x 3.  Admits to being very nervous over this diagnosis.  Spouse at bedside.  Past medical history is significant for hypertension, RA, lupus.  Patient states she was prescribed methotrexate but has not taken it in over 1 year. Surgical history is noncontributory Family history significant for paternal grandmother with breast cancer, and her father with prostate cancer. Social history is significant for 14-year history of tobacco, 10 cigarettes/day from the age of 81 and states she quit 1 week ago.  Denies alcohol use.  Admits to marijuana use, preferably edibles daily.   I have reviewed her chart and materials related to her cancer extensively and collaborated history with the patient. Summary of oncologic history is as follows: Oncology History   No history exists.    ASSESSMENT & PLAN:   1.  Right upper lobe lung  mass - Patient gives approximately 27-month history of cough, weight loss, decreased appetite. - CT scan of chest and neck done 11/25/2023 shows right upper lobe lung mass 6.6 cm invading mediastinum and displacing the trachea and esophagus to the left.  There are multiple lymph nodes in the cervical, supraclavicular, and axillary areas. - Findings are compatible with metastatic adenopathy. - MRI also done which shows enhancing mass in the right lung invading the mediastinum.  It shows no evidence of mets in the thoracic spine. -Indeed the cervical and axillary lymph nodes are palpable. -Diagnostic testing and findings are concerning for a primary lung neoplasm with mets.  Patient will need biopsy for definitive diagnosis. - Medical oncology/Dr. Myna Hidalgo will follow patient very closely.  All treatment recommendations will be forthcoming from Dr. Myna Hidalgo.  2.  Left thyroid lobe lesion - 1.5 cm hypodense left thyroid lobe lesion seen on CT scan of chest done 11/25/2023. - CT of cervical spine shows 1 point centimeter left thyroid lobe nodule. - Nonemergent thyroid ultrasound recommended  3.  History of RA - Patient reports she has been on methotrexate in the past.  Admits to not taking it for the last year. - Patient to follow with rheumatologist at appropriate time.  4.  Tobacco use disorder - 40-year history of smoking cigarettes 10/day since age 26.  States she quit 1 week ago. - Encourage tobacco cessation   Orders Placed This Encounter  Procedures   CT Soft Tissue Neck W Contrast    Standing Status:  Standing    Number of Occurrences:   1    Does the patient have a contrast media/X-ray dye allergy?:   Yes    If indicated for the ordered procedure, I authorize the administration of contrast media per Radiology protocol:   Yes    Is patient pregnant?:   No    Radiology Contrast Protocol - do NOT remove file path:   [email or web address]   CT  Cervical Spine Wo Contrast    Standing Status:   Standing    Number of Occurrences:   1    Is patient pregnant?:   No   CT CHEST WO CONTRAST    Standing Status:   Standing    Number of Occurrences:   1   MR THORACIC SPINE W WO CONTRAST    Standing Status:   Standing    Number of Occurrences:   1    If indicated for the ordered procedure, I authorize the administration of contrast media per Radiology protocol:   Yes    What is the patient's sedation requirement?:   No Sedation    Does the patient have a pacemaker or implanted devices?:   No   [place] CORE BIOPSY (LYMPH NODES)    Standing Status:   Standing    Number of Occurrences:   1    Lab orders requested (DO NOT place separate lab orders, these will be automatically ordered during procedure specimen collection)::   Surgical Pathology    Can the patient sign their own consent?:   Yes    Symptom/Reason for Exam:   Malignancy (HCC) [225706]   CBC    Standing Status:   Standing    Number of Occurrences:   1   Basic metabolic panel    Standing Status:   Standing    Number of Occurrences:   1   HIV Antibody (routine testing w rflx)    Standing Status:   Standing    Number of Occurrences:   1   Protime-INR    Standing Status:   Standing    Number of Occurrences:   1   Diet regular Room service appropriate? Yes; Fluid consistency: Thin    Standing Status:   Standing    Number of Occurrences:   1    Room service appropriate?:   Yes    Fluid consistency::   Thin   Cardiac Monitoring Continuous x 24 hours Indications for use: Other; other indications for use: rate    Standing Status:   Standing    Number of Occurrences:   1    Indications for use::   Other    other indications for use::   rate   Vital signs    Standing Status:   Standing    Number of Occurrences:   1   Notify physician (specify)    Standing Status:   Standing    Number of Occurrences:   20    Notify Physician:   for pulse less than 55 or greater than 120    Notify  Physician:   for respiratory rate less than 12 or greater than 25    Notify Physician:   for temperature greater than 100.5 F    Notify Physician:   for urinary output less than 30 mL/hr for four hours    Notify Physician:   for systolic BP less than 90 or greater than 160, diastolic BP less than 60 or greater than 100    Notify Physician:  for new hypoxia w/ oxygen saturations < 88%   Mobility Protocol: No Restrictions    RN to initiate protocols based on patient's level of care    Standing Status:   Standing    Number of Occurrences:   1   Refer to Sidebar Report Refer to ICU, Med-Surg, Progressive, and Step-Down Mobility Protocol Sidebars    Refer to ICU, Med-Surg, Progressive, and Step-Down Mobility Protocol Sidebars    Standing Status:   Standing    Number of Occurrences:   1   Do not place and if present remove PureWick    Standing Status:   Standing    Number of Occurrences:   1   Initiate Oral Care Protocol    Standing Status:   Standing    Number of Occurrences:   1   Initiate Carrier Fluid Protocol    Standing Status:   Standing    Number of Occurrences:   1   RN may order General Admission PRN Orders utilizing "General Admission PRN medications" (through manage orders) for the following patient needs: allergy symptoms (Claritin), cold sores (Carmex), cough (Robitussin DM), eye irritation (Liquifilm Tears), hemorrhoids (Tucks), indigestion (Maalox), minor skin irritation (Hydrocortisone Cream), muscle pain Romeo Apple Gay), nose irritation (saline nasal spray) and sore throat (Chloraseptic spray).    Standing Status:   Standing    Number of Occurrences:   [phone]   SCDs    Standing Status:   Standing    Number of Occurrences:   1    Laterality:   Bilateral   Patient has an active order for admit to inpatient/place in observation    Standing Status:   Standing    Number of Occurrences:   1   Complete oral care assessment tool on admission, transfer, and q shift    Standing Status:    Standing    Number of Occurrences:   1   Refer to Sidebar Report Adult Oral Care Protocol    Adult Oral Care Protocol    Standing Status:   Standing    Number of Occurrences:   1   Brush teeth with toothbrush and toothpaste 3 times daily    Standing Status:   Standing    Number of Occurrences:   1   Full code    Standing Status:   Standing    Number of Occurrences:   1    By::   Consent: discussion documented in EHR   Consult to oncology    Standing Status:   Standing    Number of Occurrences:   1    Place call to::   oncall Oncology    Reason for Consult:   Consult   Consult to hospitalist    Standing Status:   Standing    Number of Occurrences:   1    Place call to::   [facility]    Reason for Consult:   Admit   Consult to Transition of Care    Pt reports difficulty affording some of her current medications    Standing Status:   Standing    Number of Occurrences:   1    Reason for Consult::   Medication Assistance   Oxygen therapy Mode or (Route): Nasal cannula; Liters Per Minute: 2; Keep O2 saturation between: greater than 92 %    Standing Status:   Standing    Number of Occurrences:   1    Mode or (Route):   Nasal cannula    Liters  Per Minute:   2    Keep O2 saturation between:   greater than 92 %   I-stat chem 8, ED (not at [facility], [facility] or [facility])    Standing Status:   Standing    Number of Occurrences:   1   ED EKG    Standing Status:   Standing    Number of Occurrences:   1    Reason for Exam:   Chest Pain   Admit to Inpatient (patient's expected length of stay will be greater than 2 midnights or inpatient only procedure)    Standing Status:   Standing    Number of Occurrences:   1    Hospital Area:   [facility] [[id number]]    Level of Care:   Telemetry [5]    Admit to tele based on following criteria:   Other see comments    Comments:   rate    May admit patient to [facility] or [facility] if equivalent level of care is available::   No     Covid Evaluation:   Asymptomatic - no recent exposure (last 10 days) testing not required    Diagnosis:   Lung mass [208903]    Admitting Physician:   Anselm Jungling [[id number]]    Attending Physician:   Anselm Jungling [[id number]]    Certification::   I certify this patient will need inpatient services for at least 2 midnights    Expected Medical Readiness:   11/28/2023     MEDICAL HISTORY:  Past Medical History:  Diagnosis Date   COPD (chronic obstructive pulmonary disease) (HCC)    Pneumonia     SURGICAL HISTORY: Past Surgical History:  Procedure Laterality Date   BREAST BIOPSY Left 04/2016   REACTIVE LYMPHOID HYPERPLASIA    BUNIONECTOMY     bilateral    SOCIAL HISTORY: Social History   Socioeconomic History   Marital status: Married    Spouse name: Not on file   Number of children: Not on file   Years of education: Not on file   Highest education level: Not on file  Occupational History   Not on file  Tobacco Use   Smoking status: Every Day    Current packs/day: 0.30    Average packs/day: 0.3 packs/day for 36.0 years (10.8 ttl pk-yrs)    Types: Cigarettes   Smokeless tobacco: Never   Tobacco comments:    6 cigarettes smoked daily. 09/26/20 [facility]   Vaping Use   Vaping status: Never Used  Substance and Sexual Activity   Alcohol use: No   Drug use: Yes    Types: Marijuana   Sexual activity: Not on file  Other Topics Concern   Not on file  Social History Narrative   Not on file   Social Drivers of Health   Financial Resource Strain: Not on file  Food Insecurity: No Food Insecurity (11/26/2023)   Hunger Vital Sign    Worried About Running Out of Food in the Last Year: Never true    Ran Out of Food in the Last Year: Never true  Transportation Needs: No Transportation Needs (11/26/2023)   PRAPARE - Administrator, Civil Service (Medical): No    Lack of Transportation (Non-Medical): No  Physical Activity: Not on file  Stress: Not on file  Social  Connections: Unknown (04/13/2022)   [facility], [facility]   Social Network    Social Network: Not on file  Intimate  Partner Violence: Not At Risk (11/26/2023)   Humiliation, Afraid, Rape, and Kick questionnaire    Fear of Current or Ex-Partner: No    Emotionally Abused: No    Physically Abused: No    Sexually Abused: No    FAMILY HISTORY: Family History  Problem Relation Age of Onset   Breast cancer Paternal Grandmother 2   Diabetes Mother    Hypertension Mother    Diabetes Father    Hypertension Father    Cancer Father     REVIEW OF SYSTEMS:   Constitutional: Denies fevers, chills or abnormal night sweats Eyes: Denies blurriness of vision, double vision or watery eyes Ears, nose, mouth, throat, and face: Denies mucositis or sore throat Respiratory: Denies cough, dyspnea or wheezes Cardiovascular: Denies palpitation, chest discomfort or lower extremity swelling Gastrointestinal: Denies nausea, heartburn or change in bowel habits Skin: Denies abnormal skin rashes Lymphatics: + Cervical new lymphadenopathy  Musculoskeletal: + RUE and shoulder pain Neurological: Denies numbness, tingling or new weaknesses Behavioral/Psych: Mood is stable, no new changes  All other systems were reviewed with the patient and are negative.  PHYSICAL EXAMINATION: ECOG PERFORMANCE STATUS: 1 - Symptomatic but completely ambulatory  Vitals:   11/26/23 0558 11/26/23 1007  BP: 123/67 130/78  Pulse: 90 85  Resp: (!) 22   Temp: 98.9 F (37.2 C) 98.2 F (36.8 C)  SpO2: (!) 89% 93%   Filed Weights   11/25/23 1020  Weight: 155 lb (70.3 kg)    GENERAL: alert, no distress and comfortable SKIN: skin color, texture, turgor are normal, no rashes or significant lesions EYES: normal, conjunctiva are pink and non-injected, sclera clear OROPHARYNX: no exudate, no erythema and lips, buccal mucosa, and tongue normal  NECK: supple, thyroid normal size, non-tender, without  nodularity LYMPH: +Right cervical, +bilateral axillary palpable lymphadenopathy.  Negative in the inguinal areas LUNGS: Plus diminished right side to auscultation  HEART: regular rate & rhythm and no murmurs and no lower extremity edema ABDOMEN: abdomen soft, non-tender and normal bowel sounds MUSCULOSKELETAL: no cyanosis of digits and no clubbing  PSYCH: alert & oriented x 3 with fluent speech NEURO: no focal motor/sensory deficits   ALLERGIES:  has no known allergies.  MEDICATIONS:  Current Facility-Administered Medications  Medication Dose Route Frequency Provider Last Rate Last Admin   morphine (PF) 2 MG/ML injection 1 mg  1 mg Intravenous Q3H PRN Tu, Ching T, DO       ondansetron (ZOFRAN) injection 4 mg  4 mg Intravenous Q6H PRN Tu, Ching T, DO       oxyCODONE (Oxy IR/ROXICODONE) immediate release tablet 5 mg  5 mg Oral Q4H PRN Hillary Bow, DO         LABORATORY DATA:  I have reviewed the data as listed Lab Results  Component Value Date   WBC 3.8 (L) 11/26/2023   HGB 12.3 11/26/2023   HCT 39.3 11/26/2023   MCV 88.7 11/26/2023   PLT 303 11/26/2023   Recent Labs    11/25/23 1319 11/26/23 0526  NA 139 133*  K 3.9 3.8  CL 103 105  CO2  --  22  GLUCOSE 114* 84  BUN 27* 19  CREATININE 1.60* 0.69  CALCIUM  --  8.0*  GFRNONAA  --  >60    RADIOGRAPHIC STUDIES: I have personally reviewed the radiological images as listed and agreed with the findings in the report. MR THORACIC SPINE W WO CONTRAST Result Date: 11/25/2023 CLINICAL DATA:  Metastatic disease evaluation EXAM: MRI THORACIC  WITHOUT AND WITH CONTRAST TECHNIQUE: Multiplanar and multiecho pulse sequences of the thoracic spine were obtained without and with intravenous contrast. CONTRAST:  7mL GADAVIST GADOBUTROL 1 MMOL/ML IV SOLN COMPARISON:  No prior MRI of the thoracic spine available, correlation is made with CT chest 11/25/2023 FINDINGS: Alignment: No listhesis. Preservation of the normal thoracic kyphosis.  Vertebrae: No acute fracture, evidence of discitis, or suspicious osseous lesion. No abnormal enhancement in the osseous structures. Cord:  Normal signal and morphology.  No abnormal enhancement. Paraspinal and other soft tissues: Enhancing mass in the right lung, invading the mediastinum, which was better evaluated on the 11/25/2023 CT chest. This mass is adjacent to the anterior right aspect of the C7-T4 vertebral bodies, but the cortical signal appears preserved, and no definite osseous invasion is seen. The mass appears to invade the right neural foramen at T1-T2 (series 24, images 4-5). Disc levels: No significant spinal canal stenosis or osseous neural foraminal narrowing. As described above, the enhancing mass appears to invade the right neural foramen at T1-T2, and may abut the exiting right T1 nerve roots (series 18, image 6). IMPRESSION: 1. Enhancing mass in the right lung, invading the mediastinum, which was better evaluated on the 11/25/2023 CT chest. This mass is adjacent to the anterior right aspect of the C7-T4 vertebral bodies, but the cortical signal appears preserved, and no definite osseous invasion is seen. The mass appears to invade the right neural foramen at T1-T2, and may abut the exiting right T1 nerve roots. 2. No evidence of metastatic disease in the thoracic spine. Electronically Signed   By: Wiliam Ke M.D.   On: 11/25/2023 20:52   CT CHEST WO CONTRAST Result Date: 11/25/2023 CLINICAL DATA:  Mediastinal mass EXAM: CT CHEST WITHOUT CONTRAST TECHNIQUE: Multidetector CT imaging of the chest was performed following the standard protocol without IV contrast. RADIATION DOSE REDUCTION: This exam was performed according to the departmental dose-optimization program which includes automated exposure control, adjustment of the mA and/or kV according to patient size and/or use of iterative reconstruction technique. COMPARISON:  CT neck 11/25/2023 FINDINGS: Cardiovascular: Atheromatous  vascular calcification in the aortic arch and left anterior descending coronary artery. Moderate cardiomegaly. Mediastinum/Nodes: Right lower neck level V lymph node measures 2.4 cm in short axis on image 30 series 2. Right supraclavicular node 0.9 cm in short axis on image 6 series 2. Index left axillary node 1.2 cm in short axis on image 11 series 2. Index right axillary node 1.2 cm in short axis on image 16 series 2. There is a right upper lobe mass invading the mediastinum and displacing the trachea and esophagus to the left. 1.5 cm hypodense left thyroid lobe lesion on image 6 series 2. Lungs/Pleura: Presumed right upper lobe lung mass invading the mediastinum measures 6.6 by 4.5 cm on image 11 series 2, and displaces the trachea and esophagus to the left. Adjacent right suprahilar nodularity including a 3.1 by 1.8 cm tangential mass on image 43 series 4. The B1A apical segmental bronchus is occluded. Mild atelectasis along both diaphragms. Centrilobular emphysema. Mild airspace opacity posteriorly in the right upper lobe on image 64 series 4, probably from atelectasis. Upper Abdomen: Unremarkable Musculoskeletal: Unremarkable IMPRESSION: 1. Presumed right upper lobe lung mass invading the mediastinum and displacing the trachea and esophagus to the left, measuring 6.6 cm in long axis. Adjacent right suprahilar nodularity including a 3.1 by 1.8 cm tangential mass. The B1A apical segmental bronchus is occluded. 2. Right lower neck level V lymph node  measures 2.4 cm in short axis. Right supraclavicular node 0.9 cm in short axis. Index bilateral axillary nodes are mildly enlarged. Findings are compatible with metastatic adenopathy. 3. 1.5 cm hypodense left thyroid lobe lesion. Recommend thyroid US (ref: J Am Coll Radiol. 2015 Feb;12(2): 143-50). 4. Moderate cardiomegaly. 5. Aortic and coronary atherosclerosis. Aortic Atherosclerosis (ICD10-I70.0) and Emphysema (ICD10-J43.9). Electronically Signed   By: Gaylyn Rong M.D.   On: 11/25/2023 17:07   CT Soft Tissue Neck W Contrast Result Date: 11/25/2023 CLINICAL DATA:  Neck mass, non pulsatile lymph node. Patient reports a palpable knot in the right side his neck. EXAM: CT NECK WITH CONTRAST TECHNIQUE: Multidetector CT imaging of the neck was performed using the standard protocol following the bolus administration of intravenous contrast. RADIATION DOSE REDUCTION: This exam was performed according to the departmental dose-optimization program which includes automated exposure control, adjustment of the mA and/or kV according to patient size and/or use of iterative reconstruction technique. CONTRAST:  60mL OMNIPAQUE IOHEXOL 300 MG/ML  SOLN COMPARISON:  CT cervical spine without contrast of the same day. FINDINGS: Pharynx and larynx: Mild adenoid hypertrophy is noted. No discrete lesion is present. The oropharynx is within normal limits. Tongue base is. Vallecula and epiglottis are within normal limits. Aryepiglottic folds and piriform sinuses are clear. Vocal cords are midline and symmetric. Trachea is clear. Salivary glands: The submandibular and parotid glands and ducts are within normal limits. Thyroid: Heterogeneous nodule the inferior left lobe measures 2.2 x 2.2 cm on sagittal images. Lymph nodes: A right supraclavicular nodal mass measures 3.6 x 2.4 x 3.7 cm. A right level 4 heterogeneous node measures 15 mm. No significant upper cervical adenopathy is present. A heterogeneous right paraspinous mass begins at the level of C6-7 and extends into the mediastinum. Tumor surrounds the proximal first rib. Tumor extends into the right T1-2 foramen. No discrete osseous destruction is present. Vascular: No significant vascular calcifications are present. Limited intracranial: Within normal limits. Visualized orbits: The globes and orbits are within normal limits. Mastoids and visualized paranasal sinuses: The paranasal sinuses and mastoid air cells are clear. Skeleton:  Multilevel degenerative changes are present in the cervical spine. Uncovertebral spurring contributes to right foraminal stenosis at C6-7. No focal osseous lesions are present. Upper chest: Extensive centrilobular emphysematous changes are present. IMPRESSION: 1. Heterogeneous right paraspinous mass begins at the level of C6-7 and extends into the mediastinum. Tumor surrounds the proximal first rib. Tumor extends into the right T1-2 foramen. This is most concerning for a lung primary neoplasm. Recommend CT of the chest with and without contrast as well as MRI of the thoracic spine without and with contrast 2. Right supraclavicular nodal mass measures 3.6 x 2.4 x 3.7 cm, consistent with metastatic disease. 3. Right level 4 heterogeneous node measures 15 mm. 4. Heterogeneous nodule the inferior left lobe measures 2.2 x 2.2 cm on sagittal images. Recommend non-emergent thyroid ultrasound. Reference: J Am Coll Radiol. 2015 Feb;12(2): 143-50 5. Multilevel degenerative changes in the cervical spine. 6.  Emphysema (ICD10-J43.9). Electronically Signed   By: Marin Roberts M.D.   On: 11/25/2023 16:19   CT Cervical Spine Wo Contrast Result Date: 11/25/2023 CLINICAL DATA:  Cervical radiculopathy, infection suspected, no prior imaging EXAM: CT CERVICAL SPINE WITHOUT CONTRAST TECHNIQUE: Multidetector CT imaging of the cervical spine was performed without intravenous contrast. Multiplanar CT image reconstructions were also generated. RADIATION DOSE REDUCTION: This exam was performed according to the departmental dose-optimization program which includes automated exposure control, adjustment of the mA  and/or kV according to patient size and/or use of iterative reconstruction technique. COMPARISON:  None Available. FINDINGS: Alignment: Facet joints are aligned without dislocation or traumatic listhesis. Dens and lateral masses are aligned. Skull base and vertebrae: No acute fracture of the cervical spine. Heterogeneity of  the osseous structures although no well-defined lytic or sclerotic bone lesion. Soft tissues and spinal canal: No prevertebral fluid or swelling. No visible canal hematoma. Disc levels: Mild degenerative endplate spurring. Slight disc height loss at C6-7. Minimal facet joint spurring. Upper chest: Right supraclavicular mass and partially imaged superior mediastinal mass. 1.7 cm left thyroid lobe nodule. Other: None. IMPRESSION: 1. No acute fracture or traumatic listhesis of the cervical spine. 2. Right supraclavicular mass and partially imaged superior mediastinal mass. Attention on forthcoming CT of the neck. Contrast enhanced CT of the chest is recommended to more fully assess the mediastinal mass. 3. 1.7 cm left thyroid lobe nodule. Recommend nonemergent thyroid US (ref: J Am Coll Radiol. 2015 Feb;12(2): 143-50). Electronically Signed   By: Duanne Guess D.O.   On: 11/25/2023 15:32     The total time spent in the appointment was 40 minutes encounter with patients including review of chart and various tests results, discussions about plan of care and coordination of care plan   All questions were answered. The patient knows to call the clinic with any problems, questions or concerns. No barriers to learning was detected.  Dawson Bills, NP 12/27/202412:28 PM  ADDENDUM: I agree with the above note from [place].  She has at least a stage IIIb and possibly even stage IV bronchogenic carcinoma.  We do not have the pathology back yet.  She had the right supraclavicular lymph node biopsy.  She has already seen Radiation Oncology.  She clearly will need radiotherapy to try to help minimize damage to her spine or to her  nerves.  The MRI of the thoracic spine did not show any obvious spinal invasion.  She needs to be on Decadron to help with swelling.  I will give her a dose of IV Decadron to 20 mg today.  When she goes home, she probably will need 4 mg 2 or 3 times a day.  She does have lupus.  She has  rheumatoid arthritis.  As such, we may have to be careful with utilizing immunotherapy for her malignancy.  I would like to get an MRI of the brain to help with the staging.  She needs a CT of the abdomen/pelvis also for staging.  PET scan would be helpful.  However, this cannot be done as an inpatient.  She does have a heavy history of tobacco use.  She stopped recently.  Again, I doubt that we are looking at a tumor that can be resected at some point.  I suspect provide can be looking at combination radiation and chemotherapy for her treatment.  Hopefully, she just has locally advanced disease and not metastatic disease outside of the thoracic cavity.  I will give her a dose of Zometa.  She will need this for her bones.  If all goes well today, that she might be able to go home over the weekend.  Again, she is set up by radiation oncology to start radiotherapy.  Looks like she will get 15 treatments.  Once we get the pathology back, we will send this off for molecular analysis.  She is very nice.  She is in good shape.  I would say that her performance status is probably ECOG  1.  As such, I think we can be aggressive.   Christin Bach, MD  Fayrene Fearing 1:5

## 2023-11-26 NOTE — Plan of Care (Signed)
  Problem: Education: Goal: Knowledge of General Education information will improve Description Including pain rating scale, medication(s)/side effects and non-pharmacologic comfort measures Outcome: Progressing   Problem: Health Behavior/Discharge Planning: Goal: Ability to manage health-related needs will improve Outcome: Progressing   

## 2023-11-27 ENCOUNTER — Inpatient Hospital Stay (HOSPITAL_COMMUNITY): Payer: 59

## 2023-11-27 DIAGNOSIS — R918 Other nonspecific abnormal finding of lung field: Secondary | ICD-10-CM | POA: Diagnosis not present

## 2023-11-27 MED ORDER — OXYCODONE HCL 5 MG PO TABS: 5.0000 mg | ORAL_TABLET | ORAL | 0 refills | Status: DC | PRN

## 2023-11-27 MED ORDER — IOHEXOL 300 MG/ML  SOLN
100.0000 mL | Freq: Once | INTRAMUSCULAR | Status: AC | PRN
  Administered 2023-11-27: 100 mL via INTRAVENOUS

## 2023-11-27 MED ORDER — ALUM & MAG HYDROXIDE-SIMETH 200-200-20 MG/5ML PO SUSP: 15.0000 mL | ORAL | 0 refills | Status: DC | PRN

## 2023-11-27 NOTE — Discharge Instructions (Signed)
 [facility] in [place] [facility] [employer] [facility] [employer] [facility] in [place]

## 2023-11-27 NOTE — Progress Notes (Signed)
PROGRESS NOTE    Tammy Cordova  ZOX:[id number] DOB: Jul 08, 1966 DOA: 11/25/2023 PCP: Ollen Bowl, MD    Brief Narrative:  57 year old with history of COPD, smoker presented to the emergency room with about 6 months of right shoulder pain, arm pain.  She does have a history of rheumatoid arthritis and she thought some of this related to that pain.  Patient started having intermittent shooting pain on her right arm so came to the emergency room.  She quit smoking last week. In the emergency room hemodynamically stable.  Imaging studies consistent with right upper lobe lung tumor with infiltration into the ribs/ cervical spine. Admitted with oncology, radiation oncology consultation.  Subjective: Patient seen and examined.  No overnight events.  Pain relieved after taking intermittent dose of oxycodone. Brain MRI was normal, relayed to the patient. CT scan abdomen pelvis is pending, apparently machine is down.   Assessment & Plan:   Right upper lobe lung tumor with metastasis, invasive lung tumor.  Likely stage IV carcinoma. Status post biopsy 12/27.  Results pending. Underwent CT simulation, patient for radiation treatment is starting 1/6. MRI brain was without evidence of mets. CT scan abdomen pelvis pending today. Seen by oncology, currently symptomatic management, pain relief and follow-up with biopsy results. Dexamethasone, once Zometa 4 mg IV x 1. Likely home after CT scan.  Thyroid lesion: 1.5 cm hypodense left thyroid lobe seen.  PET/CT scan to be scheduled as outpatient.  COPD: Currently stable.  Bronchodilator as needed.  Essential hypertension: On Diovan at home.  Blood pressure stable.  Will resume on discharge.  Rheumatoid arthritis: Currently off methotrexate.  She will follow-up with her rheumatologist   DVT prophylaxis: SCDs Start: 11/26/23 0008   Code Status: Full code Family Communication: None today. Disposition Plan: Status is: Inpatient Remains  inpatient appropriate because: New diagnosis of malignancy, workup pending     Consultants:  Oncology Radiation oncology  Procedures:  Ultrasound-guided biopsy of right cervical lesion  Antimicrobials:  None     Objective: Vitals:   11/26/23 1007 11/26/23 2214 11/27/23 0556 11/27/23 1314  BP: 130/78 131/79 138/84 116/62  Pulse: 85 92 89 89  Resp:  20 18 15   Temp: 98.2 F (36.8 C) 98.5 F (36.9 C) 98.2 F (36.8 C) 98 F (36.7 C)  TempSrc: Oral Oral Oral   SpO2: 93% 94% 94% 97%  Weight:      Height:        Intake/Output Summary (Last 24 hours) at 11/27/2023 1338 Last data filed at 11/27/2023 0500 Gross per 24 hour  Intake 240 ml  Output --  Net 240 ml   Filed Weights   11/25/23 1020  Weight: 70.3 kg    Examination:  General exam: Appears calm and comfortable  Respiratory system: Clear to auscultation. Respiratory effort normal. Cardiovascular system: S1 & S2 heard, RRR.  Gastrointestinal system: Abdomen is nondistended, soft and nontender. No organomegaly or masses felt. Normal bowel sounds heard. Central nervous system: Alert and oriented. No focal neurological deficits. Extremities: Symmetric 5 x 5 power. Skin: No rashes, lesions or ulcers Psychiatry: Judgement and insight appear normal. Mood & affect appropriate.  Patient has palpable lymph nodes and firm lesions right cervical region, supraclavicular region. She does not have any neurovascular compromise.    Data Reviewed: I have personally reviewed following labs and imaging studies  CBC: Recent Labs  Lab 11/25/23 1319 11/26/23 0526  WBC  --  3.8*  HGB 14.3 12.3  HCT 42.0 39.3  MCV  --  88.7  PLT  --  303   Basic Metabolic Panel: Recent Labs  Lab 11/25/23 1319 11/26/23 0526  NA 139 133*  K 3.9 3.8  CL 103 105  CO2  --  22  GLUCOSE 114* 84  BUN 27* 19  CREATININE 1.60* 0.69  CALCIUM  --  8.0*   GFR: Estimated Creatinine Clearance: 73.5 mL/min (by C-G formula based on SCr of  0.69 mg/dL). Liver Function Tests: No results for input(s): "AST", "ALT", "ALKPHOS", "BILITOT", "PROT", "ALBUMIN" in the last 168 hours. No results for input(s): "LIPASE", "AMYLASE" in the last 168 hours. No results for input(s): "AMMONIA" in the last 168 hours. Coagulation Profile: Recent Labs  Lab 11/26/23 1112  INR 1.0   Cardiac Enzymes: No results for input(s): "CKTOTAL", "CKMB", "CKMBINDEX", "TROPONINI" in the last 168 hours. BNP (last 3 results) No results for input(s): "PROBNP" in the last 8760 hours. HbA1C: No results for input(s): "HGBA1C" in the last 72 hours. CBG: No results for input(s): "GLUCAP" in the last 168 hours. Lipid Profile: No results for input(s): "CHOL", "HDL", "LDLCALC", "TRIG", "CHOLHDL", "LDLDIRECT" in the last 72 hours. Thyroid Function Tests: No results for input(s): "TSH", "T4TOTAL", "FREET4", "T3FREE", "THYROIDAB" in the last 72 hours. Anemia Panel: No results for input(s): "VITAMINB12", "FOLATE", "FERRITIN", "TIBC", "IRON", "RETICCTPCT" in the last 72 hours. Sepsis Labs: No results for input(s): "PROCALCITON", "LATICACIDVEN" in the last 168 hours.  No results found for this or any previous visit (from the past 240 hours).       Radiology Studies: MR BRAIN W WO CONTRAST Result Date: 11/26/2023 CLINICAL DATA:  Non-small cell lung cancer, staging EXAM: MRI HEAD WITHOUT AND WITH CONTRAST TECHNIQUE: Multiplanar, multiecho pulse sequences of the brain and surrounding structures were obtained without and with intravenous contrast. CONTRAST:  7mL GADAVIST GADOBUTROL 1 MMOL/ML IV SOLN COMPARISON:  None Available. FINDINGS: Brain: No restricted diffusion to suggest acute or subacute infarct. No abnormal parenchymal or meningeal enhancement. No acute hemorrhage, mass, mass effect, or midline shift. No hydrocephalus or extra-axial collection. Pituitary and craniocervical junction within normal limits. No hemosiderin deposition to suggest remote hemorrhage.  Normal cerebral volume for age. Vascular: Normal arterial flow voids. Normal arterial and venous enhancement. Skull and upper cervical spine: Normal marrow signal. Sinuses/Orbits: Clear paranasal sinuses. No acute finding in the orbits. Other: The mastoid air cells are well aerated. IMPRESSION: No acute intracranial process. No evidence of intracranial metastatic disease. Electronically Signed   By: Wiliam Ke M.D.   On: 11/26/2023 22:28   [place] CORE BIOPSY (LYMPH NODES) Result Date: 11/26/2023 INDICATION: 57 year old woman with right upper lobe lung mass and right neck lymphadenopathy presents to IR for ultrasound-guided biopsy of right supraclavicular lymph node. EXAM: Ultrasound-guided biopsy of right supraclavicular lymph node MEDICATIONS: None. ANESTHESIA/SEDATION: None COMPLICATIONS: None immediate. PROCEDURE: Informed written consent was obtained from the patient after a thorough discussion of the procedural risks, benefits and alternatives. All questions were addressed. Maximal Sterile Barrier Technique was utilized including caps, mask, sterile gowns, sterile gloves, sterile drape, hand hygiene and skin antiseptic. A timeout was performed prior to the initiation of the procedure. Patient position supine on the ultrasound table. Right neck skin prepped and draped in usual sterile fashion. Following local lidocaine administration, 6- 18 gauge cores were obtained from the enlarged right supraclavicular lymph node utilizing continuous ultrasound guidance. Samples were sent to pathology in formalin. Needle removed and hemostasis achieved with 2 minutes of manual compression. Post procedure ultrasound images showed no  evidence of significant hemorrhage. IMPRESSION: Successful ultrasound-guided biopsy of right supraclavicular lymph node. Electronically Signed   By: Acquanetta Belling M.D.   On: 11/26/2023 16:48   MR THORACIC SPINE W WO CONTRAST Result Date: 11/25/2023 CLINICAL DATA:  Metastatic disease  evaluation EXAM: MRI THORACIC WITHOUT AND WITH CONTRAST TECHNIQUE: Multiplanar and multiecho pulse sequences of the thoracic spine were obtained without and with intravenous contrast. CONTRAST:  7mL GADAVIST GADOBUTROL 1 MMOL/ML IV SOLN COMPARISON:  No prior MRI of the thoracic spine available, correlation is made with CT chest 11/25/2023 FINDINGS: Alignment: No listhesis. Preservation of the normal thoracic kyphosis. Vertebrae: No acute fracture, evidence of discitis, or suspicious osseous lesion. No abnormal enhancement in the osseous structures. Cord:  Normal signal and morphology.  No abnormal enhancement. Paraspinal and other soft tissues: Enhancing mass in the right lung, invading the mediastinum, which was better evaluated on the 11/25/2023 CT chest. This mass is adjacent to the anterior right aspect of the C7-T4 vertebral bodies, but the cortical signal appears preserved, and no definite osseous invasion is seen. The mass appears to invade the right neural foramen at T1-T2 (series 24, images 4-5). Disc levels: No significant spinal canal stenosis or osseous neural foraminal narrowing. As described above, the enhancing mass appears to invade the right neural foramen at T1-T2, and may abut the exiting right T1 nerve roots (series 18, image 6). IMPRESSION: 1. Enhancing mass in the right lung, invading the mediastinum, which was better evaluated on the 11/25/2023 CT chest. This mass is adjacent to the anterior right aspect of the C7-T4 vertebral bodies, but the cortical signal appears preserved, and no definite osseous invasion is seen. The mass appears to invade the right neural foramen at T1-T2, and may abut the exiting right T1 nerve roots. 2. No evidence of metastatic disease in the thoracic spine. Electronically Signed   By: Wiliam Ke M.D.   On: 11/25/2023 20:52   CT CHEST WO CONTRAST Result Date: 11/25/2023 CLINICAL DATA:  Mediastinal mass EXAM: CT CHEST WITHOUT CONTRAST TECHNIQUE: Multidetector CT  imaging of the chest was performed following the standard protocol without IV contrast. RADIATION DOSE REDUCTION: This exam was performed according to the departmental dose-optimization program which includes automated exposure control, adjustment of the mA and/or kV according to patient size and/or use of iterative reconstruction technique. COMPARISON:  CT neck 11/25/2023 FINDINGS: Cardiovascular: Atheromatous vascular calcification in the aortic arch and left anterior descending coronary artery. Moderate cardiomegaly. Mediastinum/Nodes: Right lower neck level V lymph node measures 2.4 cm in short axis on image 30 series 2. Right supraclavicular node 0.9 cm in short axis on image 6 series 2. Index left axillary node 1.2 cm in short axis on image 11 series 2. Index right axillary node 1.2 cm in short axis on image 16 series 2. There is a right upper lobe mass invading the mediastinum and displacing the trachea and esophagus to the left. 1.5 cm hypodense left thyroid lobe lesion on image 6 series 2. Lungs/Pleura: Presumed right upper lobe lung mass invading the mediastinum measures 6.6 by 4.5 cm on image 11 series 2, and displaces the trachea and esophagus to the left. Adjacent right suprahilar nodularity including a 3.1 by 1.8 cm tangential mass on image 43 series 4. The B1A apical segmental bronchus is occluded. Mild atelectasis along both diaphragms. Centrilobular emphysema. Mild airspace opacity posteriorly in the right upper lobe on image 64 series 4, probably from atelectasis. Upper Abdomen: Unremarkable Musculoskeletal: Unremarkable IMPRESSION: 1. Presumed right upper lobe  lung mass invading the mediastinum and displacing the trachea and esophagus to the left, measuring 6.6 cm in long axis. Adjacent right suprahilar nodularity including a 3.1 by 1.8 cm tangential mass. The B1A apical segmental bronchus is occluded. 2. Right lower neck level V lymph node measures 2.4 cm in short axis. Right supraclavicular node  0.9 cm in short axis. Index bilateral axillary nodes are mildly enlarged. Findings are compatible with metastatic adenopathy. 3. 1.5 cm hypodense left thyroid lobe lesion. Recommend thyroid US (ref: J Am Coll Radiol. 2015 Feb;12(2): 143-50). 4. Moderate cardiomegaly. 5. Aortic and coronary atherosclerosis. Aortic Atherosclerosis (ICD10-I70.0) and Emphysema (ICD10-J43.9). Electronically Signed   By: Gaylyn Rong M.D.   On: 11/25/2023 17:07   CT Soft Tissue Neck W Contrast Result Date: 11/25/2023 CLINICAL DATA:  Neck mass, non pulsatile lymph node. Patient reports a palpable knot in the right side his neck. EXAM: CT NECK WITH CONTRAST TECHNIQUE: Multidetector CT imaging of the neck was performed using the standard protocol following the bolus administration of intravenous contrast. RADIATION DOSE REDUCTION: This exam was performed according to the departmental dose-optimization program which includes automated exposure control, adjustment of the mA and/or kV according to patient size and/or use of iterative reconstruction technique. CONTRAST:  60mL OMNIPAQUE IOHEXOL 300 MG/ML  SOLN COMPARISON:  CT cervical spine without contrast of the same day. FINDINGS: Pharynx and larynx: Mild adenoid hypertrophy is noted. No discrete lesion is present. The oropharynx is within normal limits. Tongue base is. Vallecula and epiglottis are within normal limits. Aryepiglottic folds and piriform sinuses are clear. Vocal cords are midline and symmetric. Trachea is clear. Salivary glands: The submandibular and parotid glands and ducts are within normal limits. Thyroid: Heterogeneous nodule the inferior left lobe measures 2.2 x 2.2 cm on sagittal images. Lymph nodes: A right supraclavicular nodal mass measures 3.6 x 2.4 x 3.7 cm. A right level 4 heterogeneous node measures 15 mm. No significant upper cervical adenopathy is present. A heterogeneous right paraspinous mass begins at the level of C6-7 and extends into the  mediastinum. Tumor surrounds the proximal first rib. Tumor extends into the right T1-2 foramen. No discrete osseous destruction is present. Vascular: No significant vascular calcifications are present. Limited intracranial: Within normal limits. Visualized orbits: The globes and orbits are within normal limits. Mastoids and visualized paranasal sinuses: The paranasal sinuses and mastoid air cells are clear. Skeleton: Multilevel degenerative changes are present in the cervical spine. Uncovertebral spurring contributes to right foraminal stenosis at C6-7. No focal osseous lesions are present. Upper chest: Extensive centrilobular emphysematous changes are present. IMPRESSION: 1. Heterogeneous right paraspinous mass begins at the level of C6-7 and extends into the mediastinum. Tumor surrounds the proximal first rib. Tumor extends into the right T1-2 foramen. This is most concerning for a lung primary neoplasm. Recommend CT of the chest with and without contrast as well as MRI of the thoracic spine without and with contrast 2. Right supraclavicular nodal mass measures 3.6 x 2.4 x 3.7 cm, consistent with metastatic disease. 3. Right level 4 heterogeneous node measures 15 mm. 4. Heterogeneous nodule the inferior left lobe measures 2.2 x 2.2 cm on sagittal images. Recommend non-emergent thyroid ultrasound. Reference: J Am Coll Radiol. 2015 Feb;12(2): 143-50 5. Multilevel degenerative changes in the cervical spine. 6.  Emphysema (ICD10-J43.9). Electronically Signed   By: Marin Roberts M.D.   On: 11/25/2023 16:19   CT Cervical Spine Wo Contrast Result Date: 11/25/2023 CLINICAL DATA:  Cervical radiculopathy, infection suspected, no prior imaging EXAM:  CT CERVICAL SPINE WITHOUT CONTRAST TECHNIQUE: Multidetector CT imaging of the cervical spine was performed without intravenous contrast. Multiplanar CT image reconstructions were also generated. RADIATION DOSE REDUCTION: This exam was performed according to the  departmental dose-optimization program which includes automated exposure control, adjustment of the mA and/or kV according to patient size and/or use of iterative reconstruction technique. COMPARISON:  None Available. FINDINGS: Alignment: Facet joints are aligned without dislocation or traumatic listhesis. Dens and lateral masses are aligned. Skull base and vertebrae: No acute fracture of the cervical spine. Heterogeneity of the osseous structures although no well-defined lytic or sclerotic bone lesion. Soft tissues and spinal canal: No prevertebral fluid or swelling. No visible canal hematoma. Disc levels: Mild degenerative endplate spurring. Slight disc height loss at C6-7. Minimal facet joint spurring. Upper chest: Right supraclavicular mass and partially imaged superior mediastinal mass. 1.7 cm left thyroid lobe nodule. Other: None. IMPRESSION: 1. No acute fracture or traumatic listhesis of the cervical spine. 2. Right supraclavicular mass and partially imaged superior mediastinal mass. Attention on forthcoming CT of the neck. Contrast enhanced CT of the chest is recommended to more fully assess the mediastinal mass. 3. 1.7 cm left thyroid lobe nodule. Recommend nonemergent thyroid US (ref: J Am Coll Radiol. 2015 Feb;12(2): 143-50). Electronically Signed   By: Duanne Guess D.O.   On: 11/25/2023 15:32        Scheduled Meds:  feeding supplement  237 mL Oral BID BM   pantoprazole  40 mg Oral BID   Continuous Infusions:     LOS: 2 days    Time spent: 35 minutes    Dorcas Carrow, MD [facility]

## 2023-11-27 NOTE — TOC Initial Note (Signed)
Transition of Care [facility]) - Initial/Assessment Note    Patient Details  Name: Tammy Cordova MRN: [id number] Date of Birth: [birth year]  Transition of Care [facility]) CM/SW Contact:    Adrian Prows, RN Phone Number: 11/27/2023, 4:48 PM  Clinical Narrative:                 [facility] consulted for medication assistance; spoke w/ pt in room; pt says she lives at home w/ her spouse; she plans to return at d/c; pt verified she has insurance/PCP; she has transportation home; pt says she has difficulty paying for food, and utilities; pt says she does not have DME, HH services, or home oxygen; pt does not quality for MATCH as she has insurance; she was encouraged to contact her insurance for list of medications on formulary; also encouraged her to speak w/ prescribing MD regarding samples; she verbalized understanding; pt agrees to receive resources for social services, and financial assistance; she also agreed to receive resources for [facility] in [place] and [facility] in [place]; resources placed in d/c instructions; copies of resources also given to pt; she will make appts at agencies of choice; no TOC needs.  Expected Discharge Plan: Home/Self Care Barriers to Discharge: No Barriers Identified   Patient Goals and CMS Choice Patient states their goals for this hospitalization and ongoing recovery are:: home CMS Medicare.gov Compare Post Acute Care list provided to:: Patient        Expected Discharge Plan and Services   Discharge Planning Services: CM Consult Post Acute Care Choice: NA Living arrangements for the past 2 months: Single Family Home Expected Discharge Date: 11/27/23               DME Arranged: N/A DME Agency: NA       HH Arranged: NA HH Agency: NA        Prior Living Arrangements/Services Living arrangements for the past 2 months: Single Family Home Lives with:: Spouse Patient language and need for  interpreter reviewed:: Yes Do you feel safe going back to the place where you live?: Yes      Need for Family Participation in Patient Care: Yes (Comment) Care giver support system in place?: Yes (comment) Current home services:  (n/a) Criminal Activity/Legal Involvement Pertinent to Current Situation/Hospitalization: No - Comment as needed  Activities of Daily Living   ADL Screening (condition at time of admission) Independently performs ADLs?: Yes (appropriate for developmental age) Is the patient deaf or have difficulty hearing?: No Does the patient have difficulty seeing, even when wearing glasses/contacts?: No Does the patient have difficulty concentrating, remembering, or making decisions?: No  Permission Sought/Granted Permission sought to share information with : Case Manager Permission granted to share information with : Yes, Verbal Permission Granted  Share Information with NAME: Case Manager     Permission granted to share info w Relationship: Dorthea Alana (spouse) [phone]     Emotional Assessment Appearance:: Appears stated age Attitude/Demeanor/Rapport: Gracious Affect (typically observed): Accepting Orientation: : Oriented to Self, Oriented to Place, Oriented to  Time, Oriented to Situation Alcohol / Substance Use: Not Applicable Psych Involvement: No (comment)  Admission diagnosis:  Lung mass [R91.8] Supraclavicular mass [R22.2] Right lower lobe lung mass [R91.8] Patient Active Problem List   Diagnosis Date Noted   Mass of lung 11/25/2023   Thyroid lesion 11/25/2023   Lung mass 11/25/2023   COPD  GOLD II, group C still smoking  07/28/2019  Acute respiratory failure with hypoxemia (HCC) 07/28/2019   CAP (community acquired pneumonia) 07/25/2019   Sepsis (HCC) 07/25/2019   Encounter for screening for HIV 07/25/2019   Cigarette smoker 07/25/2019   Headache 07/25/2019   PCP:  Ollen Bowl, MD Pharmacy:   [facility] [address], [place] - [address] [name] [address] [place] [address] Phone: [phone] Fax: [phone]  [facility] [address] [place] [address] Phone: [phone] Fax: [phone]     Social Drivers of Health (SDOH) Social History: SDOH Screenings   Food Insecurity: Food Insecurity Present (11/27/2023)  Housing: Low Risk  (11/27/2023)  Transportation Needs: No Transportation Needs (11/27/2023)  Utilities: At Risk (11/27/2023)  Social Connections: Unknown (04/13/2022)   [facility] from [facility]  Tobacco Use: High Risk (11/25/2023)   SDOH Interventions: Food Insecurity Interventions: Inpatient TOC, Community Resources Provided Housing Interventions: Intervention Not Indicated, Inpatient TOC Transportation Interventions: Intervention Not Indicated, Inpatient TOC Utilities Interventions: [employer] Provided, Inpatient TOC   Readmission Risk Interventions     No data to display

## 2023-11-27 NOTE — Progress Notes (Signed)
Mobility Specialist - Progress Note   11/27/23 1145  Mobility  Activity Ambulated independently in hallway  Level of Assistance Independent  Assistive Device None  Distance Ambulated (ft) 500 ft  Activity Response Tolerated well  Mobility Referral Yes  Mobility visit 1 Mobility  Mobility Specialist Start Time (ACUTE ONLY) 1137  Mobility Specialist Stop Time (ACUTE ONLY) 1145  Mobility Specialist Time Calculation (min) (ACUTE ONLY) 8 min   Pt received in bed and agreeable to mobility. No complaints during session. Pt to recliner after session with all needs met.    [facility]

## 2023-11-27 NOTE — Progress Notes (Signed)
Awaiting CT scan before completing discharge.

## 2023-11-27 NOTE — Progress Notes (Signed)
The patient is stable, no change from am assessment. Discharge instructions reviewed. She denied questions, or concerns at this time. Pt aware CT scan results will be dicussed during follow up visit.

## 2023-11-27 NOTE — Discharge Summary (Signed)
Physician Discharge Summary  Tammy Cordova WJX:[id number] DOB: Jun 27, 1966 DOA: 11/25/2023  PCP: Ollen Bowl, MD  Admit date: 11/25/2023 Discharge date: 11/27/2023  Admitted From: *** Disposition:  ***  Recommendations for Outpatient Follow-up:  Follow up with PCP in 1-2 weeks Please obtain BMP/CBC in one week Please follow up on the following pending results:  Home Health:***  Equipment/Devices:***   Discharge Condition:***  CODE STATUS:*** Diet recommendation:   Brief/Interim Summary:  Sepsis? *** Discharge Diagnoses:  Principal Problem:   Mass of lung Active Problems:   COPD  GOLD II, group C still smoking    Thyroid lesion   Lung mass    Discharge Instructions  Discharge Instructions     Diet general   Complete by: As directed    Increase activity slowly   Complete by: As directed       Allergies as of 11/27/2023   No Known Allergies      Medication List     TAKE these medications    albuterol 108 (90 Base) MCG/ACT inhaler Commonly known as: ProAir HFA 2 puffs every 4 hours as needed only  if your can't catch your breath   alum & mag hydroxide-simeth 200-200-20 MG/5ML suspension Commonly known as: MAALOX/MYLANTA Take 15 mLs by mouth every 4 (four) hours as needed for indigestion or heartburn.   budesonide-formoterol 160-4.5 MCG/ACT inhaler Commonly known as: Symbicort Take 2 puffs first thing in am and then another 2 puffs about 12 hours later.   meloxicam 15 MG tablet Commonly known as: MOBIC Take 15 mg by mouth daily.   oxyCODONE 5 MG immediate release tablet Commonly known as: Oxy IR/ROXICODONE Take 1 tablet (5 mg total) by mouth every 4 (four) hours as needed for up to 5 days for moderate pain (pain score 4-6).   pantoprazole 40 MG tablet Commonly known as: PROTONIX Take 40 mg by mouth every morning.   valACYclovir 500 MG tablet Commonly known as: VALTREX Take 500 mg by mouth 2 (two) times daily as needed.   valsartan  320 MG tablet Commonly known as: DIOVAN Take 320 mg by mouth daily.        No Known Allergies  Consultations: ***   Procedures/Studies: MR BRAIN W WO CONTRAST Result Date: 11/26/2023 CLINICAL DATA:  Non-small cell lung cancer, staging EXAM: MRI HEAD WITHOUT AND WITH CONTRAST TECHNIQUE: Multiplanar, multiecho pulse sequences of the brain and surrounding structures were obtained without and with intravenous contrast. CONTRAST:  7mL GADAVIST GADOBUTROL 1 MMOL/ML IV SOLN COMPARISON:  None Available. FINDINGS: Brain: No restricted diffusion to suggest acute or subacute infarct. No abnormal parenchymal or meningeal enhancement. No acute hemorrhage, mass, mass effect, or midline shift. No hydrocephalus or extra-axial collection. Pituitary and craniocervical junction within normal limits. No hemosiderin deposition to suggest remote hemorrhage. Normal cerebral volume for age. Vascular: Normal arterial flow voids. Normal arterial and venous enhancement. Skull and upper cervical spine: Normal marrow signal. Sinuses/Orbits: Clear paranasal sinuses. No acute finding in the orbits. Other: The mastoid air cells are well aerated. IMPRESSION: No acute intracranial process. No evidence of intracranial metastatic disease. Electronically Signed   By: Wiliam Ke M.D.   On: 11/26/2023 22:28   [place] CORE BIOPSY (LYMPH NODES) Result Date: 11/26/2023 INDICATION: 57 year old woman with right upper lobe lung mass and right neck lymphadenopathy presents to IR for ultrasound-guided biopsy of right supraclavicular lymph node. EXAM: Ultrasound-guided biopsy of right supraclavicular lymph node MEDICATIONS: None. ANESTHESIA/SEDATION: None COMPLICATIONS: None immediate. PROCEDURE: Informed written consent was obtained  from the patient after a thorough discussion of the procedural risks, benefits and alternatives. All questions were addressed. Maximal Sterile Barrier Technique was utilized including caps, mask, sterile gowns,  sterile gloves, sterile drape, hand hygiene and skin antiseptic. A timeout was performed prior to the initiation of the procedure. Patient position supine on the ultrasound table. Right neck skin prepped and draped in usual sterile fashion. Following local lidocaine administration, 6- 18 gauge cores were obtained from the enlarged right supraclavicular lymph node utilizing continuous ultrasound guidance. Samples were sent to pathology in formalin. Needle removed and hemostasis achieved with 2 minutes of manual compression. Post procedure ultrasound images showed no evidence of significant hemorrhage. IMPRESSION: Successful ultrasound-guided biopsy of right supraclavicular lymph node. Electronically Signed   By: Acquanetta Belling M.D.   On: 11/26/2023 16:48   MR THORACIC SPINE W WO CONTRAST Result Date: 11/25/2023 CLINICAL DATA:  Metastatic disease evaluation EXAM: MRI THORACIC WITHOUT AND WITH CONTRAST TECHNIQUE: Multiplanar and multiecho pulse sequences of the thoracic spine were obtained without and with intravenous contrast. CONTRAST:  7mL GADAVIST GADOBUTROL 1 MMOL/ML IV SOLN COMPARISON:  No prior MRI of the thoracic spine available, correlation is made with CT chest 11/25/2023 FINDINGS: Alignment: No listhesis. Preservation of the normal thoracic kyphosis. Vertebrae: No acute fracture, evidence of discitis, or suspicious osseous lesion. No abnormal enhancement in the osseous structures. Cord:  Normal signal and morphology.  No abnormal enhancement. Paraspinal and other soft tissues: Enhancing mass in the right lung, invading the mediastinum, which was better evaluated on the 11/25/2023 CT chest. This mass is adjacent to the anterior right aspect of the C7-T4 vertebral bodies, but the cortical signal appears preserved, and no definite osseous invasion is seen. The mass appears to invade the right neural foramen at T1-T2 (series 24, images 4-5). Disc levels: No significant spinal canal stenosis or osseous neural  foraminal narrowing. As described above, the enhancing mass appears to invade the right neural foramen at T1-T2, and may abut the exiting right T1 nerve roots (series 18, image 6). IMPRESSION: 1. Enhancing mass in the right lung, invading the mediastinum, which was better evaluated on the 11/25/2023 CT chest. This mass is adjacent to the anterior right aspect of the C7-T4 vertebral bodies, but the cortical signal appears preserved, and no definite osseous invasion is seen. The mass appears to invade the right neural foramen at T1-T2, and may abut the exiting right T1 nerve roots. 2. No evidence of metastatic disease in the thoracic spine. Electronically Signed   By: Wiliam Ke M.D.   On: 11/25/2023 20:52   CT CHEST WO CONTRAST Result Date: 11/25/2023 CLINICAL DATA:  Mediastinal mass EXAM: CT CHEST WITHOUT CONTRAST TECHNIQUE: Multidetector CT imaging of the chest was performed following the standard protocol without IV contrast. RADIATION DOSE REDUCTION: This exam was performed according to the departmental dose-optimization program which includes automated exposure control, adjustment of the mA and/or kV according to patient size and/or use of iterative reconstruction technique. COMPARISON:  CT neck 11/25/2023 FINDINGS: Cardiovascular: Atheromatous vascular calcification in the aortic arch and left anterior descending coronary artery. Moderate cardiomegaly. Mediastinum/Nodes: Right lower neck level V lymph node measures 2.4 cm in short axis on image 30 series 2. Right supraclavicular node 0.9 cm in short axis on image 6 series 2. Index left axillary node 1.2 cm in short axis on image 11 series 2. Index right axillary node 1.2 cm in short axis on image 16 series 2. There is a right upper lobe mass invading  the mediastinum and displacing the trachea and esophagus to the left. 1.5 cm hypodense left thyroid lobe lesion on image 6 series 2. Lungs/Pleura: Presumed right upper lobe lung mass invading the mediastinum  measures 6.6 by 4.5 cm on image 11 series 2, and displaces the trachea and esophagus to the left. Adjacent right suprahilar nodularity including a 3.1 by 1.8 cm tangential mass on image 43 series 4. The B1A apical segmental bronchus is occluded. Mild atelectasis along both diaphragms. Centrilobular emphysema. Mild airspace opacity posteriorly in the right upper lobe on image 64 series 4, probably from atelectasis. Upper Abdomen: Unremarkable Musculoskeletal: Unremarkable IMPRESSION: 1. Presumed right upper lobe lung mass invading the mediastinum and displacing the trachea and esophagus to the left, measuring 6.6 cm in long axis. Adjacent right suprahilar nodularity including a 3.1 by 1.8 cm tangential mass. The B1A apical segmental bronchus is occluded. 2. Right lower neck level V lymph node measures 2.4 cm in short axis. Right supraclavicular node 0.9 cm in short axis. Index bilateral axillary nodes are mildly enlarged. Findings are compatible with metastatic adenopathy. 3. 1.5 cm hypodense left thyroid lobe lesion. Recommend thyroid US (ref: J Am Coll Radiol. 2015 Feb;12(2): 143-50). 4. Moderate cardiomegaly. 5. Aortic and coronary atherosclerosis. Aortic Atherosclerosis (ICD10-I70.0) and Emphysema (ICD10-J43.9). Electronically Signed   By: Gaylyn Rong M.D.   On: 11/25/2023 17:07   CT Soft Tissue Neck W Contrast Result Date: 11/25/2023 CLINICAL DATA:  Neck mass, non pulsatile lymph node. Patient reports a palpable knot in the right side his neck. EXAM: CT NECK WITH CONTRAST TECHNIQUE: Multidetector CT imaging of the neck was performed using the standard protocol following the bolus administration of intravenous contrast. RADIATION DOSE REDUCTION: This exam was performed according to the departmental dose-optimization program which includes automated exposure control, adjustment of the mA and/or kV according to patient size and/or use of iterative reconstruction technique. CONTRAST:  60mL OMNIPAQUE  IOHEXOL 300 MG/ML  SOLN COMPARISON:  CT cervical spine without contrast of the same day. FINDINGS: Pharynx and larynx: Mild adenoid hypertrophy is noted. No discrete lesion is present. The oropharynx is within normal limits. Tongue base is. Vallecula and epiglottis are within normal limits. Aryepiglottic folds and piriform sinuses are clear. Vocal cords are midline and symmetric. Trachea is clear. Salivary glands: The submandibular and parotid glands and ducts are within normal limits. Thyroid: Heterogeneous nodule the inferior left lobe measures 2.2 x 2.2 cm on sagittal images. Lymph nodes: A right supraclavicular nodal mass measures 3.6 x 2.4 x 3.7 cm. A right level 4 heterogeneous node measures 15 mm. No significant upper cervical adenopathy is present. A heterogeneous right paraspinous mass begins at the level of C6-7 and extends into the mediastinum. Tumor surrounds the proximal first rib. Tumor extends into the right T1-2 foramen. No discrete osseous destruction is present. Vascular: No significant vascular calcifications are present. Limited intracranial: Within normal limits. Visualized orbits: The globes and orbits are within normal limits. Mastoids and visualized paranasal sinuses: The paranasal sinuses and mastoid air cells are clear. Skeleton: Multilevel degenerative changes are present in the cervical spine. Uncovertebral spurring contributes to right foraminal stenosis at C6-7. No focal osseous lesions are present. Upper chest: Extensive centrilobular emphysematous changes are present. IMPRESSION: 1. Heterogeneous right paraspinous mass begins at the level of C6-7 and extends into the mediastinum. Tumor surrounds the proximal first rib. Tumor extends into the right T1-2 foramen. This is most concerning for a lung primary neoplasm. Recommend CT of the chest with and without contrast  as well as MRI of the thoracic spine without and with contrast 2. Right supraclavicular nodal mass measures 3.6 x 2.4 x  3.7 cm, consistent with metastatic disease. 3. Right level 4 heterogeneous node measures 15 mm. 4. Heterogeneous nodule the inferior left lobe measures 2.2 x 2.2 cm on sagittal images. Recommend non-emergent thyroid ultrasound. Reference: J Am Coll Radiol. 2015 Feb;12(2): 143-50 5. Multilevel degenerative changes in the cervical spine. 6.  Emphysema (ICD10-J43.9). Electronically Signed   By: Marin Roberts M.D.   On: 11/25/2023 16:19   CT Cervical Spine Wo Contrast Result Date: 11/25/2023 CLINICAL DATA:  Cervical radiculopathy, infection suspected, no prior imaging EXAM: CT CERVICAL SPINE WITHOUT CONTRAST TECHNIQUE: Multidetector CT imaging of the cervical spine was performed without intravenous contrast. Multiplanar CT image reconstructions were also generated. RADIATION DOSE REDUCTION: This exam was performed according to the departmental dose-optimization program which includes automated exposure control, adjustment of the mA and/or kV according to patient size and/or use of iterative reconstruction technique. COMPARISON:  None Available. FINDINGS: Alignment: Facet joints are aligned without dislocation or traumatic listhesis. Dens and lateral masses are aligned. Skull base and vertebrae: No acute fracture of the cervical spine. Heterogeneity of the osseous structures although no well-defined lytic or sclerotic bone lesion. Soft tissues and spinal canal: No prevertebral fluid or swelling. No visible canal hematoma. Disc levels: Mild degenerative endplate spurring. Slight disc height loss at C6-7. Minimal facet joint spurring. Upper chest: Right supraclavicular mass and partially imaged superior mediastinal mass. 1.7 cm left thyroid lobe nodule. Other: None. IMPRESSION: 1. No acute fracture or traumatic listhesis of the cervical spine. 2. Right supraclavicular mass and partially imaged superior mediastinal mass. Attention on forthcoming CT of the neck. Contrast enhanced CT of the chest is recommended to  more fully assess the mediastinal mass. 3. 1.7 cm left thyroid lobe nodule. Recommend nonemergent thyroid US (ref: J Am Coll Radiol. 2015 Feb;12(2): 143-50). Electronically Signed   By: Duanne Guess D.O.   On: 11/25/2023 15:32   (Echo, Carotid, EGD, Colonoscopy, ERCP)    Subjective: ***   Discharge Exam: Vitals:   11/27/23 0556 11/27/23 1314  BP: 138/84 116/62  Pulse: 89 89  Resp: 18 15  Temp: 98.2 F (36.8 C) 98 F (36.7 C)  SpO2: 94% 97%   Vitals:   11/26/23 1007 11/26/23 2214 11/27/23 0556 11/27/23 1314  BP: 130/78 131/79 138/84 116/62  Pulse: 85 92 89 89  Resp:  20 18 15   Temp: 98.2 F (36.8 C) 98.5 F (36.9 C) 98.2 F (36.8 C) 98 F (36.7 C)  TempSrc: Oral Oral Oral   SpO2: 93% 94% 94% 97%  Weight:      Height:        General: Pt is alert, awake, not in acute distress Cardiovascular: RRR, S1/S2 +, no rubs, no gallops Respiratory: CTA bilaterally, no wheezing, no rhonchi Abdominal: Soft, NT, ND, bowel sounds + Extremities: no edema, no cyanosis    The results of significant diagnostics from this hospitalization (including imaging, microbiology, ancillary and laboratory) are listed below for reference.     Microbiology: No results found for this or any previous visit (from the past 240 hours).   Labs: BNP (last 3 results) No results for input(s): "BNP" in the last 8760 hours. Basic Metabolic Panel: Recent Labs  Lab 11/25/23 1319 11/26/23 0526  NA 139 133*  K 3.9 3.8  CL 103 105  CO2  --  22  GLUCOSE 114* 84  BUN 27* 19  CREATININE 1.60* 0.69  CALCIUM  --  8.0*   Liver Function Tests: No results for input(s): "AST", "ALT", "ALKPHOS", "BILITOT", "PROT", "ALBUMIN" in the last 168 hours. No results for input(s): "LIPASE", "AMYLASE" in the last 168 hours. No results for input(s): "AMMONIA" in the last 168 hours. CBC: Recent Labs  Lab 11/25/23 1319 11/26/23 0526  WBC  --  3.8*  HGB 14.3 12.3  HCT 42.0 39.3  MCV  --  88.7  PLT  --  303    Cardiac Enzymes: No results for input(s): "CKTOTAL", "CKMB", "CKMBINDEX", "TROPONINI" in the last 168 hours. BNP: Invalid input(s): "POCBNP" CBG: No results for input(s): "GLUCAP" in the last 168 hours. D-Dimer No results for input(s): "DDIMER" in the last 72 hours. Hgb A1c No results for input(s): "HGBA1C" in the last 72 hours. Lipid Profile No results for input(s): "CHOL", "HDL", "LDLCALC", "TRIG", "CHOLHDL", "LDLDIRECT" in the last 72 hours. Thyroid function studies No results for input(s): "TSH", "T4TOTAL", "T3FREE", "THYROIDAB" in the last 72 hours.  Invalid input(s): "FREET3" Anemia work up No results for input(s): "VITAMINB12", "FOLATE", "FERRITIN", "TIBC", "IRON", "RETICCTPCT" in the last 72 hours. Urinalysis    Component Value Date/Time   COLORURINE YELLOW 07/24/2019 1954   APPEARANCEUR HAZY (A) 07/24/2019 1954   LABSPEC 1.017 07/24/2019 1954   PHURINE 5.0 07/24/2019 1954   GLUCOSEU NEGATIVE 07/24/2019 1954   HGBUR SMALL (A) 07/24/2019 1954   BILIRUBINUR NEGATIVE 07/24/2019 1954   KETONESUR NEGATIVE 07/24/2019 1954   PROTEINUR 30 (A) 07/24/2019 1954   UROBILINOGEN 0.2 01/25/2013 0823   NITRITE NEGATIVE 07/24/2019 1954   LEUKOCYTESUR LARGE (A) 07/24/2019 1954   Sepsis Labs Recent Labs  Lab 11/26/23 0526  WBC 3.8*   Microbiology No results found for this or any previous visit (from the past 240 hours).   Time coordinating discharge:  minutes  SIGNED:   Dorcas Carrow, MD  [facility] 11/27/2023, 3:50 PM

## 2023-11-28 ENCOUNTER — Other Ambulatory Visit: Payer: Self-pay | Admitting: Radiation Oncology

## 2023-11-28 DIAGNOSIS — C3490 Malignant neoplasm of unspecified part of unspecified bronchus or lung: Secondary | ICD-10-CM

## 2023-11-29 ENCOUNTER — Encounter: Payer: Self-pay | Admitting: *Deleted

## 2023-11-29 NOTE — Progress Notes (Signed)
Patient was discharged last week. Her core biopsy is still pending. She has already been seen by RadOnc and plan to start radiation for 12/06/2023.  Called and spoke to patient. She is aware of Thursday's appointment including time and location. She has no additional needs at this time.   Oncology Nurse Navigator Documentation     11/29/2023   10:30 AM  Oncology Nurse Navigator Flowsheets  Navigator Follow Up Date: 12/02/2023  Navigator Follow Up Reason: New Patient Appointment  Navigator Location [facility]  Navigator Encounter Type Introductory Phone Call  Patient Visit Type MedOnc  Treatment Phase Abnormal Scans  Barriers/Navigation Needs Coordination of Care  Interventions Education  Acuity Level 2-Minimal Needs (1-2 Barriers Identified)  Education Method Verbal  Time Spent with Patient 15

## 2023-11-29 NOTE — Progress Notes (Signed)
Radiation Oncology         [phone] ________________________________  Name: Tammy Cordova MRN: [id number]  Date: 11/26/2023  DOB: 1966/04/25  GM:[id number], Tammy Knudsen, MD  [name], MD     REFERRING PHYSICIAN: [name], MD   DIAGNOSIS: The encounter diagnosis was Lung mass.   HISTORY OF PRESENT ILLNESS::Tammy Cordova is a 57 y.o. female who is seen as an inpatient for an initial consultation visit regarding the patient's diagnosis of a lung mas in the right upper lobe which is suspicious for cancer. Associated lymphadenopathy is present, including multiples nodes in the SCLV region. The patient has been having pain in the right neck and shoulder region. This is under much better control with inpatient management. Brain MRI is pending, and the patient will need a PET scan as soon as possible as an outpatient as well. Biopsy pending today as well from the SCLV region    PREVIOUS RADIATION THERAPY: No   PAST MEDICAL HISTORY:  has a past medical history of COPD (chronic obstructive pulmonary disease) (HCC) and Pneumonia.     PAST SURGICAL HISTORY: Past Surgical History:  Procedure Laterality Date   BREAST BIOPSY Left 04/2016   REACTIVE LYMPHOID HYPERPLASIA    BUNIONECTOMY     bilateral     FAMILY HISTORY: family history includes Breast cancer (age of onset: 86) in her paternal grandmother; Cancer in her father; Diabetes in her father and mother; Hypertension in her father and mother.   SOCIAL HISTORY:  reports that she has been smoking cigarettes. She has a 10.8 pack-year smoking history. She has never used smokeless tobacco. She reports current drug use. Drug: Marijuana. She reports that she does not drink alcohol.   ALLERGIES: Patient has no known allergies.   MEDICATIONS:  Current Outpatient Medications  Medication Sig Dispense Refill   albuterol (PROAIR HFA) 108 (90 Base) MCG/ACT inhaler 2 puffs every 4 hours as needed only  if your can't catch your  breath 18 g 1   alum & mag hydroxide-simeth (MAALOX/MYLANTA) 200-200-20 MG/5ML suspension Take 15 mLs by mouth every 4 (four) hours as needed for indigestion or heartburn. 355 mL 0   budesonide-formoterol (SYMBICORT) 160-4.5 MCG/ACT inhaler Take 2 puffs first thing in am and then another 2 puffs about 12 hours later. 1 each 11   meloxicam (MOBIC) 15 MG tablet Take 15 mg by mouth daily.     oxyCODONE (OXY IR/ROXICODONE) 5 MG immediate release tablet Take 1 tablet (5 mg total) by mouth every 4 (four) hours as needed for up to 5 days for moderate pain (pain score 4-6). 20 tablet 0   pantoprazole (PROTONIX) 40 MG tablet Take 40 mg by mouth every morning.     valACYclovir (VALTREX) 500 MG tablet Take 500 mg by mouth 2 (two) times daily as needed.     valsartan (DIOVAN) 320 MG tablet Take 320 mg by mouth daily.     No current facility-administered medications for this encounter.     REVIEW OF SYSTEMS:  A 15 point review of systems is documented in the electronic medical record. This was obtained by the nursing staff. However, I reviewed this with the patient to discuss relevant findings and make appropriate changes.  Pertinent items are noted in HPI.    PHYSICAL EXAM:  vitals were not taken for this visit.   ECOG = 1  0 - Asymptomatic (Fully active, able to carry on all predisease activities without restriction)  1 - Symptomatic but  completely ambulatory (Restricted in physically strenuous activity but ambulatory and able to carry out work of a light or sedentary nature. For example, light housework, office work)  2 - Symptomatic, <50% in bed during the day (Ambulatory and capable of all self care but unable to carry out any work activities. Up and about more than 50% of waking hours)  3 - Symptomatic, >50% in bed, but not bedbound (Capable of only limited self-care, confined to bed or chair 50% or more of waking hours)  4 - Bedbound (Completely disabled. Cannot carry on any self-care. Totally  confined to bed or chair)  5 - Death   [name] MM, [name], [name], et al. [phone]). "Toxicity and response criteria of the [facility]". Am. [name]. Oncol. 5 (6): 649-55  Alert, no distress   LABORATORY DATA:  Lab Results  Component Value Date   WBC 3.8 (L) 11/26/2023   HGB 12.3 11/26/2023   HCT 39.3 11/26/2023   MCV 88.7 11/26/2023   PLT 303 11/26/2023   Lab Results  Component Value Date   NA 133 (L) 11/26/2023   K 3.8 11/26/2023   CL 105 11/26/2023   CO2 22 11/26/2023   Lab Results  Component Value Date   ALT 21 07/24/2019   AST 30 07/24/2019   ALKPHOS 88 07/24/2019   BILITOT 1.0 07/24/2019      RADIOGRAPHY: CT ABDOMEN PELVIS W CONTRAST Result Date: 11/27/2023 CLINICAL DATA:  Non-small cell lung cancer (NSCLC), Staging for lung cancer * Tracking Code: BO * EXAM: CT ABDOMEN AND PELVIS WITH CONTRAST TECHNIQUE: Multidetector CT imaging of the abdomen and pelvis was performed using the standard protocol following bolus administration of intravenous contrast. RADIATION DOSE REDUCTION: This exam was performed according to the departmental dose-optimization program which includes automated exposure control, adjustment of the mA and/or kV according to patient size and/or use of iterative reconstruction technique. CONTRAST:  OMNIPAQUE IOHEXOL 300 MG/ML  SOLN COMPARISON:  None Available. FINDINGS: Lower chest: There are emphysematous changes in the visualized bilateral lungs. There also patchy areas of atelectasis/scarring. No mass or consolidation. No pleural or pericardial effusion. Normal cardiac size. Hepatobiliary: The liver is normal in size. Non-cirrhotic configuration. No suspicious mass. These is mild diffuse hepatic steatosis. No intrahepatic or extrahepatic bile duct dilation. No calcified gallstones. Normal gallbladder wall thickness. No pericholecystic inflammatory changes. Pancreas: Unremarkable. No pancreatic ductal dilatation or surrounding  inflammatory changes. Spleen: Within normal limits. No focal lesion. Adrenals/Urinary Tract: There is an indeterminate 8 x 10 mm right adrenal nodule, which can not be characterized as an adenoma. However, this nodule was present on the prior study from 07/27/2019. Unremarkable left adrenal gland. No suspicious renal mass. There is a subcentimeter sized hypoattenuating structure in the right kidney interpolar region, laterally, which is too small to adequately characterize. No hydronephrosis. No renal or ureteric calculi. Unremarkable urinary bladder. Stomach/Bowel: No disproportionate dilation of the small or large bowel loops. No evidence of abnormal bowel wall thickening or inflammatory changes. The appendix is unremarkable. Vascular/Lymphatic: No ascites or pneumoperitoneum. No abdominal or pelvic lymphadenopathy, by size criteria. No aneurysmal dilation of the major abdominal arteries. There are mild peripheral atherosclerotic vascular calcifications of the aorta and its major branches. Reproductive: Retroverted lobulated uterus containing several ill-defined hyperattenuating areas, favored to represent leiomyomas. No large adnexal mass seen. Other: There is a tiny fat containing umbilical hernia. The soft tissues and abdominal wall are otherwise unremarkable. Musculoskeletal: No suspicious osseous lesions. There are mild  multilevel degenerative changes in the visualized spine. IMPRESSION: 1. No metastatic lung carcinoma seen within the abdomen or pelvis. 2. Multiple other nonacute observations, as described above. Electronically Signed   By: Jules Schick M.D.   On: 11/27/2023 20:44   MR BRAIN W WO CONTRAST Result Date: 11/26/2023 CLINICAL DATA:  Non-small cell lung cancer, staging EXAM: MRI HEAD WITHOUT AND WITH CONTRAST TECHNIQUE: Multiplanar, multiecho pulse sequences of the brain and surrounding structures were obtained without and with intravenous contrast. CONTRAST:  7mL GADAVIST GADOBUTROL 1  MMOL/ML IV SOLN COMPARISON:  None Available. FINDINGS: Brain: No restricted diffusion to suggest acute or subacute infarct. No abnormal parenchymal or meningeal enhancement. No acute hemorrhage, mass, mass effect, or midline shift. No hydrocephalus or extra-axial collection. Pituitary and craniocervical junction within normal limits. No hemosiderin deposition to suggest remote hemorrhage. Normal cerebral volume for age. Vascular: Normal arterial flow voids. Normal arterial and venous enhancement. Skull and upper cervical spine: Normal marrow signal. Sinuses/Orbits: Clear paranasal sinuses. No acute finding in the orbits. Other: The mastoid air cells are well aerated. IMPRESSION: No acute intracranial process. No evidence of intracranial metastatic disease. Electronically Signed   By: Wiliam Ke M.D.   On: 11/26/2023 22:28   [place] CORE BIOPSY (LYMPH NODES) Result Date: 11/26/2023 INDICATION: 57 year old woman with right upper lobe lung mass and right neck lymphadenopathy presents to IR for ultrasound-guided biopsy of right supraclavicular lymph node. EXAM: Ultrasound-guided biopsy of right supraclavicular lymph node MEDICATIONS: None. ANESTHESIA/SEDATION: None COMPLICATIONS: None immediate. PROCEDURE: Informed written consent was obtained from the patient after a thorough discussion of the procedural risks, benefits and alternatives. All questions were addressed. Maximal Sterile Barrier Technique was utilized including caps, mask, sterile gowns, sterile gloves, sterile drape, hand hygiene and skin antiseptic. A timeout was performed prior to the initiation of the procedure. Patient position supine on the ultrasound table. Right neck skin prepped and draped in usual sterile fashion. Following local lidocaine administration, 6- 18 gauge cores were obtained from the enlarged right supraclavicular lymph node utilizing continuous ultrasound guidance. Samples were sent to pathology in formalin. Needle removed and  hemostasis achieved with 2 minutes of manual compression. Post procedure ultrasound images showed no evidence of significant hemorrhage. IMPRESSION: Successful ultrasound-guided biopsy of right supraclavicular lymph node. Electronically Signed   By: Acquanetta Belling M.D.   On: 11/26/2023 16:48   MR THORACIC SPINE W WO CONTRAST Result Date: 11/25/2023 CLINICAL DATA:  Metastatic disease evaluation EXAM: MRI THORACIC WITHOUT AND WITH CONTRAST TECHNIQUE: Multiplanar and multiecho pulse sequences of the thoracic spine were obtained without and with intravenous contrast. CONTRAST:  7mL GADAVIST GADOBUTROL 1 MMOL/ML IV SOLN COMPARISON:  No prior MRI of the thoracic spine available, correlation is made with CT chest 11/25/2023 FINDINGS: Alignment: No listhesis. Preservation of the normal thoracic kyphosis. Vertebrae: No acute fracture, evidence of discitis, or suspicious osseous lesion. No abnormal enhancement in the osseous structures. Cord:  Normal signal and morphology.  No abnormal enhancement. Paraspinal and other soft tissues: Enhancing mass in the right lung, invading the mediastinum, which was better evaluated on the 11/25/2023 CT chest. This mass is adjacent to the anterior right aspect of the C7-T4 vertebral bodies, but the cortical signal appears preserved, and no definite osseous invasion is seen. The mass appears to invade the right neural foramen at T1-T2 (series 24, images 4-5). Disc levels: No significant spinal canal stenosis or osseous neural foraminal narrowing. As described above, the enhancing mass appears to invade the right neural foramen at T1-T2,  and may abut the exiting right T1 nerve roots (series 18, image 6). IMPRESSION: 1. Enhancing mass in the right lung, invading the mediastinum, which was better evaluated on the 11/25/2023 CT chest. This mass is adjacent to the anterior right aspect of the C7-T4 vertebral bodies, but the cortical signal appears preserved, and no definite osseous invasion is  seen. The mass appears to invade the right neural foramen at T1-T2, and may abut the exiting right T1 nerve roots. 2. No evidence of metastatic disease in the thoracic spine. Electronically Signed   By: Wiliam Ke M.D.   On: 11/25/2023 20:52   CT CHEST WO CONTRAST Result Date: 11/25/2023 CLINICAL DATA:  Mediastinal mass EXAM: CT CHEST WITHOUT CONTRAST TECHNIQUE: Multidetector CT imaging of the chest was performed following the standard protocol without IV contrast. RADIATION DOSE REDUCTION: This exam was performed according to the departmental dose-optimization program which includes automated exposure control, adjustment of the mA and/or kV according to patient size and/or use of iterative reconstruction technique. COMPARISON:  CT neck 11/25/2023 FINDINGS: Cardiovascular: Atheromatous vascular calcification in the aortic arch and left anterior descending coronary artery. Moderate cardiomegaly. Mediastinum/Nodes: Right lower neck level V lymph node measures 2.4 cm in short axis on image 30 series 2. Right supraclavicular node 0.9 cm in short axis on image 6 series 2. Index left axillary node 1.2 cm in short axis on image 11 series 2. Index right axillary node 1.2 cm in short axis on image 16 series 2. There is a right upper lobe mass invading the mediastinum and displacing the trachea and esophagus to the left. 1.5 cm hypodense left thyroid lobe lesion on image 6 series 2. Lungs/Pleura: Presumed right upper lobe lung mass invading the mediastinum measures 6.6 by 4.5 cm on image 11 series 2, and displaces the trachea and esophagus to the left. Adjacent right suprahilar nodularity including a 3.1 by 1.8 cm tangential mass on image 43 series 4. The B1A apical segmental bronchus is occluded. Mild atelectasis along both diaphragms. Centrilobular emphysema. Mild airspace opacity posteriorly in the right upper lobe on image 64 series 4, probably from atelectasis. Upper Abdomen: Unremarkable Musculoskeletal:  Unremarkable IMPRESSION: 1. Presumed right upper lobe lung mass invading the mediastinum and displacing the trachea and esophagus to the left, measuring 6.6 cm in long axis. Adjacent right suprahilar nodularity including a 3.1 by 1.8 cm tangential mass. The B1A apical segmental bronchus is occluded. 2. Right lower neck level V lymph node measures 2.4 cm in short axis. Right supraclavicular node 0.9 cm in short axis. Index bilateral axillary nodes are mildly enlarged. Findings are compatible with metastatic adenopathy. 3. 1.5 cm hypodense left thyroid lobe lesion. Recommend thyroid US (ref: J Am Coll Radiol. 2015 Feb;12(2): 143-50). 4. Moderate cardiomegaly. 5. Aortic and coronary atherosclerosis. Aortic Atherosclerosis (ICD10-I70.0) and Emphysema (ICD10-J43.9). Electronically Signed   By: Gaylyn Rong M.D.   On: 11/25/2023 17:07   CT Soft Tissue Neck W Contrast Result Date: 11/25/2023 CLINICAL DATA:  Neck mass, non pulsatile lymph node. Patient reports a palpable knot in the right side his neck. EXAM: CT NECK WITH CONTRAST TECHNIQUE: Multidetector CT imaging of the neck was performed using the standard protocol following the bolus administration of intravenous contrast. RADIATION DOSE REDUCTION: This exam was performed according to the departmental dose-optimization program which includes automated exposure control, adjustment of the mA and/or kV according to patient size and/or use of iterative reconstruction technique. CONTRAST:  60mL OMNIPAQUE IOHEXOL 300 MG/ML  SOLN COMPARISON:  CT cervical  spine without contrast of the same day. FINDINGS: Pharynx and larynx: Mild adenoid hypertrophy is noted. No discrete lesion is present. The oropharynx is within normal limits. Tongue base is. Vallecula and epiglottis are within normal limits. Aryepiglottic folds and piriform sinuses are clear. Vocal cords are midline and symmetric. Trachea is clear. Salivary glands: The submandibular and parotid glands and ducts are  within normal limits. Thyroid: Heterogeneous nodule the inferior left lobe measures 2.2 x 2.2 cm on sagittal images. Lymph nodes: A right supraclavicular nodal mass measures 3.6 x 2.4 x 3.7 cm. A right level 4 heterogeneous node measures 15 mm. No significant upper cervical adenopathy is present. A heterogeneous right paraspinous mass begins at the level of C6-7 and extends into the mediastinum. Tumor surrounds the proximal first rib. Tumor extends into the right T1-2 foramen. No discrete osseous destruction is present. Vascular: No significant vascular calcifications are present. Limited intracranial: Within normal limits. Visualized orbits: The globes and orbits are within normal limits. Mastoids and visualized paranasal sinuses: The paranasal sinuses and mastoid air cells are clear. Skeleton: Multilevel degenerative changes are present in the cervical spine. Uncovertebral spurring contributes to right foraminal stenosis at C6-7. No focal osseous lesions are present. Upper chest: Extensive centrilobular emphysematous changes are present. IMPRESSION: 1. Heterogeneous right paraspinous mass begins at the level of C6-7 and extends into the mediastinum. Tumor surrounds the proximal first rib. Tumor extends into the right T1-2 foramen. This is most concerning for a lung primary neoplasm. Recommend CT of the chest with and without contrast as well as MRI of the thoracic spine without and with contrast 2. Right supraclavicular nodal mass measures 3.6 x 2.4 x 3.7 cm, consistent with metastatic disease. 3. Right level 4 heterogeneous node measures 15 mm. 4. Heterogeneous nodule the inferior left lobe measures 2.2 x 2.2 cm on sagittal images. Recommend non-emergent thyroid ultrasound. Reference: J Am Coll Radiol. 2015 Feb;12(2): 143-50 5. Multilevel degenerative changes in the cervical spine. 6.  Emphysema (ICD10-J43.9). Electronically Signed   By: Marin Roberts M.D.   On: 11/25/2023 16:19   CT Cervical Spine Wo  Contrast Result Date: 11/25/2023 CLINICAL DATA:  Cervical radiculopathy, infection suspected, no prior imaging EXAM: CT CERVICAL SPINE WITHOUT CONTRAST TECHNIQUE: Multidetector CT imaging of the cervical spine was performed without intravenous contrast. Multiplanar CT image reconstructions were also generated. RADIATION DOSE REDUCTION: This exam was performed according to the departmental dose-optimization program which includes automated exposure control, adjustment of the mA and/or kV according to patient size and/or use of iterative reconstruction technique. COMPARISON:  None Available. FINDINGS: Alignment: Facet joints are aligned without dislocation or traumatic listhesis. Dens and lateral masses are aligned. Skull base and vertebrae: No acute fracture of the cervical spine. Heterogeneity of the osseous structures although no well-defined lytic or sclerotic bone lesion. Soft tissues and spinal canal: No prevertebral fluid or swelling. No visible canal hematoma. Disc levels: Mild degenerative endplate spurring. Slight disc height loss at C6-7. Minimal facet joint spurring. Upper chest: Right supraclavicular mass and partially imaged superior mediastinal mass. 1.7 cm left thyroid lobe nodule. Other: None. IMPRESSION: 1. No acute fracture or traumatic listhesis of the cervical spine. 2. Right supraclavicular mass and partially imaged superior mediastinal mass. Attention on forthcoming CT of the neck. Contrast enhanced CT of the chest is recommended to more fully assess the mediastinal mass. 3. 1.7 cm left thyroid lobe nodule. Recommend nonemergent thyroid US (ref: J Am Coll Radiol. 2015 Feb;12(2): 143-50). Electronically Signed   By: Duanne Guess  D.O.   On: 11/25/2023 15:32       IMPRESSION/ PLAN:  The patient is doing much better clinically with better pain control. The imaging is consistent with a lung cancer at this point, with biopsy pending from today. Also will get brain MRI and also a PET scan. I  talked with the patient about a possible course of XRT. Further imaging will help determine the goals of treatment and therefore the length of treatment; ie. 3 weeks palliative course vs a full 6 1/2 week definitive course. She will see Dr. Myna Hidalgo next week to coordinate systemic treatment and hopefully we will have more information at that point. I have tentatively talked to her about possibly starting XRT on 1/6 but this can be adjusted as necessary.   The patient was seen in person today.  The total time spent on the patient's visit today was 45 minutes, including chart review, direct discussion/evaluation with the patient, and coordination of care.     ________________________________   Radene Gunning, MD, PhD   **Disclaimer: This note was dictated with voice recognition software. Similar sounding words can inadvertently be transcribed and this note may contain transcription errors which may not have been corrected upon publication of note.**

## 2023-11-30 ENCOUNTER — Encounter (HOSPITAL_COMMUNITY)
Admission: RE | Admit: 2023-11-30 | Discharge: 2023-11-30 | Disposition: A | Discharge status: Home / Self Care | Payer: 59 | Source: Ambulatory Visit | Attending: Radiation Oncology | Admitting: Radiation Oncology

## 2023-11-30 DIAGNOSIS — C3490 Malignant neoplasm of unspecified part of unspecified bronchus or lung: Secondary | ICD-10-CM | POA: Insufficient documentation

## 2023-11-30 DIAGNOSIS — C3411 Malignant neoplasm of upper lobe, right bronchus or lung: Secondary | ICD-10-CM | POA: Diagnosis not present

## 2023-11-30 LAB — GLUCOSE, CAPILLARY: Glucose-Capillary: 117 mg/dL — ABNORMAL HIGH (ref 70–99)

## 2023-11-30 MED ORDER — FLUDEOXYGLUCOSE F - 18 (FDG) INJECTION
7.6000 | Freq: Once | INTRAVENOUS | Status: AC
  Administered 2023-11-30: 7.69 via INTRAVENOUS

## 2023-12-02 ENCOUNTER — Other Ambulatory Visit: Payer: 59

## 2023-12-02 ENCOUNTER — Other Ambulatory Visit: Payer: Self-pay

## 2023-12-02 ENCOUNTER — Ambulatory Visit: Payer: 59 | Admitting: Hematology & Oncology

## 2023-12-02 ENCOUNTER — Emergency Department (HOSPITAL_COMMUNITY): Payer: 59

## 2023-12-02 ENCOUNTER — Ambulatory Visit
Admission: RE | Admit: 2023-12-02 | Discharge: 2023-12-02 | Disposition: A | Discharge status: Home / Self Care | Payer: 59 | Source: Ambulatory Visit | Attending: Radiation Oncology | Admitting: Radiation Oncology

## 2023-12-02 ENCOUNTER — Encounter: Payer: Self-pay | Admitting: *Deleted

## 2023-12-02 ENCOUNTER — Encounter: Payer: Self-pay | Admitting: Radiation Oncology

## 2023-12-02 ENCOUNTER — Inpatient Hospital Stay (HOSPITAL_COMMUNITY)
Admission: EM | Admit: 2023-12-02 | Discharge: 2023-12-09 | DRG: 166 | Disposition: A | Discharge status: Home / Self Care | Payer: 59 | Attending: Internal Medicine | Admitting: Internal Medicine

## 2023-12-02 ENCOUNTER — Encounter (HOSPITAL_COMMUNITY): Payer: Self-pay

## 2023-12-02 DIAGNOSIS — I10 Essential (primary) hypertension: Secondary | ICD-10-CM | POA: Diagnosis present

## 2023-12-02 DIAGNOSIS — Z791 Long term (current) use of non-steroidal anti-inflammatories (NSAID): Secondary | ICD-10-CM

## 2023-12-02 DIAGNOSIS — Z6825 Body mass index (BMI) 25.0-25.9, adult: Secondary | ICD-10-CM

## 2023-12-02 DIAGNOSIS — Z809 Family history of malignant neoplasm, unspecified: Secondary | ICD-10-CM

## 2023-12-02 DIAGNOSIS — R531 Weakness: Secondary | ICD-10-CM

## 2023-12-02 DIAGNOSIS — R7989 Other specified abnormal findings of blood chemistry: Secondary | ICD-10-CM | POA: Diagnosis present

## 2023-12-02 DIAGNOSIS — F17210 Nicotine dependence, cigarettes, uncomplicated: Secondary | ICD-10-CM | POA: Diagnosis present

## 2023-12-02 DIAGNOSIS — E041 Nontoxic single thyroid nodule: Secondary | ICD-10-CM | POA: Diagnosis present

## 2023-12-02 DIAGNOSIS — I2721 Secondary pulmonary arterial hypertension: Secondary | ICD-10-CM | POA: Diagnosis present

## 2023-12-02 DIAGNOSIS — Z79899 Other long term (current) drug therapy: Secondary | ICD-10-CM | POA: Diagnosis not present

## 2023-12-02 DIAGNOSIS — C770 Secondary and unspecified malignant neoplasm of lymph nodes of head, face and neck: Secondary | ICD-10-CM | POA: Diagnosis present

## 2023-12-02 DIAGNOSIS — R0603 Acute respiratory distress: Principal | ICD-10-CM

## 2023-12-02 DIAGNOSIS — D72819 Decreased white blood cell count, unspecified: Secondary | ICD-10-CM | POA: Diagnosis present

## 2023-12-02 DIAGNOSIS — E876 Hypokalemia: Secondary | ICD-10-CM | POA: Diagnosis present

## 2023-12-02 DIAGNOSIS — Z8249 Family history of ischemic heart disease and other diseases of the circulatory system: Secondary | ICD-10-CM

## 2023-12-02 DIAGNOSIS — Z833 Family history of diabetes mellitus: Secondary | ICD-10-CM

## 2023-12-02 DIAGNOSIS — Z7951 Long term (current) use of inhaled steroids: Secondary | ICD-10-CM

## 2023-12-02 DIAGNOSIS — J9601 Acute respiratory failure with hypoxia: Secondary | ICD-10-CM | POA: Diagnosis present

## 2023-12-02 DIAGNOSIS — Z510 Encounter for antineoplastic radiation therapy: Secondary | ICD-10-CM | POA: Insufficient documentation

## 2023-12-02 DIAGNOSIS — E8809 Other disorders of plasma-protein metabolism, not elsewhere classified: Secondary | ICD-10-CM | POA: Diagnosis present

## 2023-12-02 DIAGNOSIS — R627 Adult failure to thrive: Secondary | ICD-10-CM | POA: Diagnosis present

## 2023-12-02 DIAGNOSIS — M069 Rheumatoid arthritis, unspecified: Secondary | ICD-10-CM | POA: Diagnosis present

## 2023-12-02 DIAGNOSIS — C3411 Malignant neoplasm of upper lobe, right bronchus or lung: Secondary | ICD-10-CM | POA: Diagnosis present

## 2023-12-02 DIAGNOSIS — C773 Secondary and unspecified malignant neoplasm of axilla and upper limb lymph nodes: Secondary | ICD-10-CM | POA: Diagnosis present

## 2023-12-02 DIAGNOSIS — D630 Anemia in neoplastic disease: Secondary | ICD-10-CM | POA: Diagnosis present

## 2023-12-02 DIAGNOSIS — J449 Chronic obstructive pulmonary disease, unspecified: Secondary | ICD-10-CM | POA: Diagnosis present

## 2023-12-02 DIAGNOSIS — R1310 Dysphagia, unspecified: Secondary | ICD-10-CM | POA: Diagnosis present

## 2023-12-02 DIAGNOSIS — Z1152 Encounter for screening for COVID-19: Secondary | ICD-10-CM | POA: Diagnosis not present

## 2023-12-02 DIAGNOSIS — Z803 Family history of malignant neoplasm of breast: Secondary | ICD-10-CM

## 2023-12-02 DIAGNOSIS — J439 Emphysema, unspecified: Secondary | ICD-10-CM | POA: Diagnosis present

## 2023-12-02 DIAGNOSIS — G4700 Insomnia, unspecified: Secondary | ICD-10-CM | POA: Diagnosis present

## 2023-12-02 LAB — BASIC METABOLIC PANEL
Anion gap: 10 (ref 5–15)
BUN: 22 mg/dL — ABNORMAL HIGH (ref 6–20)
CO2: 16 mmol/L — ABNORMAL LOW (ref 22–32)
Calcium: 6.2 mg/dL — CL (ref 8.9–10.3)
Chloride: 107 mmol/L (ref 98–111)
Creatinine, Ser: 1.12 mg/dL — ABNORMAL HIGH (ref 0.44–1.00)
GFR, Estimated: 58 mL/min — ABNORMAL LOW (ref >60)
Glucose, Bld: 142 mg/dL — ABNORMAL HIGH (ref 70–99)
Potassium: 3.9 mmol/L (ref 3.5–5.1)
Sodium: 133 mmol/L — ABNORMAL LOW (ref 135–145)

## 2023-12-02 LAB — TROPONIN I (HIGH SENSITIVITY)
Troponin I (High Sensitivity): 7 ng/L (ref <18)
Troponin I (High Sensitivity): 7 ng/L (ref <18)

## 2023-12-02 LAB — RESP PANEL BY RT-PCR (RSV, FLU A&B, COVID)  RVPGX2
Influenza A by PCR: NEGATIVE
Influenza B by PCR: NEGATIVE
Resp Syncytial Virus by PCR: NEGATIVE
SARS Coronavirus 2 by RT PCR: NEGATIVE

## 2023-12-02 LAB — SURGICAL PATHOLOGY

## 2023-12-02 LAB — CBC
HCT: 46.8 % — ABNORMAL HIGH (ref 36.0–46.0)
Hemoglobin: 15.2 g/dL — ABNORMAL HIGH (ref 12.0–15.0)
MCH: 28.3 pg (ref 26.0–34.0)
MCHC: 32.5 g/dL (ref 30.0–36.0)
MCV: 87 fL (ref 80.0–100.0)
Platelets: 283 10*3/uL (ref 150–400)
RBC: 5.38 MIL/uL — ABNORMAL HIGH (ref 3.87–5.11)
RDW: 15.2 % (ref 11.5–15.5)
WBC: 13.5 10*3/uL — ABNORMAL HIGH (ref 4.0–10.5)
nRBC: 0 % (ref 0.0–0.2)

## 2023-12-02 LAB — BRAIN NATRIURETIC PEPTIDE: B Natriuretic Peptide: 50.6 pg/mL (ref 0.0–100.0)

## 2023-12-02 LAB — RAD ONC ARIA SESSION SUMMARY
Course Elapsed Days: 0
Plan Fractions Treated to Date: 1
Plan Prescribed Dose Per Fraction: 2.5 Gy
Plan Total Fractions Prescribed: 15
Plan Total Prescribed Dose: 37.5 Gy
Reference Point Dosage Given to Date: 2.5 Gy
Reference Point Session Dosage Given: 2.5 Gy
Session Number: 1

## 2023-12-02 LAB — ALBUMIN: Albumin: 3.2 g/dL — ABNORMAL LOW (ref 3.5–5.0)

## 2023-12-02 MED ORDER — SODIUM CHLORIDE 0.9 % IV BOLUS
1000.0000 mL | Freq: Once | INTRAVENOUS | Status: AC
  Administered 2023-12-02: 1000 mL via INTRAVENOUS

## 2023-12-02 MED ORDER — ACETAMINOPHEN 650 MG RE SUPP: 650.0000 mg | Freq: Four times a day (QID) | RECTAL | Status: DC | PRN

## 2023-12-02 MED ORDER — HYDROXYZINE HCL 10 MG PO TABS
10.0000 mg | ORAL_TABLET | Freq: Three times a day (TID) | ORAL | Status: DC | PRN
  Administered 2023-12-02 – 2023-12-03 (×2): 10 mg via ORAL
  Filled 2023-12-02 (×3): qty 1

## 2023-12-02 MED ORDER — IPRATROPIUM-ALBUTEROL 0.5-2.5 (3) MG/3ML IN SOLN
3.0000 mL | Freq: Four times a day (QID) | RESPIRATORY_TRACT | Status: DC | PRN
  Administered 2023-12-03: 3 mL via RESPIRATORY_TRACT
  Filled 2023-12-02: qty 3

## 2023-12-02 MED ORDER — ONDANSETRON HCL 4 MG/2ML IJ SOLN
4.0000 mg | Freq: Four times a day (QID) | INTRAMUSCULAR | Status: DC | PRN
  Administered 2023-12-04: 4 mg via INTRAVENOUS
  Filled 2023-12-02: qty 2

## 2023-12-02 MED ORDER — CALCIUM GLUCONATE-NACL 1-0.675 GM/50ML-% IV SOLN
1.0000 g | Freq: Once | INTRAVENOUS | Status: AC
  Administered 2023-12-02: 1000 mg via INTRAVENOUS
  Filled 2023-12-02: qty 50

## 2023-12-02 MED ORDER — HYDROCODONE-ACETAMINOPHEN 5-325 MG PO TABS
1.0000 | ORAL_TABLET | ORAL | Status: DC | PRN
  Administered 2023-12-02 – 2023-12-09 (×6): 2 via ORAL
  Filled 2023-12-02 (×6): qty 2

## 2023-12-02 MED ORDER — ENOXAPARIN SODIUM 40 MG/0.4ML IJ SOSY
40.0000 mg | PREFILLED_SYRINGE | INTRAMUSCULAR | Status: DC
  Administered 2023-12-02 – 2023-12-08 (×7): 40 mg via SUBCUTANEOUS
  Filled 2023-12-02 (×7): qty 0.4

## 2023-12-02 MED ORDER — IOHEXOL 350 MG/ML SOLN
75.0000 mL | Freq: Once | INTRAVENOUS | Status: AC | PRN
  Administered 2023-12-02: 75 mL via INTRAVENOUS

## 2023-12-02 MED ORDER — ACETAMINOPHEN 325 MG PO TABS
650.0000 mg | ORAL_TABLET | Freq: Four times a day (QID) | ORAL | Status: DC | PRN
Start: 2023-12-02 — End: 2023-12-09

## 2023-12-02 MED ORDER — ONDANSETRON HCL 4 MG PO TABS: 4.0000 mg | ORAL_TABLET | Freq: Four times a day (QID) | ORAL | Status: DC | PRN

## 2023-12-02 MED ORDER — SENNOSIDES-DOCUSATE SODIUM 8.6-50 MG PO TABS: 1.0000 | ORAL_TABLET | Freq: Every evening | ORAL | Status: DC | PRN

## 2023-12-02 MED ORDER — ALBUTEROL SULFATE (2.5 MG/3ML) 0.083% IN NEBU: 2.5000 mg | INHALATION_SOLUTION | RESPIRATORY_TRACT | Status: DC | PRN

## 2023-12-02 MED ORDER — LACTATED RINGERS IV SOLN: INTRAVENOUS | Status: AC

## 2023-12-02 MED ORDER — MOMETASONE FURO-FORMOTEROL FUM 200-5 MCG/ACT IN AERO
2.0000 | INHALATION_SPRAY | Freq: Two times a day (BID) | RESPIRATORY_TRACT | Status: DC
  Administered 2023-12-03 – 2023-12-09 (×12): 2 via RESPIRATORY_TRACT
  Filled 2023-12-02: qty 8.8

## 2023-12-02 MED ORDER — IPRATROPIUM-ALBUTEROL 0.5-2.5 (3) MG/3ML IN SOLN
3.0000 mL | Freq: Once | RESPIRATORY_TRACT | Status: AC
  Administered 2023-12-02: 3 mL via RESPIRATORY_TRACT
  Filled 2023-12-02: qty 3

## 2023-12-02 MED ORDER — SODIUM CHLORIDE 0.9% FLUSH
3.0000 mL | Freq: Two times a day (BID) | INTRAVENOUS | Status: DC
  Administered 2023-12-02 – 2023-12-07 (×10): 3 mL via INTRAVENOUS

## 2023-12-02 NOTE — ED Notes (Signed)
 Patient picked up to radiation oncology.

## 2023-12-02 NOTE — ED Provider Notes (Signed)
 [facility] AT [facility] Provider Note  CSN: [id number] Arrival date & time: 12/02/23 9176  Chief Complaint(s) Shortness of Breath and Weakness  HPI Tammy Cordova is a 58 y.o. female with past medical history as below, significant for COPD, pneumonia, lung mass who presents to the ED with complaint of dyspnea, weakness, poor p.o.  She was recently admitted to the hospital and was found to have lung mass, underwent biopsy she was scheduled follow-up with oncology today but came here instead secondary to feeling unwell.  Reports has been feeling poor since she was discharged on 12/28.  Has not been eating or drinking much, has not taken her medications in the last 2 or 3 days.  When she attempts to ambulate she becomes severely dyspneic and cannot catch her breath.  She is very fatigued and difficulty performing her ADLs over the past few days. No fever. No sick contacts.   Past Medical History Past Medical History:  Diagnosis Date   COPD (chronic obstructive pulmonary disease) (HCC)    Pneumonia    Patient Active Problem List   Diagnosis Date Noted   Mass of lung 11/25/2023   Thyroid  lesion 11/25/2023   Lung mass 11/25/2023   COPD  GOLD II, group C still smoking  07/28/2019   Acute respiratory failure with hypoxemia (HCC) 07/28/2019   CAP (community acquired pneumonia) 07/25/2019   Sepsis (HCC) 07/25/2019   Encounter for screening for HIV 07/25/2019   Cigarette smoker 07/25/2019   Headache 07/25/2019   Home Medication(s) Prior to Admission medications   Medication Sig Start Date End Date Taking? Authorizing Provider  albuterol  (PROAIR  HFA) 108 (90 Base) MCG/ACT inhaler 2 puffs every 4 hours as needed only  if your can't catch your breath 09/26/20   Darlean Ozell NOVAK, MD  alum & mag hydroxide-simeth (MAALOX/MYLANTA) 200-200-20 MG/5ML suspension Take 15 mLs by mouth every 4 (four) hours as needed for indigestion or heartburn. 11/27/23   Raenelle Coria,  MD  budesonide -formoterol  (SYMBICORT ) 160-4.5 MCG/ACT inhaler Take 2 puffs first thing in am and then another 2 puffs about 12 hours later. 09/26/20   Darlean Ozell NOVAK, MD  meloxicam (MOBIC) 15 MG tablet Take 15 mg by mouth daily.    [provider]  oxyCODONE  (OXY IR/ROXICODONE ) 5 MG immediate release tablet Take 1 tablet (5 mg total) by mouth every 4 (four) hours as needed for up to 5 days for moderate pain (pain score 4-6). 11/27/23 12/02/23  Raenelle Coria, MD  pantoprazole  (PROTONIX ) 40 MG tablet Take 40 mg by mouth every morning.    [provider]  valACYclovir (VALTREX) 500 MG tablet Take 500 mg by mouth 2 (two) times daily as needed. 07/10/20   [provider]  valsartan (DIOVAN) 320 MG tablet Take 320 mg by mouth daily.    [provider]  Past Surgical History Past Surgical History:  Procedure Laterality Date   BREAST BIOPSY Left 04/2016   REACTIVE LYMPHOID HYPERPLASIA    BUNIONECTOMY     bilateral   Family History Family History  Problem Relation Age of Onset   Breast cancer Paternal Grandmother 55   Diabetes Mother    Hypertension Mother    Diabetes Father    Hypertension Father    Cancer Father     Social History Social History   Tobacco Use   Smoking status: Every Day    Current packs/day: 0.30    Average packs/day: 0.3 packs/day for 36.0 years (10.8 ttl pk-yrs)    Types: Cigarettes   Smokeless tobacco: Never   Tobacco comments:    6 cigarettes smoked daily. 09/26/20 [facility]   Vaping Use   Vaping status: Never Used  Substance Use Topics   Alcohol use: No   Drug use: Yes    Types: Marijuana   Allergies Patient has no known allergies.  Review of Systems Review of Systems  Constitutional:  Positive for appetite change and fatigue.  Respiratory:  Positive for cough and shortness of breath.    Cardiovascular:  Negative for chest pain.  Gastrointestinal:  Negative for abdominal pain, nausea and vomiting.  Genitourinary:  Negative for dysuria.  Musculoskeletal:  Positive for arthralgias.  Neurological:  Negative for tremors and numbness.  All other systems reviewed and are negative.   Physical Exam Vital Signs  I have reviewed the triage vital signs BP 129/70   Pulse (!) 111   Temp 98.6 F (37 C)   Resp (!) 23   Ht 5' 6 (1.676 m)   Wt 70.3 kg   LMP  (LMP Unknown)   SpO2 96%   BMI 25.02 kg/m  Physical Exam Vitals and nursing note reviewed.  Constitutional:      General: She is not in acute distress.    Appearance: Normal appearance. She is well-developed. She is not ill-appearing.  HENT:     Head: Normocephalic and atraumatic.     Right Ear: External ear normal.     Left Ear: External ear normal.     Nose: Nose normal.     Mouth/Throat:     Mouth: Mucous membranes are moist.  Eyes:     General: No scleral icterus.       Right eye: No discharge.        Left eye: No discharge.  Cardiovascular:     Rate and Rhythm: Regular rhythm. Tachycardia present.     Pulses: Normal pulses.     Heart sounds: Normal heart sounds.  Pulmonary:     Effort: Pulmonary effort is normal. Tachypnea present. No respiratory distress.     Breath sounds: No stridor. Wheezing present. No decreased breath sounds.  Abdominal:     General: Abdomen is flat. There is no distension.     Palpations: Abdomen is soft.     Tenderness: There is no abdominal tenderness.  Musculoskeletal:     Cervical back: No rigidity.     Right lower leg: No edema.     Left lower leg: No edema.  Skin:    General: Skin is warm and dry.     Capillary Refill: Capillary refill takes less than 2 seconds.  Neurological:     Mental Status: She is alert.  Psychiatric:        Mood and Affect: Mood normal.        Behavior: Behavior normal. Behavior is cooperative.  ED Results and Treatments Labs (all  labs ordered are listed, but only abnormal results are displayed) Labs Reviewed  BASIC METABOLIC PANEL - Abnormal; Notable for the following components:      Result Value   Sodium 133 (*)    CO2 16 (*)    Glucose, Bld 142 (*)    BUN 22 (*)    Creatinine, Ser 1.12 (*)    Calcium  6.2 (*)    GFR, Estimated 58 (*)    All other components within normal limits  CBC - Abnormal; Notable for the following components:   WBC 13.5 (*)    RBC 5.38 (*)    Hemoglobin 15.2 (*)    HCT 46.8 (*)    All other components within normal limits  ALBUMIN - Abnormal; Notable for the following components:   Albumin 3.2 (*)    All other components within normal limits  RESP PANEL BY RT-PCR (RSV, FLU A&B, COVID)  RVPGX2  BRAIN NATRIURETIC PEPTIDE  TROPONIN I (HIGH SENSITIVITY)  TROPONIN I (HIGH SENSITIVITY)                                                                                                                          Radiology DG Chest 2 View Result Date: 12/02/2023 CLINICAL DATA:  Shortness of breath. EXAM: CHEST - 2 VIEW COMPARISON:  December 20, 2021. FINDINGS: The heart size and mediastinal contours are within normal limits. Minimal bibasilar subsegmental atelectasis is noted. The visualized skeletal structures are unremarkable. IMPRESSION: Minimal bibasilar subsegmental atelectasis. Electronically Signed   By: Lynwood Landy Raddle M.D.   On: 12/02/2023 10:19    Pertinent labs & imaging results that were available during my care of the patient were reviewed by me and considered in my medical decision making (see MDM for details).  Medications Ordered in ED Medications  albuterol  (PROVENTIL ) (2.5 MG/3ML) 0.083% nebulizer solution 2.5 mg (has no administration in time range)  ipratropium-albuterol  (DUONEB) 0.5-2.5 (3) MG/3ML nebulizer solution 3 mL (has no administration in time range)  calcium  gluconate 1 g/ 50 mL sodium chloride  IVPB (1,000 mg Intravenous New Bag/Given 12/02/23 1530)  sodium chloride   0.9 % bolus 1,000 mL (1,000 mLs Intravenous New Bag/Given 12/02/23 1530)  iohexol  (OMNIPAQUE ) 350 MG/ML injection 75 mL (75 mLs Intravenous Contrast Given 12/02/23 1536)  Procedures .Critical Care  Performed by: Elnor Jayson LABOR, DO Authorized by: Elnor Jayson LABOR, DO   Critical care provider statement:    Critical care time (minutes):  32   Critical care time was exclusive of:  Separately billable procedures and treating other patients   Critical care was necessary to treat or prevent imminent or life-threatening deterioration of the following conditions:  Respiratory failure   Critical care was time spent personally by me on the following activities:  Development of treatment plan with patient or surrogate, discussions with consultants, evaluation of patient's response to treatment, examination of patient, ordering and review of laboratory studies, ordering and review of radiographic studies, ordering and performing treatments and interventions, pulse oximetry, re-evaluation of patient's condition, review of old charts and obtaining history from patient or surrogate   (including critical care time)  Medical Decision Making / ED Course    Medical Decision Making:    Tammy Cordova is a 58 y.o. female with past medical history as below, significant for COPD, pneumonia, lung mass who presents to the ED with complaint of dyspnea, weakness, poor p.o.[name] The complaint involves an extensive differential diagnosis and also carries with it a high risk of complications and morbidity.  Serious etiology was considered. Ddx includes but is not limited to: In my evaluation of this patient's dyspnea my DDx includes, but is not limited to, pneumonia, pulmonary embolism, pneumothorax, pulmonary edema, metabolic acidosis, asthma, COPD, cardiac cause, anemia, anxiety, etc.    Complete  initial physical exam performed, notably the patient was in hypoxia noted in triage, placed on nasal cannula 3 L with improvement.  She has tachycardia.    Reviewed and confirmed nursing documentation for past medical history, family history, social history.  Vital signs reviewed.      Clinical Course as of 12/02/23 1750  Thu Dec 02, 2023  1559 Spoke with Dr. Timmy, they will see in consult, patient may require emergent chemotherapy.  He will see in the morning to make final decision [SG]  1725 Assumed care from Dr Elnor. 58 yo F with hx of lung cancer and COPD who presented with hypoxia and has been generally fatigued over several days. Has new Oxygen requirement of 2L. Also tachycardic. Has hypocalcemia and got IV Ca. Awaiting CT PE. Oncology would like for her to be admitted.  [RP]    Clinical Course User Index [RP] Yolande Lamar BROCKS, MD [SG] Elnor Jayson LABOR, DO    Brief summary: 58 year old female recently had following lung mass presumed to be stage IV carcinoma presents to the ER today secondary to dyspnea, fatigue, poor p.o. last few days.  Hypoxia in triage to 89% on room air, improved to 95% on 2 L nasal cannula.  She has tachycardia, will give IV fluids.  Leukocytosis 13.5.  Chest x-ray with atelectasis, she has hypoxia tachycardia.  Recent hospitalization.  Newly diagnosed lung cancer.  Will get CT PE.  Also has hypocalcemia, replaced with IV calcium .  Patient pending CT imaging at time of shift change.  She will likely require admission.  Discussed with oncology outlined above.  Handoff to incoming EDP pending imaging and admission              Additional history obtained: -Additional history obtained from family -External records from outside source obtained and reviewed including: Chart review including previous notes, labs, imaging, consultation notes including  Recent admission, home medications, prior labs and imaging   Lab Tests: -I ordered, reviewed, and  interpreted  labs.   The pertinent results include:   Labs Reviewed  BASIC METABOLIC PANEL - Abnormal; Notable for the following components:      Result Value   Sodium 133 (*)    CO2 16 (*)    Glucose, Bld 142 (*)    BUN 22 (*)    Creatinine, Ser 1.12 (*)    Calcium  6.2 (*)    GFR, Estimated 58 (*)    All other components within normal limits  CBC - Abnormal; Notable for the following components:   WBC 13.5 (*)    RBC 5.38 (*)    Hemoglobin 15.2 (*)    HCT 46.8 (*)    All other components within normal limits  ALBUMIN - Abnormal; Notable for the following components:   Albumin 3.2 (*)    All other components within normal limits  RESP PANEL BY RT-PCR (RSV, FLU A&B, COVID)  RVPGX2  BRAIN NATRIURETIC PEPTIDE  TROPONIN I (HIGH SENSITIVITY)  TROPONIN I (HIGH SENSITIVITY)    Notable for leukocytosis, hypocalcemia  EKG   EKG Interpretation Date/Time:  Thursday December 02 2023 13:39:41 EST Ventricular Rate:  119 PR Interval:  143 QRS Duration:  93 QT Interval:  347 QTC Calculation: 489 R Axis:   70  Text Interpretation: Sinus tachycardia Nonspecific T abnormalities, lateral leads Borderline prolonged QT interval Confirmed by Elnor Savant ([id number]) on 12/02/2023 5:50:23 PM         Imaging Studies ordered: I ordered imaging studies including x-ray, CT PE I independently visualized the following imaging with scope of interpretation limited to determining acute life threatening conditions related to emergency care; findings noted above I independently visualized and interpreted imaging. I agree with the radiologist interpretation   Medicines ordered and prescription drug management: Meds ordered this encounter  Medications   albuterol  (PROVENTIL ) (2.5 MG/3ML) 0.083% nebulizer solution 2.5 mg   calcium  gluconate 1 g/ 50 mL sodium chloride  IVPB   sodium chloride  0.9 % bolus 1,000 mL   iohexol  (OMNIPAQUE ) 350 MG/ML injection 75 mL   ipratropium-albuterol  (DUONEB) 0.5-2.5 (3)  MG/3ML nebulizer solution 3 mL    -I have reviewed the patients home medicines and have made adjustments as needed   Consultations Obtained: I requested consultation with the oncology,  and discussed lab and imaging findings as well as pertinent plan - they recommend: eval in am, eval for urgent chemo   Cardiac Monitoring: The patient was maintained on a cardiac monitor.  I personally viewed and interpreted the cardiac monitored which showed an underlying rhythm of: sinus tachy Continuous pulse oximetry interpreted by myself, 95% % on 2 L.    Social Determinants of Health:  Diagnosis or treatment significantly limited by social determinants of health: former smoker   Reevaluation: After the interventions noted above, I reevaluated the patient and found that they have improved  Co morbidities that complicate the patient evaluation  Past Medical History:  Diagnosis Date   COPD (chronic obstructive pulmonary disease) (HCC)    Pneumonia       Dispostion: Disposition decision including need for hospitalization was considered, and patient disposition pending at time of sign out.    Final Clinical Impression(s) / ED Diagnoses Final diagnoses:  Hypocalcemia  Generalized weakness  Respiratory distress        Elnor Savant LABOR, DO 12/02/23 1750

## 2023-12-02 NOTE — Progress Notes (Signed)
 I called and spoke with the patient and her husband to let them know Dr. Patrcia is covering for Dr. Dewey this week and thinks we should proceed urgently to start her radiation today rather than wait until next week. She is in agreement with this and will be treated this afternoon. Dr. Timmy will also see her tomorrow to consider inpatient chemotherapy.

## 2023-12-02 NOTE — Progress Notes (Addendum)
 Tammy Cordova   DOB:1965-12-03   FM#:[id number]      ASSESSMENT & PLAN:  1.  Right upper lobe lung mass, likely lung neoplasm with mets - CT scan of chest and neck on 11/25/2023 demonstrated right upper lobe lung mass 6.6 cm invading mediastinum and displacing trachea and esophagus.  Multiple lymph nodes in the cervical supraclavicular and axillary areas.  Findings compatible with metastatic adenopathy - MRI done previous admission December 2024 shows enhancing mass in the right lung invading mediastinum.  No evidence of mets in thoracic spine Diagnostic testing and findings concerning for primary lung neoplasm with mets - Medical oncology/Dr. Timmy following patient.  Patient had outpatient appointment today 12/02/23 to discuss treatment planning however patient has ended up now readmitted via ED. - Dr. Timmy will continue to follow patient during this admission.  2.  Failure to thrive - Patient complains of generalized weakness, inability to sleep, lack of appetite, fatigue - Provide supportive care - Recommend IV hydration with electrolyte replacement as needed - Monitor CMP closely   3.  Left thyroid  lobe lesion - CT scan of chest done at last admission December 2024 showed a 1.5 cm hypodense left thyroid  lobe lesion - Nonemergent thyroid  U/S recommended  4.  History of RA - Status post methotrexate in the past, has not taken for a year - Follow with rheumatologist as outpatient  5.  Tobacco use disorder - Patient with 40-year history of tobacco use since age 19.  Reportedly quit a couple weeks ago - Encouraged tobacco cessation  Subjective:  Patient seen awake and alert sitting up in chair in the emergency department.  Spouse at bedside.  Patient reports that since discharge from hospital to home on 11/27/2023, she has felt increasingly weak, has had no appetite and has not been eating, unable to sleep and that she is up most of the night.  Also reports fatigue and no  energy. Indeed patient is seen and assessed today appears frail and weak appearing.  Denies acute pain, nausea vomiting diarrhea.  Objective:  Vitals:   12/02/23 0841 12/02/23 0844  BP:  127/75  Pulse:  (!) 118  Resp:  15  Temp:  98 F (36.7 C)  SpO2: 97% 95%    No intake or output data in the 24 hours ending 12/02/23 0958   REVIEW OF SYSTEMS:   Constitutional: +Fatigue, +insomnia,  denies fevers, chills or abnormal night sweats Eyes: Denies blurriness of vision, double vision or watery eyes Ears, nose, mouth, throat, and face: Denies mucositis or sore throat Respiratory: Denies cough, dyspnea or wheezes Cardiovascular: Denies palpitation, chest discomfort or lower extremity swelling Gastrointestinal:  Denies nausea, heartburn or change in bowel habits  +poor appetite Skin: Denies abnormal skin rashes Lymphatics: Denies new lymphadenopathy or easy bruising Neurological: Denies numbness, tingling or new weaknesses Behavioral/Psych: Mood is stable, no new changes  All other systems were reviewed with the patient and are negative.  PHYSICAL EXAMINATION: ECOG PERFORMANCE STATUS: 3 - Symptomatic, >50% confined to bed  Vitals:   12/02/23 0841 12/02/23 0844  BP:  127/75  Pulse:  (!) 118  Resp:  15  Temp:  98 F (36.7 C)  SpO2: 97% 95%   Filed Weights   12/02/23 0842  Weight: 155 lb (70.3 kg)    GENERAL: alert, + frail and weak appearing  SKIN: skin color, texture, turgor are normal, no rashes or significant lesions EYES: normal, conjunctiva are pink and non-injected, sclera clear OROPHARYNX: no exudate, no erythema  and lips, buccal mucosa, and tongue normal  NECK: supple, thyroid  normal size, non-tender, without nodularity LYMPH: no palpable lymphadenopathy in the cervical, axillary or inguinal LUNGS: clear to auscultation and percussion with normal breathing effort HEART: regular rate & rhythm and no murmurs and no lower extremity edema ABDOMEN: abdomen soft,  non-tender and normal bowel sounds MUSCULOSKELETAL: no cyanosis of digits and no clubbing  PSYCH: alert & oriented x 3 with fluent speech NEURO: no focal motor/sensory deficits   All questions were answered. The patient knows to call the clinic with any problems, questions or concerns.   The total time spent in the appointment was 40 minutes encounter with patient including review of chart and various tests results, discussions about plan of care and coordination of care plan  Olam JINNY Brunner, NP 12/02/2023 9:58 AM    Labs Reviewed:  Lab Results  Component Value Date   WBC PENDING 12/02/2023   HGB 15.2 (H) 12/02/2023   HCT 46.8 (H) 12/02/2023   MCV 87.0 12/02/2023   PLT 283 12/02/2023   Recent Labs    11/25/23 1319 11/26/23 0526  NA 139 133*  K 3.9 3.8  CL 103 105  CO2  --  22  GLUCOSE 114* 84  BUN 27* 19  CREATININE 1.60* 0.69  CALCIUM   --  8.0*  GFRNONAA  --  >60    Studies Reviewed:  NM PET Image Initial (PI) Skull Base To Thigh Result Date: 12/01/2023 CLINICAL DATA:  Initial treatment strategy for non-small-cell lung cancer. EXAM: NUCLEAR MEDICINE PET SKULL BASE TO THIGH TECHNIQUE: 7.7 mCi F-18 FDG was injected intravenously. Full-ring PET imaging was performed from the skull base to thigh after the radiotracer. CT data was obtained and used for attenuation correction and anatomic localization. Fasting blood glucose: 117 mg/dl COMPARISON:  Abdomen and pelvis CT 11/27/2023.  Chest CT 11/25/2023 FINDINGS: Mediastinal blood pool activity: SUV max 2.4 Liver activity: SUV max NA NECK: Tiny lymph nodes in the right lower neck show low level uptake, potentially metastatic. Incidental CT findings: None. CHEST: Medial right upper lung mass is markedly hypermetabolic with SUV max = 9.3 with associated hypermetabolic central nodularity in the right suprahilar lung. 2.4 cm short axis right supraclavicular lymphadenopathy is hypermetabolic with SUV max = 6.9. Focal uptake identified along  the posterior aspect of the inferior left thyroid  lobe. Noncontrast CT imaging shows a an apparent 15 mm posterior left thyroid  nodule on 47/4. SUV max = 6.7. Hypermetabolic mediastinal and hilar lymph nodes evident. Small lymph nodes in the axillary regions bilaterally are FDG avid with SUV max = 5.6 on the left and 5.1 on the right. 12 mm subpleural lower left lung nodule on 89/4 is hypermetabolic with SUV max = 7.5. Incidental CT findings: Enlargement of the pulmonary outflow tract and main pulmonary arteries suggests pulmonary arterial hypertension. Mild atherosclerotic calcification is noted in the wall of the thoracic aorta. Centrilobular and paraseptal emphysema evident. ABDOMEN/PELVIS: No abnormal hypermetabolic activity within the liver, pancreas, adrenal glands, or spleen. No hypermetabolic lymph nodes in the abdomen. Bilateral external iliac and groin lymphadenopathy noted. A 12 mm short axis left external iliac node on 173/4 is hypermetabolic with SUV max = 5.2. 13 mm short axis right external iliac node on 174/4 is hypermetabolic with SUV max = 4.6. Lymph nodes in the groin regions show low level hypermetabolism ( SUV max = 3.0). Incidental CT findings: None. SKELETON: Radiotracer accumulation in the marrow space is diffusely mottled. No definite bony metastatic involvement. 1  particular focus of uptake in the posterior right iliac crest demonstrates SUV max = 4.7 with no underlying CT abnormality (corresponding to image 149/4). Incidental CT findings: None. IMPRESSION: 1. Markedly hypermetabolic medial right upper lung mass with associated hypermetabolic central nodularity in the right suprahilar lung. Imaging features compatible with primary bronchogenic carcinoma. 2. Hypermetabolic right supraclavicular, mediastinal, hilar, and axillary lymphadenopathy consistent with metastatic disease. Hypermetabolic external iliac lymphadenopathy identified bilaterally in the pelvis with mildly hypermetabolic  groin lymphadenopathy. Metastatic disease not excluded. 3. 12 mm subpleural lower left lung nodule is hypermetabolic and compatible with metastatic disease or synchronous primary neoplasm. 4. Hypermetabolic 15 mm posterior left thyroid  nodule on noncontrast CT imaging. Recommend thyroid  US  and biopsy (ref: J Am Coll Radiol. 2015 Feb;12(2): 143-50). 5. Enlargement of the pulmonary outflow tract and main pulmonary arteries suggests pulmonary arterial hypertension. 6. Aortic Atherosclerosis (ICD10-I70.0) and Emphysema (ICD10-J43.9). Electronically Signed   By: Camellia Candle M.D.   On: 12/01/2023 09:44   CT ABDOMEN PELVIS W CONTRAST Result Date: 11/27/2023 CLINICAL DATA:  Non-small cell lung cancer (NSCLC), Staging for lung cancer * Tracking Code: BO * EXAM: CT ABDOMEN AND PELVIS WITH CONTRAST TECHNIQUE: Multidetector CT imaging of the abdomen and pelvis was performed using the standard protocol following bolus administration of intravenous contrast. RADIATION DOSE REDUCTION: This exam was performed according to the departmental dose-optimization program which includes automated exposure control, adjustment of the mA and/or kV according to patient size and/or use of iterative reconstruction technique. CONTRAST:  OMNIPAQUE  IOHEXOL  300 MG/ML  SOLN COMPARISON:  None Available. FINDINGS: Lower chest: There are emphysematous changes in the visualized bilateral lungs. There also patchy areas of atelectasis/scarring. No mass or consolidation. No pleural or pericardial effusion. Normal cardiac size. Hepatobiliary: The liver is normal in size. Non-cirrhotic configuration. No suspicious mass. These is mild diffuse hepatic steatosis. No intrahepatic or extrahepatic bile duct dilation. No calcified gallstones. Normal gallbladder wall thickness. No pericholecystic inflammatory changes. Pancreas: Unremarkable. No pancreatic ductal dilatation or surrounding inflammatory changes. Spleen: Within normal limits. No focal lesion.  Adrenals/Urinary Tract: There is an indeterminate 8 x 10 mm right adrenal nodule, which can not be characterized as an adenoma. However, this nodule was present on the prior study from 07/27/2019. Unremarkable left adrenal gland. No suspicious renal mass. There is a subcentimeter sized hypoattenuating structure in the right kidney interpolar region, laterally, which is too small to adequately characterize. No hydronephrosis. No renal or ureteric calculi. Unremarkable urinary bladder. Stomach/Bowel: No disproportionate dilation of the small or large bowel loops. No evidence of abnormal bowel wall thickening or inflammatory changes. The appendix is unremarkable. Vascular/Lymphatic: No ascites or pneumoperitoneum. No abdominal or pelvic lymphadenopathy, by size criteria. No aneurysmal dilation of the major abdominal arteries. There are mild peripheral atherosclerotic vascular calcifications of the aorta and its major branches. Reproductive: Retroverted lobulated uterus containing several ill-defined hyperattenuating areas, favored to represent leiomyomas. No large adnexal mass seen. Other: There is a tiny fat containing umbilical hernia. The soft tissues and abdominal wall are otherwise unremarkable. Musculoskeletal: No suspicious osseous lesions. There are mild multilevel degenerative changes in the visualized spine. IMPRESSION: 1. No metastatic lung carcinoma seen within the abdomen or pelvis. 2. Multiple other nonacute observations, as described above. Electronically Signed   By: Ree Molt M.D.   On: 11/27/2023 20:44   MR BRAIN W WO CONTRAST Result Date: 11/26/2023 CLINICAL DATA:  Non-small cell lung cancer, staging EXAM: MRI HEAD WITHOUT AND WITH CONTRAST TECHNIQUE: Multiplanar, multiecho pulse sequences of the  brain and surrounding structures were obtained without and with intravenous contrast. CONTRAST:  7mL GADAVIST  GADOBUTROL  1 MMOL/ML IV SOLN COMPARISON:  None Available. FINDINGS: Brain: No restricted  diffusion to suggest acute or subacute infarct. No abnormal parenchymal or meningeal enhancement. No acute hemorrhage, mass, mass effect, or midline shift. No hydrocephalus or extra-axial collection. Pituitary and craniocervical junction within normal limits. No hemosiderin deposition to suggest remote hemorrhage. Normal cerebral volume for age. Vascular: Normal arterial flow voids. Normal arterial and venous enhancement. Skull and upper cervical spine: Normal marrow signal. Sinuses/Orbits: Clear paranasal sinuses. No acute finding in the orbits. Other: The mastoid air cells are well aerated. IMPRESSION: No acute intracranial process. No evidence of intracranial metastatic disease. Electronically Signed   By: Donald Campion M.D.   On: 11/26/2023 22:28   US  CORE BIOPSY (LYMPH NODES) Result Date: 11/26/2023 INDICATION: 58 year old woman with right upper lobe lung mass and right neck lymphadenopathy presents to IR for ultrasound-guided biopsy of right supraclavicular lymph node. EXAM: Ultrasound-guided biopsy of right supraclavicular lymph node MEDICATIONS: None. ANESTHESIA/SEDATION: None COMPLICATIONS: None immediate. PROCEDURE: Informed written consent was obtained from the patient after a thorough discussion of the procedural risks, benefits and alternatives. All questions were addressed. Maximal Sterile Barrier Technique was utilized including caps, mask, sterile gowns, sterile gloves, sterile drape, hand hygiene and skin antiseptic. A timeout was performed prior to the initiation of the procedure. Patient position supine on the ultrasound table. Right neck skin prepped and draped in usual sterile fashion. Following local lidocaine  administration, 6- 18 gauge cores were obtained from the enlarged right supraclavicular lymph node utilizing continuous ultrasound guidance. Samples were sent to pathology in formalin. Needle removed and hemostasis achieved with 2 minutes of manual compression. Post procedure  ultrasound images showed no evidence of significant hemorrhage. IMPRESSION: Successful ultrasound-guided biopsy of right supraclavicular lymph node. Electronically Signed   By: Aliene Lloyd M.D.   On: 11/26/2023 16:48   MR THORACIC SPINE W WO CONTRAST Result Date: 11/25/2023 CLINICAL DATA:  Metastatic disease evaluation EXAM: MRI THORACIC WITHOUT AND WITH CONTRAST TECHNIQUE: Multiplanar and multiecho pulse sequences of the thoracic spine were obtained without and with intravenous contrast. CONTRAST:  7mL GADAVIST  GADOBUTROL  1 MMOL/ML IV SOLN COMPARISON:  No prior MRI of the thoracic spine available, correlation is made with CT chest 11/25/2023 FINDINGS: Alignment: No listhesis. Preservation of the normal thoracic kyphosis. Vertebrae: No acute fracture, evidence of discitis, or suspicious osseous lesion. No abnormal enhancement in the osseous structures. Cord:  Normal signal and morphology.  No abnormal enhancement. Paraspinal and other soft tissues: Enhancing mass in the right lung, invading the mediastinum, which was better evaluated on the 11/25/2023 CT chest. This mass is adjacent to the anterior right aspect of the C7-T4 vertebral bodies, but the cortical signal appears preserved, and no definite osseous invasion is seen. The mass appears to invade the right neural foramen at T1-T2 (series 24, images 4-5). Disc levels: No significant spinal canal stenosis or osseous neural foraminal narrowing. As described above, the enhancing mass appears to invade the right neural foramen at T1-T2, and may abut the exiting right T1 nerve roots (series 18, image 6). IMPRESSION: 1. Enhancing mass in the right lung, invading the mediastinum, which was better evaluated on the 11/25/2023 CT chest. This mass is adjacent to the anterior right aspect of the C7-T4 vertebral bodies, but the cortical signal appears preserved, and no definite osseous invasion is seen. The mass appears to invade the right neural foramen at T1-T2, and  may abut the exiting right T1 nerve roots. 2. No evidence of metastatic disease in the thoracic spine. Electronically Signed   By: Donald Campion M.D.   On: 11/25/2023 20:52   CT CHEST WO CONTRAST Result Date: 11/25/2023 CLINICAL DATA:  Mediastinal mass EXAM: CT CHEST WITHOUT CONTRAST TECHNIQUE: Multidetector CT imaging of the chest was performed following the standard protocol without IV contrast. RADIATION DOSE REDUCTION: This exam was performed according to the departmental dose-optimization program which includes automated exposure control, adjustment of the mA and/or kV according to patient size and/or use of iterative reconstruction technique. COMPARISON:  CT neck 11/25/2023 FINDINGS: Cardiovascular: Atheromatous vascular calcification in the aortic arch and left anterior descending coronary artery. Moderate cardiomegaly. Mediastinum/Nodes: Right lower neck level V lymph node measures 2.4 cm in short axis on image 30 series 2. Right supraclavicular node 0.9 cm in short axis on image 6 series 2. Index left axillary node 1.2 cm in short axis on image 11 series 2. Index right axillary node 1.2 cm in short axis on image 16 series 2. There is a right upper lobe mass invading the mediastinum and displacing the trachea and esophagus to the left. 1.5 cm hypodense left thyroid  lobe lesion on image 6 series 2. Lungs/Pleura: Presumed right upper lobe lung mass invading the mediastinum measures 6.6 by 4.5 cm on image 11 series 2, and displaces the trachea and esophagus to the left. Adjacent right suprahilar nodularity including a 3.1 by 1.8 cm tangential mass on image 43 series 4. The B1A apical segmental bronchus is occluded. Mild atelectasis along both diaphragms. Centrilobular emphysema. Mild airspace opacity posteriorly in the right upper lobe on image 64 series 4, probably from atelectasis. Upper Abdomen: Unremarkable Musculoskeletal: Unremarkable IMPRESSION: 1. Presumed right upper lobe lung mass invading the  mediastinum and displacing the trachea and esophagus to the left, measuring 6.6 cm in long axis. Adjacent right suprahilar nodularity including a 3.1 by 1.8 cm tangential mass. The B1A apical segmental bronchus is occluded. 2. Right lower neck level V lymph node measures 2.4 cm in short axis. Right supraclavicular node 0.9 cm in short axis. Index bilateral axillary nodes are mildly enlarged. Findings are compatible with metastatic adenopathy. 3. 1.5 cm hypodense left thyroid  lobe lesion. Recommend thyroid  US  (ref: J Am Coll Radiol. 2015 Feb;12(2): 143-50). 4. Moderate cardiomegaly. 5. Aortic and coronary atherosclerosis. Aortic Atherosclerosis (ICD10-I70.0) and Emphysema (ICD10-J43.9). Electronically Signed   By: Ryan Salvage M.D.   On: 11/25/2023 17:07   CT Soft Tissue Neck W Contrast Result Date: 11/25/2023 CLINICAL DATA:  Neck mass, non pulsatile lymph node. Patient reports a palpable knot in the right side his neck. EXAM: CT NECK WITH CONTRAST TECHNIQUE: Multidetector CT imaging of the neck was performed using the standard protocol following the bolus administration of intravenous contrast. RADIATION DOSE REDUCTION: This exam was performed according to the departmental dose-optimization program which includes automated exposure control, adjustment of the mA and/or kV according to patient size and/or use of iterative reconstruction technique. CONTRAST:  60mL OMNIPAQUE  IOHEXOL  300 MG/ML  SOLN COMPARISON:  CT cervical spine without contrast of the same day. FINDINGS: Pharynx and larynx: Mild adenoid hypertrophy is noted. No discrete lesion is present. The oropharynx is within normal limits. Tongue base is. Vallecula and epiglottis are within normal limits. Aryepiglottic folds and piriform sinuses are clear. Vocal cords are midline and symmetric. Trachea is clear. Salivary glands: The submandibular and parotid glands and ducts are within normal limits. Thyroid : Heterogeneous nodule the inferior left lobe  measures 2.2 x 2.2 cm on sagittal images. Lymph nodes: A right supraclavicular nodal mass measures 3.6 x 2.4 x 3.7 cm. A right level 4 heterogeneous node measures 15 mm. No significant upper cervical adenopathy is present. A heterogeneous right paraspinous mass begins at the level of C6-7 and extends into the mediastinum. Tumor surrounds the proximal first rib. Tumor extends into the right T1-2 foramen. No discrete osseous destruction is present. Vascular: No significant vascular calcifications are present. Limited intracranial: Within normal limits. Visualized orbits: The globes and orbits are within normal limits. Mastoids and visualized paranasal sinuses: The paranasal sinuses and mastoid air cells are clear. Skeleton: Multilevel degenerative changes are present in the cervical spine. Uncovertebral spurring contributes to right foraminal stenosis at C6-7. No focal osseous lesions are present. Upper chest: Extensive centrilobular emphysematous changes are present. IMPRESSION: 1. Heterogeneous right paraspinous mass begins at the level of C6-7 and extends into the mediastinum. Tumor surrounds the proximal first rib. Tumor extends into the right T1-2 foramen. This is most concerning for a lung primary neoplasm. Recommend CT of the chest with and without contrast as well as MRI of the thoracic spine without and with contrast 2. Right supraclavicular nodal mass measures 3.6 x 2.4 x 3.7 cm, consistent with metastatic disease. 3. Right level 4 heterogeneous node measures 15 mm. 4. Heterogeneous nodule the inferior left lobe measures 2.2 x 2.2 cm on sagittal images. Recommend non-emergent thyroid  ultrasound. Reference: J Am Coll Radiol. 2015 Feb;12(2): 143-50 5. Multilevel degenerative changes in the cervical spine. 6.  Emphysema (ICD10-J43.9). Electronically Signed   By: Lonni Necessary M.D.   On: 11/25/2023 16:19   CT Cervical Spine Wo Contrast Result Date: 11/25/2023 CLINICAL DATA:  Cervical radiculopathy,  infection suspected, no prior imaging EXAM: CT CERVICAL SPINE WITHOUT CONTRAST TECHNIQUE: Multidetector CT imaging of the cervical spine was performed without intravenous contrast. Multiplanar CT image reconstructions were also generated. RADIATION DOSE REDUCTION: This exam was performed according to the departmental dose-optimization program which includes automated exposure control, adjustment of the mA and/or kV according to patient size and/or use of iterative reconstruction technique. COMPARISON:  None Available. FINDINGS: Alignment: Facet joints are aligned without dislocation or traumatic listhesis. Dens and lateral masses are aligned. Skull base and vertebrae: No acute fracture of the cervical spine. Heterogeneity of the osseous structures although no well-defined lytic or sclerotic bone lesion. Soft tissues and spinal canal: No prevertebral fluid or swelling. No visible canal hematoma. Disc levels: Mild degenerative endplate spurring. Slight disc height loss at C6-7. Minimal facet joint spurring. Upper chest: Right supraclavicular mass and partially imaged superior mediastinal mass. 1.7 cm left thyroid  lobe nodule. Other: None. IMPRESSION: 1. No acute fracture or traumatic listhesis of the cervical spine. 2. Right supraclavicular mass and partially imaged superior mediastinal mass. Attention on forthcoming CT of the neck. Contrast enhanced CT of the chest is recommended to more fully assess the mediastinal mass. 3. 1.7 cm left thyroid  lobe nodule. Recommend nonemergent thyroid  US  (ref: J Am Coll Radiol. 2015 Feb;12(2): 143-50). Electronically Signed   By: Mabel Converse D.O.   On: 11/25/2023 15:32   ADDENDUM: I saw and examined Ms. Milewski in the ER.  I agree with the above note by Olam.  The problem is that she has a lot of bulky disease in the mediastinum.  I realize that she has extensive stage disease by her PET scan.  We are really going to need to get started on treatment for her.  We would  need  to start decreasing this mediastinal bulk.  She is at risk for SVC syndrome.  I know she is starting radiotherapy.  Given that this is small cell lung cancer, she should have a pretty quick response to chemotherapy.  I will try to get chemotherapy started over the weekend if possible.  We also need to try to get a Port-A-Cath in her today.  If, for some reason the Port-A-Cath cannot be placed, then a PICC line probably should help us .  Again, we should really get treatment started over the weekend so we started to get some clear regression of her malignancy before it starts to cause more problems for her.  She has already had the MRI of the brain which did not show any evidence of CNS disease.  Again, the bulk of her disease is in the mediastinum and right hilar region.  She was with her husband this morning in the ER.  I talked him about the situation.  I gave her the diagnosis.  I explained why we needed both radiation and chemotherapy.  I am glad that Radiation Oncology was able to get her started on treatment quickly.  I will try to get her up to 6 E. so we can get treatment started on her over the weekend.  Her labs today show white cell count of 10.4.  Hemoglobin 12.8.  Platelet count 240,000.  Of note, her calcium  is only 5.7.  She believes have some additional calcium .  Her BUN is 40 creatinine 1.24.  Her albumin is 2.4.  Sodium 138.  Potassium 4.8.  Again, she has extensive stage small cell lung cancer.  However, the vast majority of her disease is in the thoracic cavity.  Hopefully, we can get a PICC line or at least some central catheter in today so we get started on treatment tomorrow.  We will follow along closely.    Jeralyn Crease, MD  [place] 1:5

## 2023-12-02 NOTE — Progress Notes (Signed)
 Pathology posted and reviewed with Dr Timmy. Path shows Small Cell Lung Cancer.   Per Dr Timmy, request for [facility] One testing sent on specimen [id number] DOS 11/26/2023.  Oncology Nurse Navigator Documentation     12/02/2023   11:30 AM  Oncology Nurse Navigator Flowsheets  Confirmed Diagnosis Date 11/26/2023  Diagnosis Status Pending Molecular Studies  Navigator Follow Up Date: 12/07/2023  Navigator Follow Up Reason: Appointment Review  Navigator Location [facility]  Navigator Encounter Type Molecular Studies  Patient Visit Type MedOnc  Treatment Phase Pre-Tx/Tx Discussion  Barriers/Navigation Needs Coordination of Care  Interventions Coordination of Care  Acuity Level 2-Minimal Needs (1-2 Barriers Identified)  Coordination of Care Pathology  Time Spent with Patient 30

## 2023-12-02 NOTE — ED Provider Triage Note (Signed)
 Emergency Medicine Provider Triage Evaluation Note  ANWITHA MAPES , a 58 y.o. female  was evaluated in triage.  Pt complains of SOB and generalized weakness.  Review of Systems  Positive:  Negative:   Physical Exam  BP 127/75 (BP Location: Left Arm)   Pulse (!) 118   Temp 98 F (36.7 C) (Oral)   Resp 15   Ht 5' 6 (1.676 m)   Wt 70.3 kg   LMP  (LMP Unknown)   SpO2 95%   BMI 25.02 kg/m  Gen:   Awake, no distress   Resp:  Normal effort  MSK:   Moves extremities without difficulty  Other:    Medical Decision Making  Medically screening exam initiated at 10:10 AM.  Appropriate orders placed.  JALEAH LEFEVRE was informed that the remainder of the evaluation will be completed by another provider, this initial triage assessment does not replace that evaluation, and the importance of remaining in the ED until their evaluation is complete.  SOB and generalized weakness that has been progressing over the past 3 days. O2 93% on RA. Hx COPD.  Denies fever, chest pain, cough, nausea, vomiting, diarrhea, dysuria, hematuria, hematochezia.    Hoy Nidia FALCON, [place] 12/02/23 1013

## 2023-12-02 NOTE — Progress Notes (Signed)
 Patient's baseline PET resulted and reviewed. Patient was scheduled to come into the office today for hospital follow up and possible treatment discussion. She was brought to the ED via EMS this morning. Dr Timmy notified, and request for consultation shared with Olam Brunner NP.

## 2023-12-02 NOTE — ED Triage Notes (Signed)
 Pt arrives via EMS from home. Pt reports worsening sob and generalized weakness over the past 3 days. Pt AxOx4.

## 2023-12-02 NOTE — ED Notes (Signed)
 CRITICAL VALUE STICKER  CRITICAL VALUE:Calcium 6.2  RECEIVER (on-site recipient of call):I. Logan Bores, RN  DATE & TIME NOTIFIED: 12/02/23 1010  MESSENGER (representative from lab):Lab  MD NOTIFIED: Wallace Cullens  TIME OF NOTIFICATION:1010  RESPONSE:

## 2023-12-02 NOTE — ED Notes (Signed)
 Not able to obtain BNP and CBC.  Tried twice.

## 2023-12-02 NOTE — ED Provider Notes (Signed)
  Physical Exam  BP 121/70   Pulse (!) 103   Temp 98.7 F (37.1 C) (Oral)   Resp (!) 28   Ht 5' 6 (1.676 m)   Wt 70.3 kg   LMP  (LMP Unknown)   SpO2 99%   BMI 25.02 kg/m   Physical Exam  Procedures  Procedures  ED Course / MDM   Clinical Course as of 12/03/23 1042  Thu Dec 02, 2023  1559 Spoke with Dr. Timmy, they will see in consult, patient may require emergent chemotherapy.  He will see in the morning to make final decision [SG]  1725 Assumed care from Dr Elnor. 58 yo F with hx of lung cancer and COPD who presented with hypoxia and has been generally fatigued over several days. Has new Oxygen requirement of 2L. Also tachycardic. Has hypocalcemia and got IV Ca. Awaiting CT PE. Oncology would like for her to be admitted.  [RP]  1846 Dr Tina from oncology consulted.  Discussed the patient's imaging results with him which do show a mass but does not appear to have changed significantly in size.  No pneumonia or PE.  He recommended admission for evaluation by her primary oncologist and for consideration of initiating chemotherapy while in the hospital.  Patient reevaluated and was stable.  No stridor to suggest that the lung mass was compressing her airway.  I do not appreciate any wheezing at this time.  Admitted to hospitalist for further management. [RP]    Clinical Course User Index [RP] Yolande Lamar BROCKS, MD [SG] Elnor Jayson LABOR, DO   Medical Decision Making Amount and/or Complexity of Data Reviewed Labs: ordered. Radiology: ordered.  Risk Prescription drug management. Decision regarding hospitalization.     Yolande Lamar BROCKS, MD 12/03/23 [phone]

## 2023-12-02 NOTE — ED Notes (Signed)
 Patient transported to CT

## 2023-12-02 NOTE — H&P (Signed)
 History and Physical    Tammy Cordova FMW:[id number] DOB: 05/08/1966 DOA: 12/02/2023  PCP: Vernon Velna SAUNDERS, MD  Patient coming from: Home  I have personally briefly reviewed patient's old medical records in [facility]  Chief Complaint: Fatigue, cough  HPI: Tammy Cordova is a 58 y.o. female with medical history significant for COPD, HTN, RA, and recently diagnosed invasive RUL lung mass (pathology consistent with small cell carcinoma) who presented to the ED for evaluation of fatigue and cough.  Patient was recently admitted 11/25/2023-11/27/2023 with a newly found right upper lobe lung tumor with metastasis.  Imaging consistent with invasive lung tumor to the mediastinum with infiltration into the ribs/cervical spine.  MRI brain was without evidence of metastases.  CT scan abdomen pelvis without evidence of malignancy.  She was seen by oncology and radiation oncology while in hospital.  She had plans to start radiation treatment on 1/6.  She underwent biopsy 12/27.  Pathology returned to her earlier today showing metastatic high-grade neuroendocrine tumor, most consistent with small cell carcinoma.  Patient states that since leaving the hospital she has been feeling progressively fatigued, generally weak.  Appetite has been poor and she has not been keeping up with oral intake.  She has had some difficulty swallowing foods and liquids due to feeling an unusual fullness in her throat/neck area.  She reports a new nonproductive cough today.  She was feeling short of breath earlier today but states breathing is improving after receiving a nebulizer treatment.  Patient was seen by oncology and radiation oncology teams.  She says she did go urgently to start her radiation treatment this afternoon.  ED Course  Labs/Imaging on admission: I have personally reviewed following labs and imaging studies.  Initial vitals showed BP 127/75, pulse 118, RR 15, temp 98.0 F, SpO2 95% on room air.   SpO2 dropped to 89% and patient placed on 2 L O2 via [place].  Labs showed WBC 13.5, hemoglobin 15.2, platelets 2 93,000, sodium 133, potassium 3.9, bicarb 16, BUN 22, creatinine 1.12, albumin 3.2, calcium  6.2 (6.8 when corrected for hypoalbuminemia), BNP 50.6, troponin 7 x 2.  SARS-CoV-2, influenza, RSV PCR negative.  CTA chest negative for evidence of PE.  Stable right upper lobe mass with mediastinal invasion and left for deviation of the trachea.  Stable 2.4 cm right supraclavicular node/mass.  Stable pleural nodularity along the lateral margin of the left major fissure, previously hypermetabolic, compatible with malignancy.  Severe emphysema.  Continued prominence of the main pulmonary artery compatible with pulmonary arterial hypertension.  1.5 cm left thyroid  nodule, previously hypermetabolic.  Patient was given 1 L normal saline, IV calcium  gluconate 1 g, DuoNeb treatment.  Case was discussed with oncology who recommended medical admission to initiate chemotherapy.  Radiation/oncology also saw patient and she was taken urgently to start radiation treatment this afternoon.  The hospitalist service was consulted to admit.  Review of Systems: All systems reviewed and are negative except as documented in history of present illness above.   Past Medical History:  Diagnosis Date   COPD (chronic obstructive pulmonary disease) (HCC)    Pneumonia     Past Surgical History:  Procedure Laterality Date   BREAST BIOPSY Left 04/2016   REACTIVE LYMPHOID HYPERPLASIA    BUNIONECTOMY     bilateral    Social History:  reports that she has been smoking cigarettes. She has a 10.8 pack-year smoking history. She has never used smokeless tobacco. She reports current drug use. Drug:  Marijuana. She reports that she does not drink alcohol.  No Known Allergies  Family History  Problem Relation Age of Onset   Breast cancer Paternal Grandmother 16   Diabetes Mother    Hypertension Mother    Diabetes Father     Hypertension Father    Cancer Father      Prior to Admission medications   Medication Sig Start Date End Date Taking? Authorizing Provider  albuterol  (PROAIR  HFA) 108 (90 Base) MCG/ACT inhaler 2 puffs every 4 hours as needed only  if your can't catch your breath 09/26/20   Darlean Ozell NOVAK, MD  alum & mag hydroxide-simeth (MAALOX/MYLANTA) 200-200-20 MG/5ML suspension Take 15 mLs by mouth every 4 (four) hours as needed for indigestion or heartburn. 11/27/23   Raenelle Coria, MD  budesonide -formoterol  (SYMBICORT ) 160-4.5 MCG/ACT inhaler Take 2 puffs first thing in am and then another 2 puffs about 12 hours later. 09/26/20   Darlean Ozell NOVAK, MD  meloxicam (MOBIC) 15 MG tablet Take 15 mg by mouth daily.    [provider]  oxyCODONE  (OXY IR/ROXICODONE ) 5 MG immediate release tablet Take 1 tablet (5 mg total) by mouth every 4 (four) hours as needed for up to 5 days for moderate pain (pain score 4-6). 11/27/23 12/02/23  Raenelle Coria, MD  pantoprazole  (PROTONIX ) 40 MG tablet Take 40 mg by mouth every morning.    [provider]  valACYclovir (VALTREX) 500 MG tablet Take 500 mg by mouth 2 (two) times daily as needed. 07/10/20   [provider]  valsartan (DIOVAN) 320 MG tablet Take 320 mg by mouth daily.    [provider]    Physical Exam: Vitals:   12/02/23 1900 12/02/23 1919 12/02/23 1930 12/02/23 2000  BP: 111/66  121/70 123/71  Pulse: (!) 114  (!) 115 (!) 116  Resp: (!) 22     Temp:  98.1 F (36.7 C)  99 F (37.2 C)  TempSrc:  Oral  Oral  SpO2: 96%  92% 94%  Weight:      Height:       Constitutional: Resting in bed with head elevated, appears fatigued Eyes: EOMI, lids and conjunctivae normal ENMT: Mucous membranes are moist. Posterior pharynx clear of any exudate or lesions.Normal dentition.  Neck:  supple Respiratory: clear to auscultation bilaterally, no wheezing, no crackles. Normal respiratory effort while on 2 L O2 via [place]. No accessory muscle  use.  Cardiovascular: Regular rate and rhythm, no murmurs / rubs / gallops. No extremity edema. 2+ pedal pulses. Abdomen: no tenderness, no masses palpated. Musculoskeletal: no clubbing / cyanosis. No joint deformity upper and lower extremities. Good ROM, no contractures. Normal muscle tone.  Skin: no rashes, lesions, ulcers. No induration Neurologic: Sensation intact. Strength 5/5 in all 4.  Psychiatric: Normal judgment and insight. Alert and oriented x 3. Normal mood.   EKG: Personally reviewed. Sinus tachycardia, rate 119, no acute ischemic changes.  Rate is faster when compared to previous.  Assessment/Plan Principal Problem:   Small cell carcinoma of upper lobe of right lung (HCC) Active Problems:   Acute respiratory failure with hypoxia (HCC)   COPD (chronic obstructive pulmonary disease) (HCC)   Left thyroid  nodule   Hypocalcemia   Failure to thrive in adult   NAVY ROTHSCHILD is a 58 y.o. female with medical history significant for COPD, HTN, RA, and recently diagnosed invasive RUL lung mass (pathology consistent with small cell carcinoma) who is admitted for management of recently diagnosed invasive RUL small cell  carcinoma of the lung.  Assessment and Plan: Small cell lung cancer, invasive RUL lung mass Acute respiratory failure with hypoxia: Recent findings of invasive RUL lung mass infiltrating mediastinum with metastatic adenopathy.  Pathology has now returned consistent with small cell carcinoma.  She has developed hypoxia with new 2 L Supple O2 [place] requirement on admission.  CT chest negative for PE and otherwise shows stable malignant findings from PET/CT. -Oncology, Dr. Timmy, to follow, likely to start inpatient chemotherapy tomorrow -Radiation oncology have started on urgent radiation treatment today 12/02/2023 -Continue supplemental oxygen as needed  Failure to thrive: Patient with progressive generalized weakness, fatigue, lack of appetite.  She reports some recent  dysphagia. -Gentle IV fluid hydration overnight -PT/OT eval -SLP eval  COPD: Short of breath on ED arrival, she reports symptomatic improvement after receiving DuoNeb treatment.  No wheezing appreciated at time of admission.  Prescribed Symbicort  but not taking at home. -Continue maintenance Symbicort , DuoNebs as needed  Hypocalcemia: Corrected calcium  is 6.8.  Patient did receive recent dose of Zometa  4 mg IV on 12/27.  She was given 1 g IV calcium  gluconate while in the ED. repeat labs in AM.  Hypertension: Normotensive on admission, hold valsartan for now.  Left thyroid  nodule: 1.5 cm left thyroid  nodule seen again on CT imaging.  Lesion was hypermetabolic on PET/CT 11/30/2023.  Follow-up nonemergent thyroid  ultrasound recommended.   DVT prophylaxis: enoxaparin  (LOVENOX ) injection 40 mg Start: 12/02/23 2200 Code Status: Full code, confirmed with patient on admission Family Communication: Spouse at bedside Disposition Plan: From home, dispo pending clinical progress Consults called: Oncology, radiation oncology Severity of Illness: The appropriate patient status for this patient is INPATIENT. Inpatient status is judged to be reasonable and necessary in order to provide the required intensity of service to ensure the patient's safety. The patient's presenting symptoms, physical exam findings, and initial radiographic and laboratory data in the context of their chronic comorbidities is felt to place them at high risk for further clinical deterioration. Furthermore, it is not anticipated that the patient will be medically stable for discharge from the hospital within 2 midnights of admission.   * I certify that at the point of admission it is my clinical judgment that the patient will require inpatient hospital care spanning beyond 2 midnights from the point of admission due to high intensity of service, high risk for further deterioration and high frequency of surveillance required.DEWAINE Jorie Blanch MD [facility]  If 7PM-7AM, please contact night-coverage [email or web address]  12/02/2023, 8:32 PM

## 2023-12-03 ENCOUNTER — Other Ambulatory Visit: Payer: Self-pay

## 2023-12-03 ENCOUNTER — Ambulatory Visit
Admit: 2023-12-03 | Discharge: 2023-12-03 | Disposition: A | Discharge status: Home / Self Care | Payer: 59 | Attending: Radiation Oncology | Admitting: Radiation Oncology

## 2023-12-03 ENCOUNTER — Encounter (HOSPITAL_COMMUNITY): Payer: Self-pay | Admitting: Hematology & Oncology

## 2023-12-03 ENCOUNTER — Inpatient Hospital Stay (HOSPITAL_COMMUNITY): Payer: 59

## 2023-12-03 DIAGNOSIS — E041 Nontoxic single thyroid nodule: Secondary | ICD-10-CM

## 2023-12-03 DIAGNOSIS — C3411 Malignant neoplasm of upper lobe, right bronchus or lung: Secondary | ICD-10-CM | POA: Diagnosis not present

## 2023-12-03 DIAGNOSIS — M069 Rheumatoid arthritis, unspecified: Secondary | ICD-10-CM | POA: Diagnosis not present

## 2023-12-03 DIAGNOSIS — F17210 Nicotine dependence, cigarettes, uncomplicated: Secondary | ICD-10-CM

## 2023-12-03 DIAGNOSIS — R627 Adult failure to thrive: Secondary | ICD-10-CM | POA: Diagnosis not present

## 2023-12-03 HISTORY — PX: IR IMAGING GUIDED PORT INSERTION: IMG5740

## 2023-12-03 LAB — CBC
HCT: 39.4 % (ref 36.0–46.0)
Hemoglobin: 12.8 g/dL (ref 12.0–15.0)
MCH: 28.1 pg (ref 26.0–34.0)
MCHC: 32.5 g/dL (ref 30.0–36.0)
MCV: 86.4 fL (ref 80.0–100.0)
Platelets: 240 10*3/uL (ref 150–400)
RBC: 4.56 MIL/uL (ref 3.87–5.11)
RDW: 15.7 % — ABNORMAL HIGH (ref 11.5–15.5)
WBC: 10.4 10*3/uL (ref 4.0–10.5)
nRBC: 0 % (ref 0.0–0.2)

## 2023-12-03 LAB — RAD ONC ARIA SESSION SUMMARY
Course Elapsed Days: 1
Plan Fractions Treated to Date: 2
Plan Prescribed Dose Per Fraction: 2.5 Gy
Plan Total Fractions Prescribed: 15
Plan Total Prescribed Dose: 37.5 Gy
Reference Point Dosage Given to Date: 5 Gy
Reference Point Session Dosage Given: 2.5 Gy
Session Number: 2

## 2023-12-03 LAB — COMPREHENSIVE METABOLIC PANEL
ALT: 25 U/L (ref 0–44)
AST: 28 U/L (ref 15–41)
Albumin: 2.4 g/dL — ABNORMAL LOW (ref 3.5–5.0)
Alkaline Phosphatase: 63 U/L (ref 38–126)
Anion gap: 9 (ref 5–15)
BUN: 40 mg/dL — ABNORMAL HIGH (ref 6–20)
CO2: 18 mmol/L — ABNORMAL LOW (ref 22–32)
Calcium: 5.7 mg/dL — CL (ref 8.9–10.3)
Chloride: 111 mmol/L (ref 98–111)
Creatinine, Ser: 1.24 mg/dL — ABNORMAL HIGH (ref 0.44–1.00)
GFR, Estimated: 51 mL/min — ABNORMAL LOW (ref >60)
Glucose, Bld: 102 mg/dL — ABNORMAL HIGH (ref 70–99)
Potassium: 4.8 mmol/L (ref 3.5–5.1)
Sodium: 138 mmol/L (ref 135–145)
Total Bilirubin: 0.8 mg/dL (ref 0.0–1.2)
Total Protein: 7.6 g/dL (ref 6.5–8.1)

## 2023-12-03 LAB — URIC ACID: Uric Acid, Serum: 6.6 mg/dL (ref 2.5–7.1)

## 2023-12-03 MED ORDER — SODIUM CHLORIDE 0.9 % IV SOLN
100.0000 mg/m2 | Freq: Once | INTRAVENOUS | Status: AC
  Administered 2023-12-04: 200 mg via INTRAVENOUS
  Filled 2023-12-03: qty 10

## 2023-12-03 MED ORDER — SODIUM CHLORIDE 0.9 % IV SOLN
100.0000 mg/m2 | Freq: Once | INTRAVENOUS | Status: AC
  Administered 2023-12-05: 200 mg via INTRAVENOUS
  Filled 2023-12-03: qty 10

## 2023-12-03 MED ORDER — FENTANYL CITRATE (PF) 100 MCG/2ML IJ SOLN
INTRAMUSCULAR | Status: AC | PRN
  Administered 2023-12-03: 50 ug via INTRAVENOUS

## 2023-12-03 MED ORDER — CALCIUM GLUCONATE-NACL 2-0.675 GM/100ML-% IV SOLN
2.0000 g | Freq: Once | INTRAVENOUS | Status: AC
  Administered 2023-12-03: 2000 mg via INTRAVENOUS
  Filled 2023-12-03: qty 100

## 2023-12-03 MED ORDER — LIDOCAINE HCL 1 % IJ SOLN
INTRAMUSCULAR | Status: AC
  Filled 2023-12-03: qty 20

## 2023-12-03 MED ORDER — FENTANYL CITRATE (PF) 100 MCG/2ML IJ SOLN
INTRAMUSCULAR | Status: AC
  Filled 2023-12-03: qty 2

## 2023-12-03 MED ORDER — SODIUM CHLORIDE 0.9 % IV SOLN
10.0000 mg | Freq: Once | INTRAVENOUS | Status: AC
  Administered 2023-12-05: 10 mg via INTRAVENOUS
  Filled 2023-12-03: qty 1

## 2023-12-03 MED ORDER — ONDANSETRON HCL 4 MG/2ML IJ SOLN
INTRAMUSCULAR | Status: AC
  Filled 2023-12-03: qty 2

## 2023-12-03 MED ORDER — ONDANSETRON HCL 4 MG/2ML IJ SOLN
INTRAMUSCULAR | Status: AC | PRN
  Administered 2023-12-03: 4 mg via INTRAVENOUS

## 2023-12-03 MED ORDER — HOT PACK MISC ONCOLOGY: 1.0000 | Freq: Once | Status: DC | PRN

## 2023-12-03 MED ORDER — LIDOCAINE HCL 1 % IJ SOLN
20.0000 mL | Freq: Once | INTRAMUSCULAR | Status: AC
  Administered 2023-12-03: 15 mL via INTRADERMAL
  Filled 2023-12-03: qty 20

## 2023-12-03 MED ORDER — SODIUM CHLORIDE 0.9 % IV SOLN
10.0000 mg | Freq: Once | INTRAVENOUS | Status: AC
  Administered 2023-12-04: 10 mg via INTRAVENOUS
  Filled 2023-12-03: qty 1

## 2023-12-03 MED ORDER — ENSURE ENLIVE PO LIQD
237.0000 mL | Freq: Two times a day (BID) | ORAL | Status: DC
  Administered 2023-12-03 – 2023-12-05 (×3): 237 mL via ORAL

## 2023-12-03 MED ORDER — MIDAZOLAM HCL 2 MG/2ML IJ SOLN
INTRAMUSCULAR | Status: AC
  Filled 2023-12-03: qty 2

## 2023-12-03 MED ORDER — MIDAZOLAM HCL 2 MG/2ML IJ SOLN
INTRAMUSCULAR | Status: AC | PRN
  Administered 2023-12-03: 1 mg via INTRAVENOUS

## 2023-12-03 MED ORDER — SODIUM CHLORIDE 0.9 % IV SOLN
150.0000 mg | Freq: Once | INTRAVENOUS | Status: AC
  Administered 2023-12-04: 150 mg via INTRAVENOUS
  Filled 2023-12-03: qty 5

## 2023-12-03 MED ORDER — SODIUM CHLORIDE 0.9 % IV SOLN
INTRAVENOUS | Status: AC | PRN
  Administered 2023-12-03: 10 mL/h via INTRAVENOUS

## 2023-12-03 MED ORDER — SODIUM CHLORIDE 0.9 % IV SOLN
INTRAVENOUS | Status: DC
Start: 2023-12-04 — End: 2023-12-09

## 2023-12-03 MED ORDER — PALONOSETRON HCL INJECTION 0.25 MG/5ML
0.2500 mg | Freq: Once | INTRAVENOUS | Status: AC
  Administered 2023-12-04: 0.25 mg via INTRAVENOUS
  Filled 2023-12-03: qty 5

## 2023-12-03 MED ORDER — SODIUM CHLORIDE 0.9 % IV SOLN
400.0000 mg | Freq: Once | INTRAVENOUS | Status: AC
  Administered 2023-12-04: 400 mg via INTRAVENOUS
  Filled 2023-12-03: qty 40

## 2023-12-03 NOTE — Progress Notes (Signed)
 PROGRESS NOTE    Tammy Cordova  FMW:[id number] DOB: 1966/07/21 DOA: 12/02/2023 PCP: Vernon Velna SAUNDERS, MD    Brief Narrative:  Tammy Cordova is a 58 y.o. female with medical history significant for COPD, HTN, RA, and recently diagnosed invasive RUL lung mass (pathology consistent with small cell carcinoma) presented to the hospital with fatigue and cough which was progressive in nature with decreased oral intake and dysphagia..  Patient had plans for initiation of radiation on 1 6 and had undergone biopsy 1227.  Pathology was notable for metastatic high-grade neuroendocrine tumor most consistent with small cell carcinoma.  In the ED patient was hypoxic with pulse ox of 89% and was put on 2 L of oxygen.  Labs showed WBC elevated at 13.5 with a low albumin at 3.2.  COVID influenza and RSV was negative.  CTA chest was negative for PE but stable right upper lobe mass with mediastinal invasion and deviation of trachea was noted.  Patient was given IV fluids calcium  gluconate DuoNebs and case was discussed with oncology who recommended medical admission to initiate chemotherapy.  Radiation oncology also saw the patient and was taken urgently to start radiation treatment.   Assessment and Plan:  Small cell lung cancer, invasive RUL lung mass Acute respiratory failure with hypoxia: Recent findings of invasive RUL lung mass infiltrating mediastinum with metastatic adenopathy.  Pathology has now returned consistent with small cell carcinoma.  She has developed hypoxia with new 2 L Supple O2 [place] requirement on admission.  CT chest negative for PE and otherwise shows stable malignant findings from PET/CT.  Oncology on board.  Status post port placement for chemotherapy. Radiation oncology have started on urgent radiation treatment on 12/02/2023 and 12/03/2023 Continue supplemental oxygen as needed.  Uric acid of 6.6.   Failure to thrive: Patient with progressive generalized weakness, fatigue, lack of appetite.   She reports some recent dysphagia.  Continue hydration PT OT evaluation and speech therapy.  COPD: Continue maintenance Symbicort , DuoNebs as needed   Hypocalcemia: Corrected calcium  is 6.8.  Patient did receive recent dose of Zometa  4 mg IV on 12/27.  She was given 2 g IV calcium  gluconate while in the ED. check CMP in AM.   Hypertension: Normotensive on admission, hold valsartan for now.   Left thyroid  nodule: 1.5 cm left thyroid  nodule seen again on CT imaging.  Lesion was hypermetabolic on PET/CT 11/30/2023.  Follow-up nonemergent thyroid  ultrasound recommended.     DVT prophylaxis: enoxaparin  (LOVENOX ) injection 40 mg Start: 12/02/23 2200   Code Status:     Code Status: Full Code  Disposition: Uncertain at this time.  Status is: Inpatient Remains inpatient appropriate because: Small cell cancer for chemotherapy and radiation, pending clinical improvement   Family Communication: Spoke with the patient's husband at bedside  Consultants:  Oncology Interventional radiology Radiation oncology  Procedures:  Left internal jugular PowerPort placement on 12/03/2023 Radiation treatment  Antimicrobials:  None  Anti-infectives (From admission, onward)    None        Subjective: Today, patient was seen and examined at bedside.  Seen after port placement.  Patient denies any increasing dyspnea chest pain fever or chills.  Feels hungry.  Was nauseated but not anymore.  Objective: Vitals:   12/03/23 1435 12/03/23 1440 12/03/23 1445 12/03/23 1450  BP: 121/78 127/78 120/77 (!) 149/84  Pulse: (!) 101 (!) 106 (!) 107 (!) 110  Resp: (!) 21  (!) 25 (!) 27  Temp:  TempSrc:      SpO2: 96% 94% 95% 93%  Weight:      Height:        Intake/Output Summary (Last 24 hours) at 12/03/2023 1613 Last data filed at 12/03/2023 1515 Gross per 24 hour  Intake 1013.33 ml  Output --  Net 1013.33 ml   Filed Weights   12/02/23 0842  Weight: 70.3 kg    Physical Examination: Body  mass index is 25.02 kg/m.  General:  Average built, not in obvious distress, on nasal cannula oxygen, tachypneic HENT:   No scleral pallor or icterus noted. Oral mucosa is moist.  Chest:    Diminished breath sounds bilaterally.  Tachypneic, left chest wall port in place. CVS: S1 &S2 heard. No murmur.  Mildly tachycardic, Abdomen: Soft, nontender, nondistended.  Bowel sounds are heard.   Extremities: No cyanosis, clubbing or edema.  Peripheral pulses are palpable. Psych: Alert, awake and oriented, normal mood CNS:  No cranial nerve deficits.  Power equal in all extremities.   Skin: Warm and dry.  No rashes noted.  Data Reviewed:   CBC: Recent Labs  Lab 12/02/23 0925 12/03/23 0545  WBC 13.5* 10.4  HGB 15.2* 12.8  HCT 46.8* 39.4  MCV 87.0 86.4  PLT 283 240    Basic Metabolic Panel: Recent Labs  Lab 12/02/23 0845 12/03/23 0545  NA 133* 138  K 3.9 4.8  CL 107 111  CO2 16* 18*  GLUCOSE 142* 102*  BUN 22* 40*  CREATININE 1.12* 1.24*  CALCIUM  6.2* 5.7*    Liver Function Tests: Recent Labs  Lab 12/02/23 0845 12/03/23 0545  AST  --  28  ALT  --  25  ALKPHOS  --  63  BILITOT  --  0.8  PROT  --  7.6  ALBUMIN 3.2* 2.4*     Radiology Studies: IR IMAGING GUIDED PORT INSERTION Result Date: 12/03/2023 CLINICAL DATA:  Worsening symptomatic small cell carcinoma of the right lung with need for emergent chemotherapy. EXAM: IMPLANTED PORT A CATH PLACEMENT WITH ULTRASOUND AND FLUOROSCOPIC GUIDANCE ANESTHESIA/SEDATION: Moderate (conscious) sedation was employed during this procedure. A total of Versed  1.0 mg and Fentanyl  100 mcg was administered intravenously. Moderate Sedation Time: 36 minutes. The patient's level of consciousness and vital signs were monitored continuously by radiology nursing throughout the procedure under my direct supervision. FLUOROSCOPY: 30 seconds.  1.0 mGy. PROCEDURE: The procedure, risks, benefits, and alternatives were explained to the patient. Questions  regarding the procedure were encouraged and answered. The patient understands and consents to the procedure. A time-out was performed prior to initiating the procedure. Ultrasound was utilized to confirm patency of the left internal jugular vein. Under ultrasound image was saved and recorded. The left neck and chest were prepped with chlorhexidine  in a sterile fashion, and a sterile drape was applied covering the operative field. Maximum barrier sterile technique with sterile gowns and gloves were used for the procedure. Local anesthesia was provided with 1% lidocaine . After creating a small venotomy incision, a 21 gauge needle was advanced into the left internal jugular vein under direct, real-time ultrasound guidance. Ultrasound image documentation was performed. After securing guidewire access, an 8 Fr dilator was placed. A J-wire was kinked to measure appropriate catheter length. A subcutaneous port pocket was then created along the upper chest wall utilizing sharp and blunt dissection. Portable cautery was utilized. The pocket was irrigated with sterile saline. A single lumen power injectable port was chosen for placement. The 8 Fr catheter was tunneled from  the port pocket site to the venotomy incision. The port was placed in the pocket. External catheter was trimmed to appropriate length based on guidewire measurement. At the venotomy, an 8 Fr peel-away sheath was placed over a guidewire. The catheter was then placed through the sheath and the sheath removed. Final catheter positioning was confirmed and documented with a fluoroscopic spot image. The port was accessed with a needle and aspirated and flushed with heparinized saline. The access needle was left in place. The venotomy and port pocket incisions were closed with subcutaneous 3-0 Monocryl and subcuticular 4-0 Vicryl. Dermabond was applied to both incisions. COMPLICATIONS: COMPLICATIONS None FINDINGS: After catheter placement, the tip lies at the  cavo-atrial junction. The catheter aspirates normally and is ready for immediate use. IMPRESSION: Placement of single lumen port a cath via left internal jugular vein. The catheter tip lies at the cavo-atrial junction. A power injectable port a cath was placed and is ready for immediate use. Electronically Signed   By: Marcey Moan M.D.   On: 12/03/2023 15:23   CT Angio Chest PE W and/or Wo Contrast Result Date: 12/02/2023 CLINICAL DATA:  Non-small cell lung cancer. Worsening shortness of breath and weakness. * Tracking Code: BO * EXAM: CT ANGIOGRAPHY CHEST WITH CONTRAST TECHNIQUE: Multidetector CT imaging of the chest was performed using the standard protocol during bolus administration of intravenous contrast. Multiplanar CT image reconstructions and MIPs were obtained to evaluate the vascular anatomy. RADIATION DOSE REDUCTION: This exam was performed according to the departmental dose-optimization program which includes automated exposure control, adjustment of the mA and/or kV according to patient size and/or use of iterative reconstruction technique. CONTRAST:  75mL OMNIPAQUE  IOHEXOL  350 MG/ML SOLN COMPARISON:  PET-CT 11/30/2023 and CT chest 11/25/2023. FINDINGS: Cardiovascular: No filling defect is identified in the pulmonary arterial tree to suggest pulmonary embolus. Continued prominence of the main pulmonary artery compatible pulmonary arterial hypertension. Mediastinum/Nodes: Stable upper right mediastinal invasion by the right upper lobe mass. Stable 2.4 cm right supraclavicular node/mass, partially extending into level V of the lower neck. Stable bilateral axillary lymph nodes. 1.5 cm left thyroid  nodule, previously hypermetabolic. Recommend thyroid  US  and biopsy (ref: J Am Coll Radiol. 2015 Feb;12(2): 143-50). Lungs/Pleura: Stable right upper lobe mass partially surrounding the right subclavian artery and invading the mediastinum, about 8 cm in long axis on image 33 series 4, and mildly flattening  the right posterior tracheal margin. Leftward deviation of the trachea as before. Severe emphysema. Right suprahilar nodularity tangential to the mass. Stable pleural nodularity along the lateral margin of the left major fissure on image 94 series 12, previously hypermetabolic, compatible with malignancy. Upper Abdomen: Unremarkable Musculoskeletal: Mild thoracic spondylosis. Review of the MIP images confirms the above findings. IMPRESSION: 1. No filling defect is identified in the pulmonary arterial tree to suggest pulmonary embolus. 2. Stable right upper lobe mass with mediastinal invasion and leftward deviation of the trachea. 3. Stable 2.4 cm right supraclavicular node/mass. 4. Stable pleural nodularity along the lateral margin of the left major fissure, previously hypermetabolic, compatible with malignancy. 5. Severe emphysema. 6. Continued prominence of the main pulmonary artery compatible with pulmonary arterial hypertension. 7. 1.5 cm left thyroid  nodule, previously hypermetabolic. Recommend thyroid  US  and biopsy. Emphysema (ICD10-J43.9). Electronically Signed   By: Ryan Salvage M.D.   On: 12/02/2023 18:24   DG Chest 2 View Result Date: 12/02/2023 CLINICAL DATA:  Shortness of breath. EXAM: CHEST - 2 VIEW COMPARISON:  December 20, 2021. FINDINGS: The heart size and mediastinal  contours are within normal limits. Minimal bibasilar subsegmental atelectasis is noted. The visualized skeletal structures are unremarkable. IMPRESSION: Minimal bibasilar subsegmental atelectasis. Electronically Signed   By: Lynwood Landy Raddle M.D.   On: 12/02/2023 10:19      LOS: 1 day    Vernal Alstrom, MD [facility] Available via Epic secure chat 7am-7pm After these hours, please refer to coverage provider listed on [email or web address] 12/03/2023, 4:13 PM

## 2023-12-03 NOTE — Progress Notes (Signed)
 SLP Cancellation Note  Patient Details Name: Tammy Cordova MRN: [id number] DOB: 06/28/1966   Cancelled treatment:       Reason Eval/Treat Not Completed: (P) Other (comment) (pt NPO for port and radiation, will continue efforts)  Tammy POUR, MS [facility] SLP Acute Rehab Services Office [phone]   Tammy Cordova 12/03/2023, 10:34 AM

## 2023-12-03 NOTE — ED Notes (Signed)
 Called IR about procedure time and stated it would be about 1330 that she would potentially go for procedure.

## 2023-12-03 NOTE — Progress Notes (Addendum)
 Referring Physician(s): Ennever,P  Supervising Physician: Luverne Aran  Patient Status:  [facility] ED  Chief Complaint:  Fatigue, cough, lung cancer  Subjective: Patient familiar to IR service from right supraclavicular lymph node biopsy on 11/26/2023. She is a 58 y.o. 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  She underwent radiation therapy yesterday and request now received for Port-A-Cath placement to assist with chemotherapy.    CT angio chest yesterday revealed:   1. No filling defect is identified in the pulmonary arterial tree to suggest pulmonary embolus. 2. Stable right upper lobe mass with mediastinal invasion and leftward deviation of the trachea. 3. Stable 2.4 cm right supraclavicular node/mass. 4. Stable pleural nodularity along the lateral margin of the left major fissure, previously hypermetabolic, compatible with malignancy. 5. Severe emphysema. 6. Continued prominence of the main pulmonary artery compatible with pulmonary arterial hypertension. 7. 1.5 cm left thyroid  nodule, previously hypermetabolic. Recommend thyroid  US  and biopsy.   Emphysema   She currently denies fever, abd /back pain,N/V or bleeding. She does have occ HA, some epigastric /mediastinal fullness, dyspnea with exertion, occ cough. WBC nl, hgb nl, plts nl, PT/INR nl. Creat 1.24. Calcium  5.7.    Past Medical History:  Diagnosis Date   COPD (chronic obstructive pulmonary disease) (HCC)    Pneumonia    Past Surgical History:  Procedure Laterality Date   BREAST BIOPSY Left 04/2016   REACTIVE LYMPHOID HYPERPLASIA    BUNIONECTOMY     bilateral      Allergies: Patient has no known allergies.  Medications: Prior to Admission medications   Medication Sig Start Date  End Date Taking? Authorizing Provider  meloxicam (MOBIC) 15 MG tablet Take 15 mg by mouth daily.   Yes [provider]  budesonide -formoterol  (SYMBICORT ) 160-4.5 MCG/ACT inhaler Take 2 puffs first thing in am and then another 2 puffs about 12 hours later. Patient not taking: Reported on 12/02/2023 09/26/20   Darlean Ozell NOVAK, MD  pantoprazole  (PROTONIX ) 40 MG tablet Take 40 mg by mouth every morning. Patient not taking: Reported on 12/02/2023    [provider]  valsartan (DIOVAN) 320 MG tablet Take 320 mg by mouth daily. Patient not taking: Reported on 12/02/2023    [provider]     Vital Signs: BP 114/77   Pulse (!) 104   Temp 98.4 F (36.9 C) (Oral)   Resp (!) 24   Ht 5' 6 (1.676 m)   Wt 155 lb (70.3 kg)   LMP  (LMP Unknown)   SpO2 93%   BMI 25.02 kg/m   Physical Exam:awake/alert; chest- distant BS bilat; heart- tachy but reg rhythm, abd-soft,+BS,NT; no LE edema; rt Crowley nodal mass  Imaging: CT Angio Chest PE W and/or Wo Contrast Result Date: 12/02/2023 CLINICAL DATA:  Non-small cell lung cancer. Worsening shortness of breath and weakness. * Tracking Code: BO * EXAM: CT ANGIOGRAPHY CHEST WITH CONTRAST TECHNIQUE: Multidetector CT imaging of the chest was performed using the standard protocol during bolus administration of intravenous contrast. Multiplanar CT image reconstructions and MIPs were obtained to evaluate the vascular anatomy. RADIATION DOSE REDUCTION: This exam was performed according to the departmental dose-optimization program which includes automated exposure control, adjustment of the mA and/or kV according to patient size and/or use of iterative reconstruction technique. CONTRAST:  75mL OMNIPAQUE   IOHEXOL  350 MG/ML SOLN COMPARISON:  PET-CT 11/30/2023 and CT chest 11/25/2023. FINDINGS: Cardiovascular: No filling defect is identified in the pulmonary arterial tree to suggest pulmonary embolus. Continued prominence of the main pulmonary artery compatible  pulmonary arterial hypertension. Mediastinum/Nodes: Stable upper right mediastinal invasion by the right upper lobe mass. Stable 2.4 cm right supraclavicular node/mass, partially extending into level V of the lower neck. Stable bilateral axillary lymph nodes. 1.5 cm left thyroid  nodule, previously hypermetabolic. Recommend thyroid  US  and biopsy (ref: J Am Coll Radiol. 2015 Feb;12(2): 143-50). Lungs/Pleura: Stable right upper lobe mass partially surrounding the right subclavian artery and invading the mediastinum, about 8 cm in long axis on image 33 series 4, and mildly flattening the right posterior tracheal margin. Leftward deviation of the trachea as before. Severe emphysema. Right suprahilar nodularity tangential to the mass. Stable pleural nodularity along the lateral margin of the left major fissure on image 94 series 12, previously hypermetabolic, compatible with malignancy. Upper Abdomen: Unremarkable Musculoskeletal: Mild thoracic spondylosis. Review of the MIP images confirms the above findings. IMPRESSION: 1. No filling defect is identified in the pulmonary arterial tree to suggest pulmonary embolus. 2. Stable right upper lobe mass with mediastinal invasion and leftward deviation of the trachea. 3. Stable 2.4 cm right supraclavicular node/mass. 4. Stable pleural nodularity along the lateral margin of the left major fissure, previously hypermetabolic, compatible with malignancy. 5. Severe emphysema. 6. Continued prominence of the main pulmonary artery compatible with pulmonary arterial hypertension. 7. 1.5 cm left thyroid  nodule, previously hypermetabolic. Recommend thyroid  US  and biopsy. Emphysema (ICD10-J43.9). Electronically Signed   By: Ryan Salvage M.D.   On: 12/02/2023 18:24   DG Chest 2 View Result Date: 12/02/2023 CLINICAL DATA:  Shortness of breath. EXAM: CHEST - 2 VIEW COMPARISON:  December 20, 2021. FINDINGS: The heart size and mediastinal contours are within normal limits. Minimal  bibasilar subsegmental atelectasis is noted. The visualized skeletal structures are unremarkable. IMPRESSION: Minimal bibasilar subsegmental atelectasis. Electronically Signed   By: Lynwood Landy Raddle M.D.   On: 12/02/2023 10:19   NM PET Image Initial (PI) Skull Base To Thigh Result Date: 12/01/2023 CLINICAL DATA:  Initial treatment strategy for non-small-cell lung cancer. EXAM: NUCLEAR MEDICINE PET SKULL BASE TO THIGH TECHNIQUE: 7.7 mCi F-18 FDG was injected intravenously. Full-ring PET imaging was performed from the skull base to thigh after the radiotracer. CT data was obtained and used for attenuation correction and anatomic localization. Fasting blood glucose: 117 mg/dl COMPARISON:  Abdomen and pelvis CT 11/27/2023.  Chest CT 11/25/2023 FINDINGS: Mediastinal blood pool activity: SUV max 2.4 Liver activity: SUV max NA NECK: Tiny lymph nodes in the right lower neck show low level uptake, potentially metastatic. Incidental CT findings: None. CHEST: Medial right upper lung mass is markedly hypermetabolic with SUV max = 9.3 with associated hypermetabolic central nodularity in the right suprahilar lung. 2.4 cm short axis right supraclavicular lymphadenopathy is hypermetabolic with SUV max = 6.9. Focal uptake identified along the posterior aspect of the inferior left thyroid  lobe. Noncontrast CT imaging shows a an apparent 15 mm posterior left thyroid  nodule on 47/4. SUV max = 6.7. Hypermetabolic mediastinal and hilar lymph nodes evident. Small lymph nodes in the axillary regions bilaterally are FDG avid with SUV max = 5.6 on the left and 5.1 on the right. 12 mm subpleural lower left lung nodule on 89/4 is hypermetabolic with SUV max = 7.5. Incidental CT findings: Enlargement of the pulmonary outflow tract and main pulmonary arteries suggests pulmonary arterial hypertension. Mild  atherosclerotic calcification is noted in the wall of the thoracic aorta. Centrilobular and paraseptal emphysema evident. ABDOMEN/PELVIS: No  abnormal hypermetabolic activity within the liver, pancreas, adrenal glands, or spleen. No hypermetabolic lymph nodes in the abdomen. Bilateral external iliac and groin lymphadenopathy noted. A 12 mm short axis left external iliac node on 173/4 is hypermetabolic with SUV max = 5.2. 13 mm short axis right external iliac node on 174/4 is hypermetabolic with SUV max = 4.6. Lymph nodes in the groin regions show low level hypermetabolism ( SUV max = 3.0). Incidental CT findings: None. SKELETON: Radiotracer accumulation in the marrow space is diffusely mottled. No definite bony metastatic involvement. 1 particular focus of uptake in the posterior right iliac crest demonstrates SUV max = 4.7 with no underlying CT abnormality (corresponding to image 149/4). Incidental CT findings: None. IMPRESSION: 1. Markedly hypermetabolic medial right upper lung mass with associated hypermetabolic central nodularity in the right suprahilar lung. Imaging features compatible with primary bronchogenic carcinoma. 2. Hypermetabolic right supraclavicular, mediastinal, hilar, and axillary lymphadenopathy consistent with metastatic disease. Hypermetabolic external iliac lymphadenopathy identified bilaterally in the pelvis with mildly hypermetabolic groin lymphadenopathy. Metastatic disease not excluded. 3. 12 mm subpleural lower left lung nodule is hypermetabolic and compatible with metastatic disease or synchronous primary neoplasm. 4. Hypermetabolic 15 mm posterior left thyroid  nodule on noncontrast CT imaging. Recommend thyroid  US  and biopsy (ref: J Am Coll Radiol. 2015 Feb;12(2): 143-50). 5. Enlargement of the pulmonary outflow tract and main pulmonary arteries suggests pulmonary arterial hypertension. 6. Aortic Atherosclerosis (ICD10-I70.0) and Emphysema (ICD10-J43.9). Electronically Signed   By: Camellia Candle M.D.   On: 12/01/2023 09:44    Labs:  CBC: Recent Labs    11/25/23 1319 11/26/23 0526 12/02/23 0925 12/03/23 0545  WBC   --  3.8* 13.5* 10.4  HGB 14.3 12.3 15.2* 12.8  HCT 42.0 39.3 46.8* 39.4  PLT  --  303 283 240    COAGS: Recent Labs    11/26/23 1112  INR 1.0    BMP: Recent Labs    11/25/23 1319 11/26/23 0526 12/02/23 0845 12/03/23 0545  NA 139 133* 133* 138  K 3.9 3.8 3.9 4.8  CL 103 105 107 111  CO2  --  22 16* 18*  GLUCOSE 114* 84 142* 102*  BUN 27* 19 22* 40*  CALCIUM   --  8.0* 6.2* 5.7*  CREATININE 1.60* 0.69 1.12* 1.24*  GFRNONAA  --  >60 58* 51*    LIVER FUNCTION TESTS: Recent Labs    12/02/23 0845 12/03/23 0545  BILITOT  --  0.8  AST  --  28  ALT  --  25  ALKPHOS  --  63  PROT  --  7.6  ALBUMIN 3.2* 2.4*    Assessment and Plan: 58 y.o. 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     CT angio cheat yesterday revealed:   1. No filling defect is identified in the pulmonary arterial tree to suggest pulmonary embolus. 2. Stable right upper lobe mass with mediastinal invasion and leftward deviation of the trachea. 3. Stable 2.4 cm right supraclavicular node/mass. 4. Stable pleural nodularity along the lateral margin of the left major fissure, previously hypermetabolic, compatible with malignancy. 5. Severe emphysema. 6. Continued prominence of the main pulmonary artery compatible with pulmonary arterial hypertension. 7. 1.5 cm left  thyroid  nodule, previously hypermetabolic. Recommend thyroid  US  and biopsy.   Emphysema   She underwent radiation therapy yesterday and request now received for Port-A-Cath placement to assist with chemotherapy tomorrow.Risks and benefits of image guided port-a-catheter placement was discussed with the patient/spouse  including, but not limited to bleeding, infection, pneumothorax, or fibrin sheath development and need for  additional procedures.  All of the patient's questions were answered, patient is agreeable to proceed. Consent signed and in chart. Procedure tent planned for later today.   Pt afebrile; WBC nl, hgb nl, plts nl, PT/INR nl. Creat 1.24. Calcium  5.7.   Electronically Signed: D. Franky Rakers, PA-C 12/03/2023, 9:23 AM   I spent a total of 25 minutes at the the patient's bedside AND on the patient's hospital floor or unit, greater than 50% of which was counseling/coordinating care for port a cath placement    Patient ID: Ronal GORMAN Herring, female   DOB: Jan 02, 1966, 58 y.o.   MRN: [id number]

## 2023-12-03 NOTE — Plan of Care (Signed)
  Problem: Education: Goal: Knowledge of General Education information will improve Description: Including pain rating scale, medication(s)/side effects and non-pharmacologic comfort measures Outcome: Progressing   Problem: Nutrition: Goal: Adequate nutrition will be maintained Outcome: Progressing   Problem: Coping: Goal: Level of anxiety will decrease Outcome: Progressing   Problem: Elimination: Goal: Will not experience complications related to bowel motility Outcome: Progressing   Problem: Pain Management: Goal: General experience of comfort will improve Outcome: Progressing   Problem: Safety: Goal: Ability to remain free from injury will improve Outcome: Progressing

## 2023-12-03 NOTE — Procedures (Signed)
 Interventional Radiology Procedure Note  Procedure: Single Lumen Power Port Placement    Access:  Left IJ vein.  Findings: Catheter tip positioned at SVC/RA junction. Port is ready for immediate use.   Complications: None  EBL: < 10 mL  Recommendations:  - Ok to shower in 24 hours - Do not submerge for 7 days - Routine line care   Hartleigh Edmonston T. Fredia Sorrow, M.D Pager:  [phone]

## 2023-12-03 NOTE — Progress Notes (Signed)
   12/03/23 1445  SLP Visit Information  SLP Received On 12/03/23 (2nd attempt to see pt for swallow eval, RN reports she is in IR receiving her Port, will continue efforts)  Reason Eval/Treat Not Completed Patient at procedure or test/unavailable  Oral Assessment (Complete on admission/transfer/every shift)  Level of Consciousness Responds to Voice   Tammy POUR, MS [facility] SLP Acute Rehab Services Office [phone]

## 2023-12-03 NOTE — ED Notes (Signed)
 Pt to cancer center for radiation treatment. Will be back after treatment, waiting for her bed to be cleaned.

## 2023-12-03 NOTE — Progress Notes (Signed)
 START ON PATHWAY REGIMEN - Small Cell Lung     Cycles 1 through 4, every 21 days:     Atezolizumab       Carboplatin       Etoposide     Cycles 5 and beyond, every 21 days:     Atezolizumab    **Always confirm dose/schedule in your pharmacy ordering system**  Patient Characteristics: Newly Diagnosed, Preoperative or Nonsurgical Candidate (Clinical Staging), First Line, Extensive Stage Therapeutic Status: Newly Diagnosed, Preoperative or Nonsurgical Candidate (Clinical Staging) AJCC T Category: cTX AJCC N Category: cNX AJCC M Category: cM1 AJCC 9 Stage Grouping: IVB Check here if patient was staged using an edition other than AJCC Staging 9th Edition: false Stage Classification: Extensive Intent of Therapy: Non-Curative / Palliative Intent, Discussed with Patient

## 2023-12-03 NOTE — Progress Notes (Signed)
 PT Cancellation Note  Patient Details Name: Tammy Cordova MRN: [id number] DOB: [birth year]   Cancelled Treatment:     Pt was in ED and then to procedure in radiation for port placement. Has just gotten to unit recently and would prefer not to be assessed today. Educated with moving some in bed or sitting EOB with nursing late today to move a little. PT will check on patient tomorrow for evaluation.    MILLICENT SALES 12/03/2023, 4:57 PM Sales, PT, MPT Acute Rehabilitation Services Office: [phone] If a weekend: secure chat groups: [facility] PT, [facility] OT, [facility] SLP 12/03/2023

## 2023-12-04 DIAGNOSIS — R627 Adult failure to thrive: Secondary | ICD-10-CM | POA: Diagnosis not present

## 2023-12-04 DIAGNOSIS — M069 Rheumatoid arthritis, unspecified: Secondary | ICD-10-CM | POA: Diagnosis not present

## 2023-12-04 DIAGNOSIS — C3411 Malignant neoplasm of upper lobe, right bronchus or lung: Secondary | ICD-10-CM | POA: Diagnosis not present

## 2023-12-04 DIAGNOSIS — E041 Nontoxic single thyroid nodule: Secondary | ICD-10-CM | POA: Diagnosis not present

## 2023-12-04 LAB — COMPREHENSIVE METABOLIC PANEL
ALT: 18 U/L (ref 0–44)
AST: 19 U/L (ref 15–41)
Albumin: 2.2 g/dL — ABNORMAL LOW (ref 3.5–5.0)
Alkaline Phosphatase: 62 U/L (ref 38–126)
Anion gap: 7 (ref 5–15)
BUN: 35 mg/dL — ABNORMAL HIGH (ref 6–20)
CO2: 20 mmol/L — ABNORMAL LOW (ref 22–32)
Calcium: 5.8 mg/dL — CL (ref 8.9–10.3)
Chloride: 112 mmol/L — ABNORMAL HIGH (ref 98–111)
Creatinine, Ser: 1.02 mg/dL — ABNORMAL HIGH (ref 0.44–1.00)
GFR, Estimated: >60 mL/min (ref >60)
Glucose, Bld: 111 mg/dL — ABNORMAL HIGH (ref 70–99)
Potassium: 4 mmol/L (ref 3.5–5.1)
Sodium: 139 mmol/L (ref 135–145)
Total Bilirubin: 0.6 mg/dL (ref 0.0–1.2)
Total Protein: 7.1 g/dL (ref 6.5–8.1)

## 2023-12-04 LAB — CBC WITH DIFFERENTIAL/PLATELET
Abs Immature Granulocytes: 0.06 10*3/uL (ref 0.00–0.07)
Basophils Absolute: 0 10*3/uL (ref 0.0–0.1)
Basophils Relative: 0 %
Eosinophils Absolute: 0.3 10*3/uL (ref 0.0–0.5)
Eosinophils Relative: 4 %
HCT: 37.4 % (ref 36.0–46.0)
Hemoglobin: 11.6 g/dL — ABNORMAL LOW (ref 12.0–15.0)
Immature Granulocytes: 1 %
Lymphocytes Relative: 8 %
Lymphs Abs: 0.5 10*3/uL — ABNORMAL LOW (ref 0.7–4.0)
MCH: 26.9 pg (ref 26.0–34.0)
MCHC: 31 g/dL (ref 30.0–36.0)
MCV: 86.6 fL (ref 80.0–100.0)
Monocytes Absolute: 0.3 10*3/uL (ref 0.1–1.0)
Monocytes Relative: 4 %
Neutro Abs: 5.9 10*3/uL (ref 1.7–7.7)
Neutrophils Relative %: 83 %
Platelets: 259 10*3/uL (ref 150–400)
RBC: 4.32 MIL/uL (ref 3.87–5.11)
RDW: 15.7 % — ABNORMAL HIGH (ref 11.5–15.5)
WBC: 7.1 10*3/uL (ref 4.0–10.5)
nRBC: 0 % (ref 0.0–0.2)

## 2023-12-04 LAB — PHOSPHORUS: Phosphorus: 2.3 mg/dL — ABNORMAL LOW (ref 2.5–4.6)

## 2023-12-04 LAB — LACTATE DEHYDROGENASE: LDH: 211 U/L — ABNORMAL HIGH (ref 98–192)

## 2023-12-04 LAB — MAGNESIUM: Magnesium: 2.6 mg/dL — ABNORMAL HIGH (ref 1.7–2.4)

## 2023-12-04 MED ORDER — TRAZODONE HCL 50 MG PO TABS
25.0000 mg | ORAL_TABLET | Freq: Once | ORAL | Status: AC
  Administered 2023-12-04: 25 mg via ORAL
  Filled 2023-12-04: qty 1

## 2023-12-04 MED ORDER — CHLORHEXIDINE GLUCONATE CLOTH 2 % EX PADS
6.0000 | MEDICATED_PAD | Freq: Every day | CUTANEOUS | Status: DC
  Administered 2023-12-04 – 2023-12-08 (×5): 6 via TOPICAL

## 2023-12-04 MED ORDER — CALCIUM GLUCONATE-NACL 2-0.675 GM/100ML-% IV SOLN
2.0000 g | Freq: Once | INTRAVENOUS | Status: AC
  Administered 2023-12-04: 2000 mg via INTRAVENOUS
  Filled 2023-12-04: qty 100

## 2023-12-04 NOTE — Progress Notes (Signed)
 Date and time results received: 12/04/23 0625 (use smartphrase ".now" to insert current time)  Test: Ca Critical Value: 5.8  Name of Provider Notified: Anthoney Harada NP  Orders Received? Or Actions Taken?:

## 2023-12-04 NOTE — Evaluation (Signed)
 Physical Therapy Evaluation Patient Details Name: [name] MRN: [id number] DOB: 13-Mar-1966 Today's Date: 12/04/2023  History of Present Illness  Tammy Cordova is a 58 y.o. female with medical history significant for COPD, HTN, RA, and recently diagnosed invasive RUL lung mass (pathology consistent with small cell carcinoma) presented to the hospital with fatigue and cough which was progressive in nature with decreased oral intake and dysphagia..  Patient had plans for initiation of radiation on 1 6 and had undergone biopsy 1227.  Pathology was notable for metastatic high-grade neuroendocrine tumor most consistent with small cell carcinoma.  Clinical Impression  Pt admitted with above diagnosis.  Pt currently with functional limitations due to the deficits listed below (see PT Problem List). Pt will benefit from acute skilled PT to increase their independence and safety with mobility to allow discharge.  Pt did well, but was initially nervous about getting up. She performed a step pivot to the chair, but feel she could have ambulated short household distance with the RW.  Her goal is to walk in and out of her radiation appointments.  Will follow acutely and recommend HHPT.         If plan is discharge home, recommend the following: A little help with walking and/or transfers;A little help with bathing/dressing/bathroom   Can travel by private vehicle        Equipment Recommendations Rolling walker (2 wheels);BSC/3in1  Recommendations for Other Services       Functional Status Assessment Patient has had a recent decline in their functional status and demonstrates the ability to make significant improvements in function in a reasonable and predictable amount of time.     Precautions / Restrictions Precautions Precautions: Fall Precaution Comments: o2 at time of eval Restrictions Weight Bearing Restrictions Per Provider Order: No      Mobility  Bed Mobility Overal bed  mobility: Needs Assistance Bed Mobility: Supine to Sit     Supine to sit: HOB elevated, Supervision          Transfers Overall transfer level: Needs assistance Equipment used: Rolling walker (2 wheels) Transfers: Sit to/from Stand, Bed to chair/wheelchair/BSC Sit to Stand: Min assist   Step pivot transfers: Contact guard assist       General transfer comment: MIN A to power up and cues for hand placement. She was very nervous about getting up on her legs and weakness.    Ambulation/Gait               General Gait Details: Pt declined gait, though feel she would have been able to ambulate short household distance  Stairs            Wheelchair Mobility     Tilt Bed    Modified Rankin (Stroke Patients Only)       Balance Overall balance assessment: Mild deficits observed, not formally tested                                           Pertinent Vitals/Pain Pain Assessment Pain Assessment: 0-10 Pain Score: 0-No pain    Home Living Family/patient expects to be discharged to:: Private residence Living Arrangements: Spouse/significant other Available Help at Discharge: Family;Available PRN/intermittently Type of Home: House Home Access: Level entry       Home Layout: One level Home Equipment: None      Prior Function Prior Level of Function :  Independent/Modified Independent                     Extremity/Trunk Assessment   Upper Extremity Assessment Upper Extremity Assessment: Defer to OT evaluation    Lower Extremity Assessment Lower Extremity Assessment: Overall WFL for tasks assessed;Generalized weakness       Communication   Communication Communication: No apparent difficulties  Cognition Arousal: Alert Behavior During Therapy: WFL for tasks assessed/performed Overall Cognitive Status: Within Functional Limits for tasks assessed                                          General  Comments General comments (skin integrity, edema, etc.): Port L chest wall    Exercises     Assessment/Plan    PT Assessment Patient needs continued PT services  PT Problem List Decreased strength;Decreased activity tolerance;Decreased balance;Decreased mobility       PT Treatment Interventions DME instruction;Gait training;Functional mobility training;Therapeutic activities;Therapeutic exercise;Balance training;Neuromuscular re-education    PT Goals (Current goals can be found in the Care Plan section)  Acute Rehab PT Goals Patient Stated Goal: To walk in and out of radiation. I don't want to have to use a wheelchair. Time For Goal Achievement: 12/18/23 Potential to Achieve Goals: Good    Frequency Min 1X/week     Co-evaluation               AM-PAC PT 6 Clicks Mobility  Outcome Measure Help needed turning from your back to your side while in a flat bed without using bedrails?: None Help needed moving from lying on your back to sitting on the side of a flat bed without using bedrails?: None Help needed moving to and from a bed to a chair (including a wheelchair)?: A Little Help needed standing up from a chair using your arms (e.g., wheelchair or bedside chair)?: A Little Help needed to walk in hospital room?: A Little Help needed climbing 3-5 steps with a railing? : A Little 6 Click Score: 20    End of Session Equipment Utilized During Treatment: Oxygen Activity Tolerance: Patient limited by fatigue Patient left: in chair;with call bell/phone within reach Nurse Communication: Mobility status PT Visit Diagnosis: Muscle weakness (generalized) (M62.81);Difficulty in walking, not elsewhere classified (R26.2)    Time: 8477-8452 PT Time Calculation (min) (ACUTE ONLY): 25 min   Charges:   PT Evaluation $PT Eval Moderate Complexity: 1 Mod PT Treatments $Therapeutic Activity: 8-22 mins PT General Charges $$ ACUTE PT VISIT: 1 Visit         Tammy RAMAN.,  PT Office [phone] Acute Rehab 12/04/2023   Tammy Cordova 12/04/2023, 4:05 PM

## 2023-12-04 NOTE — Progress Notes (Signed)
 OT Cancellation Note  Patient Details Name: Tammy Cordova MRN: [id number] DOB: 03/15/1966   Cancelled Treatment:    Reason Eval/Treat Not Completed: Medical issues which prohibited therapy Per nurse, patient in process of starting first chemo session today. OT to continue to follow and check back as schedule will allow.  Geofm LEYLAND, MS Acute Rehabilitation Department Office# [phone]  12/04/2023, 8:14 AM

## 2023-12-04 NOTE — Progress Notes (Signed)
 Chemotherapy dosages and calculations verified with Hilton Cork RN.

## 2023-12-04 NOTE — Progress Notes (Signed)
 As always, everybody's help has been incredibly appreciated.  She has a Port-A-Cath in already.  She has had radiation therapy.  We will start chemotherapy today.  All the orders are written.  She sounds much better today.  Her calcium  is still on the low side.  She needs to have some IV calcium .  She is not complain of any pain right now.  There is no increased cough or shortness of breath.  She has had no diarrhea.  There is no bowel or bladder incontinence.  Her uric acid is only 6.6.  LDH is only 211.  Her BUN is 35 creatinine 1.02.  Calcium  is still quite low at 5.8.  Her white cell count is 7.1.  Hemoglobin 11.6.  Platelet count 259,000.  There is no obvious bleeding.  Her vital signs show temperature of 98.1.  Pulse 102.  Blood pressure 112/64.  Her head and neck exam shows no ocular or oral lesions.  She does have the right supraclavicular adenopathy.  This is firm and nontender.  Lungs sound clear bilaterally.  She has good air movement bilaterally.  Cardiac exam regular rate and rhythm.  She has no murmurs.  Abdomen is soft.  Bowel sounds are present.  She has no fluid wave.  Extremities shows no clubbing, cyanosis or edema.  Neurological exam is nonfocal.  For right now, we will start her first cycle of chemotherapy.  She will get carboplatinum/VP-16.  As an outpatient, we can certainly add immunotherapy.  The bulk of her disease is in the chest.  This is where she is going to have problems.  This is where we need to get shrinkage fairly quickly.  Again, I am so grateful for everybody's help to get treatment started today.  This will definitely help her quality of life.  This will also improve her outcome.  We will have to continue to work on her calcium .  I know that she will get incredible care from everybody up on 6 E.  Jeralyn Crease, MD  Romans 8:28

## 2023-12-04 NOTE — Plan of Care (Signed)
  Problem: Education: Goal: Knowledge of General Education information will improve Description: Including pain rating scale, medication(s)/side effects and non-pharmacologic comfort measures Outcome: Progressing   Problem: Health Behavior/Discharge Planning: Goal: Ability to manage health-related needs will improve Outcome: Progressing   Problem: Clinical Measurements: Goal: Ability to maintain clinical measurements within normal limits will improve Outcome: Progressing Goal: Will remain free from infection Outcome: Progressing Goal: Diagnostic test results will improve Outcome: Progressing Goal: Respiratory complications will improve Outcome: Progressing Goal: Cardiovascular complication will be avoided Outcome: Progressing   Problem: Activity: Goal: Risk for activity intolerance will decrease Outcome: Progressing   Problem: Nutrition: Goal: Adequate nutrition will be maintained Outcome: Progressing   Problem: Coping: Goal: Level of anxiety will decrease Outcome: Progressing   Problem: Elimination: Goal: Will not experience complications related to bowel motility Outcome: Progressing Goal: Will not experience complications related to urinary retention Outcome: Progressing   Problem: Pain Management: Goal: General experience of comfort will improve Outcome: Progressing   Problem: Safety: Goal: Ability to remain free from injury will improve Outcome: Progressing   Problem: Skin Integrity: Goal: Risk for impaired skin integrity will decrease Outcome: Progressing   Problem: Education: Goal: Knowledge of the prescribed therapy will improve Outcome: Progressing   Problem: Bowel/Gastric: Goal: Occurrences of nausea, vomiting, and/or diarrhea will decrease Outcome: Progressing   Problem: Coping: Goal: Level of anxiety will decrease Outcome: Progressing Goal: Ability to identify and develop effective coping behavior will improve Outcome: Progressing    Problem: Nutritional: Goal: Will achieve and/or maintain adequate nutritional intake Outcome: Progressing   Problem: Skin Integrity: Goal: Risk for impaired skin integrity will decrease Outcome: Progressing   Problem: Urinary Elimination: Goal: Complications of radiation-induced cystitis will be minimized/avoided Outcome: Progressing   Problem: Fatigue: Goal: Expression of feelings of increased energy will improve Outcome: Progressing

## 2023-12-04 NOTE — Progress Notes (Signed)
 PROGRESS NOTE    [name]  FMW:[id number] DOB: 11-15-1966 DOA: 12/02/2023 PCP: Vernon Velna SAUNDERS, MD    Brief Narrative:   Tammy Cordova is a 58 y.o. female with medical history significant for COPD, HTN, RA, and recently diagnosed invasive RUL lung mass (pathology consistent with small cell carcinoma) presented to the hospital with fatigue and cough which was progressive in nature with decreased oral intake and dysphagia..  Patient had plans for initiation of radiation on 1 6 and had undergone biopsy 1227.  Pathology was notable for metastatic high-grade neuroendocrine tumor most consistent with small cell carcinoma.  In the ED patient was hypoxic with pulse ox of 89% and was put on 2 L of oxygen.  Labs showed WBC elevated at 13.5 with a low albumin at 3.2.  COVID influenza and RSV was negative.  CTA chest was negative for PE but stable right upper lobe mass with mediastinal invasion and deviation of trachea was noted.  Patient was given IV fluids calcium  gluconate DuoNebs and case was discussed with oncology who recommended medical admission to initiate chemotherapy.    Assessment and Plan:  Small cell lung cancer, invasive RUL lung mass Acute respiratory failure with hypoxia: Recent findings of invasive RUL lung mass infiltrating mediastinum with metastatic adenopathy.  Pathology consistent with small cell carcinoma.  She has developed hypoxia with new 2 L Supple O2 [place] requirement on admission.  CT chest negative for PE and otherwise shows stable malignant findings from PET/CT.  Oncology on board.  Status post port placement for chemotherapy. Radiation oncology have started on urgent radiation treatment on 12/02/2023.  Uric acid of 6.6.   Failure to thrive: Patient with progressive generalized weakness, fatigue, lack of appetite.  She reports some recent dysphagia.  Continue hydration PT OT evaluation and speech therapy.  COPD: Continue maintenance Symbicort , DuoNebs as needed.  Patient  states that this she feels okay.   Hypocalcemia: Corrected calcium  is 6.8.  Patient did receive recent dose of Zometa  4 mg IV on 12/27.  She was given 2 g IV calcium  gluconate while in the ED. still hypocalcemia today.  Will give 2 g of IV calcium  gluconate again today.   Hypertension: Normotensive on admission, hold valsartan for now.  Latest blood pressure of 112/64   Left thyroid  nodule: 1.5 cm left thyroid  nodule seen again on CT imaging.  Lesion was hypermetabolic on PET/CT 11/30/2023.  Follow-up nonemergent thyroid  ultrasound recommended.     DVT prophylaxis: enoxaparin  (LOVENOX ) injection 40 mg Start: 12/02/23 2200   Code Status:     Code Status: Full Code  Disposition: Likely home when okay with oncology.  Status is: Inpatient Remains inpatient appropriate because: Small cell cancer for chemotherapy and radiation, pending clinical improvement   Family Communication: Spoke with the patient's husband at bedside on 12/03/2023  Consultants:  Oncology Interventional radiology Radiation oncology  Procedures:  Left internal jugular PowerPort placement on 12/03/2023 Radiation treatment  Antimicrobials:  None  Anti-infectives (From admission, onward)    None        Subjective: Today, patient was seen and examined at bedside.  Patient denies any tingling numbness or cramps.  States that her breathing is at baseline.  Was able to eat some.  Denies any pain, vomiting but has mild nausea. Objective: Vitals:   12/03/23 1736 12/03/23 2012 12/03/23 2116 12/04/23 0111  BP: 111/82  126/73 112/64  Pulse:   (!) 101 (!) 102  Resp:   18 18  Temp: 98.3  F (36.8 C)  98.9 F (37.2 C) 98.1 F (36.7 C)  TempSrc: Oral  Oral Oral  SpO2: 95% 96% 96% 97%  Weight:      Height:        Intake/Output Summary (Last 24 hours) at 12/04/2023 0859 Last data filed at 12/04/2023 0453 Gross per 24 hour  Intake 16.33 ml  Output 1 ml  Net 15.33 ml   Filed Weights   12/02/23 0842  Weight:  70.3 kg    Physical Examination: Body mass index is 25.02 kg/m.   General:  Average built, not in obvious distress, on nasal cannula oxygen,  HENT:   No scleral pallor or icterus noted. Oral mucosa is moist.  Chest:    Diminished breath sounds bilaterally.  , left chest wall port in place. CVS: S1 &S2 heard. No murmur.  Mildly tachycardic, Abdomen: Soft, nontender, nondistended.  Bowel sounds are heard.   Extremities: No cyanosis, clubbing or edema.  Peripheral pulses are palpable. Psych: Alert, awake and oriented, normal mood CNS:  No cranial nerve deficits.  Power equal in all extremities.   Skin: Warm and dry.  No rashes noted.  Data Reviewed:   CBC: Recent Labs  Lab 12/02/23 0925 12/03/23 0545 12/04/23 0500  WBC 13.5* 10.4 7.1  NEUTROABS  --   --  5.9  HGB 15.2* 12.8 11.6*  HCT 46.8* 39.4 37.4  MCV 87.0 86.4 86.6  PLT 283 240 259    Basic Metabolic Panel: Recent Labs  Lab 12/02/23 0845 12/03/23 0545 12/04/23 0500  NA 133* 138 139  K 3.9 4.8 4.0  CL 107 111 112*  CO2 16* 18* 20*  GLUCOSE 142* 102* 111*  BUN 22* 40* 35*  CREATININE 1.12* 1.24* 1.02*  CALCIUM  6.2* 5.7* 5.8*  MG  --   --  2.6*  PHOS  --   --  2.3*    Liver Function Tests: Recent Labs  Lab 12/02/23 0845 12/03/23 0545 12/04/23 0500  AST  --  28 19  ALT  --  25 18  ALKPHOS  --  63 62  BILITOT  --  0.8 0.6  PROT  --  7.6 7.1  ALBUMIN 3.2* 2.4* 2.2*     Radiology Studies: IR IMAGING GUIDED PORT INSERTION Result Date: 12/03/2023 CLINICAL DATA:  Worsening symptomatic small cell carcinoma of the right lung with need for emergent chemotherapy. EXAM: IMPLANTED PORT A CATH PLACEMENT WITH ULTRASOUND AND FLUOROSCOPIC GUIDANCE ANESTHESIA/SEDATION: Moderate (conscious) sedation was employed during this procedure. A total of Versed  1.0 mg and Fentanyl  100 mcg was administered intravenously. Moderate Sedation Time: 36 minutes. The patient's level of consciousness and vital signs were monitored  continuously by radiology nursing throughout the procedure under my direct supervision. FLUOROSCOPY: 30 seconds.  1.0 mGy. PROCEDURE: The procedure, risks, benefits, and alternatives were explained to the patient. Questions regarding the procedure were encouraged and answered. The patient understands and consents to the procedure. A time-out was performed prior to initiating the procedure. Ultrasound was utilized to confirm patency of the left internal jugular vein. Under ultrasound image was saved and recorded. The left neck and chest were prepped with chlorhexidine  in a sterile fashion, and a sterile drape was applied covering the operative field. Maximum barrier sterile technique with sterile gowns and gloves were used for the procedure. Local anesthesia was provided with 1% lidocaine . After creating a small venotomy incision, a 21 gauge needle was advanced into the left internal jugular vein under direct, real-time ultrasound guidance. Ultrasound image  documentation was performed. After securing guidewire access, an 8 Fr dilator was placed. A J-wire was kinked to measure appropriate catheter length. A subcutaneous port pocket was then created along the upper chest wall utilizing sharp and blunt dissection. Portable cautery was utilized. The pocket was irrigated with sterile saline. A single lumen power injectable port was chosen for placement. The 8 Fr catheter was tunneled from the port pocket site to the venotomy incision. The port was placed in the pocket. External catheter was trimmed to appropriate length based on guidewire measurement. At the venotomy, an 8 Fr peel-away sheath was placed over a guidewire. The catheter was then placed through the sheath and the sheath removed. Final catheter positioning was confirmed and documented with a fluoroscopic spot image. The port was accessed with a needle and aspirated and flushed with heparinized saline. The access needle was left in place. The venotomy and port  pocket incisions were closed with subcutaneous 3-0 Monocryl and subcuticular 4-0 Vicryl. Dermabond was applied to both incisions. COMPLICATIONS: COMPLICATIONS None FINDINGS: After catheter placement, the tip lies at the cavo-atrial junction. The catheter aspirates normally and is ready for immediate use. IMPRESSION: Placement of single lumen port a cath via left internal jugular vein. The catheter tip lies at the cavo-atrial junction. A power injectable port a cath was placed and is ready for immediate use. Electronically Signed   By: Marcey Moan M.D.   On: 12/03/2023 15:23   CT Angio Chest PE W and/or Wo Contrast Result Date: 12/02/2023 CLINICAL DATA:  Non-small cell lung cancer. Worsening shortness of breath and weakness. * Tracking Code: BO * EXAM: CT ANGIOGRAPHY CHEST WITH CONTRAST TECHNIQUE: Multidetector CT imaging of the chest was performed using the standard protocol during bolus administration of intravenous contrast. Multiplanar CT image reconstructions and MIPs were obtained to evaluate the vascular anatomy. RADIATION DOSE REDUCTION: This exam was performed according to the departmental dose-optimization program which includes automated exposure control, adjustment of the mA and/or kV according to patient size and/or use of iterative reconstruction technique. CONTRAST:  75mL OMNIPAQUE  IOHEXOL  350 MG/ML SOLN COMPARISON:  PET-CT 11/30/2023 and CT chest 11/25/2023. FINDINGS: Cardiovascular: No filling defect is identified in the pulmonary arterial tree to suggest pulmonary embolus. Continued prominence of the main pulmonary artery compatible pulmonary arterial hypertension. Mediastinum/Nodes: Stable upper right mediastinal invasion by the right upper lobe mass. Stable 2.4 cm right supraclavicular node/mass, partially extending into level V of the lower neck. Stable bilateral axillary lymph nodes. 1.5 cm left thyroid  nodule, previously hypermetabolic. Recommend thyroid  US  and biopsy (ref: J Am Coll  Radiol. 2015 Feb;12(2): 143-50). Lungs/Pleura: Stable right upper lobe mass partially surrounding the right subclavian artery and invading the mediastinum, about 8 cm in long axis on image 33 series 4, and mildly flattening the right posterior tracheal margin. Leftward deviation of the trachea as before. Severe emphysema. Right suprahilar nodularity tangential to the mass. Stable pleural nodularity along the lateral margin of the left major fissure on image 94 series 12, previously hypermetabolic, compatible with malignancy. Upper Abdomen: Unremarkable Musculoskeletal: Mild thoracic spondylosis. Review of the MIP images confirms the above findings. IMPRESSION: 1. No filling defect is identified in the pulmonary arterial tree to suggest pulmonary embolus. 2. Stable right upper lobe mass with mediastinal invasion and leftward deviation of the trachea. 3. Stable 2.4 cm right supraclavicular node/mass. 4. Stable pleural nodularity along the lateral margin of the left major fissure, previously hypermetabolic, compatible with malignancy. 5. Severe emphysema. 6. Continued prominence of the main  pulmonary artery compatible with pulmonary arterial hypertension. 7. 1.5 cm left thyroid  nodule, previously hypermetabolic. Recommend thyroid  US  and biopsy. Emphysema (ICD10-J43.9). Electronically Signed   By: Ryan Salvage M.D.   On: 12/02/2023 18:24   DG Chest 2 View Result Date: 12/02/2023 CLINICAL DATA:  Shortness of breath. EXAM: CHEST - 2 VIEW COMPARISON:  December 20, 2021. FINDINGS: The heart size and mediastinal contours are within normal limits. Minimal bibasilar subsegmental atelectasis is noted. The visualized skeletal structures are unremarkable. IMPRESSION: Minimal bibasilar subsegmental atelectasis. Electronically Signed   By: Lynwood Landy Raddle M.D.   On: 12/02/2023 10:19      LOS: 2 days    Vernal Alstrom, MD [facility] Available via Epic secure chat 7am-7pm After these hours, please refer to  coverage provider listed on [email or web address] 12/04/2023, 8:59 AM

## 2023-12-04 NOTE — Evaluation (Signed)
 Clinical/Bedside Swallow Evaluation Patient Details  Name: Tammy Cordova MRN: [id number] Date of Birth: August 28, 1966  Today's Date: 12/04/2023 Time: SLP Start Time (ACUTE ONLY): 9063 SLP Stop Time (ACUTE ONLY): 0945 SLP Time Calculation (min) (ACUTE ONLY): 9 min  Past Medical History:  Past Medical History:  Diagnosis Date   COPD (chronic obstructive pulmonary disease) (HCC)    Pneumonia    Past Surgical History:  Past Surgical History:  Procedure Laterality Date   BREAST BIOPSY Left 04/2016   REACTIVE LYMPHOID HYPERPLASIA    BUNIONECTOMY     bilateral   IR IMAGING GUIDED PORT INSERTION  12/03/2023   HPI:  y.o. female with medical history significant for COPD, HTN, RA, and recently diagnosed invasive RUL lung mass (pathology consistent with small cell carcinoma) who presented to the ED for evaluation of fatigue and cough.    Assessment / Plan / Recommendation  Clinical Impression  Pts swallow presents grossly within functional limits. Pt reports her prior symptoms of tightness in throat and coughing have resolved. This was likely attributed to location of mass pressing against pts trachea. Pt has since begun treatment. Clinical swallow this date largely unremarkable. Mild white coating noted on lingual area (posterior region); notified nurse to coordinate with MD for any indicated interventions for possible thrush. No overt s/sx of aspiration with any PO. Continue regular thin liquid diet. No further ST needs identified.  SLP Visit Diagnosis: Dysphagia, unspecified (R13.10)    Aspiration Risk  No limitations    Diet Recommendation   Thin;Age appropriate regular  Medication Administration: Whole meds with liquid    Other  Recommendations Oral Care Recommendations: Oral care BID    Recommendations for follow up therapy are one component of a multi-disciplinary discharge planning process, led by the attending physician.  Recommendations may be updated based on patient status,  additional functional criteria and insurance authorization.  Follow up Recommendations No SLP follow up      Assistance Recommended at Discharge    Functional Status Assessment Patient has not had a recent decline in their functional status  Frequency and Duration            Prognosis        Swallow Study   General Date of Onset: 12/02/23 HPI: y.o. female with medical history significant for COPD, HTN, RA, and recently diagnosed invasive RUL lung mass (pathology consistent with small cell carcinoma) who presented to the ED for evaluation of fatigue and cough. Type of Study: Bedside Swallow Evaluation Previous Swallow Assessment: none on file Diet Prior to this Study: Regular;Thin liquids (Level 0) Temperature Spikes Noted: No Respiratory Status: Nasal cannula History of Recent Intubation: No Behavior/Cognition: Alert;Cooperative;Pleasant mood Oral Cavity Assessment: Other (comment) (mild white coating; RN notified to follow up with MD for correlation and tx of possible thrush) Oral Care Completed by SLP: Yes Oral Cavity - Dentition: Adequate natural dentition Vision: Functional for self-feeding Self-Feeding Abilities: Able to feed self Patient Positioning: Upright in bed Baseline Vocal Quality: Normal Volitional Swallow: Able to elicit    Oral/Motor/Sensory Function Overall Oral Motor/Sensory Function: Within functional limits   Ice Chips Ice chips: Not tested   Thin Liquid Thin Liquid: Within functional limits Presentation: Cup;Straw    Nectar Thick Nectar Thick Liquid: Not tested   Honey Thick Honey Thick Liquid: Not tested   Puree Puree: Within functional limits   Solid     Solid: Within functional limits      Mitzie HUNT MA, CCC-SLP Acute Rehabilitation Services  12/04/2023,9:51 AM

## 2023-12-04 NOTE — Progress Notes (Signed)
 PT Cancellation Note  Patient Details Name: Tammy Cordova MRN: [id number] DOB: 03-09-1966   Cancelled Treatment:    Reason Eval/Treat Not Completed: Patient not medically ready. Pt currently receiving chemo in room.  Will check back as schedule permits.   [name] Sharps 12/04/2023, 12:16 PM

## 2023-12-05 ENCOUNTER — Other Ambulatory Visit: Payer: Self-pay

## 2023-12-05 DIAGNOSIS — C3411 Malignant neoplasm of upper lobe, right bronchus or lung: Secondary | ICD-10-CM | POA: Diagnosis not present

## 2023-12-05 LAB — CBC WITH DIFFERENTIAL/PLATELET
Abs Immature Granulocytes: 0.03 10*3/uL (ref 0.00–0.07)
Basophils Absolute: 0 10*3/uL (ref 0.0–0.1)
Basophils Relative: 0 %
Eosinophils Absolute: 0 10*3/uL (ref 0.0–0.5)
Eosinophils Relative: 0 %
HCT: 36.6 % (ref 36.0–46.0)
Hemoglobin: 11.8 g/dL — ABNORMAL LOW (ref 12.0–15.0)
Immature Granulocytes: 1 %
Lymphocytes Relative: 7 %
Lymphs Abs: 0.2 10*3/uL — ABNORMAL LOW (ref 0.7–4.0)
MCH: 27.8 pg (ref 26.0–34.0)
MCHC: 32.2 g/dL (ref 30.0–36.0)
MCV: 86.3 fL (ref 80.0–100.0)
Monocytes Absolute: 0.1 10*3/uL (ref 0.1–1.0)
Monocytes Relative: 4 %
Neutro Abs: 2.6 10*3/uL (ref 1.7–7.7)
Neutrophils Relative %: 88 %
Platelets: 287 10*3/uL (ref 150–400)
RBC: 4.24 MIL/uL (ref 3.87–5.11)
RDW: 16.3 % — ABNORMAL HIGH (ref 11.5–15.5)
WBC: 3 10*3/uL — ABNORMAL LOW (ref 4.0–10.5)
nRBC: 0 % (ref 0.0–0.2)

## 2023-12-05 LAB — COMPREHENSIVE METABOLIC PANEL
ALT: 34 U/L (ref 0–44)
AST: 64 U/L — ABNORMAL HIGH (ref 15–41)
Albumin: 2.3 g/dL — ABNORMAL LOW (ref 3.5–5.0)
Alkaline Phosphatase: 83 U/L (ref 38–126)
Anion gap: 9 (ref 5–15)
BUN: 35 mg/dL — ABNORMAL HIGH (ref 6–20)
CO2: 17 mmol/L — ABNORMAL LOW (ref 22–32)
Calcium: 5.9 mg/dL — CL (ref 8.9–10.3)
Chloride: 112 mmol/L — ABNORMAL HIGH (ref 98–111)
Creatinine, Ser: 0.9 mg/dL (ref 0.44–1.00)
GFR, Estimated: >60 mL/min (ref >60)
Glucose, Bld: 237 mg/dL — ABNORMAL HIGH (ref 70–99)
Potassium: 4.1 mmol/L (ref 3.5–5.1)
Sodium: 138 mmol/L (ref 135–145)
Total Bilirubin: 0.3 mg/dL (ref 0.0–1.2)
Total Protein: 7.5 g/dL (ref 6.5–8.1)

## 2023-12-05 LAB — PHOSPHORUS: Phosphorus: 1.6 mg/dL — ABNORMAL LOW (ref 2.5–4.6)

## 2023-12-05 LAB — MAGNESIUM: Magnesium: 3.1 mg/dL — ABNORMAL HIGH (ref 1.7–2.4)

## 2023-12-05 MED ORDER — TRAZODONE HCL 50 MG PO TABS
25.0000 mg | ORAL_TABLET | Freq: Every evening | ORAL | Status: AC | PRN
  Administered 2023-12-05 – 2023-12-06 (×2): 25 mg via ORAL
  Filled 2023-12-05 (×2): qty 1

## 2023-12-05 MED ORDER — K PHOS MONO-SOD PHOS DI & MONO 155-852-130 MG PO TABS
250.0000 mg | ORAL_TABLET | Freq: Two times a day (BID) | ORAL | Status: DC
  Administered 2023-12-05 – 2023-12-06 (×2): 250 mg via ORAL
  Filled 2023-12-05 (×5): qty 1

## 2023-12-05 MED ORDER — CALCIUM GLUCONATE-NACL 2-0.675 GM/100ML-% IV SOLN
2.0000 g | Freq: Once | INTRAVENOUS | Status: AC
  Administered 2023-12-05: 2000 mg via INTRAVENOUS
  Filled 2023-12-05: qty 100

## 2023-12-05 NOTE — Evaluation (Signed)
 Occupational Therapy Evaluation Patient Details Name: Tammy Cordova MRN: [id number] DOB: May 04, 1966 Today's Date: 12/05/2023   History of Present Illness Tammy Cordova is a 58 y.o. female presented to the hospital with fatigue and cough which was progressive in nature with decreased oral intake and dysphagia.  Patient had plans for initiation of radiation on 1/6 and had undergone biopsy 12/27.  Pathology was notable for metastatic high-grade neuroendocrine tumor most consistent with small cell carcinoma.PMH: COPD, HTN, RA, and recently diagnosed invasive RUL lung mass (pathology consistent with small cell carcinoma)   Clinical Impression   Patient is a 58 year old female who was admitted for above. Patient was living at home with significant other prior level. Currently, patient is CGA for mobility in room and min A for LB bathing/dressing tasks with decreased functional activity tolerance. Mobility specialist request was placed on this date. Patient would continue to benefit from skilled OT services at this time while admitted and after d/c to address noted deficits in order to improve overall safety and independence in ADLs. Plan is to d/c home with family support and no OT follow up.      If plan is discharge home, recommend the following: A little help with walking and/or transfers;A little help with bathing/dressing/bathroom;Assistance with cooking/housework    Functional Status Assessment  Patient has had a recent decline in their functional status and demonstrates the ability to make significant improvements in function in a reasonable and predictable amount of time.  Equipment Recommendations  None recommended by OT       Precautions / Restrictions Precautions Precautions: Fall Precaution Comments: o2 at time of eval Restrictions Weight Bearing Restrictions Per Provider Order: No      Mobility Bed Mobility Overal bed mobility: Needs Assistance Bed Mobility: Supine to Sit,  Sit to Supine     Supine to sit: Supervision Sit to supine: Supervision           Balance Overall balance assessment: Mild deficits observed, not formally tested           ADL either performed or assessed with clinical judgement   ADL Overall ADL's : Needs assistance/impaired Eating/Feeding: Set up;Sitting   Grooming: Wash/dry hands;Standing;Supervision/safety   Upper Body Bathing: Contact guard assist;Sitting   Lower Body Bathing: Sitting/lateral leans;Minimal assistance   Upper Body Dressing : Contact guard assist;Sitting   Lower Body Dressing: Contact guard assist;Sitting/lateral leans   Toilet Transfer: Contact guard assist;Ambulation   Toileting- Clothing Manipulation and Hygiene: Sitting/lateral lean;Supervision/safety               Vision Baseline Vision/History: 1 Wears glasses              Pertinent Vitals/Pain Pain Assessment Pain Assessment: No/denies pain     Extremity/Trunk Assessment Upper Extremity Assessment Upper Extremity Assessment: Overall WFL for tasks assessed   Lower Extremity Assessment Lower Extremity Assessment: Defer to PT evaluation          Cognition Arousal: Alert Behavior During Therapy: WFL for tasks assessed/performed Overall Cognitive Status: Within Functional Limits for tasks assessed                    Home Living Family/patient expects to be discharged to:: Private residence Living Arrangements: Spouse/significant other Available Help at Discharge: Family;Available PRN/intermittently Type of Home: House Home Access: Level entry     Home Layout: One level     Bathroom Shower/Tub: Tub/shower unit         Home Equipment:  None          Prior Functioning/Environment Prior Level of Function : Independent/Modified Independent                        OT Problem List: Impaired balance (sitting and/or standing);Decreased activity tolerance;Cardiopulmonary status limiting activity       OT Treatment/Interventions: Self-care/ADL training;Patient/family education;Balance training;Therapeutic activities    OT Goals(Current goals can be found in the care plan section) Acute Rehab OT Goals Patient Stated Goal: to go home OT Goal Formulation: With patient Time For Goal Achievement: 12/19/23 Potential to Achieve Goals: Fair  OT Frequency: Min 1X/week       AM-PAC OT 6 Clicks Daily Activity     Outcome Measure Help from another person eating meals?: None Help from another person taking care of personal grooming?: None Help from another person toileting, which includes using toliet, bedpan, or urinal?: A Little Help from another person bathing (including washing, rinsing, drying)?: A Little Help from another person to put on and taking off regular upper body clothing?: A Little Help from another person to put on and taking off regular lower body clothing?: A Little 6 Click Score: 20   End of Session Nurse Communication: Mobility status  Activity Tolerance: Patient tolerated treatment well Patient left: in bed;with call bell/phone within reach  OT Visit Diagnosis: Unsteadiness on feet (R26.81);Other abnormalities of gait and mobility (R26.89)                Time: 8592-8571 OT Time Calculation (min): 21 min Charges:  OT General Charges $OT Visit: 1 Visit OT Evaluation $OT Eval Low Complexity: 1 Low  Tammy Cordova OTR/L, MS Acute Rehabilitation Department Office# [phone]   Tammy Cordova 12/05/2023, 3:10 PM

## 2023-12-05 NOTE — Progress Notes (Signed)
 PROGRESS NOTE    Tammy Cordova  FMW:[id number] DOB: 12-12-1965 DOA: 12/02/2023 PCP: Vernon Velna SAUNDERS, MD    Brief Narrative:   Tammy Cordova is a 58 y.o. female with medical history significant for COPD, HTN, RA, and recently diagnosed invasive RUL lung mass (pathology consistent with small cell carcinoma) presented to the hospital with fatigue and cough which was progressive in nature with decreased oral intake and dysphagia..  Patient had plans for initiation of radiation on 1 6 and had undergone biopsy 1227.  Pathology was notable for metastatic high-grade neuroendocrine tumor most consistent with small cell carcinoma.  In the ED patient was hypoxic with pulse ox of 89% and was put on 2 L of oxygen.  Labs showed WBC elevated at 13.5 with a low albumin at 3.2.  COVID influenza and RSV was negative.  CTA chest was negative for PE but stable right upper lobe mass with mediastinal invasion and deviation of trachea was noted.  Patient was given IV fluids calcium  gluconate DuoNebs and case was discussed with oncology who recommended medical admission to initiate chemotherapy.    During hospitalization, patient has undergone port placement for chemotherapy initiation and has been started on chemotherapy.  Assessment and Plan:  Small cell lung cancer, invasive RUL lung mass Acute respiratory failure with hypoxia: Recent findings of invasive RUL lung mass infiltrating mediastinum with metastatic adenopathy.  Pathology consistent with small cell carcinoma.  She has developed hypoxia with new 2 L [place] requirement on admission.  CT chest negative for PE and otherwise shows stable malignant findings from PET/CT.  Oncology on board.  Status post port placement for chemotherapy which has been initiated during hospitalization.. Radiation oncology have started on urgent radiation treatment on 12/02/2023.  Uric acid of 6.6.  Follow oncology recommendation.   Failure to thrive: Patient with progressive  generalized weakness, fatigue, lack of appetite.  She reports some recent dysphagia.  Continue oral hydration PT OT evaluation and speech therapy.  COPD: Continue maintenance Symbicort , DuoNebs as needed.  Patient feels okay.   Hypocalcemia: Has been receiving calcium  gluconate but asymptomatic.  Receiving 2 g of calcium  gluconate today.  Hypophosphatemia.  Phosphorus of 1.3.  Will replenish with potassium phosphate .  Check levels in AM.   Hypertension: Normotensive on admission, hold valsartan for now.  Latest blood pressure of 112/64   Left thyroid  nodule: 1.5 cm left thyroid  nodule seen again on CT imaging.  Lesion was hypermetabolic on PET/CT 11/30/2023.  Follow-up nonemergent thyroid  ultrasound recommended.     DVT prophylaxis: enoxaparin  (LOVENOX ) injection 40 mg Start: 12/02/23 2200   Code Status:     Code Status: Full Code  Disposition: Likely home when okay with oncology.  Status is: Inpatient  Remains inpatient appropriate because: Small cell cancer for chemotherapy and radiation, pending clinical improvement   Family Communication: Spoke with the patient's husband at bedside on 12/03/2023  Consultants:  Oncology Interventional radiology Radiation oncology  Procedures:  Left internal jugular PowerPort placement on 12/03/2023 Radiation treatment  Antimicrobials:  None  Anti-infectives (From admission, onward)    None        Subjective: Today, patient was seen and examined at bedside.  Patient states that feels better now overall.  Has mild cough but no dyspnea pain nausea vomiting fever or chills.    Objective: Vitals:   12/04/23 1334 12/04/23 2021 12/04/23 2026 12/05/23 0432  BP: 111/66 107/70  122/69  Pulse: 93 82  75  Resp: 18 18  18  Temp: 98.3 F (36.8 C) 98.2 F (36.8 C)  97.9 F (36.6 C)  TempSrc: Oral Oral  Oral  SpO2: 96% 95% 95% 96%  Weight:      Height:        Intake/Output Summary (Last 24 hours) at 12/05/2023 0954 Last data filed  at 12/05/2023 0930 Gross per 24 hour  Intake 611.81 ml  Output --  Net 611.81 ml   Filed Weights   12/02/23 0842  Weight: 70.3 kg    Physical Examination: Body mass index is 25.02 kg/m.   General:  Average built, not in obvious distress, on nasal cannula oxygen, appears chronically ill, HENT:   No scleral pallor or icterus noted. Oral mucosa is moist.  Chest:    Diminished breath sounds bilaterally.  Port-A-Cath in place, left chest wall CVS: S1 &S2 heard. No murmur.   Abdomen: Soft, nontender, nondistended.  Bowel sounds are heard.   Extremities: No cyanosis, clubbing or edema.  Peripheral pulses are palpable. Psych: Alert, awake and oriented, normal mood CNS:  No cranial nerve deficits.  Power equal in all extremities.  Generalized weakness noted Skin: Warm and dry.  No rashes noted.  Data Reviewed:   CBC: Recent Labs  Lab 12/02/23 0925 12/03/23 0545 12/04/23 0500 12/05/23 0500  WBC 13.5* 10.4 7.1 3.0*  NEUTROABS  --   --  5.9 2.6  HGB 15.2* 12.8 11.6* 11.8*  HCT 46.8* 39.4 37.4 36.6  MCV 87.0 86.4 86.6 86.3  PLT 283 240 259 287    Basic Metabolic Panel: Recent Labs  Lab 12/02/23 0845 12/03/23 0545 12/04/23 0500 12/05/23 0500  NA 133* 138 139 138  K 3.9 4.8 4.0 4.1  CL 107 111 112* 112*  CO2 16* 18* 20* 17*  GLUCOSE 142* 102* 111* 237*  BUN 22* 40* 35* 35*  CREATININE 1.12* 1.24* 1.02* 0.90  CALCIUM  6.2* 5.7* 5.8* 5.9*  MG  --   --  2.6* 3.1*  PHOS  --   --  2.3* 1.6*    Liver Function Tests: Recent Labs  Lab 12/02/23 0845 12/03/23 0545 12/04/23 0500 12/05/23 0500  AST  --  28 19 64*  ALT  --  25 18 34  ALKPHOS  --  63 62 83  BILITOT  --  0.8 0.6 0.3  PROT  --  7.6 7.1 7.5  ALBUMIN 3.2* 2.4* 2.2* 2.3*     Radiology Studies: IR IMAGING GUIDED PORT INSERTION Result Date: 12/03/2023 CLINICAL DATA:  Worsening symptomatic small cell carcinoma of the right lung with need for emergent chemotherapy. EXAM: IMPLANTED PORT A CATH PLACEMENT WITH  ULTRASOUND AND FLUOROSCOPIC GUIDANCE ANESTHESIA/SEDATION: Moderate (conscious) sedation was employed during this procedure. A total of Versed  1.0 mg and Fentanyl  100 mcg was administered intravenously. Moderate Sedation Time: 36 minutes. The patient's level of consciousness and vital signs were monitored continuously by radiology nursing throughout the procedure under my direct supervision. FLUOROSCOPY: 30 seconds.  1.0 mGy. PROCEDURE: The procedure, risks, benefits, and alternatives were explained to the patient. Questions regarding the procedure were encouraged and answered. The patient understands and consents to the procedure. A time-out was performed prior to initiating the procedure. Ultrasound was utilized to confirm patency of the left internal jugular vein. Under ultrasound image was saved and recorded. The left neck and chest were prepped with chlorhexidine  in a sterile fashion, and a sterile drape was applied covering the operative field. Maximum barrier sterile technique with sterile gowns and gloves were used for the procedure. Local  anesthesia was provided with 1% lidocaine . After creating a small venotomy incision, a 21 gauge needle was advanced into the left internal jugular vein under direct, real-time ultrasound guidance. Ultrasound image documentation was performed. After securing guidewire access, an 8 Fr dilator was placed. A J-wire was kinked to measure appropriate catheter length. A subcutaneous port pocket was then created along the upper chest wall utilizing sharp and blunt dissection. Portable cautery was utilized. The pocket was irrigated with sterile saline. A single lumen power injectable port was chosen for placement. The 8 Fr catheter was tunneled from the port pocket site to the venotomy incision. The port was placed in the pocket. External catheter was trimmed to appropriate length based on guidewire measurement. At the venotomy, an 8 Fr peel-away sheath was placed over a guidewire.  The catheter was then placed through the sheath and the sheath removed. Final catheter positioning was confirmed and documented with a fluoroscopic spot image. The port was accessed with a needle and aspirated and flushed with heparinized saline. The access needle was left in place. The venotomy and port pocket incisions were closed with subcutaneous 3-0 Monocryl and subcuticular 4-0 Vicryl. Dermabond was applied to both incisions. COMPLICATIONS: COMPLICATIONS None FINDINGS: After catheter placement, the tip lies at the cavo-atrial junction. The catheter aspirates normally and is ready for immediate use. IMPRESSION: Placement of single lumen port a cath via left internal jugular vein. The catheter tip lies at the cavo-atrial junction. A power injectable port a cath was placed and is ready for immediate use. Electronically Signed   By: Marcey Moan M.D.   On: 12/03/2023 15:23      LOS: 3 days    Vernal Alstrom, MD [facility] Available via Epic secure chat 7am-7pm After these hours, please refer to coverage provider listed on [email or web address] 12/05/2023, 9:54 AM

## 2023-12-05 NOTE — Plan of Care (Signed)
  Problem: Education: Goal: Knowledge of General Education information will improve Description: Including pain rating scale, medication(s)/side effects and non-pharmacologic comfort measures Outcome: Progressing   Problem: Health Behavior/Discharge Planning: Goal: Ability to manage health-related needs will improve Outcome: Progressing   Problem: Clinical Measurements: Goal: Ability to maintain clinical measurements within normal limits will improve Outcome: Progressing Goal: Will remain free from infection Outcome: Progressing Goal: Diagnostic test results will improve Outcome: Progressing Goal: Respiratory complications will improve Outcome: Progressing Goal: Cardiovascular complication will be avoided Outcome: Progressing   Problem: Activity: Goal: Risk for activity intolerance will decrease Outcome: Progressing   Problem: Nutrition: Goal: Adequate nutrition will be maintained Outcome: Progressing   Problem: Coping: Goal: Level of anxiety will decrease Outcome: Progressing   Problem: Elimination: Goal: Will not experience complications related to bowel motility Outcome: Progressing Goal: Will not experience complications related to urinary retention Outcome: Progressing   Problem: Pain Management: Goal: General experience of comfort will improve Outcome: Progressing   Problem: Safety: Goal: Ability to remain free from injury will improve Outcome: Progressing   Problem: Skin Integrity: Goal: Risk for impaired skin integrity will decrease Outcome: Progressing   Problem: Education: Goal: Knowledge of the prescribed therapy will improve Outcome: Progressing   Problem: Bowel/Gastric: Goal: Occurrences of nausea, vomiting, and/or diarrhea will decrease Outcome: Progressing   Problem: Coping: Goal: Level of anxiety will decrease Outcome: Progressing Goal: Ability to identify and develop effective coping behavior will improve Outcome: Progressing    Problem: Nutritional: Goal: Will achieve and/or maintain adequate nutritional intake Outcome: Progressing   Problem: Skin Integrity: Goal: Risk for impaired skin integrity will decrease Outcome: Progressing   Problem: Urinary Elimination: Goal: Complications of radiation-induced cystitis will be minimized/avoided Outcome: Progressing   Problem: Fatigue: Goal: Expression of feelings of increased energy will improve Outcome: Progressing

## 2023-12-06 ENCOUNTER — Encounter: Payer: 59 | Admitting: Radiation Oncology

## 2023-12-06 ENCOUNTER — Ambulatory Visit
Admit: 2023-12-06 | Discharge: 2023-12-06 | Disposition: A | Discharge status: Home / Self Care | Payer: 59 | Attending: Radiation Oncology | Admitting: Radiation Oncology

## 2023-12-06 ENCOUNTER — Other Ambulatory Visit: Payer: Self-pay

## 2023-12-06 ENCOUNTER — Encounter (HOSPITAL_COMMUNITY): Payer: Self-pay | Admitting: Hematology & Oncology

## 2023-12-06 DIAGNOSIS — J9601 Acute respiratory failure with hypoxia: Secondary | ICD-10-CM

## 2023-12-06 DIAGNOSIS — C3411 Malignant neoplasm of upper lobe, right bronchus or lung: Secondary | ICD-10-CM | POA: Diagnosis not present

## 2023-12-06 DIAGNOSIS — R531 Weakness: Secondary | ICD-10-CM

## 2023-12-06 LAB — CBC WITH DIFFERENTIAL/PLATELET
Abs Immature Granulocytes: 0.05 10*3/uL (ref 0.00–0.07)
Basophils Absolute: 0 10*3/uL (ref 0.0–0.1)
Basophils Relative: 0 %
Eosinophils Absolute: 0 10*3/uL (ref 0.0–0.5)
Eosinophils Relative: 0 %
HCT: 36.8 % (ref 36.0–46.0)
Hemoglobin: 11.7 g/dL — ABNORMAL LOW (ref 12.0–15.0)
Immature Granulocytes: 1 %
Lymphocytes Relative: 6 %
Lymphs Abs: 0.3 10*3/uL — ABNORMAL LOW (ref 0.7–4.0)
MCH: 27.5 pg (ref 26.0–34.0)
MCHC: 31.8 g/dL (ref 30.0–36.0)
MCV: 86.4 fL (ref 80.0–100.0)
Monocytes Absolute: 0.1 10*3/uL (ref 0.1–1.0)
Monocytes Relative: 2 %
Neutro Abs: 5.5 10*3/uL (ref 1.7–7.7)
Neutrophils Relative %: 91 %
Platelets: 322 10*3/uL (ref 150–400)
RBC: 4.26 MIL/uL (ref 3.87–5.11)
RDW: 16.4 % — ABNORMAL HIGH (ref 11.5–15.5)
WBC: 6 10*3/uL (ref 4.0–10.5)
nRBC: 0 % (ref 0.0–0.2)

## 2023-12-06 LAB — RAD ONC ARIA SESSION SUMMARY
Course Elapsed Days: 4
Plan Fractions Treated to Date: 3
Plan Prescribed Dose Per Fraction: 2.5 Gy
Plan Total Fractions Prescribed: 15
Plan Total Prescribed Dose: 37.5 Gy
Reference Point Dosage Given to Date: 7.5 Gy
Reference Point Session Dosage Given: 2.5 Gy
Session Number: 3

## 2023-12-06 LAB — COMPREHENSIVE METABOLIC PANEL
ALT: 32 U/L (ref 0–44)
AST: 40 U/L (ref 15–41)
Albumin: 2.1 g/dL — ABNORMAL LOW (ref 3.5–5.0)
Alkaline Phosphatase: 74 U/L (ref 38–126)
Anion gap: 7 (ref 5–15)
BUN: 31 mg/dL — ABNORMAL HIGH (ref 6–20)
CO2: 20 mmol/L — ABNORMAL LOW (ref 22–32)
Calcium: 5.9 mg/dL — CL (ref 8.9–10.3)
Chloride: 113 mmol/L — ABNORMAL HIGH (ref 98–111)
Creatinine, Ser: 0.77 mg/dL (ref 0.44–1.00)
GFR, Estimated: >60 mL/min (ref >60)
Glucose, Bld: 195 mg/dL — ABNORMAL HIGH (ref 70–99)
Potassium: 4.2 mmol/L (ref 3.5–5.1)
Sodium: 140 mmol/L (ref 135–145)
Total Bilirubin: 0.3 mg/dL (ref 0.0–1.2)
Total Protein: 6.7 g/dL (ref 6.5–8.1)

## 2023-12-06 LAB — MAGNESIUM: Magnesium: 2.7 mg/dL — ABNORMAL HIGH (ref 1.7–2.4)

## 2023-12-06 LAB — PHOSPHORUS: Phosphorus: 2 mg/dL — ABNORMAL LOW (ref 2.5–4.6)

## 2023-12-06 MED ORDER — SODIUM CHLORIDE 0.9 % IV SOLN
100.0000 mg/m2 | Freq: Once | INTRAVENOUS | Status: AC
  Administered 2023-12-06: 200 mg via INTRAVENOUS
  Filled 2023-12-06: qty 10

## 2023-12-06 MED ORDER — SODIUM CHLORIDE 0.9 % IV SOLN
10.0000 mg | Freq: Once | INTRAVENOUS | Status: AC
  Administered 2023-12-06: 10 mg via INTRAVENOUS
  Filled 2023-12-06: qty 1

## 2023-12-06 MED ORDER — K PHOS MONO-SOD PHOS DI & MONO 155-852-130 MG PO TABS
250.0000 mg | ORAL_TABLET | Freq: Two times a day (BID) | ORAL | Status: DC
  Administered 2023-12-06: 250 mg via ORAL
  Filled 2023-12-06 (×2): qty 1

## 2023-12-06 NOTE — Progress Notes (Signed)
 Physical Therapy Treatment Patient Details Name: Tammy Cordova MRN: [id number] DOB: 26-Apr-1966 Today's Date: 12/06/2023   History of Present Illness Tammy Cordova is a 58 y.o. female presented to the hospital with fatigue and cough which was progressive in nature with decreased oral intake and dysphagia.  Patient had plans for initiation of radiation on 1/6 and had undergone biopsy 12/27.  Pathology was notable for metastatic high-grade neuroendocrine tumor most consistent with small cell carcinoma.PMH: COPD, HTN, RA, and recently diagnosed invasive RUL lung mass (pathology consistent with small cell carcinoma)    PT Comments   Pt admitted with above diagnosis.  Pt currently with functional limitations due to the deficits listed below (see PT Problem List). Pt arrived prior to lunch and pt undergoing chemo and nurse made PT aware pt was scheduled for radiation at 2:15 today. Pt agreeable to therapy intervention this evening. Pt is mod I for supine <> sit with HOB elevated, S for transfer tasks no AD, gait tasks CGA to close S with mild instability noted x 2 and pt able to implement self recovery strategies with pt demonstrating good gait tolerance of 300 feet. Pt elected to return to bed, all needs in place and visitor present. Pt will benefit from acute skilled PT to increase their independence and safety with mobility to allow discharge.      If plan is discharge home, recommend the following: A little help with walking and/or transfers;A little help with bathing/dressing/bathroom   Can travel by private vehicle        Equipment Recommendations  Rolling walker (2 wheels);BSC/3in1    Recommendations for Other Services       Precautions / Restrictions Precautions Precautions: Fall Restrictions Weight Bearing Restrictions Per Provider Order: No     Mobility  Bed Mobility Overal bed mobility: Modified Independent Bed Mobility: Supine to Sit, Sit to Supine     Supine to sit:  Modified independent (Device/Increase time) Sit to supine: Modified independent (Device/Increase time)        Transfers Overall transfer level: Needs assistance Equipment used: None Transfers: Sit to/from Stand Sit to Stand: Supervision           General transfer comment: min cues    Ambulation/Gait Ambulation/Gait assistance: Supervision, Contact guard assist Gait Distance (Feet): 300 Feet Assistive device: None Gait Pattern/deviations: Step-through pattern, Narrow base of support       General Gait Details: pt required CGA intermittently with 2 episodes of LOB and pt able to implement self recovery stratagies, pt demonstrated lateral sway with gait tasks today pt required one standing theraputic rest break, no reports of SOB, pain or dizziness   Stairs             Wheelchair Mobility     Tilt Bed    Modified Rankin (Stroke Patients Only)       Balance Overall balance assessment: Mild deficits observed, not formally tested                                          Cognition Arousal: Alert Behavior During Therapy: WFL for tasks assessed/performed Overall Cognitive Status: Within Functional Limits for tasks assessed  Exercises      General Comments General comments (skin integrity, edema, etc.): pt indicated radiation was difficult today with issues with L port and pt having a long day      Pertinent Vitals/Pain Pain Assessment Pain Assessment: No/denies pain    Home Living                          Prior Function            PT Goals (current goals can now be found in the care plan section) Acute Rehab PT Goals Patient Stated Goal: To walk in and out of radiation. I don't want to have to use a wheelchair. Time For Goal Achievement: 12/18/23 Potential to Achieve Goals: Good Progress towards PT goals: Progressing toward goals    Frequency    Min  1X/week      PT Plan      Co-evaluation              AM-PAC PT 6 Clicks Mobility   Outcome Measure  Help needed turning from your back to your side while in a flat bed without using bedrails?: None Help needed moving from lying on your back to sitting on the side of a flat bed without using bedrails?: None Help needed moving to and from a bed to a chair (including a wheelchair)?: A Little Help needed standing up from a chair using your arms (e.g., wheelchair or bedside chair)?: A Little Help needed to walk in hospital room?: A Little Help needed climbing 3-5 steps with a railing? : A Little 6 Click Score: 20    End of Session Equipment Utilized During Treatment: Gait belt Activity Tolerance: Patient limited by fatigue Patient left: with call bell/phone within reach;in bed;with family/visitor present Nurse Communication: Mobility status PT Visit Diagnosis: Muscle weakness (generalized) (M62.81);Difficulty in walking, not elsewhere classified (R26.2)     Time: 8164-8153 PT Time Calculation (min) (ACUTE ONLY): 11 min  Charges:    $Gait Training: 8-22 mins PT General Charges $$ ACUTE PT VISIT: 1 Visit                     Tammy Cordova, PT Acute Rehab    Tammy Cordova Tammy Cordova 12/06/2023, 6:59 PM

## 2023-12-06 NOTE — Plan of Care (Signed)
  Problem: Education: Goal: Knowledge of General Education information will improve Description: Including pain rating scale, medication(s)/side effects and non-pharmacologic comfort measures Outcome: Progressing   Problem: Health Behavior/Discharge Planning: Goal: Ability to manage health-related needs will improve Outcome: Progressing   Problem: Clinical Measurements: Goal: Ability to maintain clinical measurements within normal limits will improve Outcome: Progressing Goal: Will remain free from infection Outcome: Progressing Goal: Diagnostic test results will improve Outcome: Progressing Goal: Respiratory complications will improve Outcome: Progressing Goal: Cardiovascular complication will be avoided Outcome: Progressing   Problem: Activity: Goal: Risk for activity intolerance will decrease Outcome: Progressing   Problem: Nutrition: Goal: Adequate nutrition will be maintained Outcome: Progressing   Problem: Coping: Goal: Level of anxiety will decrease Outcome: Progressing   Problem: Elimination: Goal: Will not experience complications related to bowel motility Outcome: Progressing Goal: Will not experience complications related to urinary retention Outcome: Progressing   Problem: Pain Management: Goal: General experience of comfort will improve Outcome: Progressing   Problem: Safety: Goal: Ability to remain free from injury will improve Outcome: Progressing   Problem: Skin Integrity: Goal: Risk for impaired skin integrity will decrease Outcome: Progressing   Problem: Education: Goal: Knowledge of the prescribed therapy will improve Outcome: Progressing   Problem: Bowel/Gastric: Goal: Occurrences of nausea, vomiting, and/or diarrhea will decrease Outcome: Progressing   Problem: Coping: Goal: Level of anxiety will decrease Outcome: Progressing Goal: Ability to identify and develop effective coping behavior will improve Outcome: Progressing    Problem: Nutritional: Goal: Will achieve and/or maintain adequate nutritional intake Outcome: Progressing   Problem: Skin Integrity: Goal: Risk for impaired skin integrity will decrease Outcome: Progressing   Problem: Urinary Elimination: Goal: Complications of radiation-induced cystitis will be minimized/avoided Outcome: Progressing   Problem: Fatigue: Goal: Expression of feelings of increased energy will improve Outcome: Progressing

## 2023-12-06 NOTE — Progress Notes (Signed)
 [facility]  PROGRESS NOTE  Tammy Cordova FMW:[id number] DOB: 1966-10-28 DOA: 12/02/2023 PCP: Vernon Velna SAUNDERS, MD   Brief HPI:   58 y.o. female with medical history significant for COPD, HTN, RA, and recently diagnosed invasive RUL lung mass (pathology consistent with small cell carcinoma) presented to the hospital with fatigue and cough which was progressive in nature with decreased oral intake and dysphagia..  Patient had plans for initiation of radiation on 1 6 and had undergone biopsy 1227.  Pathology was notable for metastatic high-grade neuroendocrine tumor most consistent with small cell carcinoma.  In the ED patient was hypoxic with pulse ox of 89% and was put on 2 L of oxygen.  Labs showed WBC elevated at 13.5 with a low albumin at 3.2.  COVID influenza and RSV was negative.  CTA chest was negative for PE but stable right upper lobe mass with mediastinal invasion and deviation of trachea was noted.  Patient was given IV fluids calcium  gluconate DuoNebs and case was discussed with oncology who recommended medical admission to initiate chemotherapy.     During hospitalization, patient has undergone port placement for chemotherapy initiation and has been started on chemotherapy.    Assessment/Plan:    Small cell lung cancer, invasive RUL lung mass Acute respiratory failure with hypoxia: Recent findings of invasive RUL lung mass infiltrating mediastinum with metastatic adenopathy.  Pathology consistent with small cell carcinoma.  She has developed hypoxia with new 2 L [place] requirement on admission.  CT chest negative for PE and otherwise shows stable malignant findings from PET/CT.  Oncology on board.  Status post port placement for chemotherapy which has been initiated during hospitalization.. Radiation oncology have started on urgent radiation treatment on 12/02/2023.  Uric acid of 6.6.  Follow oncology recommendation.   Failure to thrive: Patient with progressive generalized weakness,  fatigue, lack of appetite.  She reports some recent dysphagia.  Continue oral hydration PT OT evaluation and speech therapy.   COPD: Continue maintenance Symbicort , DuoNebs as needed.     Hypocalcemia: Has been receiving calcium  gluconate but asymptomatic.  Receiving 2 g of calcium  gluconate today.   Hypophosphatemia.   -Phosphorus is 2.0 -Continue replacement of phosphorus -Check phosphorus level in a.m.   Hypertension: Normotensive on admission -Valsartan on hold   Left thyroid  nodule: 1.5 cm left thyroid  nodule seen again on CT imaging.  Lesion was hypermetabolic on PET/CT 11/30/2023.  Follow-up nonemergent thyroid  ultrasound recommended.     Medications     Chlorhexidine  Gluconate Cloth  6 each Topical Daily   enoxaparin  (LOVENOX ) injection  40 mg Subcutaneous Q24H   etoposide   100 mg/m2 (Treatment Plan Recorded) Intravenous Once   feeding supplement  237 mL Oral BID BM   mometasone -formoterol   2 puff Inhalation BID   phosphorus  250 mg Oral BID   sodium chloride  flush  3 mL Intravenous Q12H     Data Reviewed:   CBG:  Recent Labs  Lab 11/30/23 1512  GLUCAP 117*    SpO2: 97 % O2 Flow Rate (L/min): (S) 2 L/min (down to 1 lpm- RN aware) FiO2 (%): 28 %    Vitals:   12/05/23 1850 12/05/23 1955 12/06/23 0453 12/06/23 0846  BP:  121/64 133/73   Pulse:  77 72   Resp:  18 18   Temp:  97.6 F (36.4 C) 97.8 F (36.6 C)   TempSrc:  Oral Oral   SpO2: 97% 98% 98% 97%  Weight:      Height:  Data Reviewed:  Basic Metabolic Panel: Recent Labs  Lab 12/02/23 0845 12/03/23 0545 12/04/23 0500 12/05/23 0500 12/06/23 0620  NA 133* 138 139 138 140  K 3.9 4.8 4.0 4.1 4.2  CL 107 111 112* 112* 113*  CO2 16* 18* 20* 17* 20*  GLUCOSE 142* 102* 111* 237* 195*  BUN 22* 40* 35* 35* 31*  CREATININE 1.12* 1.24* 1.02* 0.90 0.77  CALCIUM  6.2* 5.7* 5.8* 5.9* 5.9*  MG  --   --  2.6* 3.1* 2.7*  PHOS  --   --  2.3* 1.6* 2.0*    CBC: Recent Labs  Lab  12/02/23 0925 12/03/23 0545 12/04/23 0500 12/05/23 0500 12/06/23 0544  WBC 13.5* 10.4 7.1 3.0* 6.0  NEUTROABS  --   --  5.9 2.6 5.5  HGB 15.2* 12.8 11.6* 11.8* 11.7*  HCT 46.8* 39.4 37.4 36.6 36.8  MCV 87.0 86.4 86.6 86.3 86.4  PLT 283 240 259 287 322    LFT Recent Labs  Lab 12/02/23 0845 12/03/23 0545 12/04/23 0500 12/05/23 0500 12/06/23 0620  AST  --  28 19 64* 40  ALT  --  25 18 34 32  ALKPHOS  --  63 62 83 74  BILITOT  --  0.8 0.6 0.3 0.3  PROT  --  7.6 7.1 7.5 6.7  ALBUMIN 3.2* 2.4* 2.2* 2.3* 2.1*     Antibiotics: Anti-infectives (From admission, onward)    None        DVT prophylaxis: Lovenox   Code Status: Full code  Family Communication: No family at bedside   CONSULTS    Subjective   Still complains of shortness of breath.   Objective    Physical Examination:   General-appears in no acute distress Heart-S1-S2, regular, no murmur auscultated Lungs-decreased breath sound bilaterally Abdomen-soft, nontender, no organomegaly Extremities-no edema in the lower extremities Neuro-alert, oriented x3, no focal deficit noted   Status is: Inpatient:             Tammy Cordova S Tammy Cordova   [facility] If 7PM-7AM, please contact night-coverage at [email or web address], Office  [phone]   12/06/2023, 11:05 AM  LOS: 4 days

## 2023-12-06 NOTE — Progress Notes (Signed)
 Date and time results received: 12/06/23 4098  Test: Calcium Critical Value: 6.2  Name of Provider Notified: Chinita Greenland NP  Orders Received? Or Actions Taken?:

## 2023-12-06 NOTE — Progress Notes (Signed)
 So far, chemotherapy is going pretty well.  She has had no problems with nausea or vomiting.  She is eating okay.  There has been no cough.  There is been no shortness of breath.  Her real main problem has been this hypocalcemia.  Yesterday the calcium  was 5.9.  Albumin was 2.3.  There is no level back yet today.  Her blood sugars have been on the high side.  I am sure this is from her steroids.  There is no CBC back yet.  She will get radiation today.  Today will be the last day of her chemotherapy.  I told her that she would not get any more chemotherapy probably for another 2-3 weeks.  We would do this as an outpatient and be able to add in immunotherapy.  She has had no problems with bowel or bladder incontinence.  There is no bleeding.  She is able to sleep better which is really helping her quite a bit.  Her vital signs show temperature of 97.8.  Pulse 72.  Blood pressure 133/73.  Her lungs sound relatively clear bilaterally.  Cardiac exam regular rate and rhythm.  She has no murmurs.  Abdomen is soft.  There are good bowel sounds.  She has no guarding or rebound tenderness.  There is no palpable liver or spleen tip.  Extremity shows no clubbing, cyanosis or edema.  Neurological exam is nonfocal.  Tammy Cordova has extensive stage small cell lung cancer.  She has extensive mediastinal disease that really is a problem for her.  We are doing combination radiation chemotherapy to try to get rapid shrinkage of the mediastinal disease to try to help with her breathing and her quality of life.  Again, today will be the last day of treatment for chemotherapy.  She will continue her radiotherapy.  We have to watch her blood counts.  We will see what her calcium  is.  I know that she is getting incredible care from everybody up on [address].   Jeralyn Crease, MD  [place] [phone]

## 2023-12-07 ENCOUNTER — Encounter (HOSPITAL_COMMUNITY): Payer: Self-pay | Admitting: Hematology & Oncology

## 2023-12-07 ENCOUNTER — Other Ambulatory Visit: Payer: Self-pay

## 2023-12-07 ENCOUNTER — Encounter: Payer: Self-pay | Admitting: *Deleted

## 2023-12-07 ENCOUNTER — Ambulatory Visit
Admit: 2023-12-07 | Discharge: 2023-12-07 | Discharge status: Home / Self Care | Payer: 59 | Attending: Radiation Oncology

## 2023-12-07 DIAGNOSIS — R531 Weakness: Secondary | ICD-10-CM | POA: Diagnosis not present

## 2023-12-07 DIAGNOSIS — C3411 Malignant neoplasm of upper lobe, right bronchus or lung: Secondary | ICD-10-CM | POA: Diagnosis not present

## 2023-12-07 DIAGNOSIS — J9601 Acute respiratory failure with hypoxia: Secondary | ICD-10-CM | POA: Diagnosis not present

## 2023-12-07 LAB — CBC WITH DIFFERENTIAL/PLATELET
Abs Immature Granulocytes: 0.08 10*3/uL — ABNORMAL HIGH (ref 0.00–0.07)
Basophils Absolute: 0 10*3/uL (ref 0.0–0.1)
Basophils Relative: 0 %
Eosinophils Absolute: 0 10*3/uL (ref 0.0–0.5)
Eosinophils Relative: 0 %
HCT: 38.4 % (ref 36.0–46.0)
Hemoglobin: 12 g/dL (ref 12.0–15.0)
Immature Granulocytes: 1 %
Lymphocytes Relative: 7 %
Lymphs Abs: 0.5 10*3/uL — ABNORMAL LOW (ref 0.7–4.0)
MCH: 27.3 pg (ref 26.0–34.0)
MCHC: 31.3 g/dL (ref 30.0–36.0)
MCV: 87.5 fL (ref 80.0–100.0)
Monocytes Absolute: 0 10*3/uL — ABNORMAL LOW (ref 0.1–1.0)
Monocytes Relative: 1 %
Neutro Abs: 6.6 10*3/uL (ref 1.7–7.7)
Neutrophils Relative %: 91 %
Platelets: 322 10*3/uL (ref 150–400)
RBC: 4.39 MIL/uL (ref 3.87–5.11)
RDW: 16.2 % — ABNORMAL HIGH (ref 11.5–15.5)
WBC: 7.2 10*3/uL (ref 4.0–10.5)
nRBC: 0 % (ref 0.0–0.2)

## 2023-12-07 LAB — RAD ONC ARIA SESSION SUMMARY
Course Elapsed Days: 5
Plan Fractions Treated to Date: 4
Plan Prescribed Dose Per Fraction: 2.5 Gy
Plan Total Fractions Prescribed: 15
Plan Total Prescribed Dose: 37.5 Gy
Reference Point Dosage Given to Date: 10 Gy
Reference Point Session Dosage Given: 2.5 Gy
Session Number: 4

## 2023-12-07 LAB — COMPREHENSIVE METABOLIC PANEL
ALT: 35 U/L (ref 0–44)
AST: 37 U/L (ref 15–41)
Albumin: 2.2 g/dL — ABNORMAL LOW (ref 3.5–5.0)
Alkaline Phosphatase: 72 U/L (ref 38–126)
Anion gap: 4 — ABNORMAL LOW (ref 5–15)
BUN: 23 mg/dL — ABNORMAL HIGH (ref 6–20)
CO2: 23 mmol/L (ref 22–32)
Calcium: 5.8 mg/dL — CL (ref 8.9–10.3)
Chloride: 112 mmol/L — ABNORMAL HIGH (ref 98–111)
Creatinine, Ser: 0.59 mg/dL (ref 0.44–1.00)
GFR, Estimated: >60 mL/min (ref >60)
Glucose, Bld: 104 mg/dL — ABNORMAL HIGH (ref 70–99)
Potassium: 4.1 mmol/L (ref 3.5–5.1)
Sodium: 139 mmol/L (ref 135–145)
Total Bilirubin: 0.5 mg/dL (ref 0.0–1.2)
Total Protein: 6.8 g/dL (ref 6.5–8.1)

## 2023-12-07 LAB — PHOSPHORUS: Phosphorus: 2 mg/dL — ABNORMAL LOW (ref 2.5–4.6)

## 2023-12-07 MED ORDER — DRONABINOL 5 MG PO CAPS
5.0000 mg | ORAL_CAPSULE | Freq: Two times a day (BID) | ORAL | Status: DC
  Administered 2023-12-07 (×2): 5 mg via ORAL
  Filled 2023-12-07 (×2): qty 1

## 2023-12-07 MED ORDER — K PHOS MONO-SOD PHOS DI & MONO 155-852-130 MG PO TABS
500.0000 mg | ORAL_TABLET | Freq: Three times a day (TID) | ORAL | Status: AC
Start: 2023-12-07 — End: 2023-12-07
  Administered 2023-12-07 (×3): 500 mg via ORAL
  Filled 2023-12-07 (×3): qty 2

## 2023-12-07 MED ORDER — SODIUM CHLORIDE 0.9 % IV SOLN
4.0000 g | Freq: Once | INTRAVENOUS | Status: AC
  Administered 2023-12-07: 4 g via INTRAVENOUS
  Filled 2023-12-07: qty 40

## 2023-12-07 MED ORDER — SODIUM CHLORIDE 0.9 % IV SOLN
INTRAVENOUS | Status: AC
Start: 2023-12-07 — End: 2023-12-08

## 2023-12-07 NOTE — Progress Notes (Signed)
 [facility]  PROGRESS NOTE  CLEONA DOUBLEDAY FMW:[id number] DOB: [birth year] DOA: 12/02/2023 PCP: Vernon Velna SAUNDERS, MD   Brief HPI:   58 y.o. female with medical history significant for COPD, HTN, RA, and recently diagnosed invasive RUL lung mass (pathology consistent with small cell carcinoma) presented to the hospital with fatigue and cough which was progressive in nature with decreased oral intake and dysphagia..  Patient had plans for initiation of radiation on 1 6 and had undergone biopsy 1227.  Pathology was notable for metastatic high-grade neuroendocrine tumor most consistent with small cell carcinoma.  In the ED patient was hypoxic with pulse ox of 89% and was put on 2 L of oxygen.  Labs showed WBC elevated at 13.5 with a low albumin at 3.2.  COVID influenza and RSV was negative.  CTA chest was negative for PE but stable right upper lobe mass with mediastinal invasion and deviation of trachea was noted.  Patient was given IV fluids calcium  gluconate DuoNebs and case was discussed with oncology who recommended medical admission to initiate chemotherapy.     During hospitalization, patient has undergone port placement for chemotherapy initiation and has been started on chemotherapy.    Assessment/Plan:    Small cell lung cancer, invasive RUL lung mass Acute respiratory failure with hypoxia: Recent findings of invasive RUL lung mass infiltrating mediastinum with metastatic adenopathy.  Pathology consistent with small cell carcinoma.  She has developed hypoxia with new 2 L [place] requirement on admission.  CT chest negative for PE and otherwise shows stable malignant findings from PET/CT.  Oncology on board.  Status post port placement for chemotherapy which has been initiated during hospitalization.. Radiation oncology have started on urgent radiation treatment on 12/02/2023.  Uric acid of 6.6.  Follow oncology recommendation.   Failure to thrive: Patient with progressive generalized weakness,  fatigue, lack of appetite.  She reports some recent dysphagia.  Continue oral hydration PT OT evaluation and speech therapy.   COPD: Continue maintenance Symbicort , DuoNebs as needed.     Hypocalcemia: Has been receiving calcium  gluconate but asymptomatic.  Receiving 4 g of calcium  gluconate today. -Follow serum calcium  level in a.m.   Hypophosphatemia.   -Phosphorus is 2.0 -Continue replacement of phosphorus -Check phosphorus level in a.m.   Hypertension: Normotensive on admission -Valsartan on hold   Left thyroid  nodule: 1.5 cm left thyroid  nodule seen again on CT imaging.  Lesion was hypermetabolic on PET/CT 11/30/2023.  Follow-up nonemergent thyroid  ultrasound recommended.     Medications     Chlorhexidine  Gluconate Cloth  6 each Topical Daily   dronabinol   5 mg Oral BID AC   enoxaparin  (LOVENOX ) injection  40 mg Subcutaneous Q24H   feeding supplement  237 mL Oral BID BM   mometasone -formoterol   2 puff Inhalation BID   phosphorus  500 mg Oral TID   sodium chloride  flush  3 mL Intravenous Q12H     Data Reviewed:   CBG:  Recent Labs  Lab 11/30/23 1512  GLUCAP 117*    SpO2: 97 % O2 Flow Rate (L/min): (S) 2 L/min (down to 1 lpm- RN aware) FiO2 (%): 28 %    Vitals:   12/06/23 0846 12/06/23 1339 12/06/23 1948 12/07/23 0552  BP:  130/73 (!) 142/79 133/77  Pulse:  74 84 76  Resp:  20 18 18   Temp:  97.7 F (36.5 C) 97.9 F (36.6 C) 97.9 F (36.6 C)  TempSrc:  Oral Oral Oral  SpO2: 97% 97% 97% 97%  Weight:      Height:          Data Reviewed:  Basic Metabolic Panel: Recent Labs  Lab 12/03/23 0545 12/04/23 0500 12/05/23 0500 12/06/23 0620 12/07/23 0541  NA 138 139 138 140 139  K 4.8 4.0 4.1 4.2 4.1  CL 111 112* 112* 113* 112*  CO2 18* 20* 17* 20* 23  GLUCOSE 102* 111* 237* 195* 104*  BUN 40* 35* 35* 31* 23*  CREATININE 1.24* 1.02* 0.90 0.77 0.59  CALCIUM  5.7* 5.8* 5.9* 5.9* 5.8*  MG  --  2.6* 3.1* 2.7*  --   PHOS  --  2.3* 1.6* 2.0* 2.0*     CBC: Recent Labs  Lab 12/03/23 0545 12/04/23 0500 12/05/23 0500 12/06/23 0544 12/07/23 0541  WBC 10.4 7.1 3.0* 6.0 7.2  NEUTROABS  --  5.9 2.6 5.5 6.6  HGB 12.8 11.6* 11.8* 11.7* 12.0  HCT 39.4 37.4 36.6 36.8 38.4  MCV 86.4 86.6 86.3 86.4 87.5  PLT 240 259 287 322 322    LFT Recent Labs  Lab 12/03/23 0545 12/04/23 0500 12/05/23 0500 12/06/23 0620 12/07/23 0541  AST 28 19 64* 40 37  ALT 25 18 34 32 35  ALKPHOS 63 62 83 74 72  BILITOT 0.8 0.6 0.3 0.3 0.5  PROT 7.6 7.1 7.5 6.7 6.8  ALBUMIN 2.4* 2.2* 2.3* 2.1* 2.2*     Antibiotics: Anti-infectives (From admission, onward)    None        DVT prophylaxis: Lovenox   Code Status: Full code  Family Communication: No family at bedside   CONSULTS    Subjective   Chemotherapy completed.  Denies shortness of breath.  Getting IV calcium  for hypocalcemia.   Objective    Physical Examination:  General-appears in no acute distress Heart-S1-S2, regular, no murmur auscultated Lungs-clear to auscultation bilaterally, no wheezing or crackles auscultated Abdomen-soft, nontender, no organomegaly Extremities-no edema in the lower extremities Neuro-alert, oriented x3, no focal deficit noted   Status is: Inpatient:             [name]   [facility] If 7PM-7AM, please contact night-coverage at [email or web address], Office  [phone]   12/07/2023, 9:34 AM  LOS: 5 days

## 2023-12-07 NOTE — Plan of Care (Signed)

## 2023-12-07 NOTE — Progress Notes (Signed)
 Tammy Cordova completed her chemotherapy without any problems.  She has had no nausea or vomiting.  She is getting radiation therapy.  She still has marked hypocalcemia.  I will give her a dose of calcium  and 4 g today.  I have to believe that the low calcium  must be connected to her underlying malignancy.  Her labs show sodium 139.  Potassium 4.1.  BUN 23 creatinine 0.59.  Blood sugar 104.  Her albumin is 2.2.  Bilirubin 0.5.  SGPT 35 SGOT 37.  Her white cell count is 7.2.  Hemoglobin 12.  Platelet count 322,000.  I will try her on some [name]  (5 mg p.o. twice daily) to try to help with any nausea that she may have.  She has had no bleeding.  There is been no headache.  Her appetite is doing okay.  She has had no problems with bowels or bladder.  Her temperature 97.9.  Pulse 76.  Blood pressure 133/77.  Her oral exam does not show any mucositis.  Her lungs are clear bilaterally.  She has decent air movement bilaterally.  Cardiac exam regular rate and rhythm.  There are no murmurs.  Abdomen is soft.  Bowel sounds are present.  There is no fluid wave.  There is no palpable liver or spleen tip.  Extremity shows no clubbing, cyanosis or edema.  Neurological exam is nonfocal.  Again, she completed her first cycle of chemotherapy.  She continues on radiotherapy.  She will get calcium  today.  I will add to hope that she should be able to go home soon.  I do appreciate the great care that she is getting from everybody up on 6E.   Jeralyn Crease, MD  [place] 33:3

## 2023-12-07 NOTE — Progress Notes (Signed)
 Patient continues to be admitted. She began radiation on 12/02/2023 and chemo on 12/04/2023.   Will continue to follow for post discharge needs and office follow-up.   Oncology Nurse Navigator Documentation     12/07/2023    9:30 AM  Oncology Nurse Navigator Flowsheets  Phase of Treatment Chemo  Chemotherapy Actual Start Date: 12/04/2023  Chemotherapy Expected End Date: 04/29/2024  Navigator Follow Up Date: 12/13/2023  Navigator Follow Up Reason: Appointment Review  Navigator Location [facility]  Navigator Encounter Type Appt/Treatment Plan Review  Treatment Initiated Date 12/02/2023  Patient Visit Type MedOnc  Treatment Phase Active Tx  Barriers/Navigation Needs Coordination of Care  Interventions None Required  Acuity Level 2-Minimal Needs (1-2 Barriers Identified)  Time Spent with Patient 15

## 2023-12-08 ENCOUNTER — Ambulatory Visit
Admit: 2023-12-08 | Discharge: 2023-12-08 | Disposition: A | Discharge status: Home / Self Care | Payer: 59 | Attending: Radiation Oncology | Admitting: Radiation Oncology

## 2023-12-08 ENCOUNTER — Ambulatory Visit: Payer: 59

## 2023-12-08 ENCOUNTER — Other Ambulatory Visit: Payer: Self-pay

## 2023-12-08 DIAGNOSIS — C3411 Malignant neoplasm of upper lobe, right bronchus or lung: Secondary | ICD-10-CM | POA: Diagnosis not present

## 2023-12-08 DIAGNOSIS — R531 Weakness: Secondary | ICD-10-CM | POA: Diagnosis not present

## 2023-12-08 DIAGNOSIS — J9601 Acute respiratory failure with hypoxia: Secondary | ICD-10-CM | POA: Diagnosis not present

## 2023-12-08 LAB — CBC WITH DIFFERENTIAL/PLATELET
Abs Immature Granulocytes: 0.08 10*3/uL — ABNORMAL HIGH (ref 0.00–0.07)
Basophils Absolute: 0 10*3/uL (ref 0.0–0.1)
Basophils Relative: 0 %
Eosinophils Absolute: 0 10*3/uL (ref 0.0–0.5)
Eosinophils Relative: 0 %
HCT: 35 % — ABNORMAL LOW (ref 36.0–46.0)
Hemoglobin: 11.1 g/dL — ABNORMAL LOW (ref 12.0–15.0)
Immature Granulocytes: 1 %
Lymphocytes Relative: 6 %
Lymphs Abs: 0.4 10*3/uL — ABNORMAL LOW (ref 0.7–4.0)
MCH: 27.4 pg (ref 26.0–34.0)
MCHC: 31.7 g/dL (ref 30.0–36.0)
MCV: 86.4 fL (ref 80.0–100.0)
Monocytes Absolute: 0 10*3/uL — ABNORMAL LOW (ref 0.1–1.0)
Monocytes Relative: 0 %
Neutro Abs: 5.7 10*3/uL (ref 1.7–7.7)
Neutrophils Relative %: 93 %
Platelets: 242 10*3/uL (ref 150–400)
RBC: 4.05 MIL/uL (ref 3.87–5.11)
RDW: 15.9 % — ABNORMAL HIGH (ref 11.5–15.5)
WBC: 6.2 10*3/uL (ref 4.0–10.5)
nRBC: 0 % (ref 0.0–0.2)

## 2023-12-08 LAB — COMPREHENSIVE METABOLIC PANEL
ALT: 78 U/L — ABNORMAL HIGH (ref 0–44)
AST: 92 U/L — ABNORMAL HIGH (ref 15–41)
Albumin: 2.2 g/dL — ABNORMAL LOW (ref 3.5–5.0)
Alkaline Phosphatase: 82 U/L (ref 38–126)
Anion gap: 6 (ref 5–15)
BUN: 20 mg/dL (ref 6–20)
CO2: 25 mmol/L (ref 22–32)
Calcium: 5.9 mg/dL — CL (ref 8.9–10.3)
Chloride: 110 mmol/L (ref 98–111)
Creatinine, Ser: 0.62 mg/dL (ref 0.44–1.00)
GFR, Estimated: >60 mL/min (ref >60)
Glucose, Bld: 109 mg/dL — ABNORMAL HIGH (ref 70–99)
Potassium: 3.7 mmol/L (ref 3.5–5.1)
Sodium: 141 mmol/L (ref 135–145)
Total Bilirubin: 0.6 mg/dL (ref 0.0–1.2)
Total Protein: 6.4 g/dL — ABNORMAL LOW (ref 6.5–8.1)

## 2023-12-08 LAB — RAD ONC ARIA SESSION SUMMARY
Course Elapsed Days: 6
Plan Fractions Treated to Date: 5
Plan Prescribed Dose Per Fraction: 2.5 Gy
Plan Total Fractions Prescribed: 15
Plan Total Prescribed Dose: 37.5 Gy
Reference Point Dosage Given to Date: 12.5 Gy
Reference Point Session Dosage Given: 2.5 Gy
Session Number: 5

## 2023-12-08 LAB — PHOSPHORUS: Phosphorus: 3.2 mg/dL (ref 2.5–4.6)

## 2023-12-08 MED ORDER — CALCIUM GLUCONATE-NACL 2-0.675 GM/100ML-% IV SOLN
2.0000 g | Freq: Once | INTRAVENOUS | Status: AC
  Administered 2023-12-08: 2000 mg via INTRAVENOUS
  Filled 2023-12-08: qty 100

## 2023-12-08 MED ORDER — DRONABINOL 2.5 MG PO CAPS
2.5000 mg | ORAL_CAPSULE | Freq: Two times a day (BID) | ORAL | Status: DC
Start: 2023-12-08 — End: 2023-12-09

## 2023-12-08 MED ORDER — SODIUM CHLORIDE 0.9 % IV SOLN
4.0000 g | Freq: Once | INTRAVENOUS | Status: DC
Start: 2023-12-08 — End: 2023-12-08

## 2023-12-08 MED ORDER — SALINE SPRAY 0.65 % NA SOLN
1.0000 | NASAL | Status: DC | PRN
  Filled 2023-12-08: qty 44

## 2023-12-08 NOTE — Progress Notes (Signed)
 [facility]  PROGRESS NOTE  Tammy Cordova FMW:[id number] DOB: 10/08/1966 DOA: 12/02/2023 PCP: Tammy Velna SAUNDERS, MD   Brief HPI:   58 y.o. female with medical history significant for COPD, HTN, RA, and recently diagnosed invasive RUL lung mass (pathology consistent with small cell carcinoma) presented to the hospital with fatigue and cough which was progressive in nature with decreased oral intake and dysphagia..  Patient had plans for initiation of radiation on 1 6 and had undergone biopsy 1227.  Pathology was notable for metastatic high-grade neuroendocrine tumor most consistent with small cell carcinoma.  In the ED patient was hypoxic with pulse ox of 89% and was put on 2 L of oxygen.  Labs showed WBC elevated at 13.5 with a low albumin at 3.2.  COVID influenza and RSV was negative.  CTA chest was negative for PE but stable right upper lobe mass with mediastinal invasion and deviation of trachea was noted.  Patient was given IV fluids calcium  gluconate DuoNebs and case was discussed with oncology who recommended medical admission to initiate chemotherapy.     During hospitalization, patient has undergone port placement for chemotherapy initiation and has been started on chemotherapy.    Assessment/Plan:    Small cell lung cancer, invasive RUL lung mass Acute respiratory failure with hypoxia: Recent findings of invasive RUL lung mass infiltrating mediastinum with metastatic adenopathy.  Pathology consistent with small cell carcinoma.  She has developed hypoxia with new 2 L [place] requirement on admission.  CT chest negative for PE and otherwise shows stable malignant findings from PET/CT.  Oncology on board.  Status post port placement for chemotherapy which has been completed during hospitalization.. Radiation oncology have started on urgent radiation treatment on 12/02/2023.  Uric acid of 6.6.  Likely discharge home in a.m.  Failure to thrive: Patient with progressive generalized weakness,  fatigue, lack of appetite.  She reports some recent dysphagia.  Continue oral hydration PT OT evaluation and speech therapy.  Plan to go home with home health PT.   COPD: Continue maintenance Symbicort , DuoNebs as needed.     Hypocalcemia: Has been receiving calcium  gluconate but asymptomatic.  Received 4 g of calcium  gluconate yesterday. -Corrected calcium  7.34  for hypoalbuminemia -Will give 4g of calcium  gluconate -Follow serum calcium  level in a.m.   Hypophosphatemia.   -Replete   Hypertension: Normotensive on admission -Valsartan on hold   Left thyroid  nodule: 1.5 cm left thyroid  nodule seen again on CT imaging.  Lesion was hypermetabolic on PET/CT 11/30/2023.  Follow-up nonemergent thyroid  ultrasound recommended.     Medications     Chlorhexidine  Gluconate Cloth  6 each Topical Daily   dronabinol   2.5 mg Oral BID AC   enoxaparin  (LOVENOX ) injection  40 mg Subcutaneous Q24H   feeding supplement  237 mL Oral BID BM   mometasone -formoterol   2 puff Inhalation BID   sodium chloride  flush  3 mL Intravenous Q12H     Data Reviewed:   CBG:  No results for input(s): GLUCAP in the last 168 hours.   SpO2: 96 % O2 Flow Rate (L/min): (S) 2 L/min (down to 1 lpm- RN aware) FiO2 (%): 28 %    Vitals:   12/07/23 2207 12/08/23 0536 12/08/23 0848 12/08/23 1334  BP: 121/73 120/76  (!) 143/79  Pulse: 94 88  91  Resp: 16 16  20   Temp: 98.5 F (36.9 C) 98.3 F (36.8 C)  98.2 F (36.8 C)  TempSrc: Oral Oral  Oral  SpO2: 96% 97%  94% 96%  Weight:      Height:          Data Reviewed:  Basic Metabolic Panel: Recent Labs  Lab 12/04/23 0500 12/05/23 0500 12/06/23 0620 12/07/23 0541 12/08/23 0529  NA 139 138 140 139 141  K 4.0 4.1 4.2 4.1 3.7  CL 112* 112* 113* 112* 110  CO2 20* 17* 20* 23 25  GLUCOSE 111* 237* 195* 104* 109*  BUN 35* 35* 31* 23* 20  CREATININE 1.02* 0.90 0.77 0.59 0.62  CALCIUM  5.8* 5.9* 5.9* 5.8* 5.9*  MG 2.6* 3.1* 2.7*  --   --   PHOS 2.3*  1.6* 2.0* 2.0* 3.2    CBC: Recent Labs  Lab 12/04/23 0500 12/05/23 0500 12/06/23 0544 12/07/23 0541 12/08/23 0529  WBC 7.1 3.0* 6.0 7.2 6.2  NEUTROABS 5.9 2.6 5.5 6.6 5.7  HGB 11.6* 11.8* 11.7* 12.0 11.1*  HCT 37.4 36.6 36.8 38.4 35.0*  MCV 86.6 86.3 86.4 87.5 86.4  PLT 259 287 322 322 242    LFT Recent Labs  Lab 12/04/23 0500 12/05/23 0500 12/06/23 0620 12/07/23 0541 12/08/23 0529  AST 19 64* 40 37 92*  ALT 18 34 32 35 78*  ALKPHOS 62 83 74 72 82  BILITOT 0.6 0.3 0.3 0.5 0.6  PROT 7.1 7.5 6.7 6.8 6.4*  ALBUMIN 2.2* 2.3* 2.1* 2.2* 2.2*     Antibiotics: Anti-infectives (From admission, onward)    None        DVT prophylaxis: Lovenox   Code Status: Full code  Family Communication: No family at bedside   CONSULTS    Subjective   Chemotherapy completed.  Plan for radiation today.   Objective    Physical Examination:  General-appears in no acute distress Heart-S1-S2, regular, no murmur auscultated Lungs-clear to auscultation bilaterally, no wheezing or crackles auscultated Abdomen-soft, nontender, no organomegaly Extremities-no edema in the lower extremities Neuro-alert, oriented x3, no focal deficit noted   Status is: Inpatient:             Tammy Cordova   [facility] If 7PM-7AM, please contact night-coverage at [email or web address], Office  [phone]   12/08/2023, 3:25 PM  LOS: 6 days

## 2023-12-08 NOTE — Plan of Care (Signed)
  Problem: Education: Goal: Knowledge of General Education information will improve Description: Including pain rating scale, medication(s)/side effects and non-pharmacologic comfort measures Outcome: Progressing   Problem: Health Behavior/Discharge Planning: Goal: Ability to manage health-related needs will improve Outcome: Progressing   Problem: Clinical Measurements: Goal: Ability to maintain clinical measurements within normal limits will improve Outcome: Progressing Goal: Will remain free from infection Outcome: Progressing Goal: Diagnostic test results will improve Outcome: Progressing Goal: Respiratory complications will improve Outcome: Progressing Goal: Cardiovascular complication will be avoided Outcome: Progressing   Problem: Activity: Goal: Risk for activity intolerance will decrease Outcome: Progressing   Problem: Nutrition: Goal: Adequate nutrition will be maintained Outcome: Progressing   Problem: Coping: Goal: Level of anxiety will decrease Outcome: Progressing   Problem: Elimination: Goal: Will not experience complications related to bowel motility Outcome: Progressing Goal: Will not experience complications related to urinary retention Outcome: Progressing   Problem: Pain Management: Goal: General experience of comfort will improve Outcome: Progressing   Problem: Safety: Goal: Ability to remain free from injury will improve Outcome: Progressing   Problem: Skin Integrity: Goal: Risk for impaired skin integrity will decrease Outcome: Progressing   Problem: Education: Goal: Knowledge of the prescribed therapy will improve Outcome: Progressing   Problem: Bowel/Gastric: Goal: Occurrences of nausea, vomiting, and/or diarrhea will decrease Outcome: Progressing   Problem: Coping: Goal: Level of anxiety will decrease Outcome: Progressing Goal: Ability to identify and develop effective coping behavior will improve Outcome: Progressing    Problem: Nutritional: Goal: Will achieve and/or maintain adequate nutritional intake Outcome: Progressing   Problem: Skin Integrity: Goal: Risk for impaired skin integrity will decrease Outcome: Progressing   Problem: Urinary Elimination: Goal: Complications of radiation-induced cystitis will be minimized/avoided Outcome: Progressing   Problem: Fatigue: Goal: Expression of feelings of increased energy will improve Outcome: Progressing

## 2023-12-08 NOTE — Progress Notes (Signed)
 PT Note  Patient Details Name: Tammy Cordova MRN: [id number] DOB: 30-Aug-1966   Cancelled Treatment:    Reason Eval/Treat Not Completed: Fatigue/lethargy limiting ability to participate. Pt reports just having returned from radiation and too fatigued for therapy at this time. Visitor present. PT to return later in the day as schedule allows. PT to continue to follow acutely.   [place], PT Acute Rehab   [name] 12/08/2023, 1:19 PM

## 2023-12-08 NOTE — Progress Notes (Signed)
 She had little bit of a tough time with the [name] .  She is off the [name]  was little bit too much for her.  I think it would be wise to try to decrease the [name]  dose to 2.5 mg.  I am still somewhat surprised that her calcium  is still so low.  Calcium  was only 5.9.  Albumin is 2.2.  Her LFTs are little bit elevated which I suspect are probably from the chemotherapy.  She is getting a ton of IV calcium  and.  Provide will know if we need to have nephrology see her to see if they can help with this hypocalcemia.  She is eating.  She is having no nausea or vomiting.  She is urinating okay.  She is having no diarrhea..  She is having no cough.  There is no chest pain.  She is having no increased shortness of breath.  Her CBC shows white cell count 6.2.  Hemoglobin 11.1.  Platelet count 242,000.  She has had no fever.  She is still getting radiotherapy.  Currently, I would have said that her performance status is probably ECOG 1.  Her vital signs are temperature 98.2.  Pulse 91.  Blood pressure 143/79.  Her lungs sound relatively clear bilaterally.  I do not hear any wheezing.  Cardiac exam regular rate and rhythm.  Abdomen is soft.  Bowel sounds are present.  There is no fluid wave.  There is no palpable liver or spleen tip.  Extremities shows no clubbing, cyanosis or edema.  She has extensive stage small cell lung cancer.  She has had her first cycle of chemotherapy.  She is given concurrent radiotherapy.  Again, the hypocalcemia is a real problem.  She keeps getting calcium  and.  Calcium  levels do not go up.  We will have to see how things look tomorrow with her labs.  It would be nice to try to get her home we will soon.  I do appreciate all the great care she is given from everybody up on 6 E.  Jeralyn Crease, MD  [place] 5:34

## 2023-12-09 ENCOUNTER — Ambulatory Visit
Admission: RE | Admit: 2023-12-09 | Discharge: 2023-12-09 | Disposition: A | Discharge status: Home / Self Care | Payer: 59 | Source: Ambulatory Visit | Attending: Radiation Oncology | Admitting: Radiation Oncology

## 2023-12-09 ENCOUNTER — Other Ambulatory Visit: Payer: Self-pay

## 2023-12-09 DIAGNOSIS — C3411 Malignant neoplasm of upper lobe, right bronchus or lung: Secondary | ICD-10-CM | POA: Diagnosis not present

## 2023-12-09 LAB — RAD ONC ARIA SESSION SUMMARY
Course Elapsed Days: 7
Plan Fractions Treated to Date: 6
Plan Prescribed Dose Per Fraction: 2.5 Gy
Plan Total Fractions Prescribed: 15
Plan Total Prescribed Dose: 37.5 Gy
Reference Point Dosage Given to Date: 15 Gy
Reference Point Session Dosage Given: 2.5 Gy
Session Number: 6

## 2023-12-09 LAB — COMPREHENSIVE METABOLIC PANEL
ALT: 51 U/L — ABNORMAL HIGH (ref 0–44)
AST: 32 U/L (ref 15–41)
Albumin: 2.2 g/dL — ABNORMAL LOW (ref 3.5–5.0)
Alkaline Phosphatase: 77 U/L (ref 38–126)
Anion gap: 9 (ref 5–15)
BUN: 13 mg/dL (ref 6–20)
CO2: 23 mmol/L (ref 22–32)
Calcium: 6 mg/dL — CL (ref 8.9–10.3)
Chloride: 108 mmol/L (ref 98–111)
Creatinine, Ser: 0.61 mg/dL (ref 0.44–1.00)
GFR, Estimated: >60 mL/min (ref >60)
Glucose, Bld: 128 mg/dL — ABNORMAL HIGH (ref 70–99)
Potassium: 3.3 mmol/L — ABNORMAL LOW (ref 3.5–5.1)
Sodium: 140 mmol/L (ref 135–145)
Total Bilirubin: 0.4 mg/dL (ref 0.0–1.2)
Total Protein: 6.1 g/dL — ABNORMAL LOW (ref 6.5–8.1)

## 2023-12-09 LAB — CBC WITH DIFFERENTIAL/PLATELET
Abs Immature Granulocytes: 0.13 10*3/uL — ABNORMAL HIGH (ref 0.00–0.07)
Basophils Absolute: 0 10*3/uL (ref 0.0–0.1)
Basophils Relative: 0 %
Eosinophils Absolute: 0.1 10*3/uL (ref 0.0–0.5)
Eosinophils Relative: 1 %
HCT: 30.6 % — ABNORMAL LOW (ref 36.0–46.0)
Hemoglobin: 9.9 g/dL — ABNORMAL LOW (ref 12.0–15.0)
Immature Granulocytes: 2 %
Lymphocytes Relative: 8 %
Lymphs Abs: 0.5 10*3/uL — ABNORMAL LOW (ref 0.7–4.0)
MCH: 27.4 pg (ref 26.0–34.0)
MCHC: 32.4 g/dL (ref 30.0–36.0)
MCV: 84.8 fL (ref 80.0–100.0)
Monocytes Absolute: 0 10*3/uL — ABNORMAL LOW (ref 0.1–1.0)
Monocytes Relative: 0 %
Neutro Abs: 4.9 10*3/uL (ref 1.7–7.7)
Neutrophils Relative %: 89 %
Platelets: 216 10*3/uL (ref 150–400)
RBC: 3.61 MIL/uL — ABNORMAL LOW (ref 3.87–5.11)
RDW: 15.8 % — ABNORMAL HIGH (ref 11.5–15.5)
WBC: 5.5 10*3/uL (ref 4.0–10.5)
nRBC: 0 % (ref 0.0–0.2)

## 2023-12-09 MED ORDER — CALCIUM GLUCONATE-NACL 2-0.675 GM/100ML-% IV SOLN
2.0000 g | Freq: Once | INTRAVENOUS | Status: AC
  Administered 2023-12-09: 2000 mg via INTRAVENOUS
  Filled 2023-12-09: qty 100

## 2023-12-09 MED ORDER — POTASSIUM CHLORIDE CRYS ER 20 MEQ PO TBCR
40.0000 meq | EXTENDED_RELEASE_TABLET | Freq: Once | ORAL | Status: AC
Start: 2023-12-09 — End: 2023-12-09
  Administered 2023-12-09: 40 meq via ORAL
  Filled 2023-12-09: qty 2

## 2023-12-09 MED ORDER — OYSTER SHELL CALCIUM/D3 500-5 MG-MCG PO TABS: 1.0000 | ORAL_TABLET | Freq: Two times a day (BID) | ORAL | 0 refills | Status: DC

## 2023-12-09 MED ORDER — HYDROCODONE-ACETAMINOPHEN 5-325 MG PO TABS: 1.0000 | ORAL_TABLET | Freq: Four times a day (QID) | ORAL | 0 refills | Status: DC | PRN

## 2023-12-09 MED ORDER — HEPARIN SOD (PORK) LOCK FLUSH 100 UNIT/ML IV SOLN
500.0000 [IU] | INTRAVENOUS | Status: DC | PRN
  Filled 2023-12-09: qty 5

## 2023-12-09 MED ORDER — DRONABINOL 2.5 MG PO CAPS: 2.5000 mg | ORAL_CAPSULE | Freq: Two times a day (BID) | ORAL | 0 refills | Status: DC

## 2023-12-09 MED ORDER — CALCIUM GLUCONATE-NACL 2-0.675 GM/100ML-% IV SOLN
2.0000 g | Freq: Once | INTRAVENOUS | Status: AC
Start: 2023-12-09 — End: 2023-12-09
  Administered 2023-12-09: 2000 mg via INTRAVENOUS
  Filled 2023-12-09: qty 100

## 2023-12-09 NOTE — Progress Notes (Signed)
 OT Cancellation Note  Patient Details Name: BRIDNEY GUADARRAMA MRN: [id number] DOB: 1966-06-03   Cancelled Treatment:    Reason Eval/Treat Not Completed: Patient declined, prefers to focus on returning home after radiation therapy later today.  Kennth Mliss Helling 12/09/2023, 12:14 PM Mliss HERO, OTR/L Acute Rehabilitation Services Office: [phone]

## 2023-12-09 NOTE — Progress Notes (Signed)
 Tammy Cordova  feels well.  She is doing better on the lower dose of the [name] .  The radiation is going okay.  So far, she has had no problems with the chemotherapy.  She is going to the bathroom.  There is no incontinence.  She is not complaining of any pain.  There is no cough or shortness of breath.  Her calcium  is still on the low side but slowly coming up.  She needs another dose of IV calcium  today.  Her serum is 140.  Potassium 3.3.  BUN 13 creatinine 0.61.  Calcium  is 6 with an albumin of 2.2.  Her white cell count is 5.5.  Hemoglobin 9.9.  Platelet count 216,000.  She is eating okay.  There is no nausea or vomiting.  Patient does have a little bit of a nosebleed from the right nares.  I think this might be from the dry air.  Her vital signs show temperature 98.2.  Pulse 90.  Blood pressure 140/76.  Her head and neck exam shows no ocular or oral lesions.  She has some Kleenex in the right nostril.  Her lungs are clear bilaterally.  She has good air movement bilaterally.  Cardiac exam regular rate and rhythm.  She has no murmurs.  Abdomen is soft.  Bowel sounds are present.  There is no guarding or rebound tenderness.  The right supraclavicular lymph node appears to be a little bit smaller.  Neurological exam is nonfocal.   Tammy Cordova has small cell lung cancer.  She has extensive bulky disease in the mediastinum.  She got her first cycle of chemotherapy along with radiation therapy.  She is getting radiotherapy.  I do think she can probably go home today.  Again her calcium  is on the low side.  We are doing her best to replace it.  I probably want to see her back in the office in a week or so.  Her next treatment is probably can be due in a little over 2 weeks.   Jeralyn Crease, MD  [place] 41:10

## 2023-12-09 NOTE — Progress Notes (Signed)
 Date and time results received: 12/09/23 0622   Test: Ca Critical Value: 6.0  Name of Provider Notified: Anthoney Harada NP  Orders Received? Or Actions Taken?:

## 2023-12-09 NOTE — Progress Notes (Signed)
 PT Cancellation Note  Patient Details Name: Tammy Cordova MRN: [id number] DOB: 03-24-1966   Cancelled Treatment:    Reason Eval/Treat Not Completed: Patient declined, no reason specified pt politely declines PT today, she has been moving well with no concerns and just wants to focus on getting home after radiation later today.   Josette Rough, PT, DPT 12/09/23 11:16 AM

## 2023-12-09 NOTE — Discharge Summary (Signed)
 Physician Discharge Summary  Tammy Cordova FMW:[id number] DOB: [birth year] DOA: 12/02/2023  PCP: Vernon Velna SAUNDERS, MD  Admit date: 12/02/2023 Discharge date: 12/09/2023  Admitted From: Home Disposition: Home  Recommendations for Outpatient Follow-up:  Follow up with PCP in 1 week with repeat CBC/BMP Outpatient follow-up with oncology/radiation oncology Follow up in ED if symptoms worsen or new appear   Home Health: Home health PT Equipment/Devices: None  Discharge Condition: Guarded  CODE STATUS: Full Diet recommendation: Heart healthy  Brief/Interim Summary: 59 y.o. female with medical history significant for COPD, HTN, RA, and recently diagnosed invasive RUL lung mass (pathology consistent with small cell carcinoma) presented with worsening fatigue and cough.  On presentation, she was hypoxic requiring supplemental oxygen.  CTA chest was negative for PE but showed stable right upper lobe mass.  Oncology was consulted.  During the hospitalization, she was started on chemotherapy and radiation.  Subsequently, her condition has improved.  Oncology has cleared the patient for discharge.  She will be discharged home today after radiation treatment.  Outpatient follow-up with PCP and oncology/radiation oncology  Discharge Diagnoses:   Diagnosis of small cell lung cancer with invasive right upper lobe lung mass Acute respiratory failure with hypoxia -Her respiratory status has improved.  Currently on room air. -CTA chest was negative for PE but showed stable right upper lobe mass.  Oncology was consulted.  During the hospitalization, she was started on chemotherapy and radiation.  Subsequently, her condition has improved.  Oncology has cleared the patient for discharge.  She will be discharged home today after radiation treatment.  Outpatient follow-up with PCP and oncology/radiation oncology  Failure to thrive Poor oral intake Physical deconditioning -[name]  started by oncology: Received  continued on discharge with outpatient follow-up with PCP/oncology -Will need home health PT  Hypocalcemia -Received IV calcium  gluconate treatment.  Calcium  on the lower side but improving.  Will need oral supplementation on discharge.  Outpatient follow-up with PCP/oncology  Hypertension -Blood pressure intermittently on the higher side.  Resume statin.  Outpatient follow-up with PCP  Left thyroid  nodule -1.5 cm left thyroid  nodule seen on CT scan.  Patient was hypermetabolic on PET scan.  Outpatient follow-up with nonemergent thyroid  ultrasound recommended  Hypokalemia -Replace prior to discharge.  Outpatient follow-up  Anemia chronic disease -Possibly from new diagnosis of cancer per hemoglobin currently stable.  Outpatient follow-up  Leukopenia -Resolved  Mildly elevated LFTs -Improving.  Outpatient follow-up  Discharge Instructions  Discharge Instructions     Infusion Appointment Request   Complete by: Dec 06, 2023    Contact your oncology clinic or infusion center to schedule this appointment.   IS Injection Appt Request (15 minutes)   Complete by: Dec 08, 2023    Contact your oncology clinic or infusion center to schedule this appointment.   Diet - low sodium heart healthy   Complete by: As directed    Increase activity slowly   Complete by: As directed    No wound care   Complete by: As directed    TREATMENT CONDITIONS   Complete by: As directed    Patient should have CBC & CMP within 7 days prior to chemotherapy administration. NOTIFY MD IF: ANC < 1500, Hemoglobin < 8, PLT < 100,000,  Total Bili > 1.5, Creatinine > 1.5, ALT or  AST > 80 or if patient has unstable vital signs: Temperature >= 100.33F, SBP > 180 or < 90, RR > 30 or HR > 100.      Allergies as  of 12/09/2023   No Known Allergies      Medication List     STOP taking these medications    meloxicam 15 MG tablet Commonly known as: MOBIC   oxyCODONE  5 MG immediate release tablet Commonly known  as: Oxy IR/ROXICODONE        TAKE these medications    budesonide -formoterol  160-4.5 MCG/ACT inhaler Commonly known as: Symbicort  Take 2 puffs first thing in am and then another 2 puffs about 12 hours later.   calcium -vitamin D  500-5 MG-MCG tablet Commonly known as: OSCAL WITH D Take 1 tablet by mouth 2 (two) times daily.   dronabinol  2.5 MG capsule Commonly known as: [name]  Take 1 capsule (2.5 mg total) by mouth 2 (two) times daily before lunch and supper.   HYDROcodone -acetaminophen  5-325 MG tablet Commonly known as: NORCO/VICODIN Take 1 tablet by mouth every 6 (six) hours as needed for moderate pain (pain score 4-6).   pantoprazole  40 MG tablet Commonly known as: PROTONIX  Take 40 mg by mouth every morning.   valsartan 320 MG tablet Commonly known as: DIOVAN Take 320 mg by mouth daily.        Follow-up Information     Pahwani, Velna SAUNDERS, MD. Schedule an appointment as soon as possible for a visit in 1 week(s).   Specialty: Internal Medicine Contact information: [address] [place] [address] [phone]         Timmy Maude SAUNDERS, MD. Schedule an appointment as soon as possible for a visit in 1 week(s).   Specialty: Oncology Contact information: [address] [place] [address] [phone]                No Known Allergies  Consultations: Oncology   Procedures/Studies: IR IMAGING GUIDED PORT INSERTION Result Date: 12/03/2023 CLINICAL DATA:  Worsening symptomatic small cell carcinoma of the right lung with need for emergent chemotherapy. EXAM: IMPLANTED PORT A CATH PLACEMENT WITH ULTRASOUND AND FLUOROSCOPIC GUIDANCE ANESTHESIA/SEDATION: Moderate (conscious) sedation was employed during this procedure. A total of Versed  1.0 mg and Fentanyl  100 mcg was administered intravenously. Moderate Sedation Time: 36 minutes. The patient's level of consciousness and vital signs were monitored continuously by radiology  nursing throughout the procedure under my direct supervision. FLUOROSCOPY: 30 seconds.  1.0 mGy. PROCEDURE: The procedure, risks, benefits, and alternatives were explained to the patient. Questions regarding the procedure were encouraged and answered. The patient understands and consents to the procedure. A time-out was performed prior to initiating the procedure. Ultrasound was utilized to confirm patency of the left internal jugular vein. Under ultrasound image was saved and recorded. The left neck and chest were prepped with chlorhexidine  in a sterile fashion, and a sterile drape was applied covering the operative field. Maximum barrier sterile technique with sterile gowns and gloves were used for the procedure. Local anesthesia was provided with 1% lidocaine . After creating a small venotomy incision, a 21 gauge needle was advanced into the left internal jugular vein under direct, real-time ultrasound guidance. Ultrasound image documentation was performed. After securing guidewire access, an 8 Fr dilator was placed. A J-wire was kinked to measure appropriate catheter length. A subcutaneous port pocket was then created along the upper chest wall utilizing sharp and blunt dissection. Portable cautery was utilized. The pocket was irrigated with sterile saline. A single lumen power injectable port was chosen for placement. The 8 Fr catheter was tunneled from the port pocket site to the venotomy incision. The port was placed in  the pocket. External catheter was trimmed to appropriate length based on guidewire measurement. At the venotomy, an 8 Fr peel-away sheath was placed over a guidewire. The catheter was then placed through the sheath and the sheath removed. Final catheter positioning was confirmed and documented with a fluoroscopic spot image. The port was accessed with a needle and aspirated and flushed with heparinized saline. The access needle was left in place. The venotomy and port pocket incisions were  closed with subcutaneous 3-0 Monocryl and subcuticular 4-0 Vicryl. Dermabond was applied to both incisions. COMPLICATIONS: COMPLICATIONS None FINDINGS: After catheter placement, the tip lies at the cavo-atrial junction. The catheter aspirates normally and is ready for immediate use. IMPRESSION: Placement of single lumen port a cath via left internal jugular vein. The catheter tip lies at the cavo-atrial junction. A power injectable port a cath was placed and is ready for immediate use. Electronically Signed   By: Marcey Moan M.D.   On: 12/03/2023 15:23   CT Angio Chest PE W and/or Wo Contrast Result Date: 12/02/2023 CLINICAL DATA:  Non-small cell lung cancer. Worsening shortness of breath and weakness. * Tracking Code: BO * EXAM: CT ANGIOGRAPHY CHEST WITH CONTRAST TECHNIQUE: Multidetector CT imaging of the chest was performed using the standard protocol during bolus administration of intravenous contrast. Multiplanar CT image reconstructions and MIPs were obtained to evaluate the vascular anatomy. RADIATION DOSE REDUCTION: This exam was performed according to the departmental dose-optimization program which includes automated exposure control, adjustment of the mA and/or kV according to patient size and/or use of iterative reconstruction technique. CONTRAST:  75mL OMNIPAQUE  IOHEXOL  350 MG/ML SOLN COMPARISON:  PET-CT 11/30/2023 and CT chest 11/25/2023. FINDINGS: Cardiovascular: No filling defect is identified in the pulmonary arterial tree to suggest pulmonary embolus. Continued prominence of the main pulmonary artery compatible pulmonary arterial hypertension. Mediastinum/Nodes: Stable upper right mediastinal invasion by the right upper lobe mass. Stable 2.4 cm right supraclavicular node/mass, partially extending into level V of the lower neck. Stable bilateral axillary lymph nodes. 1.5 cm left thyroid  nodule, previously hypermetabolic. Recommend thyroid  US  and biopsy (ref: J Am Coll Radiol. 2015 Feb;12(2):  143-50). Lungs/Pleura: Stable right upper lobe mass partially surrounding the right subclavian artery and invading the mediastinum, about 8 cm in long axis on image 33 series 4, and mildly flattening the right posterior tracheal margin. Leftward deviation of the trachea as before. Severe emphysema. Right suprahilar nodularity tangential to the mass. Stable pleural nodularity along the lateral margin of the left major fissure on image 94 series 12, previously hypermetabolic, compatible with malignancy. Upper Abdomen: Unremarkable Musculoskeletal: Mild thoracic spondylosis. Review of the MIP images confirms the above findings. IMPRESSION: 1. No filling defect is identified in the pulmonary arterial tree to suggest pulmonary embolus. 2. Stable right upper lobe mass with mediastinal invasion and leftward deviation of the trachea. 3. Stable 2.4 cm right supraclavicular node/mass. 4. Stable pleural nodularity along the lateral margin of the left major fissure, previously hypermetabolic, compatible with malignancy. 5. Severe emphysema. 6. Continued prominence of the main pulmonary artery compatible with pulmonary arterial hypertension. 7. 1.5 cm left thyroid  nodule, previously hypermetabolic. Recommend thyroid  US  and biopsy. Emphysema (ICD10-J43.9). Electronically Signed   By: Ryan Salvage M.D.   On: 12/02/2023 18:24   DG Chest 2 View Result Date: 12/02/2023 CLINICAL DATA:  Shortness of breath. EXAM: CHEST - 2 VIEW COMPARISON:  December 20, 2021. FINDINGS: The heart size and mediastinal contours are within normal limits. Minimal bibasilar subsegmental atelectasis is noted. The visualized  skeletal structures are unremarkable. IMPRESSION: Minimal bibasilar subsegmental atelectasis. Electronically Signed   By: Lynwood Landy Raddle M.D.   On: 12/02/2023 10:19   NM PET Image Initial (PI) Skull Base To Thigh Result Date: 12/01/2023 CLINICAL DATA:  Initial treatment strategy for non-small-cell lung cancer. EXAM: NUCLEAR  MEDICINE PET SKULL BASE TO THIGH TECHNIQUE: 7.7 mCi F-18 FDG was injected intravenously. Full-ring PET imaging was performed from the skull base to thigh after the radiotracer. CT data was obtained and used for attenuation correction and anatomic localization. Fasting blood glucose: 117 mg/dl COMPARISON:  Abdomen and pelvis CT 11/27/2023.  Chest CT 11/25/2023 FINDINGS: Mediastinal blood pool activity: SUV max 2.4 Liver activity: SUV max NA NECK: Tiny lymph nodes in the right lower neck show low level uptake, potentially metastatic. Incidental CT findings: None. CHEST: Medial right upper lung mass is markedly hypermetabolic with SUV max = 9.3 with associated hypermetabolic central nodularity in the right suprahilar lung. 2.4 cm short axis right supraclavicular lymphadenopathy is hypermetabolic with SUV max = 6.9. Focal uptake identified along the posterior aspect of the inferior left thyroid  lobe. Noncontrast CT imaging shows a an apparent 15 mm posterior left thyroid  nodule on 47/4. SUV max = 6.7. Hypermetabolic mediastinal and hilar lymph nodes evident. Small lymph nodes in the axillary regions bilaterally are FDG avid with SUV max = 5.6 on the left and 5.1 on the right. 12 mm subpleural lower left lung nodule on 89/4 is hypermetabolic with SUV max = 7.5. Incidental CT findings: Enlargement of the pulmonary outflow tract and main pulmonary arteries suggests pulmonary arterial hypertension. Mild atherosclerotic calcification is noted in the wall of the thoracic aorta. Centrilobular and paraseptal emphysema evident. ABDOMEN/PELVIS: No abnormal hypermetabolic activity within the liver, pancreas, adrenal glands, or spleen. No hypermetabolic lymph nodes in the abdomen. Bilateral external iliac and groin lymphadenopathy noted. A 12 mm short axis left external iliac node on 173/4 is hypermetabolic with SUV max = 5.2. 13 mm short axis right external iliac node on 174/4 is hypermetabolic with SUV max = 4.6. Lymph nodes in  the groin regions show low level hypermetabolism ( SUV max = 3.0). Incidental CT findings: None. SKELETON: Radiotracer accumulation in the marrow space is diffusely mottled. No definite bony metastatic involvement. 1 particular focus of uptake in the posterior right iliac crest demonstrates SUV max = 4.7 with no underlying CT abnormality (corresponding to image 149/4). Incidental CT findings: None. IMPRESSION: 1. Markedly hypermetabolic medial right upper lung mass with associated hypermetabolic central nodularity in the right suprahilar lung. Imaging features compatible with primary bronchogenic carcinoma. 2. Hypermetabolic right supraclavicular, mediastinal, hilar, and axillary lymphadenopathy consistent with metastatic disease. Hypermetabolic external iliac lymphadenopathy identified bilaterally in the pelvis with mildly hypermetabolic groin lymphadenopathy. Metastatic disease not excluded. 3. 12 mm subpleural lower left lung nodule is hypermetabolic and compatible with metastatic disease or synchronous primary neoplasm. 4. Hypermetabolic 15 mm posterior left thyroid  nodule on noncontrast CT imaging. Recommend thyroid  US  and biopsy (ref: J Am Coll Radiol. 2015 Feb;12(2): 143-50). 5. Enlargement of the pulmonary outflow tract and main pulmonary arteries suggests pulmonary arterial hypertension. 6. Aortic Atherosclerosis (ICD10-I70.0) and Emphysema (ICD10-J43.9). Electronically Signed   By: Camellia Candle M.D.   On: 12/01/2023 09:44   CT ABDOMEN PELVIS W CONTRAST Result Date: 11/27/2023 CLINICAL DATA:  Non-small cell lung cancer (NSCLC), Staging for lung cancer * Tracking Code: BO * EXAM: CT ABDOMEN AND PELVIS WITH CONTRAST TECHNIQUE: Multidetector CT imaging of the abdomen and pelvis was performed using the  standard protocol following bolus administration of intravenous contrast. RADIATION DOSE REDUCTION: This exam was performed according to the departmental dose-optimization program which includes automated  exposure control, adjustment of the mA and/or kV according to patient size and/or use of iterative reconstruction technique. CONTRAST:  OMNIPAQUE  IOHEXOL  300 MG/ML  SOLN COMPARISON:  None Available. FINDINGS: Lower chest: There are emphysematous changes in the visualized bilateral lungs. There also patchy areas of atelectasis/scarring. No mass or consolidation. No pleural or pericardial effusion. Normal cardiac size. Hepatobiliary: The liver is normal in size. Non-cirrhotic configuration. No suspicious mass. These is mild diffuse hepatic steatosis. No intrahepatic or extrahepatic bile duct dilation. No calcified gallstones. Normal gallbladder wall thickness. No pericholecystic inflammatory changes. Pancreas: Unremarkable. No pancreatic ductal dilatation or surrounding inflammatory changes. Spleen: Within normal limits. No focal lesion. Adrenals/Urinary Tract: There is an indeterminate 8 x 10 mm right adrenal nodule, which can not be characterized as an adenoma. However, this nodule was present on the prior study from 07/27/2019. Unremarkable left adrenal gland. No suspicious renal mass. There is a subcentimeter sized hypoattenuating structure in the right kidney interpolar region, laterally, which is too small to adequately characterize. No hydronephrosis. No renal or ureteric calculi. Unremarkable urinary bladder. Stomach/Bowel: No disproportionate dilation of the small or large bowel loops. No evidence of abnormal bowel wall thickening or inflammatory changes. The appendix is unremarkable. Vascular/Lymphatic: No ascites or pneumoperitoneum. No abdominal or pelvic lymphadenopathy, by size criteria. No aneurysmal dilation of the major abdominal arteries. There are mild peripheral atherosclerotic vascular calcifications of the aorta and its major branches. Reproductive: Retroverted lobulated uterus containing several ill-defined hyperattenuating areas, favored to represent leiomyomas. No large adnexal mass  seen. Other: There is a tiny fat containing umbilical hernia. The soft tissues and abdominal wall are otherwise unremarkable. Musculoskeletal: No suspicious osseous lesions. There are mild multilevel degenerative changes in the visualized spine. IMPRESSION: 1. No metastatic lung carcinoma seen within the abdomen or pelvis. 2. Multiple other nonacute observations, as described above. Electronically Signed   By: Ree Molt M.D.   On: 11/27/2023 20:44   MR BRAIN W WO CONTRAST Result Date: 11/26/2023 CLINICAL DATA:  Non-small cell lung cancer, staging EXAM: MRI HEAD WITHOUT AND WITH CONTRAST TECHNIQUE: Multiplanar, multiecho pulse sequences of the brain and surrounding structures were obtained without and with intravenous contrast. CONTRAST:  7mL GADAVIST  GADOBUTROL  1 MMOL/ML IV SOLN COMPARISON:  None Available. FINDINGS: Brain: No restricted diffusion to suggest acute or subacute infarct. No abnormal parenchymal or meningeal enhancement. No acute hemorrhage, mass, mass effect, or midline shift. No hydrocephalus or extra-axial collection. Pituitary and craniocervical junction within normal limits. No hemosiderin deposition to suggest remote hemorrhage. Normal cerebral volume for age. Vascular: Normal arterial flow voids. Normal arterial and venous enhancement. Skull and upper cervical spine: Normal marrow signal. Sinuses/Orbits: Clear paranasal sinuses. No acute finding in the orbits. Other: The mastoid air cells are well aerated. IMPRESSION: No acute intracranial process. No evidence of intracranial metastatic disease. Electronically Signed   By: Donald Campion M.D.   On: 11/26/2023 22:28   US  CORE BIOPSY (LYMPH NODES) Result Date: 11/26/2023 INDICATION: 58 year old woman with right upper lobe lung mass and right neck lymphadenopathy presents to IR for ultrasound-guided biopsy of right supraclavicular lymph node. EXAM: Ultrasound-guided biopsy of right supraclavicular lymph node MEDICATIONS: None.  ANESTHESIA/SEDATION: None COMPLICATIONS: None immediate. PROCEDURE: Informed written consent was obtained from the patient after a thorough discussion of the procedural risks, benefits and alternatives. All questions were addressed. Maximal Sterile Barrier  Technique was utilized including caps, mask, sterile gowns, sterile gloves, sterile drape, hand hygiene and skin antiseptic. A timeout was performed prior to the initiation of the procedure. Patient position supine on the ultrasound table. Right neck skin prepped and draped in usual sterile fashion. Following local lidocaine  administration, 6- 18 gauge cores were obtained from the enlarged right supraclavicular lymph node utilizing continuous ultrasound guidance. Samples were sent to pathology in formalin. Needle removed and hemostasis achieved with 2 minutes of manual compression. Post procedure ultrasound images showed no evidence of significant hemorrhage. IMPRESSION: Successful ultrasound-guided biopsy of right supraclavicular lymph node. Electronically Signed   By: Aliene Lloyd M.D.   On: 11/26/2023 16:48   MR THORACIC SPINE W WO CONTRAST Result Date: 11/25/2023 CLINICAL DATA:  Metastatic disease evaluation EXAM: MRI THORACIC WITHOUT AND WITH CONTRAST TECHNIQUE: Multiplanar and multiecho pulse sequences of the thoracic spine were obtained without and with intravenous contrast. CONTRAST:  7mL GADAVIST  GADOBUTROL  1 MMOL/ML IV SOLN COMPARISON:  No prior MRI of the thoracic spine available, correlation is made with CT chest 11/25/2023 FINDINGS: Alignment: No listhesis. Preservation of the normal thoracic kyphosis. Vertebrae: No acute fracture, evidence of discitis, or suspicious osseous lesion. No abnormal enhancement in the osseous structures. Cord:  Normal signal and morphology.  No abnormal enhancement. Paraspinal and other soft tissues: Enhancing mass in the right lung, invading the mediastinum, which was better evaluated on the 11/25/2023 CT chest. This  mass is adjacent to the anterior right aspect of the C7-T4 vertebral bodies, but the cortical signal appears preserved, and no definite osseous invasion is seen. The mass appears to invade the right neural foramen at T1-T2 (series 24, images 4-5). Disc levels: No significant spinal canal stenosis or osseous neural foraminal narrowing. As described above, the enhancing mass appears to invade the right neural foramen at T1-T2, and may abut the exiting right T1 nerve roots (series 18, image 6). IMPRESSION: 1. Enhancing mass in the right lung, invading the mediastinum, which was better evaluated on the 11/25/2023 CT chest. This mass is adjacent to the anterior right aspect of the C7-T4 vertebral bodies, but the cortical signal appears preserved, and no definite osseous invasion is seen. The mass appears to invade the right neural foramen at T1-T2, and may abut the exiting right T1 nerve roots. 2. No evidence of metastatic disease in the thoracic spine. Electronically Signed   By: Donald Campion M.D.   On: 11/25/2023 20:52   CT CHEST WO CONTRAST Result Date: 11/25/2023 CLINICAL DATA:  Mediastinal mass EXAM: CT CHEST WITHOUT CONTRAST TECHNIQUE: Multidetector CT imaging of the chest was performed following the standard protocol without IV contrast. RADIATION DOSE REDUCTION: This exam was performed according to the departmental dose-optimization program which includes automated exposure control, adjustment of the mA and/or kV according to patient size and/or use of iterative reconstruction technique. COMPARISON:  CT neck 11/25/2023 FINDINGS: Cardiovascular: Atheromatous vascular calcification in the aortic arch and left anterior descending coronary artery. Moderate cardiomegaly. Mediastinum/Nodes: Right lower neck level V lymph node measures 2.4 cm in short axis on image 30 series 2. Right supraclavicular node 0.9 cm in short axis on image 6 series 2. Index left axillary node 1.2 cm in short axis on image 11 series 2.  Index right axillary node 1.2 cm in short axis on image 16 series 2. There is a right upper lobe mass invading the mediastinum and displacing the trachea and esophagus to the left. 1.5 cm hypodense left thyroid  lobe lesion on image 6  series 2. Lungs/Pleura: Presumed right upper lobe lung mass invading the mediastinum measures 6.6 by 4.5 cm on image 11 series 2, and displaces the trachea and esophagus to the left. Adjacent right suprahilar nodularity including a 3.1 by 1.8 cm tangential mass on image 43 series 4. The B1A apical segmental bronchus is occluded. Mild atelectasis along both diaphragms. Centrilobular emphysema. Mild airspace opacity posteriorly in the right upper lobe on image 64 series 4, probably from atelectasis. Upper Abdomen: Unremarkable Musculoskeletal: Unremarkable IMPRESSION: 1. Presumed right upper lobe lung mass invading the mediastinum and displacing the trachea and esophagus to the left, measuring 6.6 cm in long axis. Adjacent right suprahilar nodularity including a 3.1 by 1.8 cm tangential mass. The B1A apical segmental bronchus is occluded. 2. Right lower neck level V lymph node measures 2.4 cm in short axis. Right supraclavicular node 0.9 cm in short axis. Index bilateral axillary nodes are mildly enlarged. Findings are compatible with metastatic adenopathy. 3. 1.5 cm hypodense left thyroid  lobe lesion. Recommend thyroid  US  (ref: J Am Coll Radiol. 2015 Feb;12(2): 143-50). 4. Moderate cardiomegaly. 5. Aortic and coronary atherosclerosis. Aortic Atherosclerosis (ICD10-I70.0) and Emphysema (ICD10-J43.9). Electronically Signed   By: Ryan Salvage M.D.   On: 11/25/2023 17:07   CT Soft Tissue Neck W Contrast Result Date: 11/25/2023 CLINICAL DATA:  Neck mass, non pulsatile lymph node. Patient reports a palpable knot in the right side his neck. EXAM: CT NECK WITH CONTRAST TECHNIQUE: Multidetector CT imaging of the neck was performed using the standard protocol following the bolus  administration of intravenous contrast. RADIATION DOSE REDUCTION: This exam was performed according to the departmental dose-optimization program which includes automated exposure control, adjustment of the mA and/or kV according to patient size and/or use of iterative reconstruction technique. CONTRAST:  60mL OMNIPAQUE  IOHEXOL  300 MG/ML  SOLN COMPARISON:  CT cervical spine without contrast of the same day. FINDINGS: Pharynx and larynx: Mild adenoid hypertrophy is noted. No discrete lesion is present. The oropharynx is within normal limits. Tongue base is. Vallecula and epiglottis are within normal limits. Aryepiglottic folds and piriform sinuses are clear. Vocal cords are midline and symmetric. Trachea is clear. Salivary glands: The submandibular and parotid glands and ducts are within normal limits. Thyroid : Heterogeneous nodule the inferior left lobe measures 2.2 x 2.2 cm on sagittal images. Lymph nodes: A right supraclavicular nodal mass measures 3.6 x 2.4 x 3.7 cm. A right level 4 heterogeneous node measures 15 mm. No significant upper cervical adenopathy is present. A heterogeneous right paraspinous mass begins at the level of C6-7 and extends into the mediastinum. Tumor surrounds the proximal first rib. Tumor extends into the right T1-2 foramen. No discrete osseous destruction is present. Vascular: No significant vascular calcifications are present. Limited intracranial: Within normal limits. Visualized orbits: The globes and orbits are within normal limits. Mastoids and visualized paranasal sinuses: The paranasal sinuses and mastoid air cells are clear. Skeleton: Multilevel degenerative changes are present in the cervical spine. Uncovertebral spurring contributes to right foraminal stenosis at C6-7. No focal osseous lesions are present. Upper chest: Extensive centrilobular emphysematous changes are present. IMPRESSION: 1. Heterogeneous right paraspinous mass begins at the level of C6-7 and extends into the  mediastinum. Tumor surrounds the proximal first rib. Tumor extends into the right T1-2 foramen. This is most concerning for a lung primary neoplasm. Recommend CT of the chest with and without contrast as well as MRI of the thoracic spine without and with contrast 2. Right supraclavicular nodal mass measures 3.6 x 2.4  x 3.7 cm, consistent with metastatic disease. 3. Right level 4 heterogeneous node measures 15 mm. 4. Heterogeneous nodule the inferior left lobe measures 2.2 x 2.2 cm on sagittal images. Recommend non-emergent thyroid  ultrasound. Reference: J Am Coll Radiol. 2015 Feb;12(2): 143-50 5. Multilevel degenerative changes in the cervical spine. 6.  Emphysema (ICD10-J43.9). Electronically Signed   By: Lonni Necessary M.D.   On: 11/25/2023 16:19   CT Cervical Spine Wo Contrast Result Date: 11/25/2023 CLINICAL DATA:  Cervical radiculopathy, infection suspected, no prior imaging EXAM: CT CERVICAL SPINE WITHOUT CONTRAST TECHNIQUE: Multidetector CT imaging of the cervical spine was performed without intravenous contrast. Multiplanar CT image reconstructions were also generated. RADIATION DOSE REDUCTION: This exam was performed according to the departmental dose-optimization program which includes automated exposure control, adjustment of the mA and/or kV according to patient size and/or use of iterative reconstruction technique. COMPARISON:  None Available. FINDINGS: Alignment: Facet joints are aligned without dislocation or traumatic listhesis. Dens and lateral masses are aligned. Skull base and vertebrae: No acute fracture of the cervical spine. Heterogeneity of the osseous structures although no well-defined lytic or sclerotic bone lesion. Soft tissues and spinal canal: No prevertebral fluid or swelling. No visible canal hematoma. Disc levels: Mild degenerative endplate spurring. Slight disc height loss at C6-7. Minimal facet joint spurring. Upper chest: Right supraclavicular mass and partially imaged  superior mediastinal mass. 1.7 cm left thyroid  lobe nodule. Other: None. IMPRESSION: 1. No acute fracture or traumatic listhesis of the cervical spine. 2. Right supraclavicular mass and partially imaged superior mediastinal mass. Attention on forthcoming CT of the neck. Contrast enhanced CT of the chest is recommended to more fully assess the mediastinal mass. 3. 1.7 cm left thyroid  lobe nodule. Recommend nonemergent thyroid  US  (ref: J Am Coll Radiol. 2015 Feb;12(2): 143-50). Electronically Signed   By: Mabel Converse D.O.   On: 11/25/2023 15:32      Subjective: Patient seen and examined admitted.  Denies worsening shortness breath, vomiting or fever.  Feels okay to go home today after radiation.  Discharge Exam: Vitals:   12/09/23 0537 12/09/23 0738  BP: (!) 140/76   Pulse: 90   Resp: 18   Temp: 98.2 F (36.8 C)   SpO2: 93% 93%    General: Pt is alert, awake, not in acute distress.  On room air. Cardiovascular: rate controlled, S1/S2 + Respiratory: bilateral decreased breath sounds at bases with some scattered rales Abdominal: Soft, NT, ND, bowel sounds + Extremities: no edema, no cyanosis    The results of significant diagnostics from this hospitalization (including imaging, microbiology, ancillary and laboratory) are listed below for reference.     Microbiology: Recent Results (from the past 240 hours)  Resp panel by RT-PCR (RSV, Flu A&B, Covid) Anterior Nasal Swab     Status: None   Collection Time: 12/02/23  1:46 PM   Specimen: Anterior Nasal Swab  Result Value Ref Range Status   SARS Coronavirus 2 by RT PCR NEGATIVE NEGATIVE Final    Comment: (NOTE) SARS-CoV-2 target nucleic acids are NOT DETECTED.  The SARS-CoV-2 RNA is generally detectable in upper respiratory specimens during the acute phase of infection. The lowest concentration of SARS-CoV-2 viral copies this assay can detect is 138 copies/mL. A negative result does not preclude SARS-Cov-2 infection and should  not be used as the sole basis for treatment or other patient management decisions. A negative result may occur with  improper specimen collection/handling, submission of specimen other than nasopharyngeal swab, presence of viral mutation(s) within  the areas targeted by this assay, and inadequate number of viral copies(<138 copies/mL). A negative result must be combined with clinical observations, patient history, and epidemiological information. The expected result is Negative.  Fact Sheet for Patients:  bloggercourse.com  Fact Sheet for Healthcare Providers:  seriousbroker.it  This test is no t yet approved or cleared by the [place]  FDA and  has been authorized for detection and/or diagnosis of SARS-CoV-2 by FDA under an Emergency Use Authorization (EUA). This EUA will remain  in effect (meaning this test can be used) for the duration of the COVID-19 declaration under Section 564(b)(1) of the Act, 21 U.S.C.section 360bbb-3(b)(1), unless the authorization is terminated  or revoked sooner.       Influenza A by PCR NEGATIVE NEGATIVE Final   Influenza B by PCR NEGATIVE NEGATIVE Final    Comment: (NOTE) The Xpert Xpress SARS-CoV-2/FLU/RSV plus assay is intended as an aid in the diagnosis of influenza from Nasopharyngeal swab specimens and should not be used as a sole basis for treatment. Nasal washings and aspirates are unacceptable for Xpert Xpress SARS-CoV-2/FLU/RSV testing.  Fact Sheet for Patients: bloggercourse.com  Fact Sheet for Healthcare Providers: seriousbroker.it  This test is not yet approved or cleared by the [place]  FDA and has been authorized for detection and/or diagnosis of SARS-CoV-2 by FDA under an Emergency Use Authorization (EUA). This EUA will remain in effect (meaning this test can be used) for the duration of the COVID-19 declaration under  Section 564(b)(1) of the Act, 21 U.S.C. section 360bbb-3(b)(1), unless the authorization is terminated or revoked.     Resp Syncytial Virus by PCR NEGATIVE NEGATIVE Final    Comment: (NOTE) Fact Sheet for Patients: bloggercourse.com  Fact Sheet for Healthcare Providers: seriousbroker.it  This test is not yet approved or cleared by the [place]  FDA and has been authorized for detection and/or diagnosis of SARS-CoV-2 by FDA under an Emergency Use Authorization (EUA). This EUA will remain in effect (meaning this test can be used) for the duration of the COVID-19 declaration under Section 564(b)(1) of the Act, 21 U.S.C. section 360bbb-3(b)(1), unless the authorization is terminated or revoked.  Performed at [facility], [address] [place], [place] [address]      Labs: BNP (last 3 results) Recent Labs    12/02/23 0925  BNP 50.6   Basic Metabolic Panel: Recent Labs  Lab 12/04/23 0500 12/05/23 0500 12/06/23 0620 12/07/23 0541 12/08/23 0529 12/09/23 0539  NA 139 138 140 139 141 140  K 4.0 4.1 4.2 4.1 3.7 3.3*  CL 112* 112* 113* 112* 110 108  CO2 20* 17* 20* 23 25 23   GLUCOSE 111* 237* 195* 104* 109* 128*  BUN 35* 35* 31* 23* 20 13  CREATININE 1.02* 0.90 0.77 0.59 0.62 0.61  CALCIUM  5.8* 5.9* 5.9* 5.8* 5.9* 6.0*  MG 2.6* 3.1* 2.7*  --   --   --   PHOS 2.3* 1.6* 2.0* 2.0* 3.2  --    Liver Function Tests: Recent Labs  Lab 12/05/23 0500 12/06/23 0620 12/07/23 0541 12/08/23 0529 12/09/23 0539  AST 64* 40 37 92* 32  ALT 34 32 35 78* 51*  ALKPHOS 83 74 72 82 77  BILITOT 0.3 0.3 0.5 0.6 0.4  PROT 7.5 6.7 6.8 6.4* 6.1*  ALBUMIN 2.3* 2.1* 2.2* 2.2* 2.2*   No results for input(s): LIPASE, AMYLASE in the last 168 hours. No results for input(s): AMMONIA in the last 168 hours. CBC: Recent Labs  Lab 12/05/23  0500 12/06/23 0544 12/07/23 0541 12/08/23 0529 12/09/23 0539  WBC 3.0*  6.0 7.2 6.2 5.5  NEUTROABS 2.6 5.5 6.6 5.7 4.9  HGB 11.8* 11.7* 12.0 11.1* 9.9*  HCT 36.6 36.8 38.4 35.0* 30.6*  MCV 86.3 86.4 87.5 86.4 84.8  PLT 287 322 322 242 216   Cardiac Enzymes: No results for input(s): CKTOTAL, CKMB, CKMBINDEX, TROPONINI in the last 168 hours. BNP: Invalid input(s): POCBNP CBG: No results for input(s): GLUCAP in the last 168 hours. D-Dimer No results for input(s): DDIMER in the last 72 hours. Hgb A1c No results for input(s): HGBA1C in the last 72 hours. Lipid Profile No results for input(s): CHOL, HDL, LDLCALC, TRIG, CHOLHDL, LDLDIRECT in the last 72 hours. Thyroid  function studies No results for input(s): TSH, T4TOTAL, T3FREE, THYROIDAB in the last 72 hours.  Invalid input(s): FREET3 Anemia work up No results for input(s): VITAMINB12, FOLATE, FERRITIN, TIBC, IRON , RETICCTPCT in the last 72 hours. Urinalysis    Component Value Date/Time   COLORURINE YELLOW 07/24/2019 1954   APPEARANCEUR HAZY (A) 07/24/2019 1954   LABSPEC 1.017 07/24/2019 1954   PHURINE 5.0 07/24/2019 1954   GLUCOSEU NEGATIVE 07/24/2019 1954   HGBUR SMALL (A) 07/24/2019 1954   BILIRUBINUR NEGATIVE 07/24/2019 1954   KETONESUR NEGATIVE 07/24/2019 1954   PROTEINUR 30 (A) 07/24/2019 1954   UROBILINOGEN 0.2 01/25/2013 0823   NITRITE NEGATIVE 07/24/2019 1954   LEUKOCYTESUR LARGE (A) 07/24/2019 1954   Sepsis Labs Recent Labs  Lab 12/06/23 0544 12/07/23 0541 12/08/23 0529 12/09/23 0539  WBC 6.0 7.2 6.2 5.5   Microbiology Recent Results (from the past 240 hours)  Resp panel by RT-PCR (RSV, Flu A&B, Covid) Anterior Nasal Swab     Status: None   Collection Time: 12/02/23  1:46 PM   Specimen: Anterior Nasal Swab  Result Value Ref Range Status   SARS Coronavirus 2 by RT PCR NEGATIVE NEGATIVE Final    Comment: (NOTE) SARS-CoV-2 target nucleic acids are NOT DETECTED.  The SARS-CoV-2 RNA is generally detectable in upper  respiratory specimens during the acute phase of infection. The lowest concentration of SARS-CoV-2 viral copies this assay can detect is 138 copies/mL. A negative result does not preclude SARS-Cov-2 infection and should not be used as the sole basis for treatment or other patient management decisions. A negative result may occur with  improper specimen collection/handling, submission of specimen other than nasopharyngeal swab, presence of viral mutation(s) within the areas targeted by this assay, and inadequate number of viral copies(<138 copies/mL). A negative result must be combined with clinical observations, patient history, and epidemiological information. The expected result is Negative.  Fact Sheet for Patients:  bloggercourse.com  Fact Sheet for Healthcare Providers:  seriousbroker.it  This test is no t yet approved or cleared by the [place]  FDA and  has been authorized for detection and/or diagnosis of SARS-CoV-2 by FDA under an Emergency Use Authorization (EUA). This EUA will remain  in effect (meaning this test can be used) for the duration of the COVID-19 declaration under Section 564(b)(1) of the Act, 21 U.S.C.section 360bbb-3(b)(1), unless the authorization is terminated  or revoked sooner.       Influenza A by PCR NEGATIVE NEGATIVE Final   Influenza B by PCR NEGATIVE NEGATIVE Final    Comment: (NOTE) The Xpert Xpress SARS-CoV-2/FLU/RSV plus assay is intended as an aid in the diagnosis of influenza from Nasopharyngeal swab specimens and should not be used as a sole basis for treatment. Nasal washings and aspirates are unacceptable for  Xpert Xpress SARS-CoV-2/FLU/RSV testing.  Fact Sheet for Patients: bloggercourse.com  Fact Sheet for Healthcare Providers: seriousbroker.it  This test is not yet approved or cleared by the [place]  FDA and has been  authorized for detection and/or diagnosis of SARS-CoV-2 by FDA under an Emergency Use Authorization (EUA). This EUA will remain in effect (meaning this test can be used) for the duration of the COVID-19 declaration under Section 564(b)(1) of the Act, 21 U.S.C. section 360bbb-3(b)(1), unless the authorization is terminated or revoked.     Resp Syncytial Virus by PCR NEGATIVE NEGATIVE Final    Comment: (NOTE) Fact Sheet for Patients: bloggercourse.com  Fact Sheet for Healthcare Providers: seriousbroker.it  This test is not yet approved or cleared by the [place]  FDA and has been authorized for detection and/or diagnosis of SARS-CoV-2 by FDA under an Emergency Use Authorization (EUA). This EUA will remain in effect (meaning this test can be used) for the duration of the COVID-19 declaration under Section 564(b)(1) of the Act, 21 U.S.C. section 360bbb-3(b)(1), unless the authorization is terminated or revoked.  Performed at [facility], [address], [place], [place] [address]      Time coordinating discharge: 35 minutes  SIGNED:   Sophie Mao, MD  [facility] 12/09/2023, 10:26 AM

## 2023-12-10 ENCOUNTER — Encounter: Payer: Self-pay | Admitting: *Deleted

## 2023-12-10 ENCOUNTER — Ambulatory Visit
Admission: RE | Admit: 2023-12-10 | Discharge: 2023-12-10 | Disposition: A | Discharge status: Home / Self Care | Payer: 59 | Source: Ambulatory Visit | Attending: Radiation Oncology | Admitting: Radiation Oncology

## 2023-12-10 ENCOUNTER — Other Ambulatory Visit: Payer: Self-pay

## 2023-12-10 DIAGNOSIS — Z510 Encounter for antineoplastic radiation therapy: Secondary | ICD-10-CM | POA: Diagnosis not present

## 2023-12-10 DIAGNOSIS — C3411 Malignant neoplasm of upper lobe, right bronchus or lung: Secondary | ICD-10-CM | POA: Diagnosis present

## 2023-12-10 LAB — RAD ONC ARIA SESSION SUMMARY
Course Elapsed Days: 8
Plan Fractions Treated to Date: 7
Plan Prescribed Dose Per Fraction: 2.5 Gy
Plan Total Fractions Prescribed: 15
Plan Total Prescribed Dose: 37.5 Gy
Reference Point Dosage Given to Date: 17.5 Gy
Reference Point Session Dosage Given: 2.5 Gy
Session Number: 7

## 2023-12-10 NOTE — Progress Notes (Signed)
 Patient discharged from the hospital. Dr Timmy would like to see her next week. Scheduled for 12/17/2023.  Oncology Nurse Navigator Documentation     12/10/2023    8:15 AM  Oncology Nurse Navigator Flowsheets  Navigator Follow Up Date: 12/17/2023  Navigator Follow Up Reason: New Patient Appointment  Navigator Location [facility]  Navigator Encounter Type Appt/Treatment Plan Review  Patient Visit Type MedOnc  Treatment Phase Active Tx  Barriers/Navigation Needs Coordination of Care  Interventions Coordination of Care  Acuity Level 2-Minimal Needs (1-2 Barriers Identified)  Coordination of Care Appts  Time Spent with Patient 15

## 2023-12-13 ENCOUNTER — Other Ambulatory Visit: Payer: Self-pay

## 2023-12-13 ENCOUNTER — Ambulatory Visit
Admission: RE | Admit: 2023-12-13 | Discharge: 2023-12-13 | Disposition: A | Discharge status: Home / Self Care | Payer: 59 | Source: Ambulatory Visit | Attending: Radiation Oncology | Admitting: Radiation Oncology

## 2023-12-13 ENCOUNTER — Encounter (HOSPITAL_COMMUNITY): Payer: Self-pay | Admitting: Hematology & Oncology

## 2023-12-13 DIAGNOSIS — Z510 Encounter for antineoplastic radiation therapy: Secondary | ICD-10-CM | POA: Diagnosis not present

## 2023-12-13 LAB — RAD ONC ARIA SESSION SUMMARY
Course Elapsed Days: 11
Plan Fractions Treated to Date: 8
Plan Prescribed Dose Per Fraction: 2.5 Gy
Plan Total Fractions Prescribed: 15
Plan Total Prescribed Dose: 37.5 Gy
Reference Point Dosage Given to Date: 20 Gy
Reference Point Session Dosage Given: 2.5 Gy
Session Number: 8

## 2023-12-14 ENCOUNTER — Ambulatory Visit
Admission: RE | Admit: 2023-12-14 | Discharge: 2023-12-14 | Disposition: A | Discharge status: Home / Self Care | Payer: 59 | Source: Ambulatory Visit | Attending: Radiation Oncology | Admitting: Radiation Oncology

## 2023-12-14 ENCOUNTER — Telehealth: Payer: Self-pay

## 2023-12-14 ENCOUNTER — Other Ambulatory Visit: Payer: Self-pay

## 2023-12-14 ENCOUNTER — Telehealth: Payer: Self-pay | Admitting: Dietician

## 2023-12-14 DIAGNOSIS — Z510 Encounter for antineoplastic radiation therapy: Secondary | ICD-10-CM | POA: Diagnosis not present

## 2023-12-14 LAB — RAD ONC ARIA SESSION SUMMARY
Course Elapsed Days: 12
Plan Fractions Treated to Date: 9
Plan Prescribed Dose Per Fraction: 2.5 Gy
Plan Total Fractions Prescribed: 15
Plan Total Prescribed Dose: 37.5 Gy
Reference Point Dosage Given to Date: 22.5 Gy
Reference Point Session Dosage Given: 2.5 Gy
Session Number: 9

## 2023-12-14 NOTE — Telephone Encounter (Signed)
 Patient screened on MST. First attempt to reach, patient on her way out to go to radiation.. Provided my cell# in a text to return call to set up a nutrition consult.  Micheline Craven, RDN, LDN Registered Dietitian, [facility] Part Time Remote (Usual office hours: Tuesday-Thursday) Cell: [phone]

## 2023-12-14 NOTE — Telephone Encounter (Signed)
Notified Patient of completion of FMLA forms. Fax transmission confirmation received. Copy of forms placed for pick-up as requested by Patient. No other needs or concerns voiced at this time.

## 2023-12-15 ENCOUNTER — Ambulatory Visit
Admission: RE | Admit: 2023-12-15 | Discharge: 2023-12-15 | Disposition: A | Discharge status: Home / Self Care | Payer: 59 | Source: Ambulatory Visit | Attending: Radiation Oncology | Admitting: Radiation Oncology

## 2023-12-15 ENCOUNTER — Other Ambulatory Visit: Payer: Self-pay

## 2023-12-15 LAB — RAD ONC ARIA SESSION SUMMARY
Course Elapsed Days: 13
Plan Fractions Treated to Date: 10
Plan Prescribed Dose Per Fraction: 2.5 Gy
Plan Total Fractions Prescribed: 15
Plan Total Prescribed Dose: 37.5 Gy
Reference Point Dosage Given to Date: 25 Gy
Reference Point Session Dosage Given: 2.5 Gy
Session Number: 10

## 2023-12-16 ENCOUNTER — Ambulatory Visit
Admission: RE | Admit: 2023-12-16 | Discharge: 2023-12-16 | Disposition: A | Discharge status: Home / Self Care | Payer: 59 | Source: Ambulatory Visit | Attending: Radiation Oncology | Admitting: Radiation Oncology

## 2023-12-16 ENCOUNTER — Other Ambulatory Visit: Payer: Self-pay

## 2023-12-16 LAB — RAD ONC ARIA SESSION SUMMARY
Course Elapsed Days: 14
Plan Fractions Treated to Date: 11
Plan Prescribed Dose Per Fraction: 2.5 Gy
Plan Total Fractions Prescribed: 15
Plan Total Prescribed Dose: 37.5 Gy
Reference Point Dosage Given to Date: 27.5 Gy
Reference Point Session Dosage Given: 2.5 Gy
Session Number: 11

## 2023-12-17 ENCOUNTER — Inpatient Hospital Stay (HOSPITAL_BASED_OUTPATIENT_CLINIC_OR_DEPARTMENT_OTHER): Payer: 59 | Admitting: Hematology & Oncology

## 2023-12-17 ENCOUNTER — Telehealth: Payer: Self-pay | Admitting: *Deleted

## 2023-12-17 ENCOUNTER — Inpatient Hospital Stay: Payer: 59 | Attending: Hematology & Oncology

## 2023-12-17 ENCOUNTER — Encounter (HOSPITAL_COMMUNITY): Payer: Self-pay | Admitting: *Deleted

## 2023-12-17 ENCOUNTER — Encounter: Payer: Self-pay | Admitting: *Deleted

## 2023-12-17 ENCOUNTER — Inpatient Hospital Stay (HOSPITAL_COMMUNITY)
Admission: EM | Admit: 2023-12-17 | Discharge: 2023-12-24 | DRG: 392 | Disposition: A | Discharge status: Home / Self Care | Payer: 59 | Attending: Internal Medicine | Admitting: Internal Medicine

## 2023-12-17 ENCOUNTER — Ambulatory Visit: Admission: RE | Admit: 2023-12-17 | Payer: 59 | Source: Ambulatory Visit

## 2023-12-17 ENCOUNTER — Encounter: Payer: Self-pay | Admitting: Hematology & Oncology

## 2023-12-17 ENCOUNTER — Other Ambulatory Visit: Payer: Self-pay

## 2023-12-17 ENCOUNTER — Emergency Department (HOSPITAL_COMMUNITY): Payer: 59

## 2023-12-17 ENCOUNTER — Ambulatory Visit: Payer: 59

## 2023-12-17 ENCOUNTER — Inpatient Hospital Stay: Payer: 59

## 2023-12-17 VITALS — BP 103/75 | HR 110 | Temp 99.2°F | Resp 19 | Ht 66.0 in | Wt 138.0 lb

## 2023-12-17 DIAGNOSIS — E440 Moderate protein-calorie malnutrition: Secondary | ICD-10-CM | POA: Insufficient documentation

## 2023-12-17 DIAGNOSIS — T451X5A Adverse effect of antineoplastic and immunosuppressive drugs, initial encounter: Secondary | ICD-10-CM | POA: Diagnosis present

## 2023-12-17 DIAGNOSIS — J189 Pneumonia, unspecified organism: Secondary | ICD-10-CM

## 2023-12-17 DIAGNOSIS — R627 Adult failure to thrive: Secondary | ICD-10-CM | POA: Diagnosis present

## 2023-12-17 DIAGNOSIS — Z803 Family history of malignant neoplasm of breast: Secondary | ICD-10-CM

## 2023-12-17 DIAGNOSIS — C3411 Malignant neoplasm of upper lobe, right bronchus or lung: Principal | ICD-10-CM

## 2023-12-17 DIAGNOSIS — C770 Secondary and unspecified malignant neoplasm of lymph nodes of head, face and neck: Secondary | ICD-10-CM | POA: Insufficient documentation

## 2023-12-17 DIAGNOSIS — R5081 Fever presenting with conditions classified elsewhere: Secondary | ICD-10-CM | POA: Diagnosis present

## 2023-12-17 DIAGNOSIS — K209 Esophagitis, unspecified without bleeding: Secondary | ICD-10-CM | POA: Diagnosis present

## 2023-12-17 DIAGNOSIS — I10 Essential (primary) hypertension: Secondary | ICD-10-CM | POA: Diagnosis present

## 2023-12-17 DIAGNOSIS — Z95828 Presence of other vascular implants and grafts: Secondary | ICD-10-CM

## 2023-12-17 DIAGNOSIS — D701 Agranulocytosis secondary to cancer chemotherapy: Secondary | ICD-10-CM | POA: Diagnosis present

## 2023-12-17 DIAGNOSIS — C3491 Malignant neoplasm of unspecified part of right bronchus or lung: Secondary | ICD-10-CM | POA: Diagnosis present

## 2023-12-17 DIAGNOSIS — Z7951 Long term (current) use of inhaled steroids: Secondary | ICD-10-CM

## 2023-12-17 DIAGNOSIS — E876 Hypokalemia: Secondary | ICD-10-CM | POA: Diagnosis present

## 2023-12-17 DIAGNOSIS — Z6822 Body mass index (BMI) 22.0-22.9, adult: Secondary | ICD-10-CM

## 2023-12-17 DIAGNOSIS — Z5112 Encounter for antineoplastic immunotherapy: Secondary | ICD-10-CM | POA: Insufficient documentation

## 2023-12-17 DIAGNOSIS — K208 Other esophagitis without bleeding: Principal | ICD-10-CM | POA: Diagnosis present

## 2023-12-17 DIAGNOSIS — J449 Chronic obstructive pulmonary disease, unspecified: Secondary | ICD-10-CM | POA: Diagnosis present

## 2023-12-17 DIAGNOSIS — Z833 Family history of diabetes mellitus: Secondary | ICD-10-CM

## 2023-12-17 DIAGNOSIS — Z923 Personal history of irradiation: Secondary | ICD-10-CM | POA: Insufficient documentation

## 2023-12-17 DIAGNOSIS — Y842 Radiological procedure and radiotherapy as the cause of abnormal reaction of the patient, or of later complication, without mention of misadventure at the time of the procedure: Secondary | ICD-10-CM | POA: Diagnosis present

## 2023-12-17 DIAGNOSIS — Z8249 Family history of ischemic heart disease and other diseases of the circulatory system: Secondary | ICD-10-CM

## 2023-12-17 DIAGNOSIS — Z5111 Encounter for antineoplastic chemotherapy: Secondary | ICD-10-CM

## 2023-12-17 DIAGNOSIS — R1310 Dysphagia, unspecified: Secondary | ICD-10-CM | POA: Diagnosis present

## 2023-12-17 DIAGNOSIS — F17210 Nicotine dependence, cigarettes, uncomplicated: Secondary | ICD-10-CM | POA: Diagnosis present

## 2023-12-17 DIAGNOSIS — E538 Deficiency of other specified B group vitamins: Secondary | ICD-10-CM | POA: Diagnosis present

## 2023-12-17 DIAGNOSIS — D630 Anemia in neoplastic disease: Secondary | ICD-10-CM | POA: Diagnosis present

## 2023-12-17 DIAGNOSIS — Z791 Long term (current) use of non-steroidal anti-inflammatories (NSAID): Secondary | ICD-10-CM

## 2023-12-17 DIAGNOSIS — D509 Iron deficiency anemia, unspecified: Secondary | ICD-10-CM | POA: Diagnosis present

## 2023-12-17 DIAGNOSIS — Z79899 Other long term (current) drug therapy: Secondary | ICD-10-CM

## 2023-12-17 DIAGNOSIS — L89152 Pressure ulcer of sacral region, stage 2: Secondary | ICD-10-CM | POA: Diagnosis present

## 2023-12-17 DIAGNOSIS — C3490 Malignant neoplasm of unspecified part of unspecified bronchus or lung: Secondary | ICD-10-CM | POA: Insufficient documentation

## 2023-12-17 DIAGNOSIS — L89892 Pressure ulcer of other site, stage 2: Secondary | ICD-10-CM | POA: Diagnosis present

## 2023-12-17 LAB — CBC WITH DIFFERENTIAL (CANCER CENTER ONLY)
Abs Immature Granulocytes: 0.01 10*3/uL (ref 0.00–0.07)
Basophils Absolute: 0 10*3/uL (ref 0.0–0.1)
Basophils Relative: 1 %
Eosinophils Absolute: 0 10*3/uL (ref 0.0–0.5)
Eosinophils Relative: 0 %
HCT: 31.8 % — ABNORMAL LOW (ref 36.0–46.0)
Hemoglobin: 10.1 g/dL — ABNORMAL LOW (ref 12.0–15.0)
Immature Granulocytes: 1 %
Lymphocytes Relative: 38 %
Lymphs Abs: 0.4 10*3/uL — ABNORMAL LOW (ref 0.7–4.0)
MCH: 27 pg (ref 26.0–34.0)
MCHC: 31.8 g/dL (ref 30.0–36.0)
MCV: 85 fL (ref 80.0–100.0)
Monocytes Absolute: 0.2 10*3/uL (ref 0.1–1.0)
Monocytes Relative: 15 %
Neutro Abs: 0.5 10*3/uL — ABNORMAL LOW (ref 1.7–7.7)
Neutrophils Relative %: 45 %
Platelet Count: 184 10*3/uL (ref 150–400)
RBC: 3.74 MIL/uL — ABNORMAL LOW (ref 3.87–5.11)
RDW: 15.1 % (ref 11.5–15.5)
WBC Count: 1.1 10*3/uL — ABNORMAL LOW (ref 4.0–10.5)
nRBC: 0 % (ref 0.0–0.2)

## 2023-12-17 LAB — LACTATE DEHYDROGENASE: LDH: 304 U/L — ABNORMAL HIGH (ref 98–192)

## 2023-12-17 LAB — I-STAT CG4 LACTIC ACID, ED: Lactic Acid, Venous: 1.2 mmol/L (ref 0.5–1.9)

## 2023-12-17 LAB — CMP (CANCER CENTER ONLY)
ALT: 9 U/L (ref 0–44)
AST: 11 U/L — ABNORMAL LOW (ref 15–41)
Albumin: 3.3 g/dL — ABNORMAL LOW (ref 3.5–5.0)
Alkaline Phosphatase: 74 U/L (ref 38–126)
Anion gap: 11 (ref 5–15)
BUN: 12 mg/dL (ref 6–20)
CO2: 24 mmol/L (ref 22–32)
Calcium: 5.8 mg/dL — CL (ref 8.9–10.3)
Chloride: 109 mmol/L (ref 98–111)
Creatinine: 0.76 mg/dL (ref 0.44–1.00)
GFR, Estimated: >60 mL/min (ref >60)
Glucose, Bld: 105 mg/dL — ABNORMAL HIGH (ref 70–99)
Potassium: 3.5 mmol/L (ref 3.5–5.1)
Sodium: 144 mmol/L (ref 135–145)
Total Bilirubin: 0.3 mg/dL (ref 0.0–1.2)
Total Protein: 7.9 g/dL (ref 6.5–8.1)

## 2023-12-17 LAB — FERRITIN: Ferritin: 409 ng/mL — ABNORMAL HIGH (ref 11–307)

## 2023-12-17 LAB — IRON AND IRON BINDING CAPACITY (CC-WL,HP ONLY)
Iron: 14 ug/dL — ABNORMAL LOW (ref 28–170)
Saturation Ratios: 9 % — ABNORMAL LOW (ref 10.4–31.8)
TIBC: 164 ug/dL — ABNORMAL LOW (ref 250–450)
UIBC: 150 ug/dL (ref 148–442)

## 2023-12-17 LAB — MAGNESIUM: Magnesium: 1.8 mg/dL (ref 1.7–2.4)

## 2023-12-17 LAB — SAMPLE TO BLOOD BANK

## 2023-12-17 MED ORDER — OYSTER SHELL CALCIUM/D3 500-5 MG-MCG PO TABS
1.0000 | ORAL_TABLET | Freq: Two times a day (BID) | ORAL | Status: DC
  Administered 2023-12-17: 1 via ORAL
  Filled 2023-12-17: qty 1

## 2023-12-17 MED ORDER — SODIUM CHLORIDE 0.9 % IV SOLN
2.0000 g | Freq: Three times a day (TID) | INTRAVENOUS | Status: DC
  Administered 2023-12-18 – 2023-12-19 (×5): 2 g via INTRAVENOUS
  Filled 2023-12-17 (×5): qty 12.5

## 2023-12-17 MED ORDER — SODIUM CHLORIDE 0.9 % IV SOLN: INTRAVENOUS | Status: DC

## 2023-12-17 MED ORDER — MAGIC MOUTHWASH W/LIDOCAINE: 15.0000 mL | Freq: Four times a day (QID) | ORAL | Status: DC | PRN

## 2023-12-17 MED ORDER — DRONABINOL 2.5 MG PO CAPS
2.5000 mg | ORAL_CAPSULE | Freq: Two times a day (BID) | ORAL | Status: DC
  Administered 2023-12-18 – 2023-12-24 (×13): 2.5 mg via ORAL
  Filled 2023-12-17 (×13): qty 1

## 2023-12-17 MED ORDER — ACETAMINOPHEN 650 MG RE SUPP: 650.0000 mg | Freq: Four times a day (QID) | RECTAL | Status: DC | PRN

## 2023-12-17 MED ORDER — SODIUM CHLORIDE 0.9 % IV SOLN
2.0000 g | Freq: Once | INTRAVENOUS | Status: AC
  Administered 2023-12-17: 2 g via INTRAVENOUS
  Filled 2023-12-17: qty 12.5

## 2023-12-17 MED ORDER — FILGRASTIM-AAFI 480 MCG/0.8ML IJ SOSY
480.0000 ug | PREFILLED_SYRINGE | Freq: Every day | INTRAMUSCULAR | Status: DC
Start: 2023-12-17 — End: 2023-12-18
  Administered 2023-12-17: 480 ug via SUBCUTANEOUS
  Filled 2023-12-17 (×2): qty 0.8

## 2023-12-17 MED ORDER — CALCIUM GLUCONATE-NACL 1-0.675 GM/50ML-% IV SOLN
1.0000 g | Freq: Once | INTRAVENOUS | Status: AC
  Administered 2023-12-17: 1000 mg via INTRAVENOUS
  Filled 2023-12-17: qty 50

## 2023-12-17 MED ORDER — HYDROMORPHONE HCL 1 MG/ML IJ SOLN
0.5000 mg | INTRAMUSCULAR | Status: DC | PRN
  Administered 2023-12-18 (×2): 1 mg via INTRAVENOUS
  Filled 2023-12-17 (×2): qty 1

## 2023-12-17 MED ORDER — ENOXAPARIN SODIUM 40 MG/0.4ML IJ SOSY
40.0000 mg | PREFILLED_SYRINGE | INTRAMUSCULAR | Status: DC
  Administered 2023-12-17 – 2023-12-23 (×7): 40 mg via SUBCUTANEOUS
  Filled 2023-12-17 (×7): qty 0.4

## 2023-12-17 MED ORDER — ONDANSETRON HCL 4 MG/2ML IJ SOLN
4.0000 mg | Freq: Four times a day (QID) | INTRAMUSCULAR | Status: DC | PRN
  Administered 2023-12-18 – 2023-12-21 (×3): 4 mg via INTRAVENOUS
  Filled 2023-12-17 (×3): qty 2

## 2023-12-17 MED ORDER — ACETAMINOPHEN 325 MG PO TABS
650.0000 mg | ORAL_TABLET | Freq: Four times a day (QID) | ORAL | Status: DC | PRN
  Administered 2023-12-18 – 2023-12-22 (×6): 650 mg via ORAL
  Filled 2023-12-17 (×6): qty 2

## 2023-12-17 MED ORDER — SODIUM CHLORIDE 0.9% FLUSH
10.0000 mL | INTRAVENOUS | Status: DC | PRN
  Administered 2023-12-17: 10 mL via INTRAVENOUS

## 2023-12-17 MED ORDER — HYDROCODONE-ACETAMINOPHEN 5-325 MG PO TABS
1.0000 | ORAL_TABLET | Freq: Four times a day (QID) | ORAL | Status: DC | PRN
  Administered 2023-12-19 – 2023-12-23 (×7): 1 via ORAL
  Filled 2023-12-17 (×7): qty 1

## 2023-12-17 MED ORDER — FILGRASTIM 480 MCG/1.6ML IJ SOLN
480.0000 ug | Freq: Every day | INTRAMUSCULAR | Status: DC
  Filled 2023-12-17 (×2): qty 1.6

## 2023-12-17 MED ORDER — VANCOMYCIN HCL 1250 MG/250ML IV SOLN
1250.0000 mg | Freq: Once | INTRAVENOUS | Status: AC
  Administered 2023-12-17: 1250 mg via INTRAVENOUS
  Filled 2023-12-17: qty 250

## 2023-12-17 MED ORDER — ONDANSETRON HCL 4 MG PO TABS: 4.0000 mg | ORAL_TABLET | Freq: Four times a day (QID) | ORAL | Status: DC | PRN

## 2023-12-17 MED ORDER — SODIUM CHLORIDE 0.9 % IV BOLUS
1000.0000 mL | Freq: Once | INTRAVENOUS | Status: AC
  Administered 2023-12-17: 1000 mL via INTRAVENOUS

## 2023-12-17 MED ORDER — VANCOMYCIN HCL 750 MG IV SOLR
750.0000 mg | Freq: Two times a day (BID) | INTRAVENOUS | Status: DC
  Administered 2023-12-18 – 2023-12-19 (×3): 750 mg via INTRAVENOUS
  Filled 2023-12-17 (×3): qty 15

## 2023-12-17 MED ORDER — FLUCONAZOLE IN SODIUM CHLORIDE 400-0.9 MG/200ML-% IV SOLN
400.0000 mg | Freq: Every day | INTRAVENOUS | Status: DC
  Administered 2023-12-17 – 2023-12-20 (×4): 400 mg via INTRAVENOUS
  Filled 2023-12-17 (×5): qty 200

## 2023-12-17 NOTE — Addendum Note (Signed)
Addended by: Gwendel Hanson on: 12/17/2023 01:19 PM   Modules accepted: Orders

## 2023-12-17 NOTE — Progress Notes (Signed)
Pharmacy Antibiotic Note  Tammy Cordova is a 59 y.o. female admitted on 12/17/2023 with  febrile neutropenia .  Pharmacy has been consulted for vancomycin and cefepime dosing. 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 Pt also with severe esophagitis  Plan: Cefepime 2 gr IV q8h  Vancomycin 1250 mg IV x1 then vancomycin 750 mg IV q12h ( AUC 485, SCr 0.76 mg/dl, wt 09.8 kg)  Fluconazole 400 mg IV q24h ( per MD)  Height: 5\' 6"  (167.6 cm) Weight: 62.6 kg (138 lb) IBW/kg (Calculated) : 59.3  Temp (24hrs), Avg:99.4 F (37.4 C), Min:98.1 F (36.7 C), Max:100.7 F (38.2 C)  Recent Labs  Lab 12/17/23 1038 12/17/23 1805  WBC 1.1*  --   CREATININE 0.76  --   LATICACIDVEN  --  1.2    Estimated Creatinine Clearance: 73.5 mL/min (by C-G formula based on SCr of 0.76 mg/dL).    No Known Allergies  Antimicrobials this admission: 1/17 cefepime >>  1/17 vancomycin >>  1/17 fluconazole >>   Dose adjustments this admission:   Microbiology results: 1/17 BCx:    Adalberto Cole, PharmD, BCPS 12/17/2023 7:32 PM

## 2023-12-17 NOTE — Telephone Encounter (Signed)
Dr. Myna Hidalgo notified of Calcium-5.8.  No new orders received at this time.

## 2023-12-17 NOTE — ED Triage Notes (Signed)
Pt discharged 2 days ago, pt cannot eat due to radiation burns to throat. Has become worse since released from hospital. Pt is schedule today for additional radiation, which she did not receive. Pt spitting in emesis bag in triage. Painful swallowing

## 2023-12-17 NOTE — Progress Notes (Addendum)
Patient seen after her recent admission where she started radiation and chemotherapy. She is doing poorly today. She is unable to swallow likely due to esophagitis. Etiology unclear. She is also losing weight, dehydrated and hypocalcemic. She will need to be admitted for symptom management.   Will follow for post discharge needs and office follow up.   Nutrition and social work referral placed per protocol.   Oncology Nurse Navigator Documentation     12/17/2023   11:00 AM  Oncology Nurse Navigator Flowsheets  Navigator Follow Up Date: 12/21/2023  Navigator Follow Up Reason: Appointment Review  Navigator Location [facility]  Navigator Encounter Type Initial MedOnc  Patient Visit Type MedOnc  Treatment Phase Active Tx  Barriers/Navigation Needs Coordination of Care  Interventions Coordination of Care;Psycho-Social Support  Acuity Level 2-Minimal Needs (1-2 Barriers Identified)  Coordination of Care Other  Support Groups/Services Friends and Family  Time Spent with Patient 15

## 2023-12-17 NOTE — ED Provider Triage Note (Signed)
Emergency Medicine Provider Triage Evaluation Note  Tammy Cordova , a 58 y.o. female  was evaluated in triage.  Pt complains of neck pain and difficulty swallowing for three days.  Getting radiation for SCLC. Saw oncologist today who is concern for radiation esophagitis. She did not have radiation today. Oncology plans to consult on her with admission  Labs drawn at oncology significant for hypocalemia (5.8-6 over past 13 days)  Review of Systems  Positive: Neck pain and difficulty swallowing Negative: fevers  Physical Exam  BP (!) 122/56 (BP Location: Left Arm)   Pulse (!) 113   Temp 99.9 F (37.7 C) (Oral)   Resp 20   Ht 5\' 6"  (1.676 m)   Wt 62.6 kg   LMP  (LMP Unknown)   SpO2 95%   BMI 22.27 kg/m  Gen:   Awake, no distress   Resp:  Normal effort  MSK:   Moves extremities without difficulty  Other:    Medical Decision Making  Medically screening exam initiated at 4:00 PM.  Appropriate orders placed.  Tammy Cordova was informed that the remainder of the evaluation will be completed by another provider, this initial triage assessment does not replace that evaluation, and the importance of remaining in the ED until their evaluation is complete.    Judithann Sheen, PA 12/17/23 [phone]

## 2023-12-17 NOTE — H&P (Signed)
History and Physical  Tammy Cordova RUE:[id number] DOB: [birth year] DOA: 12/17/2023  PCP: Ollen Bowl, MD   Chief Complaint: Severe dysphagia, low-grade fever  HPI: Tammy Cordova is a 58 y.o. female with medical history significant for COPD, hypertension, RA, recently diagnosed invasive small cell lung carcinoma on chemotherapy and radiation being admitted to the hospital with failure to thrive, severe dysphagia, and recurrent hypocalcemia.  She was recently hospitalized at the beginning of this month, at which time she was diagnosed with her small cell lung cancer.  She was seen by Dr. Myna Hidalgo during that hospital stay, started chemotherapy and radiation in house, and was discharged home in stable condition on 1/9.  Today was her first office visit with Dr. Myna Hidalgo, after starting carboplatin/etoposide in the hospital.  For the past week, she has been unable to swallow due to severe pain.  Today in the office, she was also noted to be neutropenic, with low-grade fever.  At the request of Dr. Myna Hidalgo, she came to the emergency department and will need to be admitted for management of recurrent severe hypocalcemia, low-grade fever and concern for radiation esophagitis.  Review of Systems: Please see HPI for pertinent positives and negatives. A complete 10 system review of systems are otherwise negative.  Past Medical History:  Diagnosis Date   COPD (chronic obstructive pulmonary disease) (HCC)    Pneumonia    Past Surgical History:  Procedure Laterality Date   BREAST BIOPSY Left 04/2016   REACTIVE LYMPHOID HYPERPLASIA    BUNIONECTOMY     bilateral   IR IMAGING GUIDED PORT INSERTION  12/03/2023   Social History:  reports that she has been smoking cigarettes. She has a 10.8 pack-year smoking history. She has never used smokeless tobacco. She reports current drug use. Drug: Marijuana. She reports that she does not drink alcohol.  No Known Allergies  Family History  Problem Relation  Age of Onset   Breast cancer Paternal Grandmother 77   Diabetes Mother    Hypertension Mother    Diabetes Father    Hypertension Father    Cancer Father      Prior to Admission medications   Medication Sig Start Date End Date Taking? Authorizing Provider  budesonide-formoterol (SYMBICORT) 160-4.5 MCG/ACT inhaler Take 2 puffs first thing in am and then another 2 puffs about 12 hours later. Patient not taking: Reported on 12/02/2023 09/26/20   Nyoka Cowden, MD  calcium-vitamin D (OSCAL WITH D) 500-5 MG-MCG tablet Take 1 tablet by mouth 2 (two) times daily. 12/09/23   Glade Lloyd, MD  dronabinol ([name]) 2.5 MG capsule Take 1 capsule (2.5 mg total) by mouth 2 (two) times daily before lunch and supper. 12/09/23   Glade Lloyd, MD  HYDROcodone-acetaminophen (NORCO/VICODIN) 5-325 MG tablet Take 1 tablet by mouth every 6 (six) hours as needed for moderate pain (pain score 4-6). 12/09/23   Glade Lloyd, MD  meloxicam (MOBIC) 15 MG tablet Take 15 mg by mouth daily. 12/14/23   [provider]  pantoprazole (PROTONIX) 40 MG tablet Take 40 mg by mouth every morning. Patient not taking: Reported on 12/02/2023    [provider]  valsartan (DIOVAN) 320 MG tablet Take 320 mg by mouth daily. Patient not taking: Reported on 12/02/2023    [provider]    Physical Exam: BP (!) 122/56 (BP Location: Left Arm)   Pulse (!) 113   Temp 99.9 F (37.7 C) (Oral)   Resp 20   Ht 5\' 6"  (1.676 m)  Wt 62.6 kg   LMP  (LMP Unknown)   SpO2 95%   BMI 22.27 kg/m  General:  Alert, oriented, calm, in no acute distress, looks tired but nontoxic Cardiovascular: RRR, no murmurs or rubs, no peripheral edema  Respiratory: clear to auscultation bilaterally, no wheezes, no crackles  Abdomen: soft, nontender, nondistended, normal bowel tones heard  Skin: dry, no rashes, she has a left anterior chest port, site is benign, minimally tender, no surrounding erythema, induration or skin  changes Musculoskeletal: no joint effusions, normal range of motion  Psychiatric: appropriate affect, normal speech  Neurologic: extraocular muscles intact, clear speech, moving all extremities with intact sensorium         Labs on Admission:  Basic Metabolic Panel: Recent Labs  Lab 12/17/23 1038  NA 144  K 3.5  CL 109  CO2 24  GLUCOSE 105*  BUN 12  CREATININE 0.76  CALCIUM 5.8*  MG 1.8   Liver Function Tests: Recent Labs  Lab 12/17/23 1038  AST 11*  ALT 9  ALKPHOS 74  BILITOT 0.3  PROT 7.9  ALBUMIN 3.3*   No results for input(s): "LIPASE", "AMYLASE" in the last 168 hours. No results for input(s): "AMMONIA" in the last 168 hours. CBC: Recent Labs  Lab 12/17/23 1038  WBC 1.1*  NEUTROABS 0.5*  HGB 10.1*  HCT 31.8*  MCV 85.0  PLT 184   Cardiac Enzymes: No results for input(s): "CKTOTAL", "CKMB", "CKMBINDEX", "TROPONINI" in the last 168 hours. BNP (last 3 results) Recent Labs    12/02/23 0925  BNP 50.6    ProBNP (last 3 results) No results for input(s): "PROBNP" in the last 8760 hours.  CBG: No results for input(s): "GLUCAP" in the last 168 hours.  Radiological Exams on Admission: No results found.  Assessment/Plan Tammy Cordova is a 58 y.o. female with medical history significant for COPD, hypertension, RA, recently diagnosed invasive small cell lung carcinoma on chemotherapy and radiation being admitted to the hospital with failure to thrive, severe dysphagia, and recurrent hypocalcemia.  Severe esophagitis-likely radiation esophagitis but due to her immunocompromise status and neutropenia, there is concern for possible infectious esophagitis. -Observation admission -Discussed with Dr. Ewing Schlein, who recommends Magic mouthwash, to be followed by barium swallow and formal GI consultation if symptoms persist/worsen -IV fluconazole  Neutropenia-due to recent chemotherapy, with low-grade fever.  ER provider discussed with Dr. Myna Hidalgo, who recommends  coverage with empiric IV antibiotics. -Follow-up blood cultures -Empiric IV vancomycin and IV cefepime -Started on Neulasta by Dr. Myna Hidalgo  Hypocalcemia-required IV calcium replacement during her last hospital stay, and discharged with p.o. replacement.  Small cell lung cancer-under the care of Dr. Myna Hidalgo, currently on chemotherapy and radiation -Dr. Myna Hidalgo following in house -Continue [name] -Norco as needed for pain  DVT prophylaxis: Lovenox     Code Status: Full Code  Consults called: Dr. Myna Hidalgo oncology  Admission status: Observation  Time spent: 49 minutes  Johnjoseph Rolfe Sharlette Dense MD [facility] Pager [phone]  If 7PM-7AM, please contact night-coverage [email or web address] Password [facility]  12/17/2023, 5:53 PM

## 2023-12-17 NOTE — ED Provider Notes (Addendum)
[place] EMERGENCY DEPARTMENT AT [facility] Provider Note   CSN: [id number] Arrival date & time: 12/17/23  1317     History  Chief Complaint  Patient presents with   Sore Throat    Radiation burns to throat    Tammy Cordova is a 58 y.o. female.  58 year old female with history of small cell lung cancer presents due to not be able to keep down fluids.  Patient has been getting radiation treatment and there is concern for radiation esophagitis.  Also could be a fungal infection there as well 2.  Patient states when she tries to swallow that she has severe pain.  Was seen at her oncologist office today and blood work was done there.  Was significant for hypocalcemia of 5.8.  Also she is neutropenic as well to with white count of 1.1.  She herself denies any fevers but has noted profound weakness.  Symptoms been going on for about a week.  Was sent here for hospitalization       Home Medications Prior to Admission medications   Medication Sig Start Date End Date Taking? Authorizing Provider  budesonide-formoterol (SYMBICORT) 160-4.5 MCG/ACT inhaler Take 2 puffs first thing in am and then another 2 puffs about 12 hours later. Patient not taking: Reported on 12/02/2023 09/26/20   Nyoka Cowden, MD  calcium-vitamin D (OSCAL WITH D) 500-5 MG-MCG tablet Take 1 tablet by mouth 2 (two) times daily. 12/09/23   Glade Lloyd, MD  dronabinol ([name]) 2.5 MG capsule Take 1 capsule (2.5 mg total) by mouth 2 (two) times daily before lunch and supper. 12/09/23   Glade Lloyd, MD  HYDROcodone-acetaminophen (NORCO/VICODIN) 5-325 MG tablet Take 1 tablet by mouth every 6 (six) hours as needed for moderate pain (pain score 4-6). 12/09/23   Glade Lloyd, MD  meloxicam (MOBIC) 15 MG tablet Take 15 mg by mouth daily. 12/14/23   [provider]  pantoprazole (PROTONIX) 40 MG tablet Take 40 mg by mouth every morning. Patient not taking: Reported on 12/02/2023    [provider]   valsartan (DIOVAN) 320 MG tablet Take 320 mg by mouth daily. Patient not taking: Reported on 12/02/2023    [provider]      Allergies    Patient has no known allergies.    Review of Systems   Review of Systems  All other systems reviewed and are negative.   Physical Exam Updated Vital Signs BP (!) 122/56 (BP Location: Left Arm)   Pulse (!) 113   Temp 99.9 F (37.7 C) (Oral)   Resp 20   Ht 1.676 m (5\' 6" )   Wt 62.6 kg   LMP  (LMP Unknown)   SpO2 95%   BMI 22.27 kg/m  Physical Exam Vitals and nursing note reviewed.  Constitutional:      General: She is not in acute distress.    Appearance: She is cachectic. She is ill-appearing. She is not toxic-appearing.  HENT:     Head: Normocephalic and atraumatic.  Eyes:     General: Lids are normal.     Conjunctiva/sclera: Conjunctivae normal.     Pupils: Pupils are equal, round, and reactive to light.  Neck:     Thyroid: No thyroid mass.     Trachea: No tracheal deviation.  Cardiovascular:     Rate and Rhythm: Regular rhythm. Tachycardia present.     Heart sounds: Normal heart sounds. No murmur heard.    No gallop.  Pulmonary:  Effort: Pulmonary effort is normal. No respiratory distress.     Breath sounds: Normal breath sounds. No stridor. No decreased breath sounds, wheezing, rhonchi or rales.  Abdominal:     General: There is no distension.     Palpations: Abdomen is soft.     Tenderness: There is no abdominal tenderness. There is no rebound.  Musculoskeletal:        General: No tenderness. Normal range of motion.     Cervical back: Normal range of motion and neck supple.  Skin:    General: Skin is warm and dry.     Findings: No abrasion or rash.  Neurological:     Mental Status: She is alert and oriented to person, place, and time. Mental status is at baseline.     GCS: GCS eye subscore is 4. GCS verbal subscore is 5. GCS motor subscore is 6.     Cranial Nerves: No cranial nerve deficit.     Sensory:  No sensory deficit.     Motor: Motor function is intact.  Psychiatric:        Attention and Perception: Attention normal.        Speech: Speech normal.        Behavior: Behavior normal.     ED Results / Procedures / Treatments   Labs (all labs ordered are listed, but only abnormal results are displayed) Labs Reviewed - No data to display  EKG None  Radiology No results found.  Procedures Procedures    Medications Ordered in ED Medications  sodium chloride 0.9 % bolus 1,000 mL (has no administration in time range)  0.9 %  sodium chloride infusion (has no administration in time range)  calcium gluconate 1 g/ 50 mL sodium chloride IVPB (has no administration in time range)    ED Course/ Medical Decision Making/ A&P                                 Medical Decision Making Amount and/or Complexity of Data Reviewed Radiology: ordered.  Risk Prescription drug management. Decision regarding hospitalization.   Patient will be given IV fluids here.  Will be started on broad-spectrum antibiotics as she does have low-grade temperature 99.9.  Blood cultures and lactic acid added.  Patient also hypoglycemic and given IV calcium.  Concern for possible sepsis.  Will add urine and chest x-ray although she has no complaints of that.  Plan will be to admit to the hospitalist team  CRITICAL CARE Performed by: Toy Baker Total critical care time: 45 minutes Critical care time was exclusive of separately billable procedures and treating other patients. Critical care was necessary to treat or prevent imminent or life-threatening deterioration. Critical care was time spent personally by me on the following activities: development of treatment plan with patient and/or surrogate as well as nursing, discussions with consultants, evaluation of patient's response to treatment, examination of patient, obtaining history from patient or surrogate, ordering and performing treatments and  interventions, ordering and review of laboratory studies, ordering and review of radiographic studies, pulse oximetry and re-evaluation of patient's condition.         Final Clinical Impression(s) / ED Diagnoses Final diagnoses:  None    Rx / DC Orders ED Discharge Orders     None         Lorre Nick, MD 12/17/23 1700    Lorre Nick, MD 12/17/23 1730

## 2023-12-17 NOTE — Progress Notes (Signed)
Hematology and Oncology Follow Up Visit  Tammy Cordova [id number] [birth year] 58 y.o. 12/17/2023   Principle Diagnosis:  Extensive stage small cell lung cancer --TMB=23  Current Therapy:   Status post cycle 1 of carboplatinum/etoposide + XRT start cycle 1 on 12/04/2023     Interim History:  Tammy Cordova is back for her first office visit.  I saw her in the hospital.  She had extensive stage small cell lung cancer.  We had to go ahead and start her treatment.  She is having problems with impending SVC syndrome.  We gave her treatment with carboplatinum/etoposide.  She also started radiotherapy.  She had extensive bulky disease in her chest.  Unfortunately, she clearly has complications from treatment.  She has not been able to swallow for a week.  I suspect she probably has radiation esophagitis.  She also may have viral esophagitis as she is now neutropenic.  She had a PET scan that was done on 11/30/2023.  This showed that she had extensive thoracic disease.  She also had iliac adenopathy.  Thankfully, she does not have CNS disease.  One of the complications that she has had while in the hospital was profound hypocalcemia.  She clearly do not be admitted.  She is dehydrated.  She is not able to swallow.  She is losing weight.  I just hate that she has this complication.  She has not had no obvious fever.  There has been no bleeding.  She has had no diarrhea.  She has had no headache.  Overall, I would say performance status for now is probably ECOG 1.  Medications:  Current Outpatient Medications:    meloxicam (MOBIC) 15 MG tablet, Take 15 mg by mouth daily., Disp: , Rfl:    budesonide-formoterol (SYMBICORT) 160-4.5 MCG/ACT inhaler, Take 2 puffs first thing in am and then another 2 puffs about 12 hours later. (Patient not taking: Reported on 12/02/2023), Disp: 1 each, Rfl: 11   calcium-vitamin D (OSCAL WITH D) 500-5 MG-MCG tablet, Take 1 tablet by mouth 2 (two) times daily., Disp:  60 tablet, Rfl: 0   dronabinol ([name]) 2.5 MG capsule, Take 1 capsule (2.5 mg total) by mouth 2 (two) times daily before lunch and supper., Disp: 14 capsule, Rfl: 0   HYDROcodone-acetaminophen (NORCO/VICODIN) 5-325 MG tablet, Take 1 tablet by mouth every 6 (six) hours as needed for moderate pain (pain score 4-6)., Disp: 20 tablet, Rfl: 0   pantoprazole (PROTONIX) 40 MG tablet, Take 40 mg by mouth every morning. (Patient not taking: Reported on 12/02/2023), Disp: , Rfl:    valsartan (DIOVAN) 320 MG tablet, Take 320 mg by mouth daily. (Patient not taking: Reported on 12/02/2023), Disp: , Rfl:   Allergies: No Known Allergies  Past Medical History, Surgical history, Social history, and Family History were reviewed and updated.  Review of Systems: Review of Systems  Constitutional:  Positive for appetite change and unexpected weight change.  HENT:   Positive for sore throat.   Eyes: Negative.   Respiratory: Negative.    Cardiovascular: Negative.   Gastrointestinal:  Positive for nausea.  Endocrine: Negative.   Genitourinary: Negative.    Musculoskeletal: Negative.   Skin: Negative.   Neurological:  Positive for dizziness.  Hematological: Negative.   Psychiatric/Behavioral: Negative.      Physical Exam:  height is 5\' 6"  (1.676 m) and weight is 138 lb (62.6 kg). Her temperature is 99.2 F (37.3 C). Her blood pressure is 103/75 and her pulse is 110 (abnormal). Her  respiration is 19 and oxygen saturation is 94%.   Wt Readings from Last 3 Encounters:  12/17/23 138 lb (62.6 kg)  12/02/23 155 lb (70.3 kg)  11/25/23 155 lb (70.3 kg)    Physical Exam Vitals reviewed.  HENT:     Head: Normocephalic and atraumatic.  Eyes:     Pupils: Pupils are equal, round, and reactive to light.  Cardiovascular:     Rate and Rhythm: Normal rate and regular rhythm.     Heart sounds: Normal heart sounds.  Pulmonary:     Effort: Pulmonary effort is normal.     Breath sounds: Normal breath sounds.   Abdominal:     General: Bowel sounds are normal.     Palpations: Abdomen is soft.  Musculoskeletal:        General: No tenderness or deformity. Normal range of motion.     Cervical back: Normal range of motion.  Lymphadenopathy:     Cervical: No cervical adenopathy.  Skin:    General: Skin is warm and dry.     Findings: No erythema or rash.  Neurological:     Mental Status: She is alert and oriented to person, place, and time.  Psychiatric:        Behavior: Behavior normal.        Thought Content: Thought content normal.        Judgment: Judgment normal.      Lab Results  Component Value Date   WBC 1.1 (L) 12/17/2023   HGB 10.1 (L) 12/17/2023   HCT 31.8 (L) 12/17/2023   MCV 85.0 12/17/2023   PLT 184 12/17/2023     Chemistry      Component Value Date/Time   NA 144 12/17/2023 1038   K 3.5 12/17/2023 1038   CL 109 12/17/2023 1038   CO2 24 12/17/2023 1038   BUN 12 12/17/2023 1038   CREATININE 0.76 12/17/2023 1038      Component Value Date/Time   CALCIUM 5.8 (LL) 12/17/2023 1038   ALKPHOS 74 12/17/2023 1038   AST 11 (L) 12/17/2023 1038   ALT 9 12/17/2023 1038   BILITOT 0.3 12/17/2023 1038       Impression and Plan: Tammy Cordova is a very charming 58 year old Afro-American female with extensive stage small cell lung cancer.  She has not had to be admitted.  Again she is not able to swallow.  I suspect she may need to have an upper endoscopy to see what is going on.  Again I worry about radiation esophagitis.  She also may have herpetic esophagitis or Candida esophagitis given that she is neutropenic.  She definitely needs to be hydrated.  Her calcium is also incredibly low.  I am not sure why she has such a low calcium.  Maybe then, while she is in the hospital, we can get Nephrology to look at her to see if they be able to help with this profound hypocalcemia.  With hydration, she may need to be transfused.  I just really hate this for her.  I think she is  responding.  I cannot palpate any right supraclavicular adenopathy now.  We will plan to see her in the hospital.  I am not sure if she will be able to have her radiotherapy today.   [name], MD 1/17/202512:51 PM

## 2023-12-18 DIAGNOSIS — E876 Hypokalemia: Secondary | ICD-10-CM | POA: Diagnosis not present

## 2023-12-18 DIAGNOSIS — D701 Agranulocytosis secondary to cancer chemotherapy: Secondary | ICD-10-CM | POA: Diagnosis present

## 2023-12-18 DIAGNOSIS — R627 Adult failure to thrive: Secondary | ICD-10-CM | POA: Diagnosis present

## 2023-12-18 DIAGNOSIS — E440 Moderate protein-calorie malnutrition: Secondary | ICD-10-CM | POA: Diagnosis present

## 2023-12-18 DIAGNOSIS — T451X5A Adverse effect of antineoplastic and immunosuppressive drugs, initial encounter: Secondary | ICD-10-CM | POA: Diagnosis present

## 2023-12-18 DIAGNOSIS — Z8249 Family history of ischemic heart disease and other diseases of the circulatory system: Secondary | ICD-10-CM | POA: Diagnosis not present

## 2023-12-18 DIAGNOSIS — D709 Neutropenia, unspecified: Secondary | ICD-10-CM | POA: Diagnosis not present

## 2023-12-18 DIAGNOSIS — I10 Essential (primary) hypertension: Secondary | ICD-10-CM | POA: Diagnosis present

## 2023-12-18 DIAGNOSIS — Y842 Radiological procedure and radiotherapy as the cause of abnormal reaction of the patient, or of later complication, without mention of misadventure at the time of the procedure: Secondary | ICD-10-CM | POA: Diagnosis present

## 2023-12-18 DIAGNOSIS — K208 Other esophagitis without bleeding: Secondary | ICD-10-CM | POA: Diagnosis present

## 2023-12-18 DIAGNOSIS — Z95828 Presence of other vascular implants and grafts: Secondary | ICD-10-CM | POA: Diagnosis not present

## 2023-12-18 DIAGNOSIS — K209 Esophagitis, unspecified without bleeding: Secondary | ICD-10-CM | POA: Diagnosis not present

## 2023-12-18 DIAGNOSIS — Z79899 Other long term (current) drug therapy: Secondary | ICD-10-CM | POA: Diagnosis not present

## 2023-12-18 DIAGNOSIS — D509 Iron deficiency anemia, unspecified: Secondary | ICD-10-CM | POA: Diagnosis present

## 2023-12-18 DIAGNOSIS — R5081 Fever presenting with conditions classified elsewhere: Secondary | ICD-10-CM | POA: Diagnosis present

## 2023-12-18 DIAGNOSIS — Z7951 Long term (current) use of inhaled steroids: Secondary | ICD-10-CM | POA: Diagnosis not present

## 2023-12-18 DIAGNOSIS — E611 Iron deficiency: Secondary | ICD-10-CM

## 2023-12-18 DIAGNOSIS — F17210 Nicotine dependence, cigarettes, uncomplicated: Secondary | ICD-10-CM | POA: Diagnosis present

## 2023-12-18 DIAGNOSIS — J449 Chronic obstructive pulmonary disease, unspecified: Secondary | ICD-10-CM | POA: Diagnosis present

## 2023-12-18 DIAGNOSIS — R1310 Dysphagia, unspecified: Secondary | ICD-10-CM | POA: Diagnosis present

## 2023-12-18 DIAGNOSIS — C3491 Malignant neoplasm of unspecified part of right bronchus or lung: Secondary | ICD-10-CM | POA: Diagnosis present

## 2023-12-18 DIAGNOSIS — D630 Anemia in neoplastic disease: Secondary | ICD-10-CM | POA: Diagnosis present

## 2023-12-18 DIAGNOSIS — E538 Deficiency of other specified B group vitamins: Secondary | ICD-10-CM | POA: Diagnosis present

## 2023-12-18 DIAGNOSIS — Z791 Long term (current) use of non-steroidal anti-inflammatories (NSAID): Secondary | ICD-10-CM | POA: Diagnosis not present

## 2023-12-18 DIAGNOSIS — L89152 Pressure ulcer of sacral region, stage 2: Secondary | ICD-10-CM | POA: Diagnosis present

## 2023-12-18 DIAGNOSIS — Z833 Family history of diabetes mellitus: Secondary | ICD-10-CM | POA: Diagnosis not present

## 2023-12-18 DIAGNOSIS — L89892 Pressure ulcer of other site, stage 2: Secondary | ICD-10-CM | POA: Diagnosis present

## 2023-12-18 LAB — COMPREHENSIVE METABOLIC PANEL
ALT: 11 U/L (ref 0–44)
AST: 13 U/L — ABNORMAL LOW (ref 15–41)
Albumin: 2 g/dL — ABNORMAL LOW (ref 3.5–5.0)
Alkaline Phosphatase: 55 U/L (ref 38–126)
Anion gap: 6 (ref 5–15)
BUN: 14 mg/dL (ref 6–20)
CO2: 23 mmol/L (ref 22–32)
Calcium: 5.3 mg/dL — CL (ref 8.9–10.3)
Chloride: 116 mmol/L — ABNORMAL HIGH (ref 98–111)
Creatinine, Ser: 0.68 mg/dL (ref 0.44–1.00)
GFR, Estimated: >60 mL/min (ref >60)
Glucose, Bld: 93 mg/dL (ref 70–99)
Potassium: 2.9 mmol/L — ABNORMAL LOW (ref 3.5–5.1)
Sodium: 145 mmol/L (ref 135–145)
Total Bilirubin: 0.4 mg/dL (ref 0.0–1.2)
Total Protein: 6.5 g/dL (ref 6.5–8.1)

## 2023-12-18 LAB — URINALYSIS, W/ REFLEX TO CULTURE (INFECTION SUSPECTED)
Bilirubin Urine: NEGATIVE
Glucose, UA: NEGATIVE mg/dL
Hgb urine dipstick: NEGATIVE
Ketones, ur: 5 mg/dL — AB
Leukocytes,Ua: NEGATIVE
Nitrite: NEGATIVE
Protein, ur: 100 mg/dL — AB
Specific Gravity, Urine: 1.027 (ref 1.005–1.030)
pH: 5 (ref 5.0–8.0)

## 2023-12-18 LAB — CBC WITH DIFFERENTIAL/PLATELET
Abs Immature Granulocytes: 0.02 10*3/uL (ref 0.00–0.07)
Basophils Absolute: 0 10*3/uL (ref 0.0–0.1)
Basophils Relative: 1 %
Eosinophils Absolute: 0 10*3/uL (ref 0.0–0.5)
Eosinophils Relative: 0 %
HCT: 28.6 % — ABNORMAL LOW (ref 36.0–46.0)
Hemoglobin: 8.8 g/dL — ABNORMAL LOW (ref 12.0–15.0)
Immature Granulocytes: 1 %
Lymphocytes Relative: 14 %
Lymphs Abs: 0.5 10*3/uL — ABNORMAL LOW (ref 0.7–4.0)
MCH: 27.2 pg (ref 26.0–34.0)
MCHC: 30.8 g/dL (ref 30.0–36.0)
MCV: 88.5 fL (ref 80.0–100.0)
Monocytes Absolute: 0.4 10*3/uL (ref 0.1–1.0)
Monocytes Relative: 11 %
Neutro Abs: 2.6 10*3/uL (ref 1.7–7.7)
Neutrophils Relative %: 73 %
Platelets: 181 10*3/uL (ref 150–400)
RBC: 3.23 MIL/uL — ABNORMAL LOW (ref 3.87–5.11)
RDW: 15.4 % (ref 11.5–15.5)
WBC: 3.5 10*3/uL — ABNORMAL LOW (ref 4.0–10.5)
nRBC: 0 % (ref 0.0–0.2)

## 2023-12-18 LAB — BASIC METABOLIC PANEL
Anion gap: 7 (ref 5–15)
BUN: 9 mg/dL (ref 6–20)
CO2: 20 mmol/L — ABNORMAL LOW (ref 22–32)
Calcium: 6.2 mg/dL — CL (ref 8.9–10.3)
Chloride: 118 mmol/L — ABNORMAL HIGH (ref 98–111)
Creatinine, Ser: 0.69 mg/dL (ref 0.44–1.00)
GFR, Estimated: >60 mL/min (ref >60)
Glucose, Bld: 85 mg/dL (ref 70–99)
Potassium: 4.2 mmol/L (ref 3.5–5.1)
Sodium: 145 mmol/L (ref 135–145)

## 2023-12-18 LAB — VITAMIN B12: Vitamin B-12: 404 pg/mL (ref 180–914)

## 2023-12-18 LAB — TSH: TSH: 0.163 u[IU]/mL — ABNORMAL LOW (ref 0.350–4.500)

## 2023-12-18 MED ORDER — SODIUM CHLORIDE 0.9 % IV SOLN
4.0000 g | Freq: Once | INTRAVENOUS | Status: AC
  Administered 2023-12-18: 4 g via INTRAVENOUS
  Filled 2023-12-18: qty 40

## 2023-12-18 MED ORDER — POTASSIUM CHLORIDE 10 MEQ/100ML IV SOLN
10.0000 meq | INTRAVENOUS | Status: AC
  Administered 2023-12-18 (×6): 10 meq via INTRAVENOUS
  Filled 2023-12-18 (×5): qty 100

## 2023-12-18 MED ORDER — HYDRALAZINE HCL 20 MG/ML IJ SOLN: 10.0000 mg | INTRAMUSCULAR | Status: DC | PRN

## 2023-12-18 MED ORDER — POTASSIUM PHOSPHATES 15 MMOLE/5ML IV SOLN
30.0000 mmol | Freq: Once | INTRAVENOUS | Status: AC
  Administered 2023-12-18: 30 mmol via INTRAVENOUS
  Filled 2023-12-18: qty 10

## 2023-12-18 MED ORDER — SODIUM CHLORIDE 0.9 % IV SOLN
INTRAVENOUS | Status: AC
Start: 2023-12-18 — End: 2023-12-19

## 2023-12-18 MED ORDER — METOPROLOL TARTRATE 5 MG/5ML IV SOLN: 5.0000 mg | INTRAVENOUS | Status: DC | PRN

## 2023-12-18 MED ORDER — MAGNESIUM SULFATE 2 GM/50ML IV SOLN
2.0000 g | Freq: Once | INTRAVENOUS | Status: AC
  Administered 2023-12-18: 2 g via INTRAVENOUS
  Filled 2023-12-18: qty 50

## 2023-12-18 MED ORDER — POTASSIUM PHOSPHATES 15 MMOLE/5ML IV SOLN: 30.0000 mmol | Freq: Once | INTRAVENOUS | Status: DC

## 2023-12-18 MED ORDER — IPRATROPIUM-ALBUTEROL 0.5-2.5 (3) MG/3ML IN SOLN: 3.0000 mL | RESPIRATORY_TRACT | Status: DC | PRN

## 2023-12-18 MED ORDER — GUAIFENESIN 100 MG/5ML PO LIQD: 5.0000 mL | ORAL | Status: DC | PRN

## 2023-12-18 MED ORDER — SODIUM CHLORIDE 0.9 % IV SOLN
12.5000 mg | Freq: Four times a day (QID) | INTRAVENOUS | Status: DC | PRN
  Administered 2023-12-18: 12.5 mg via INTRAVENOUS
  Filled 2023-12-18: qty 12.5

## 2023-12-18 MED ORDER — SENNOSIDES-DOCUSATE SODIUM 8.6-50 MG PO TABS: 1.0000 | ORAL_TABLET | Freq: Every evening | ORAL | Status: DC | PRN

## 2023-12-18 MED ORDER — PANTOPRAZOLE SODIUM 40 MG PO TBEC
40.0000 mg | DELAYED_RELEASE_TABLET | Freq: Two times a day (BID) | ORAL | Status: DC
  Administered 2023-12-18 – 2023-12-24 (×13): 40 mg via ORAL
  Filled 2023-12-18 (×13): qty 1

## 2023-12-18 MED ORDER — CHLORHEXIDINE GLUCONATE CLOTH 2 % EX PADS
6.0000 | MEDICATED_PAD | Freq: Every day | CUTANEOUS | Status: DC
  Administered 2023-12-19 – 2023-12-24 (×6): 6 via TOPICAL

## 2023-12-18 MED ORDER — SODIUM CHLORIDE 0.9 % IV SOLN
100.0000 mg | Freq: Every day | INTRAVENOUS | Status: AC
  Administered 2023-12-18 – 2023-12-21 (×4): 100 mg via INTRAVENOUS
  Filled 2023-12-18 (×3): qty 5
  Filled 2023-12-18: qty 100

## 2023-12-18 NOTE — Progress Notes (Signed)
PT Cancellation Note  Patient Details Name: RICHEL VIVENZIO MRN: [id number] DOB: [birth year]    Cancelled Treatment:    Reason Eval/Treat Not Completed: Patient declined, no reason specified Pt reports being too fatigued to participate.   Janan Halter [place] 12/18/2023, 3:14 PM Paulino Door, DPT Physical Therapist Acute Rehabilitation Services Office: [phone]

## 2023-12-18 NOTE — Progress Notes (Signed)
PROGRESS NOTE    Tammy Cordova  ZOX:[id number] DOB: 1966/09/22 DOA: 12/17/2023 PCP: Ollen Bowl, MD    Brief Narrative:  58 year old with history of COPD, HTN, RA, recently diagnosed with invasive small cell carcinoma on chemotherapy and radiation admitted to the hospital for severe dysphagia, failure to thrive and hypocalcemia.  Oncology team has been consulted.   Assessment & Plan:  Principal Problem:   Esophagitis, acute    Severe esophagitis, likely radiation-induced - Admitting provider discussed case with [facility] who recommended Magic mouthwash and barium swallow.  If necessary they will be available.  Continue IV fluconazole. -PPI   Neutropenic fever - Currently on empiric coverage with vancomycin and cefepime.  Follow culture data.  Given Neupogen   Hypocalcemia/ HypoK IV calcium   Small cell lung cancer -Management per oncology team  Failure to thrive - Will consult dietitian  Anemia of chronic disease, iron deficiency - Will give IV iron   DVT prophylaxis: enoxaparin (LOVENOX) injection 40 mg Start: 12/17/23 2200 SCDs Start: 12/17/23 1734      Code Status: Full Code Family Communication:   Severe electrolyte abnormality, continue hospital stay    Subjective: Seen at bedside, overall appears weak.  No other complaints at this time.   Examination:  General exam: Appears calm and comfortable  Respiratory system: Clear to auscultation. Respiratory effort normal. Cardiovascular system: S1 & S2 heard, RRR. No JVD, murmurs, rubs, gallops or clicks. No pedal edema. Gastrointestinal system: Abdomen is nondistended, soft and nontender. No organomegaly or masses felt. Normal bowel sounds heard. Central nervous system: Alert and oriented. No focal neurological deficits. Extremities: Symmetric 5 x 5 power. Skin: No rashes, lesions or ulcers Psychiatry: Judgement and insight appear normal. Mood & affect appropriate. Chemo-Port in place                Diet Orders (From admission, onward)     Start     Ordered   12/17/23 1734  Diet regular Room service appropriate? Yes; Fluid consistency: Thin  Diet effective now       Question Answer Comment  Room service appropriate? Yes   Fluid consistency: Thin      12/17/23 1744            Objective: Vitals:   12/17/23 1836 12/17/23 2229 12/18/23 0158 12/18/23 0557  BP: (!) 129/58 (!) 124/90 120/67 120/70  Pulse: (!) 106 98 92 91  Resp: 20 16 16 16   Temp: 98.1 F (36.7 C) 99.6 F (37.6 C) 98.7 F (37.1 C) 98.2 F (36.8 C)  TempSrc: Oral Oral Oral Oral  SpO2: 91% 90% 92% 92%  Weight:      Height:        Intake/Output Summary (Last 24 hours) at 12/18/2023 1205 Last data filed at 12/18/2023 0520 Gross per 24 hour  Intake 1649.87 ml  Output --  Net 1649.87 ml   Filed Weights   12/17/23 1521  Weight: 62.6 kg    Scheduled Meds:  dronabinol  2.5 mg Oral BID AC   enoxaparin (LOVENOX) injection  40 mg Subcutaneous Q24H   pantoprazole  40 mg Oral BID AC   Continuous Infusions:  sodium chloride 125 mL/hr at 12/18/23 0520   sodium chloride 75 mL/hr at 12/18/23 0910   ceFEPime (MAXIPIME) IV 2 g (12/18/23 0929)   fluconazole (DIFLUCAN) IV 400 mg (12/18/23 0917)   iron sucrose 100 mg (12/18/23 0946)   magnesium sulfate bolus IVPB 2 g (12/18/23 1148)   potassium chloride  10 mEq (12/18/23 1150)   potassium PHOSPHATE 30 mmol in dextrose 5 % 250 mL infusion 30 mmol (12/18/23 0906)   promethazine (PHENERGAN) injection (IM or IVPB)     vancomycin (VANCOCIN) 750 mg in sodium chloride 0.9 % 250 mL IVPB 250 mL/hr at 12/18/23 0520    Nutritional status     Body mass index is 22.27 kg/m.  Data Reviewed:   CBC: Recent Labs  Lab 12/17/23 1038 12/18/23 0508  WBC 1.1* 3.5*  NEUTROABS 0.5* 2.6  HGB 10.1* 8.8*  HCT 31.8* 28.6*  MCV 85.0 88.5  PLT 184 181   Basic Metabolic Panel: Recent Labs  Lab 12/17/23 1038 12/18/23 0700  NA 144 145  K 3.5  2.9*  CL 109 116*  CO2 24 23  GLUCOSE 105* 93  BUN 12 14  CREATININE 0.76 0.68  CALCIUM 5.8* 5.3*  MG 1.8  --    GFR: Estimated Creatinine Clearance: 73.5 mL/min (by C-G formula based on SCr of 0.68 mg/dL). Liver Function Tests: Recent Labs  Lab 12/17/23 1038 12/18/23 0700  AST 11* 13*  ALT 9 11  ALKPHOS 74 55  BILITOT 0.3 0.4  PROT 7.9 6.5  ALBUMIN 3.3* 2.0*   No results for input(s): "LIPASE", "AMYLASE" in the last 168 hours. No results for input(s): "AMMONIA" in the last 168 hours. Coagulation Profile: No results for input(s): "INR", "PROTIME" in the last 168 hours. Cardiac Enzymes: No results for input(s): "CKTOTAL", "CKMB", "CKMBINDEX", "TROPONINI" in the last 168 hours. BNP (last 3 results) No results for input(s): "PROBNP" in the last 8760 hours. HbA1C: No results for input(s): "HGBA1C" in the last 72 hours. CBG: No results for input(s): "GLUCAP" in the last 168 hours. Lipid Profile: No results for input(s): "CHOL", "HDL", "LDLCALC", "TRIG", "CHOLHDL", "LDLDIRECT" in the last 72 hours. Thyroid Function Tests: No results for input(s): "TSH", "T4TOTAL", "FREET4", "T3FREE", "THYROIDAB" in the last 72 hours. Anemia Panel: Recent Labs    12/17/23 1038 12/17/23 1039  FERRITIN  --  409*  TIBC 164*  --   IRON 14*  --    Sepsis Labs: Recent Labs  Lab 12/17/23 1805  LATICACIDVEN 1.2    Recent Results (from the past 240 hours)  Culture, blood (Routine X 2) w Reflex to ID Panel     Status: None (Preliminary result)   Collection Time: 12/17/23  5:40 PM   Specimen: BLOOD  Result Value Ref Range Status   Specimen Description   Final    BLOOD SITE NOT SPECIFIED Performed at [facility], [address], [place], [place] [address]    Special Requests   Final    BOTTLES DRAWN AEROBIC AND ANAEROBIC Blood Culture adequate volume Performed at [facility], [address], [place], [place] [address]    Culture   Final    NO  GROWTH < 24 HOURS Performed at [facility], [address], [place], [place] [address]    Report Status PENDING  Incomplete  Culture, blood (Routine X 2) w Reflex to ID Panel     Status: None (Preliminary result)   Collection Time: 12/17/23  6:51 PM   Specimen: BLOOD  Result Value Ref Range Status   Specimen Description   Final    BLOOD BLOOD LEFT HAND Performed at [facility], [address] [place], [place] [address]    Special Requests   Final    BOTTLES DRAWN AEROBIC ONLY Blood Culture results may not be optimal  due to an inadequate volume of blood received in culture bottles Performed at [facility], [address], [place], [place] [address]    Culture   Final    NO GROWTH < 12 HOURS Performed at [facility], [address], [place], [place] [address]    Report Status PENDING  Incomplete         Radiology Studies: DG Chest Port 1 View Result Date: 12/17/2023 CLINICAL DATA:  Cough.  History of non-small cell lung cancer. EXAM: PORTABLE CHEST 1 VIEW COMPARISON:  12/02/2023 FINDINGS: Left chest wall port a catheter is identified with tip at the superior cavoatrial junction. Stable cardiomediastinal contours. Chronic interstitial coarsening compatible with emphysema. Mild diffuse increase interstitial prominence is noted bilaterally. Increased right paratracheal opacity is identified over the medial right apex compatible with patient's known malignancy. The visualized osseous structures are unremarkable. IMPRESSION: 1. Mild diffuse increase interstitial prominence bilaterally. Differential considerations include mild pulmonary edema versus atypical infection. 2. Increased right paratracheal opacity compatible with patient's known malignancy. 3. Chronic changes of COPD/emphysema. Electronically Signed   By: Signa Kell M.D.   On: 12/17/2023 18:22           LOS: 0 days   Time spent= 35 mins    Miguel Rota,  MD [facility]  If 7PM-7AM, please contact night-coverage  12/18/2023, 12:05 PM

## 2023-12-18 NOTE — Hospital Course (Addendum)
Brief Narrative:  58 year old with history of COPD, HTN, RA, recently diagnosed with invasive small cell carcinoma on chemotherapy and radiation admitted to the hospital for severe dysphagia, failure to thrive and hypocalcemia.  Oncology team has been consulted.   Assessment & Plan:  Principal Problem:   Esophagitis, acute    Severe esophagitis, likely radiation-induced - Admitting provider discussed case with [facility] who recommended Magic mouthwash and barium swallow.  If necessary they will be available.  Continue IV fluconazole. -PPI   Neutropenic fever - Currently on empiric coverage with vancomycin and cefepime.  Follow culture data.  Given Neupogen   Hypocalcemia/ HypoK IV calcium   Small cell lung cancer -Management per oncology team  Failure to thrive - Will consult dietitian  Anemia of chronic disease, iron deficiency - Will give IV iron   DVT prophylaxis: enoxaparin (LOVENOX) injection 40 mg Start: 12/17/23 2200 SCDs Start: 12/17/23 1734      Code Status: Full Code Family Communication:   Severe electrolyte abnormality, continue hospital stay    Subjective: Seen at bedside, overall appears weak.  No other complaints at this time.   Examination:  General exam: Appears calm and comfortable  Respiratory system: Clear to auscultation. Respiratory effort normal. Cardiovascular system: S1 & S2 heard, RRR. No JVD, murmurs, rubs, gallops or clicks. No pedal edema. Gastrointestinal system: Abdomen is nondistended, soft and nontender. No organomegaly or masses felt. Normal bowel sounds heard. Central nervous system: Alert and oriented. No focal neurological deficits. Extremities: Symmetric 5 x 5 power. Skin: No rashes, lesions or ulcers Psychiatry: Judgement and insight appear normal. Mood & affect appropriate. Chemo-Port in place

## 2023-12-18 NOTE — Plan of Care (Signed)

## 2023-12-18 NOTE — Progress Notes (Signed)
Ms. Tammy Cordova is feeling better.  She was admitted from the office yesterday.  She has what I suspect is radiation esophagitis.  She also had neutropenia..  She feels better.  She is able to swallow a little bit better now.  Hopefully, we can hold off on any endoscopic evaluation.  She did get a dose of Neupogen.  Her white cell count went up to 3.5.  Hemoglobin 8.8.  Platelet count 181,000.  ANC is 2.6.  I think we can probably stop the Neupogen.  She is still markedly iron deficient.  I will give her some IV iron.  She still has a hypocalcemia.  I still cannot figure out why she has so significant hypocalcemia.  Her metabolic panel is not yet back..  She continues on broad-spectrum antibiotics.  I will continue this for right now.   Her temperatures 98.2.  Pulse 91.  Blood pressure 120/70.  Her head and neck exam shows no ocular or oral lesions.  She has no mucositis.  There is no obvious thrush.  Her lungs are clear bilaterally.  Cardiac exam regular rate and rhythm.  Abdomen is soft.  Bowel sounds are present.  There is no fluid wave.  There is no palpable liver or spleen tip.  Extremities shows no clubbing, cyanosis or edema.  Neurological exam was nonfocal.  I think that the hydration certainly has been a huge benefit for Ms. Co.  In addition, getting her white cell count up is also been a plus.  Again we really have to get this hypocalcemia figured out.  I do not know if nephrology does this.  I would think that we would be able to restart her on the radiation next week.  I would continue her antibiotics for right now.  We will see what her cultures have to show.  I know that she will get incredible care from everybody up on [address].  Christin Bach, MD  Psalm 6:2

## 2023-12-19 DIAGNOSIS — K209 Esophagitis, unspecified without bleeding: Secondary | ICD-10-CM | POA: Diagnosis not present

## 2023-12-19 LAB — COMPREHENSIVE METABOLIC PANEL
ALT: 11 U/L (ref 0–44)
AST: 13 U/L — ABNORMAL LOW (ref 15–41)
Albumin: 2 g/dL — ABNORMAL LOW (ref 3.5–5.0)
Alkaline Phosphatase: 59 U/L (ref 38–126)
Anion gap: 5 (ref 5–15)
BUN: 8 mg/dL (ref 6–20)
CO2: 21 mmol/L — ABNORMAL LOW (ref 22–32)
Calcium: 7.1 mg/dL — ABNORMAL LOW (ref 8.9–10.3)
Chloride: 116 mmol/L — ABNORMAL HIGH (ref 98–111)
Creatinine, Ser: 0.77 mg/dL (ref 0.44–1.00)
GFR, Estimated: >60 mL/min (ref >60)
Glucose, Bld: 87 mg/dL (ref 70–99)
Potassium: 3.8 mmol/L (ref 3.5–5.1)
Sodium: 142 mmol/L (ref 135–145)
Total Bilirubin: 0.3 mg/dL (ref 0.0–1.2)
Total Protein: 6.4 g/dL — ABNORMAL LOW (ref 6.5–8.1)

## 2023-12-19 LAB — CBC WITH DIFFERENTIAL/PLATELET
Abs Immature Granulocytes: 0.61 10*3/uL — ABNORMAL HIGH (ref 0.00–0.07)
Basophils Absolute: 0.1 10*3/uL (ref 0.0–0.1)
Basophils Relative: 1 %
Eosinophils Absolute: 0 10*3/uL (ref 0.0–0.5)
Eosinophils Relative: 0 %
HCT: 28 % — ABNORMAL LOW (ref 36.0–46.0)
Hemoglobin: 8.5 g/dL — ABNORMAL LOW (ref 12.0–15.0)
Immature Granulocytes: 9 %
Lymphocytes Relative: 12 %
Lymphs Abs: 0.9 10*3/uL (ref 0.7–4.0)
MCH: 27.2 pg (ref 26.0–34.0)
MCHC: 30.4 g/dL (ref 30.0–36.0)
MCV: 89.7 fL (ref 80.0–100.0)
Monocytes Absolute: 0.5 10*3/uL (ref 0.1–1.0)
Monocytes Relative: 7 %
Neutro Abs: 5 10*3/uL (ref 1.7–7.7)
Neutrophils Relative %: 71 %
Platelets: 235 10*3/uL (ref 150–400)
RBC: 3.12 MIL/uL — ABNORMAL LOW (ref 3.87–5.11)
RDW: 16.1 % — ABNORMAL HIGH (ref 11.5–15.5)
WBC: 7 10*3/uL (ref 4.0–10.5)
nRBC: 0.6 % — ABNORMAL HIGH (ref 0.0–0.2)

## 2023-12-19 LAB — PHOSPHORUS: Phosphorus: 2.6 mg/dL (ref 2.5–4.6)

## 2023-12-19 LAB — VITAMIN D 25 HYDROXY (VIT D DEFICIENCY, FRACTURES): Vit D, 25-Hydroxy: 10.68 ng/mL — ABNORMAL LOW (ref 30–100)

## 2023-12-19 LAB — FOLATE: Folate: 5.8 ng/mL — ABNORMAL LOW (ref >5.9)

## 2023-12-19 LAB — MAGNESIUM: Magnesium: 1.8 mg/dL (ref 1.7–2.4)

## 2023-12-19 MED ORDER — IPRATROPIUM-ALBUTEROL 0.5-2.5 (3) MG/3ML IN SOLN
3.0000 mL | Freq: Two times a day (BID) | RESPIRATORY_TRACT | Status: DC
  Administered 2023-12-19 – 2023-12-23 (×9): 3 mL via RESPIRATORY_TRACT
  Filled 2023-12-19 (×10): qty 3

## 2023-12-19 MED ORDER — ORAL CARE MOUTH RINSE: 15.0000 mL | OROMUCOSAL | Status: DC | PRN

## 2023-12-19 MED ORDER — MAGIC MOUTHWASH W/LIDOCAINE
15.0000 mL | Freq: Four times a day (QID) | ORAL | Status: DC
Start: 2023-12-19 — End: 2023-12-24
  Administered 2023-12-19: 15 mL via ORAL
  Administered 2023-12-19: 10 mL via ORAL
  Administered 2023-12-19 – 2023-12-24 (×18): 15 mL via ORAL
  Filled 2023-12-19 (×23): qty 15

## 2023-12-19 MED ORDER — FUROSEMIDE 10 MG/ML IJ SOLN
20.0000 mg | Freq: Once | INTRAMUSCULAR | Status: AC
  Administered 2023-12-19: 20 mg via INTRAVENOUS
  Filled 2023-12-19: qty 2

## 2023-12-19 MED ORDER — VITAMIN D 25 MCG (1000 UNIT) PO TABS
1000.0000 [IU] | ORAL_TABLET | Freq: Every day | ORAL | Status: DC
  Administered 2023-12-19: 1000 [IU] via ORAL
  Filled 2023-12-19: qty 1

## 2023-12-19 MED ORDER — ENSURE ENLIVE PO LIQD
237.0000 mL | Freq: Two times a day (BID) | ORAL | Status: DC
  Administered 2023-12-20: 237 mL via ORAL

## 2023-12-19 MED ORDER — BUDESONIDE 0.5 MG/2ML IN SUSP
0.5000 mg | Freq: Two times a day (BID) | RESPIRATORY_TRACT | Status: DC
  Administered 2023-12-19 – 2023-12-24 (×10): 0.5 mg via RESPIRATORY_TRACT
  Filled 2023-12-19 (×11): qty 2

## 2023-12-19 MED ORDER — FOLIC ACID 1 MG PO TABS
1.0000 mg | ORAL_TABLET | Freq: Every day | ORAL | Status: DC
  Administered 2023-12-19 – 2023-12-24 (×6): 1 mg via ORAL
  Filled 2023-12-19 (×6): qty 1

## 2023-12-19 MED ORDER — VITAMIN D (ERGOCALCIFEROL) 1.25 MG (50000 UNIT) PO CAPS
50000.0000 [IU] | ORAL_CAPSULE | ORAL | Status: DC
  Administered 2023-12-19: 50000 [IU] via ORAL
  Filled 2023-12-19: qty 1

## 2023-12-19 MED ORDER — CALCIUM GLUCONATE-NACL 2-0.675 GM/100ML-% IV SOLN
2.0000 g | Freq: Once | INTRAVENOUS | Status: AC
  Administered 2023-12-19: 2000 mg via INTRAVENOUS
  Filled 2023-12-19: qty 100

## 2023-12-19 NOTE — Plan of Care (Signed)

## 2023-12-19 NOTE — Progress Notes (Signed)
PROGRESS NOTE    Tammy Cordova  ZOX:[id number] DOB: August 03, 1966 DOA: 12/17/2023 PCP: Ollen Bowl, MD    Brief Narrative:  58 year old with history of COPD, HTN, RA, recently diagnosed with invasive small cell carcinoma on chemotherapy and radiation admitted to the hospital for severe dysphagia, failure to thrive and hypocalcemia.  Oncology team has been consulted.  During the hospitalization started on aggressive IV fluids, aggressive electrolyte repletion as well.   Assessment & Plan:  Principal Problem:   Esophagitis, acute    Severe esophagitis, likely radiation-induced - Admitting provider discussed case with [facility] who recommended Magic mouthwash and barium swallow.  If necessary they will be available.  Continue IV fluconazole. -PPI   Neutropenic fever - After Neupogen neutropenia has improved.  Will discontinue precautions, stop antibiotics.  No obvious evidence of infection noted  Abnormal Breath sounds Lasix Once, Nebs, IS/Flutter   Hypocalcemia/ HypoK Repletion   Small cell lung cancer -Management per oncology team  Failure to thrive - Will consult dietitian  Anemia of chronic disease, iron deficiency - IV iron while here, will transition to p.o. upon discharge  Vitamin D deficiency Folic acid deficiency - Supplements ordered   DVT prophylaxis: Lovenox    Code Status: Full Code Family Communication:   Severe electrolyte abnormality, continue hospital stay    Subjective: Seen at bedside, feels slightly better after getting electrolytes.   Examination:  General exam: Appears calm and comfortable  Respiratory system: Clear to auscultation. Respiratory effort normal. Cardiovascular system: S1 & S2 heard, RRR. No JVD, murmurs, rubs, gallops or clicks. No pedal edema. Gastrointestinal system: Abdomen is nondistended, soft and nontender. No organomegaly or masses felt. Normal bowel sounds heard. Central nervous system: Alert and oriented. No  focal neurological deficits. Extremities: Symmetric 5 x 5 power. Skin: No rashes, lesions or ulcers Psychiatry: Judgement and insight appear normal. Mood & affect appropriate. Chemo-Port in place           Pressure Injury 12/17/23 Ischial tuberosity Left;Posterior Stage 2 -  Partial thickness loss of dermis presenting as a shallow open injury with a red, pink wound bed without slough. round pressure injury (Active)  12/17/23 1800  Location: Ischial tuberosity  Location Orientation: Left;Posterior  Staging: Stage 2 -  Partial thickness loss of dermis presenting as a shallow open injury with a red, pink wound bed without slough.  Wound Description (Comments): round pressure injury  Present on Admission: Yes     Pressure Injury 12/17/23 Coccyx Posterior;Upper;Left Stage 2 -  Partial thickness loss of dermis presenting as a shallow open injury with a red, pink wound bed without slough. bilateral side. quarter size pressure injury b/w buttocks (Active)  12/17/23 1800  Location: Coccyx  Location Orientation: Posterior;Upper;Left  Staging: Stage 2 -  Partial thickness loss of dermis presenting as a shallow open injury with a red, pink wound bed without slough.  Wound Description (Comments): bilateral side. quarter size pressure injury b/w buttocks  Present on Admission: Yes     Diet Orders (From admission, onward)     Start     Ordered   12/17/23 1734  Diet regular Room service appropriate? Yes; Fluid consistency: Thin  Diet effective now       Question Answer Comment  Room service appropriate? Yes   Fluid consistency: Thin      12/17/23 1744            Objective: Vitals:   12/18/23 1338 12/18/23 1342 12/18/23 2124 12/19/23 0515  BP: 125/70  136/71 137/65  Pulse: 97  90 99  Resp: 20  16 16   Temp: 98.7 F (37.1 C)  98.6 F (37 C) 98.2 F (36.8 C)  TempSrc: Oral  Oral Oral  SpO2: (!) 89% 92% 91% 91%  Weight:      Height:        Intake/Output Summary (Last 24  hours) at 12/19/2023 1152 Last data filed at 12/19/2023 0936 Gross per 24 hour  Intake 2619.18 ml  Output 550 ml  Net 2069.18 ml   Filed Weights   12/17/23 1521  Weight: 62.6 kg    Scheduled Meds:  budesonide (PULMICORT) nebulizer solution  0.5 mg Nebulization BID   Chlorhexidine Gluconate Cloth  6 each Topical Daily   dronabinol  2.5 mg Oral BID AC   enoxaparin (LOVENOX) injection  40 mg Subcutaneous Q24H   feeding supplement  237 mL Oral BID BM   folic acid  1 mg Oral Daily   ipratropium-albuterol  3 mL Nebulization BID   magic mouthwash w/lidocaine  15 mL Oral QID   pantoprazole  40 mg Oral BID AC   Vitamin D (Ergocalciferol)  50,000 Units Oral Q7 days   Continuous Infusions:  fluconazole (DIFLUCAN) IV 400 mg (12/19/23 0820)   iron sucrose 420 mL/hr at 12/19/23 0313   promethazine (PHENERGAN) injection (IM or IVPB) 150 mL/hr at 12/19/23 0313    Nutritional status     Body mass index is 22.27 kg/m.  Data Reviewed:   CBC: Recent Labs  Lab 12/17/23 1038 12/18/23 0508 12/19/23 0531  WBC 1.1* 3.5* 7.0  NEUTROABS 0.5* 2.6 5.0  HGB 10.1* 8.8* 8.5*  HCT 31.8* 28.6* 28.0*  MCV 85.0 88.5 89.7  PLT 184 181 235   Basic Metabolic Panel: Recent Labs  Lab 12/17/23 1038 12/18/23 0700 12/18/23 1800 12/19/23 0531  NA 144 145 145 142  K 3.5 2.9* 4.2 3.8  CL 109 116* 118* 116*  CO2 24 23 20* 21*  GLUCOSE 105* 93 85 87  BUN 12 14 9 8   CREATININE 0.76 0.68 0.69 0.77  CALCIUM 5.8* 5.3* 6.2* 7.1*  MG 1.8  --   --  1.8  PHOS  --   --   --  2.6   GFR: Estimated Creatinine Clearance: 73.5 mL/min (by C-G formula based on SCr of 0.77 mg/dL). Liver Function Tests: Recent Labs  Lab 12/17/23 1038 12/18/23 0700 12/19/23 0531  AST 11* 13* 13*  ALT 9 11 11   ALKPHOS 74 55 59  BILITOT 0.3 0.4 0.3  PROT 7.9 6.5 6.4*  ALBUMIN 3.3* 2.0* 2.0*   No results for input(s): "LIPASE", "AMYLASE" in the last 168 hours. No results for input(s): "AMMONIA" in the last 168  hours. Coagulation Profile: No results for input(s): "INR", "PROTIME" in the last 168 hours. Cardiac Enzymes: No results for input(s): "CKTOTAL", "CKMB", "CKMBINDEX", "TROPONINI" in the last 168 hours. BNP (last 3 results) No results for input(s): "PROBNP" in the last 8760 hours. HbA1C: No results for input(s): "HGBA1C" in the last 72 hours. CBG: No results for input(s): "GLUCAP" in the last 168 hours. Lipid Profile: No results for input(s): "CHOL", "HDL", "LDLCALC", "TRIG", "CHOLHDL", "LDLDIRECT" in the last 72 hours. Thyroid Function Tests: Recent Labs    12/18/23 1154  TSH 0.163*   Anemia Panel: Recent Labs    12/17/23 1038 12/17/23 1039 12/18/23 1154 12/19/23 0531  VITAMINB12  --   --  404  --   FOLATE  --   --   --  5.8*  FERRITIN  --  409*  --   --   TIBC 164*  --   --   --   IRON 14*  --   --   --    Sepsis Labs: Recent Labs  Lab 12/17/23 1805  LATICACIDVEN 1.2    Recent Results (from the past 240 hours)  Culture, blood (Routine X 2) w Reflex to ID Panel     Status: None (Preliminary result)   Collection Time: 12/17/23  5:40 PM   Specimen: BLOOD  Result Value Ref Range Status   Specimen Description   Final    BLOOD SITE NOT SPECIFIED Performed at [facility], [address] [place], [place] [address]    Special Requests   Final    BOTTLES DRAWN AEROBIC AND ANAEROBIC Blood Culture adequate volume Performed at [facility], [address], [place], [place] [address]    Culture   Final    NO GROWTH 2 DAYS Performed at [facility], [address], [place], [place] [address]    Report Status PENDING  Incomplete  Culture, blood (Routine X 2) w Reflex to ID Panel     Status: None (Preliminary result)   Collection Time: 12/17/23  6:51 PM   Specimen: BLOOD LEFT HAND  Result Value Ref Range Status   Specimen Description   Final    BLOOD LEFT HAND Performed at [facility], [address],  [place], [place] [address]    Special Requests   Final    BOTTLES DRAWN AEROBIC ONLY Blood Culture results may not be optimal due to an inadequate volume of blood received in culture bottles Performed at [facility], [address], [place], [place] [address]    Culture   Final    NO GROWTH 2 DAYS Performed at [facility], [address] [place], [place] [address]    Report Status PENDING  Incomplete         Radiology Studies: DG Chest Port 1 View Result Date: 12/17/2023 CLINICAL DATA:  Cough.  History of non-small cell lung cancer. EXAM: PORTABLE CHEST 1 VIEW COMPARISON:  12/02/2023 FINDINGS: Left chest wall port a catheter is identified with tip at the superior cavoatrial junction. Stable cardiomediastinal contours. Chronic interstitial coarsening compatible with emphysema. Mild diffuse increase interstitial prominence is noted bilaterally. Increased right paratracheal opacity is identified over the medial right apex compatible with patient's known malignancy. The visualized osseous structures are unremarkable. IMPRESSION: 1. Mild diffuse increase interstitial prominence bilaterally. Differential considerations include mild pulmonary edema versus atypical infection. 2. Increased right paratracheal opacity compatible with patient's known malignancy. 3. Chronic changes of COPD/emphysema. Electronically Signed   By: Signa Kell M.D.   On: 12/17/2023 18:22           LOS: 1 day   Time spent= 35 mins    Miguel Rota, MD [facility]  If 7PM-7AM, please contact night-coverage  12/19/2023, 11:52 AM

## 2023-12-19 NOTE — Progress Notes (Signed)
OT Cancellation Note  Patient Details Name: Tammy Cordova MRN: [id number] DOB: 08/08/1966   Cancelled Treatment:    Reason Eval/Treat Not Completed: Patient declined, no reason specified Patient lethargic in bed with patient reporting it was from medications yesterday. OT to continue to follow and check back on 12/20/23 [facility] OTR/L, MS Acute Rehabilitation Department Office# [phone]  12/19/2023, 11:29 AM

## 2023-12-19 NOTE — Evaluation (Signed)
Physical Therapy Evaluation Patient Details Name: Tammy Cordova MRN: [id number] DOB: 11/04/1966 Today's Date: 12/19/2023  History of Present Illness  58 year old with history of COPD, HTN, RA, recently diagnosed with invasive small cell carcinoma on chemotherapy and radiation admitted to the hospital for severe dysphagia, failure to thrive and hypocalcemia.  Oncology team has been consulted.  During the hospitalization started on aggressive IV fluids, aggressive electrolyte repletion as well.  Clinical Impression  Patient evaluated by Physical Therapy with no further acute PT needs identified. All education has been completed and the patient has no further questions.  Pt declines amb at this time d/t not feeling well; mobilizes to EOB and SPT mod I to supervision. Pt states she does not feel she needs PT and will mobilize when she feels up to it with staff. May benefit from HHPT vs no f/u depending on progress.  See below for any follow-up Physical Therapy or equipment needs. PT is signing off. Thank you for this referral.         If plan is discharge home, recommend the following: Help with stairs or ramp for entrance   Can travel by private vehicle        Equipment Recommendations    Recommendations for Other Services       Functional Status Assessment Patient has not had a recent decline in their functional status     Precautions / Restrictions Precautions Precautions: Fall Precaution Comments: o2 at time of eval-2L      Mobility  Bed Mobility Overal bed mobility: Modified Independent       Supine to sit: Modified independent (Device/Increase time) Sit to supine: Modified independent (Device/Increase time)        Transfers   Equipment used: None Transfers: Sit to/from Stand Sit to Stand: Modified independent (Device/Increase time), Supervision   Step pivot transfers: Supervision       General transfer comment: for safety, nophysical assist     Ambulation/Gait               General Gait Details: pt declines  Stairs            Wheelchair Mobility     Tilt Bed    Modified Rankin (Stroke Patients Only)       Balance Overall balance assessment: No apparent balance deficits (not formally assessed)                                           Pertinent Vitals/Pain Pain Assessment Pain Assessment: Faces Pain Location: states yes, does not specify Pain Intervention(s): Monitored during session    Home Living Family/patient expects to be discharged to:: Private residence Living Arrangements: Spouse/significant other Available Help at Discharge: Family;Available PRN/intermittently Type of Home: House Home Access: Level entry       Home Layout: One level Home Equipment: None      Prior Function Prior Level of Function : Independent/Modified Independent                     Extremity/Trunk Assessment   Upper Extremity Assessment Upper Extremity Assessment: Defer to OT evaluation;Overall [facility] for tasks assessed    Lower Extremity Assessment Lower Extremity Assessment: Overall WFL for tasks assessed       Communication   Communication Communication: No apparent difficulties  Cognition Arousal: Alert Behavior During Therapy: WFL for tasks assessed/performed Overall Cognitive Status:  Within Functional Limits for tasks assessed                                          General Comments      Exercises     Assessment/Plan    PT Assessment Patient does not need any further PT services  PT Problem List         PT Treatment Interventions      PT Goals (Current goals can be found in the Care Plan section)  Acute Rehab PT Goals PT Goal Formulation: All assessment and education complete, DC therapy    Frequency       Co-evaluation               AM-PAC PT "6 Clicks" Mobility  Outcome Measure Help needed turning from your back to your  side while in a flat bed without using bedrails?: None Help needed moving from lying on your back to sitting on the side of a flat bed without using bedrails?: None Help needed moving to and from a bed to a chair (including a wheelchair)?: None Help needed standing up from a chair using your arms (e.g., wheelchair or bedside chair)?: None Help needed to walk in hospital room?: None Help needed climbing 3-5 steps with a railing? : A Little 6 Click Score: 23    End of Session   Activity Tolerance: Patient limited by fatigue Patient left: with call bell/phone within reach;in bed;with bed alarm set        Time: 4098-1191 PT Time Calculation (min) (ACUTE ONLY): 17 min   Charges:   PT Evaluation $PT Eval Low Complexity: 1 Low   PT General Charges $$ ACUTE PT VISIT: 1 Visit         Esthefany Herrig, PT  Acute Rehab Dept [facility]) [phone]  12/19/2023   [facility] 12/19/2023, 2:51 PM

## 2023-12-19 NOTE — Plan of Care (Signed)
  Problem: Health Behavior/Discharge Planning: Goal: Ability to manage health-related needs will improve Outcome: Progressing   Problem: Clinical Measurements: Goal: Respiratory complications will improve Outcome: Progressing Goal: Cardiovascular complication will be avoided Outcome: Progressing   Problem: Activity: Goal: Risk for activity intolerance will decrease Outcome: Progressing   Problem: Nutrition: Goal: Adequate nutrition will be maintained Outcome: Progressing   Problem: Elimination: Goal: Will not experience complications related to bowel motility Outcome: Progressing Goal: Will not experience complications related to urinary retention Outcome: Progressing   Problem: Pain Managment: Goal: General experience of comfort will improve and/or be controlled Outcome: Progressing   Problem: Safety: Goal: Ability to remain free from injury will improve Outcome: Progressing   Problem: Skin Integrity: Goal: Risk for impaired skin integrity will decrease Outcome: Progressing

## 2023-12-20 ENCOUNTER — Other Ambulatory Visit: Payer: Self-pay

## 2023-12-20 ENCOUNTER — Ambulatory Visit
Admission: RE | Admit: 2023-12-20 | Discharge: 2023-12-20 | Disposition: A | Discharge status: Home / Self Care | Payer: 59 | Source: Ambulatory Visit | Attending: Radiation Oncology | Admitting: Radiation Oncology

## 2023-12-20 DIAGNOSIS — K209 Esophagitis, unspecified without bleeding: Secondary | ICD-10-CM | POA: Diagnosis not present

## 2023-12-20 LAB — CBC WITH DIFFERENTIAL/PLATELET
Abs Immature Granulocytes: 0.47 10*3/uL — ABNORMAL HIGH (ref 0.00–0.07)
Basophils Absolute: 0.1 10*3/uL (ref 0.0–0.1)
Basophils Relative: 1 %
Eosinophils Absolute: 0 10*3/uL (ref 0.0–0.5)
Eosinophils Relative: 0 %
HCT: 26.9 % — ABNORMAL LOW (ref 36.0–46.0)
Hemoglobin: 8.2 g/dL — ABNORMAL LOW (ref 12.0–15.0)
Immature Granulocytes: 9 %
Lymphocytes Relative: 14 %
Lymphs Abs: 0.8 10*3/uL (ref 0.7–4.0)
MCH: 26.6 pg (ref 26.0–34.0)
MCHC: 30.5 g/dL (ref 30.0–36.0)
MCV: 87.3 fL (ref 80.0–100.0)
Monocytes Absolute: 0.6 10*3/uL (ref 0.1–1.0)
Monocytes Relative: 11 %
Neutro Abs: 3.6 10*3/uL (ref 1.7–7.7)
Neutrophils Relative %: 65 %
Platelets: 285 10*3/uL (ref 150–400)
RBC: 3.08 MIL/uL — ABNORMAL LOW (ref 3.87–5.11)
RDW: 16.2 % — ABNORMAL HIGH (ref 11.5–15.5)
WBC: 5.4 10*3/uL (ref 4.0–10.5)
nRBC: 1.1 % — ABNORMAL HIGH (ref 0.0–0.2)

## 2023-12-20 LAB — RAD ONC ARIA SESSION SUMMARY
Course Elapsed Days: 18
Plan Fractions Treated to Date: 12
Plan Prescribed Dose Per Fraction: 2.5 Gy
Plan Total Fractions Prescribed: 15
Plan Total Prescribed Dose: 37.5 Gy
Reference Point Dosage Given to Date: 30 Gy
Reference Point Session Dosage Given: 2.5 Gy
Session Number: 12

## 2023-12-20 LAB — MAGNESIUM: Magnesium: 1.7 mg/dL (ref 1.7–2.4)

## 2023-12-20 LAB — COMPREHENSIVE METABOLIC PANEL
ALT: 9 U/L (ref 0–44)
AST: 11 U/L — ABNORMAL LOW (ref 15–41)
Albumin: 1.8 g/dL — ABNORMAL LOW (ref 3.5–5.0)
Alkaline Phosphatase: 57 U/L (ref 38–126)
Anion gap: 5 (ref 5–15)
BUN: <5 mg/dL — ABNORMAL LOW (ref 6–20)
CO2: 25 mmol/L (ref 22–32)
Calcium: 6.8 mg/dL — ABNORMAL LOW (ref 8.9–10.3)
Chloride: 115 mmol/L — ABNORMAL HIGH (ref 98–111)
Creatinine, Ser: 0.77 mg/dL (ref 0.44–1.00)
GFR, Estimated: >60 mL/min (ref >60)
Glucose, Bld: 96 mg/dL (ref 70–99)
Potassium: 3.1 mmol/L — ABNORMAL LOW (ref 3.5–5.1)
Sodium: 145 mmol/L (ref 135–145)
Total Bilirubin: 0.4 mg/dL (ref 0.0–1.2)
Total Protein: 6.4 g/dL — ABNORMAL LOW (ref 6.5–8.1)

## 2023-12-20 LAB — CALCIUM, IONIZED: Calcium, Ionized, Serum: 4.4 mg/dL — ABNORMAL LOW (ref 4.5–5.6)

## 2023-12-20 LAB — BRAIN NATRIURETIC PEPTIDE: B Natriuretic Peptide: 473.3 pg/mL — ABNORMAL HIGH (ref 0.0–100.0)

## 2023-12-20 MED ORDER — POTASSIUM CHLORIDE 10 MEQ/50ML IV SOLN
10.0000 meq | INTRAVENOUS | Status: AC
  Administered 2023-12-20 (×4): 10 meq via INTRAVENOUS
  Filled 2023-12-20 (×4): qty 50

## 2023-12-20 MED ORDER — CALCIUM GLUCONATE-NACL 2-0.675 GM/100ML-% IV SOLN: 2.0000 g | Freq: Once | INTRAVENOUS | Status: DC

## 2023-12-20 MED ORDER — POTASSIUM CHLORIDE 20 MEQ PO PACK
40.0000 meq | PACK | Freq: Once | ORAL | Status: AC
  Administered 2023-12-20: 40 meq via ORAL
  Filled 2023-12-20: qty 2

## 2023-12-20 NOTE — Progress Notes (Signed)
OT Cancellation Note  Patient Details Name: Tammy Cordova MRN: [id number] DOB: June 02, 1966   Cancelled Treatment:    Reason Eval/Treat Not Completed: Patient declined, no reason specified;OT screened, no needs identified, will sign off Patient requested therapy leave her alone at this time. OT to sign off. Please re order if needs change.  Rosalio Loud, MS Acute Rehabilitation Department Office# [phone]  12/20/2023, 3:48 PM

## 2023-12-20 NOTE — Progress Notes (Signed)
Tammy Cordova is slowly improving.  I think Tammy Cordova is eating a little bit better.  Maybe, Tammy Cordova can restart Tammy Cordova radiation today.  Tammy Cordova labs show sodium 145.  Potassium 3.1.  BUN less than 5 creatinine 0.77.  Calcium 6.8 with an albumin of 1.8.  Tammy Cordova corrected calcium is 9.  Tammy Cordova white count 5.4.  Hemoglobin 8.2.  Platelet count 285,000.  At some point, Tammy Cordova may need to have some blood.  Tammy Cordova has had no bleeding.  Tammy Cordova has had a little bit of a cough.  Tammy Cordova has had no diarrhea.  There has been no rashes.  Tammy Cordova vital signs are temperature of 98.9.  Pulse 85.  Blood pressure 114/63.  Tammy Cordova lungs sound clear.  Oral exam does not show any mucositis or thrush.  Tammy Cordova cardiac exam regular rate and rhythm.  Abdomen is soft.  Bowel sounds are present.  There is no fluid wave.  There is no palpable liver or spleen tip.  Extremities shows no clubbing, cyanosis or edema.  Neurological exam shows no focal neurological deficits.  There is no further neutropenia.  This really should help with any healing that Tammy Cordova has some radiation esophagitis.  Again, I think Tammy Cordova can probably continue with Tammy Cordova radiotherapy right now.  I am glad that Tammy Cordova is coming back nicely.  I am going to give Tammy Cordova some potassium today.  Again we will have to watch Tammy Cordova blood count.  Tammy Cordova may need to be transfused.   Christin Bach, MD  Job [phone]

## 2023-12-20 NOTE — Plan of Care (Signed)

## 2023-12-20 NOTE — Progress Notes (Signed)
Initial Nutrition Assessment  DOCUMENTATION CODES:   Non-severe (moderate) malnutrition in context of chronic illness  INTERVENTION:   -Ensure Plus High Protein po BID, each supplement provides 350 kcal and 20 grams of protein.   -continue vitamin supplementation  NUTRITION DIAGNOSIS:   Moderate Malnutrition related to chronic illness, cancer and cancer related treatments as evidenced by mild fat depletion, mild muscle depletion, energy intake < 75% for > 7 days, percent weight loss.  GOAL:   Patient will meet greater than or equal to 90% of their needs   MONITOR:   PO intake, Supplement acceptance  REASON FOR ASSESSMENT:   Consult Assessment of nutrition requirement/status  ASSESSMENT:   58 year old with history of COPD, HTN, RA, recently diagnosed with invasive small cell carcinoma on chemotherapy and radiation admitted to the hospital for severe dysphagia, failure to thrive and hypocalcemia.  Patient in room, trying to eat some frosted flakes with milk. Pt grimacing in pain, states her swallowing had improved. She is using the Magic mouthwash. She continued to eat as she states she is never not without pain when eating lately. She has had poor appetite since her cancer diagnosis in December. The pain with swallowing developed following radiation treatments earlier this month. Pt has Ensure supplements ordered, wants to try strawberry.  Pt reports at times her throat acts like it doesn't want to swallow. Encouraged her to take her time eating.   Patient reports UBW ~155 lbs. Per weight records, pt has lost 17 lbs since 11/25/23 (10% wt loss x <1 month, significant for time frame).   Medications: [name], Folic acid, Magic Mouthwash w/ lidocaine, KLOR-CON, Vitamin D, KCl, Zofran  Labs reviewed: Low K Low folate Low Vitamin D   NUTRITION - FOCUSED PHYSICAL EXAM:  Flowsheet Row Most Recent Value  Orbital Region Mild depletion  Upper Arm Region Mild depletion   Thoracic and Lumbar Region No depletion  Buccal Region Mild depletion  Temple Region Mild depletion  Clavicle Bone Region Mild depletion  Clavicle and Acromion Bone Region Mild depletion  Scapular Bone Region Mild depletion  Dorsal Hand Mild depletion  Patellar Region Mild depletion  Anterior Thigh Region Mild depletion  Posterior Calf Region Mild depletion  Edema (RD Assessment) None  Hair Unable to assess  [covered]  Eyes Reviewed  Mouth Unable to assess  [eating and painful swallowing]  Skin Reviewed       Diet Order:   Diet Order             Diet regular Room service appropriate? Yes; Fluid consistency: Thin  Diet effective now                   EDUCATION NEEDS:   No education needs have been identified at this time  Skin:  Skin Assessment: Skin Integrity Issues: Skin Integrity Issues:: Stage II Stage II: IT, coccyx  Last BM:  1/20  Height:   Ht Readings from Last 1 Encounters:  12/17/23 5\' 6"  (1.676 m)    Weight:   Wt Readings from Last 1 Encounters:  12/17/23 62.6 kg    BMI:  Body mass index is 22.27 kg/m.  Estimated Nutritional Needs:   Kcal:  1850-2050  Protein:  90-100g  Fluid:  2L/day   Tilda Franco, MS, RD, LDN Inpatient Clinical Dietitian Contact via Secure chat

## 2023-12-20 NOTE — Progress Notes (Signed)
PROGRESS NOTE    Tammy Cordova  ZOX:[id number] DOB: 1966/06/02 DOA: 12/17/2023 PCP: Ollen Bowl, MD    Brief Narrative:  58 year old with history of COPD, HTN, RA, recently diagnosed with invasive small cell carcinoma on chemotherapy and radiation admitted to the hospital for severe dysphagia, failure to thrive and hypocalcemia.  Oncology team has been consulted.  During the hospitalization started on aggressive IV fluids, aggressive electrolyte repletion as well.   Assessment & Plan:  Principal Problem:   Esophagitis, acute    Severe esophagitis, likely radiation-induced - Admitting provider discussed case with [facility] who recommended Magic mouthwash and barium swallow.  If necessary they will be available.  Continue IV fluconazole. -PPI   Neutropenic fever - After Neupogen neutropenia has improved.  Will discontinue precautions, stop antibiotics.  No obvious evidence of infection noted  Abnormal Breath sounds Lasix Once, Nebs, IS/Flutter   Hypocalcemia/ HypoK Repletion   Small cell lung cancer -Management per oncology team  Failure to thrive - Will consult dietitian  Anemia of chronic disease, iron deficiency - IV iron while here, will transition to p.o. upon discharge  Vitamin D deficiency Folic acid deficiency - Supplements ordered   DVT prophylaxis: Lovenox    Code Status: Full Code Family Communication:   Severe electrolyte abnormality, continue hospital stay    Subjective: Slowly improving, no new complaints.  Examination:  General exam: Appears calm and comfortable  Respiratory system: Clear to auscultation. Respiratory effort normal. Cardiovascular system: S1 & S2 heard, RRR. No JVD, murmurs, rubs, gallops or clicks. No pedal edema. Gastrointestinal system: Abdomen is nondistended, soft and nontender. No organomegaly or masses felt. Normal bowel sounds heard. Central nervous system: Alert and oriented. No focal neurological  deficits. Extremities: Symmetric 5 x 5 power. Skin: No rashes, lesions or ulcers Psychiatry: Judgement and insight appear normal. Mood & affect appropriate. Chemo-Port in place           Pressure Injury 12/17/23 Ischial tuberosity Left;Posterior Stage 2 -  Partial thickness loss of dermis presenting as a shallow open injury with a red, pink wound bed without slough. round pressure injury (Active)  12/17/23 1800  Location: Ischial tuberosity  Location Orientation: Left;Posterior  Staging: Stage 2 -  Partial thickness loss of dermis presenting as a shallow open injury with a red, pink wound bed without slough.  Wound Description (Comments): round pressure injury  Present on Admission: Yes     Pressure Injury 12/17/23 Coccyx Posterior;Upper;Left Stage 2 -  Partial thickness loss of dermis presenting as a shallow open injury with a red, pink wound bed without slough. bilateral side. quarter size pressure injury b/w buttocks (Active)  12/17/23 1800  Location: Coccyx  Location Orientation: Posterior;Upper;Left  Staging: Stage 2 -  Partial thickness loss of dermis presenting as a shallow open injury with a red, pink wound bed without slough.  Wound Description (Comments): bilateral side. quarter size pressure injury b/w buttocks  Present on Admission: Yes     Diet Orders (From admission, onward)     Start     Ordered   12/17/23 1734  Diet regular Room service appropriate? Yes; Fluid consistency: Thin  Diet effective now       Question Answer Comment  Room service appropriate? Yes   Fluid consistency: Thin      12/17/23 1744            Objective: Vitals:   12/19/23 2105 12/19/23 2122 12/20/23 0436 12/20/23 0746  BP:  117/64 114/63   Pulse:  91 85   Resp:  20 20   Temp:  98.7 F (37.1 C) 98.9 F (37.2 C)   TempSrc:  Oral Oral   SpO2: 91% 94% 96% 97%  Weight:      Height:        Intake/Output Summary (Last 24 hours) at 12/20/2023 1226 Last data filed at 12/19/2023  1700 Gross per 24 hour  Intake --  Output 1300 ml  Net -1300 ml   Filed Weights   12/17/23 1521  Weight: 62.6 kg    Scheduled Meds:  budesonide (PULMICORT) nebulizer solution  0.5 mg Nebulization BID   Chlorhexidine Gluconate Cloth  6 each Topical Daily   dronabinol  2.5 mg Oral BID AC   enoxaparin (LOVENOX) injection  40 mg Subcutaneous Q24H   feeding supplement  237 mL Oral BID BM   folic acid  1 mg Oral Daily   ipratropium-albuterol  3 mL Nebulization BID   magic mouthwash w/lidocaine  15 mL Oral QID   pantoprazole  40 mg Oral BID AC   Vitamin D (Ergocalciferol)  50,000 Units Oral Q7 days   Continuous Infusions:  fluconazole (DIFLUCAN) IV 400 mg (12/20/23 0934)   iron sucrose 100 mg (12/20/23 0925)   promethazine (PHENERGAN) injection (IM or IVPB) 150 mL/hr at 12/19/23 0313    Nutritional status     Body mass index is 22.27 kg/m.  Data Reviewed:   CBC: Recent Labs  Lab 12/17/23 1038 12/18/23 0508 12/19/23 0531 12/20/23 0504  WBC 1.1* 3.5* 7.0 5.4  NEUTROABS 0.5* 2.6 5.0 3.6  HGB 10.1* 8.8* 8.5* 8.2*  HCT 31.8* 28.6* 28.0* 26.9*  MCV 85.0 88.5 89.7 87.3  PLT 184 181 235 285   Basic Metabolic Panel: Recent Labs  Lab 12/17/23 1038 12/18/23 0700 12/18/23 1800 12/19/23 0531 12/20/23 0504  NA 144 145 145 142 145  K 3.5 2.9* 4.2 3.8 3.1*  CL 109 116* 118* 116* 115*  CO2 24 23 20* 21* 25  GLUCOSE 105* 93 85 87 96  BUN 12 14 9 8  <5*  CREATININE 0.76 0.68 0.69 0.77 0.77  CALCIUM 5.8* 5.3* 6.2* 7.1* 6.8*  MG 1.8  --   --  1.8 1.7  PHOS  --   --   --  2.6  --    GFR: Estimated Creatinine Clearance: 73.5 mL/min (by C-G formula based on SCr of 0.77 mg/dL). Liver Function Tests: Recent Labs  Lab 12/17/23 1038 12/18/23 0700 12/19/23 0531 12/20/23 0504  AST 11* 13* 13* 11*  ALT 9 11 11 9   ALKPHOS 74 55 59 57  BILITOT 0.3 0.4 0.3 0.4  PROT 7.9 6.5 6.4* 6.4*  ALBUMIN 3.3* 2.0* 2.0* 1.8*   No results for input(s): "LIPASE", "AMYLASE" in the last  168 hours. No results for input(s): "AMMONIA" in the last 168 hours. Coagulation Profile: No results for input(s): "INR", "PROTIME" in the last 168 hours. Cardiac Enzymes: No results for input(s): "CKTOTAL", "CKMB", "CKMBINDEX", "TROPONINI" in the last 168 hours. BNP (last 3 results) No results for input(s): "PROBNP" in the last 8760 hours. HbA1C: No results for input(s): "HGBA1C" in the last 72 hours. CBG: No results for input(s): "GLUCAP" in the last 168 hours. Lipid Profile: No results for input(s): "CHOL", "HDL", "LDLCALC", "TRIG", "CHOLHDL", "LDLDIRECT" in the last 72 hours. Thyroid Function Tests: Recent Labs    12/18/23 1154  TSH 0.163*   Anemia Panel: Recent Labs    12/18/23 1154 12/19/23 0531  VITAMINB12 404  --  FOLATE  --  5.8*   Sepsis Labs: Recent Labs  Lab 12/17/23 1805  LATICACIDVEN 1.2    Recent Results (from the past 240 hours)  Culture, blood (Routine X 2) w Reflex to ID Panel     Status: None (Preliminary result)   Collection Time: 12/17/23  5:40 PM   Specimen: BLOOD  Result Value Ref Range Status   Specimen Description   Final    BLOOD SITE NOT SPECIFIED Performed at [facility], [address], [place], [place] [address]    Special Requests   Final    BOTTLES DRAWN AEROBIC AND ANAEROBIC Blood Culture adequate volume Performed at [facility], [address], [place], [place] [address]    Culture   Final    NO GROWTH 3 DAYS Performed at [facility], [address], [place], [place] [address]    Report Status PENDING  Incomplete  Culture, blood (Routine X 2) w Reflex to ID Panel     Status: None (Preliminary result)   Collection Time: 12/17/23  6:51 PM   Specimen: BLOOD LEFT HAND  Result Value Ref Range Status   Specimen Description   Final    BLOOD LEFT HAND Performed at [facility], [address] [place], [place] [address]    Special Requests   Final    BOTTLES DRAWN AEROBIC ONLY  Blood Culture results may not be optimal due to an inadequate volume of blood received in culture bottles Performed at [name], [address] [place], [place] [address]    Culture   Final    NO GROWTH 3 DAYS Performed at [facility], [address], [place], [place] [address]    Report Status PENDING  Incomplete         Radiology Studies: No results found.         LOS: 2 days   Time spent= 35 mins    Miguel Rota, MD [facility]  If 7PM-7AM, please contact night-coverage  12/20/2023, 12:26 PM

## 2023-12-21 ENCOUNTER — Ambulatory Visit: Payer: 59

## 2023-12-21 ENCOUNTER — Ambulatory Visit
Admit: 2023-12-21 | Discharge: 2023-12-21 | Disposition: A | Discharge status: Home / Self Care | Payer: 59 | Attending: Radiation Oncology | Admitting: Radiation Oncology

## 2023-12-21 ENCOUNTER — Other Ambulatory Visit: Payer: Self-pay

## 2023-12-21 ENCOUNTER — Ambulatory Visit
Admission: RE | Admit: 2023-12-21 | Discharge: 2023-12-21 | Disposition: A | Discharge status: Home / Self Care | Payer: 59 | Source: Ambulatory Visit | Attending: Radiation Oncology | Admitting: Radiation Oncology

## 2023-12-21 DIAGNOSIS — E440 Moderate protein-calorie malnutrition: Secondary | ICD-10-CM | POA: Insufficient documentation

## 2023-12-21 DIAGNOSIS — K209 Esophagitis, unspecified without bleeding: Secondary | ICD-10-CM | POA: Diagnosis not present

## 2023-12-21 LAB — RAD ONC ARIA SESSION SUMMARY
Course Elapsed Days: 19
Plan Fractions Treated to Date: 13
Plan Prescribed Dose Per Fraction: 2.5 Gy
Plan Total Fractions Prescribed: 15
Plan Total Prescribed Dose: 37.5 Gy
Reference Point Dosage Given to Date: 32.5 Gy
Reference Point Session Dosage Given: 2.5 Gy
Session Number: 13

## 2023-12-21 LAB — COMPREHENSIVE METABOLIC PANEL
ALT: 9 U/L (ref 0–44)
AST: 12 U/L — ABNORMAL LOW (ref 15–41)
Albumin: 2 g/dL — ABNORMAL LOW (ref 3.5–5.0)
Alkaline Phosphatase: 54 U/L (ref 38–126)
Anion gap: 3 — ABNORMAL LOW (ref 5–15)
BUN: <5 mg/dL — ABNORMAL LOW (ref 6–20)
CO2: 27 mmol/L (ref 22–32)
Calcium: 6.7 mg/dL — ABNORMAL LOW (ref 8.9–10.3)
Chloride: 111 mmol/L (ref 98–111)
Creatinine, Ser: 0.53 mg/dL (ref 0.44–1.00)
GFR, Estimated: >60 mL/min (ref >60)
Glucose, Bld: 105 mg/dL — ABNORMAL HIGH (ref 70–99)
Potassium: 2.9 mmol/L — ABNORMAL LOW (ref 3.5–5.1)
Sodium: 141 mmol/L (ref 135–145)
Total Bilirubin: 0.3 mg/dL (ref 0.0–1.2)
Total Protein: 6.5 g/dL (ref 6.5–8.1)

## 2023-12-21 LAB — CBC WITH DIFFERENTIAL/PLATELET
Abs Immature Granulocytes: 0.55 10*3/uL — ABNORMAL HIGH (ref 0.00–0.07)
Basophils Absolute: 0.1 10*3/uL (ref 0.0–0.1)
Basophils Relative: 1 %
Eosinophils Absolute: 0 10*3/uL (ref 0.0–0.5)
Eosinophils Relative: 1 %
HCT: 26.7 % — ABNORMAL LOW (ref 36.0–46.0)
Hemoglobin: 8.4 g/dL — ABNORMAL LOW (ref 12.0–15.0)
Immature Granulocytes: 13 %
Lymphocytes Relative: 17 %
Lymphs Abs: 0.7 10*3/uL (ref 0.7–4.0)
MCH: 26.8 pg (ref 26.0–34.0)
MCHC: 31.5 g/dL (ref 30.0–36.0)
MCV: 85 fL (ref 80.0–100.0)
Monocytes Absolute: 0.4 10*3/uL (ref 0.1–1.0)
Monocytes Relative: 9 %
Neutro Abs: 2.5 10*3/uL (ref 1.7–7.7)
Neutrophils Relative %: 59 %
Platelets: 298 10*3/uL (ref 150–400)
RBC: 3.14 MIL/uL — ABNORMAL LOW (ref 3.87–5.11)
RDW: 16 % — ABNORMAL HIGH (ref 11.5–15.5)
WBC: 4.3 10*3/uL (ref 4.0–10.5)
nRBC: 2.1 % — ABNORMAL HIGH (ref 0.0–0.2)

## 2023-12-21 LAB — HEMOGLOBIN AND HEMATOCRIT, BLOOD
HCT: 31.1 % — ABNORMAL LOW (ref 36.0–46.0)
Hemoglobin: 9.7 g/dL — ABNORMAL LOW (ref 12.0–15.0)

## 2023-12-21 LAB — MAGNESIUM: Magnesium: 1.7 mg/dL (ref 1.7–2.4)

## 2023-12-21 LAB — PREPARE RBC (CROSSMATCH)

## 2023-12-21 LAB — ABO/RH: ABO/RH(D): A POS

## 2023-12-21 MED ORDER — SODIUM CHLORIDE 0.9% FLUSH
10.0000 mL | Freq: Two times a day (BID) | INTRAVENOUS | Status: DC
  Administered 2023-12-21 – 2023-12-24 (×7): 10 mL

## 2023-12-21 MED ORDER — SODIUM CHLORIDE 0.9% FLUSH: 10.0000 mL | INTRAVENOUS | Status: DC | PRN

## 2023-12-21 MED ORDER — MAGNESIUM OXIDE -MG SUPPLEMENT 400 (240 MG) MG PO TABS
800.0000 mg | ORAL_TABLET | Freq: Once | ORAL | Status: AC
  Administered 2023-12-21: 800 mg via ORAL
  Filled 2023-12-21: qty 2

## 2023-12-21 MED ORDER — SODIUM CHLORIDE 0.9% IV SOLUTION: Freq: Once | INTRAVENOUS | Status: AC

## 2023-12-21 MED ORDER — FUROSEMIDE 10 MG/ML IJ SOLN
20.0000 mg | Freq: Once | INTRAMUSCULAR | Status: AC
  Administered 2023-12-21: 20 mg via INTRAVENOUS
  Filled 2023-12-21: qty 2

## 2023-12-21 MED ORDER — POTASSIUM CHLORIDE 20 MEQ PO PACK
40.0000 meq | PACK | Freq: Once | ORAL | Status: AC
  Administered 2023-12-21: 40 meq via ORAL
  Filled 2023-12-21: qty 2

## 2023-12-21 MED ORDER — POTASSIUM CHLORIDE 10 MEQ/50ML IV SOLN
10.0000 meq | INTRAVENOUS | Status: AC
  Administered 2023-12-21 (×6): 10 meq via INTRAVENOUS
  Filled 2023-12-21 (×3): qty 50

## 2023-12-21 NOTE — Progress Notes (Signed)
Tammy Cordova is feeling okay.  She is able to swallow okay.  On occasion, she does have a little bit of odynophagia.  I think she had another radiation therapy yesterday.  I am not sure when she finishes up her radiation.  I do think she will benefit from 1 unit of blood.  Her hemoglobin is 8.4.  I do think 1 unit of blood will help her with respect to try to heal the radiation damage of her esophagus.  I think we have stop the Diflucan.  I do not think she needs always peripheral IVs, particular when she has a Port-A-Cath.  I think we can probably DC the IVs in her right arm.  Her potassium is only 2.9.  I am not sure why she is so hypokalemic.  Her calcium is doing okay.  Her white cell count is 4.3.  Hemoglobin 8.4.  Platelet count 298,000. Her sodium is 141.  Potassium 2.9.  BUN less than 5 creatinine 0.53.  Calcium 6.7.  Magnesium 1.7.  Her albumin is 2.0.  She has no fever.  All of her vital signs are stable.  Is really no change in her physical exam.  I see no oral lesions.  Her lungs are clear bilaterally.  Cardiac exam regular rate and rhythm.  Abdomen soft.  Bowel sounds are present.  There is no fluid wave.  Extremities shows no clubbing, cyanosis or edema.  Tammy Cordova is feeling better.  She is eating a little bit better.  Again I am unsure how much more radiation she we will get.  I do think 1 unit of blood will help her.  I know that she is getting great care from everybody upon [address].   Christin Bach, MD  Psalms 54:7

## 2023-12-21 NOTE — Progress Notes (Signed)
PROGRESS NOTE    Tammy Cordova  XBJ:[id number] DOB: [birth year] DOA: 12/17/2023 PCP: Ollen Bowl, MD    Brief Narrative:  58 year old with history of COPD, HTN, RA, recently diagnosed with invasive small cell carcinoma on chemotherapy and radiation admitted to the hospital for severe dysphagia, failure to thrive and hypocalcemia.  Oncology team has been consulted.  During the hospitalization started on aggressive IV fluids, aggressive electrolyte repletion as well.   Assessment & Plan:  Principal Problem:   Esophagitis, acute    Severe esophagitis, likely radiation-induced - Admitting provider discussed case with [facility] who recommended Magic mouthwash and barium swallow.  Supportive care, stop Diflucan. -PPI   Neutropenic fever - After Neupogen neutropenia has improved.  Will discontinue precautions, stop antibiotics.  No obvious evidence of infection noted  Abnormal Breath sounds Additional Lasix one-time, Nebs, IS/Flutter   Hypocalcemia/ HypoK Aggressive repletion   Small cell lung cancer -Management per oncology team  Failure to thrive -  dietitian  Anemia of chronic disease, iron deficiency - IV iron while here, will transition to p.o. upon discharge -Oncology ordered 1 unit of transfusion  Vitamin D deficiency Folic acid deficiency - Supplements ordered   DVT prophylaxis: Lovenox   Code Status: Full Code Family Communication:   Severe electrolyte abnormality, continue hospital stay    Subjective: Seen at bedside Overall still feeling weak no other complaints  Examination:  General exam: Appears calm and comfortable  Respiratory system: Clear to auscultation. Respiratory effort normal. Cardiovascular system: S1 & S2 heard, RRR. No JVD, murmurs, rubs, gallops or clicks. No pedal edema. Gastrointestinal system: Abdomen is nondistended, soft and nontender. No organomegaly or masses felt. Normal bowel sounds heard. Central nervous system: Alert and  oriented. No focal neurological deficits. Extremities: Symmetric 5 x 5 power. Skin: No rashes, lesions or ulcers Psychiatry: Judgement and insight appear normal. Mood & affect appropriate. Chemo-Port in place           Pressure Injury 12/17/23 Ischial tuberosity Left;Posterior Stage 2 -  Partial thickness loss of dermis presenting as a shallow open injury with a red, pink wound bed without slough. round pressure injury (Active)  12/17/23 1800  Location: Ischial tuberosity  Location Orientation: Left;Posterior  Staging: Stage 2 -  Partial thickness loss of dermis presenting as a shallow open injury with a red, pink wound bed without slough.  Wound Description (Comments): round pressure injury  Present on Admission: Yes     Pressure Injury 12/17/23 Coccyx Posterior;Upper;Left Stage 2 -  Partial thickness loss of dermis presenting as a shallow open injury with a red, pink wound bed without slough. bilateral side. quarter size pressure injury b/w buttocks (Active)  12/17/23 1800  Location: Coccyx  Location Orientation: Posterior;Upper;Left  Staging: Stage 2 -  Partial thickness loss of dermis presenting as a shallow open injury with a red, pink wound bed without slough.  Wound Description (Comments): bilateral side. quarter size pressure injury b/w buttocks  Present on Admission: Yes     Diet Orders (From admission, onward)     Start     Ordered   12/17/23 1734  Diet regular Room service appropriate? Yes; Fluid consistency: Thin  Diet effective now       Question Answer Comment  Room service appropriate? Yes   Fluid consistency: Thin      12/17/23 1744            Objective: Vitals:   12/21/23 0943 12/21/23 1139 12/21/23 1151 12/21/23 1204  BP:  124/62 124/62 138/69  Pulse:  93 93 78  Resp:  14 14 (!) 24  Temp:  98.3 F (36.8 C) 98.3 F (36.8 C) 98 F (36.7 C)  TempSrc:  Oral Oral Oral  SpO2: 97% 98%  98%  Weight:      Height:       No intake or output data  in the 24 hours ending 12/21/23 1302 Filed Weights   12/17/23 1521  Weight: 62.6 kg    Scheduled Meds:  budesonide (PULMICORT) nebulizer solution  0.5 mg Nebulization BID   Chlorhexidine Gluconate Cloth  6 each Topical Daily   dronabinol  2.5 mg Oral BID AC   enoxaparin (LOVENOX) injection  40 mg Subcutaneous Q24H   feeding supplement  237 mL Oral BID BM   folic acid  1 mg Oral Daily   ipratropium-albuterol  3 mL Nebulization BID   magic mouthwash w/lidocaine  15 mL Oral QID   pantoprazole  40 mg Oral BID AC   sodium chloride flush  10-40 mL Intracatheter Q12H   Vitamin D (Ergocalciferol)  50,000 Units Oral Q7 days   Continuous Infusions:  potassium chloride 10 mEq (12/21/23 1221)   promethazine (PHENERGAN) injection (IM or IVPB) 150 mL/hr at 12/19/23 0313    Nutritional status Signs/Symptoms: mild fat depletion, mild muscle depletion, energy intake < 75% for > 7 days, percent weight loss Interventions: Ensure Enlive (each supplement provides 350kcal and 20 grams of protein) Body mass index is 22.27 kg/m.  Data Reviewed:   CBC: Recent Labs  Lab 12/17/23 1038 12/18/23 0508 12/19/23 0531 12/20/23 0504 12/21/23 0456  WBC 1.1* 3.5* 7.0 5.4 4.3  NEUTROABS 0.5* 2.6 5.0 3.6 2.5  HGB 10.1* 8.8* 8.5* 8.2* 8.4*  HCT 31.8* 28.6* 28.0* 26.9* 26.7*  MCV 85.0 88.5 89.7 87.3 85.0  PLT 184 181 235 285 298   Basic Metabolic Panel: Recent Labs  Lab 12/17/23 1038 12/18/23 0700 12/18/23 1800 12/19/23 0531 12/20/23 0504 12/21/23 0456  NA 144 145 145 142 145 141  K 3.5 2.9* 4.2 3.8 3.1* 2.9*  CL 109 116* 118* 116* 115* 111  CO2 24 23 20* 21* 25 27  GLUCOSE 105* 93 85 87 96 105*  BUN 12 14 9 8  <5* <5*  CREATININE 0.76 0.68 0.69 0.77 0.77 0.53  CALCIUM 5.8* 5.3* 6.2* 7.1* 6.8* 6.7*  MG 1.8  --   --  1.8 1.7 1.7  PHOS  --   --   --  2.6  --   --    GFR: Estimated Creatinine Clearance: 73.5 mL/min (by C-G formula based on SCr of 0.53 mg/dL). Liver Function Tests: Recent  Labs  Lab 12/17/23 1038 12/18/23 0700 12/19/23 0531 12/20/23 0504 12/21/23 0456  AST 11* 13* 13* 11* 12*  ALT 9 11 11 9 9   ALKPHOS 74 55 59 57 54  BILITOT 0.3 0.4 0.3 0.4 0.3  PROT 7.9 6.5 6.4* 6.4* 6.5  ALBUMIN 3.3* 2.0* 2.0* 1.8* 2.0*   No results for input(s): "LIPASE", "AMYLASE" in the last 168 hours. No results for input(s): "AMMONIA" in the last 168 hours. Coagulation Profile: No results for input(s): "INR", "PROTIME" in the last 168 hours. Cardiac Enzymes: No results for input(s): "CKTOTAL", "CKMB", "CKMBINDEX", "TROPONINI" in the last 168 hours. BNP (last 3 results) No results for input(s): "PROBNP" in the last 8760 hours. HbA1C: No results for input(s): "HGBA1C" in the last 72 hours. CBG: No results for input(s): "GLUCAP" in the last 168 hours. Lipid Profile: No  results for input(s): "CHOL", "HDL", "LDLCALC", "TRIG", "CHOLHDL", "LDLDIRECT" in the last 72 hours. Thyroid Function Tests: No results for input(s): "TSH", "T4TOTAL", "FREET4", "T3FREE", "THYROIDAB" in the last 72 hours. Anemia Panel: Recent Labs    12/19/23 0531  FOLATE 5.8*   Sepsis Labs: Recent Labs  Lab 12/17/23 1805  LATICACIDVEN 1.2    Recent Results (from the past 240 hours)  Culture, blood (Routine X 2) w Reflex to ID Panel     Status: None (Preliminary result)   Collection Time: 12/17/23  5:40 PM   Specimen: BLOOD  Result Value Ref Range Status   Specimen Description   Final    BLOOD SITE NOT SPECIFIED Performed at [facility], [address] [place], [place] [address]    Special Requests   Final    BOTTLES DRAWN AEROBIC AND ANAEROBIC Blood Culture adequate volume Performed at [facility], [address], [place], [place] [address]    Culture   Final    NO GROWTH 4 DAYS Performed at [facility], [address], [place], [place] [address]    Report Status PENDING  Incomplete  Culture, blood (Routine X 2) w Reflex to ID Panel      Status: None (Preliminary result)   Collection Time: 12/17/23  6:51 PM   Specimen: BLOOD LEFT HAND  Result Value Ref Range Status   Specimen Description   Final    BLOOD LEFT HAND Performed at [facility], [address], [place], [place] [address]    Special Requests   Final    BOTTLES DRAWN AEROBIC ONLY Blood Culture results may not be optimal due to an inadequate volume of blood received in culture bottles Performed at [facility], [address] [place], [place] [address]    Culture   Final    NO GROWTH 4 DAYS Performed at [facility], [address] [place], [place] [address]    Report Status PENDING  Incomplete         Radiology Studies: No results found.         LOS: 3 days   Time spent= 35 mins    Miguel Rota, MD [facility]  If 7PM-7AM, please contact night-coverage  12/21/2023, 1:02 PM

## 2023-12-21 NOTE — Plan of Care (Signed)

## 2023-12-22 ENCOUNTER — Ambulatory Visit: Payer: 59

## 2023-12-22 ENCOUNTER — Other Ambulatory Visit: Payer: Self-pay

## 2023-12-22 ENCOUNTER — Ambulatory Visit
Admission: RE | Admit: 2023-12-22 | Discharge: 2023-12-22 | Discharge status: Home / Self Care | Payer: 59 | Source: Ambulatory Visit | Attending: Radiation Oncology

## 2023-12-22 DIAGNOSIS — K209 Esophagitis, unspecified without bleeding: Secondary | ICD-10-CM | POA: Diagnosis not present

## 2023-12-22 LAB — PHOSPHORUS: Phosphorus: 2.6 mg/dL (ref 2.5–4.6)

## 2023-12-22 LAB — RAD ONC ARIA SESSION SUMMARY
Course Elapsed Days: 20
Plan Fractions Treated to Date: 14
Plan Prescribed Dose Per Fraction: 2.5 Gy
Plan Total Fractions Prescribed: 15
Plan Total Prescribed Dose: 37.5 Gy
Reference Point Dosage Given to Date: 35 Gy
Reference Point Session Dosage Given: 2.5 Gy
Session Number: 14

## 2023-12-22 LAB — COMPREHENSIVE METABOLIC PANEL
ALT: 9 U/L (ref 0–44)
AST: 13 U/L — ABNORMAL LOW (ref 15–41)
Albumin: 2.1 g/dL — ABNORMAL LOW (ref 3.5–5.0)
Alkaline Phosphatase: 63 U/L (ref 38–126)
Anion gap: 7 (ref 5–15)
BUN: 6 mg/dL (ref 6–20)
CO2: 28 mmol/L (ref 22–32)
Calcium: 7 mg/dL — ABNORMAL LOW (ref 8.9–10.3)
Chloride: 108 mmol/L (ref 98–111)
Creatinine, Ser: 0.48 mg/dL (ref 0.44–1.00)
GFR, Estimated: >60 mL/min (ref >60)
Glucose, Bld: 104 mg/dL — ABNORMAL HIGH (ref 70–99)
Potassium: 3.5 mmol/L (ref 3.5–5.1)
Sodium: 143 mmol/L (ref 135–145)
Total Bilirubin: 0.3 mg/dL (ref 0.0–1.2)
Total Protein: 6.9 g/dL (ref 6.5–8.1)

## 2023-12-22 LAB — BPAM RBC
Blood Product Expiration Date: 202502122359
ISSUE DATE / TIME: 202501211146
Unit Type and Rh: 6200

## 2023-12-22 LAB — TYPE AND SCREEN
ABO/RH(D): A POS
Antibody Screen: NEGATIVE
Unit division: 0

## 2023-12-22 LAB — CBC WITH DIFFERENTIAL/PLATELET
Abs Immature Granulocytes: 0.52 10*3/uL — ABNORMAL HIGH (ref 0.00–0.07)
Basophils Absolute: 0.1 10*3/uL (ref 0.0–0.1)
Basophils Relative: 2 %
Eosinophils Absolute: 0 10*3/uL (ref 0.0–0.5)
Eosinophils Relative: 1 %
HCT: 33.2 % — ABNORMAL LOW (ref 36.0–46.0)
Hemoglobin: 10.2 g/dL — ABNORMAL LOW (ref 12.0–15.0)
Immature Granulocytes: 13 %
Lymphocytes Relative: 17 %
Lymphs Abs: 0.7 10*3/uL (ref 0.7–4.0)
MCH: 26.9 pg (ref 26.0–34.0)
MCHC: 30.7 g/dL (ref 30.0–36.0)
MCV: 87.6 fL (ref 80.0–100.0)
Monocytes Absolute: 0.3 10*3/uL (ref 0.1–1.0)
Monocytes Relative: 8 %
Neutro Abs: 2.3 10*3/uL (ref 1.7–7.7)
Neutrophils Relative %: 59 %
Platelet Morphology: NORMAL
Platelets: 321 10*3/uL (ref 150–400)
RBC: 3.79 MIL/uL — ABNORMAL LOW (ref 3.87–5.11)
RDW: 16.1 % — ABNORMAL HIGH (ref 11.5–15.5)
WBC: 3.9 10*3/uL — ABNORMAL LOW (ref 4.0–10.5)
nRBC: 0.8 % — ABNORMAL HIGH (ref 0.0–0.2)

## 2023-12-22 LAB — PARATHYROID HORMONE, INTACT (NO CA)

## 2023-12-22 LAB — CULTURE, BLOOD (ROUTINE X 2)
Culture: NO GROWTH
Culture: NO GROWTH
Special Requests: ADEQUATE

## 2023-12-22 LAB — CALCIUM, IONIZED
Calcium, Ionized, Serum: 4.1 mg/dL — ABNORMAL LOW (ref 4.5–5.6)
Calcium, Ionized, Serum: 4.1 mg/dL — ABNORMAL LOW (ref 4.5–5.6)

## 2023-12-22 LAB — MAGNESIUM: Magnesium: 1.7 mg/dL (ref 1.7–2.4)

## 2023-12-22 MED ORDER — POTASSIUM CHLORIDE CRYS ER 20 MEQ PO TBCR
40.0000 meq | EXTENDED_RELEASE_TABLET | Freq: Once | ORAL | Status: AC
  Administered 2023-12-22: 40 meq via ORAL
  Filled 2023-12-22: qty 2

## 2023-12-22 NOTE — Progress Notes (Signed)
PROGRESS NOTE    Tammy Cordova  WGN:[id number] DOB: 06/02/1966 DOA: 12/17/2023 PCP: Ollen Bowl, MD    Brief Narrative:  58 year old with history of COPD, HTN, RA, recently diagnosed with invasive small cell carcinoma on chemotherapy and radiation admitted to the hospital for severe dysphagia, failure to thrive and hypocalcemia.  Oncology team has been consulted.  During the hospitalization started on aggressive IV fluids, aggressive electrolyte repletion as well.   Assessment & Plan:  Principal Problem:   Esophagitis, acute    Severe esophagitis, likely radiation-induced - Admitting provider discussed case with [facility] who recommended Magic mouthwash and barium swallow.  Supportive care, stop Diflucan. -PPI   Neutropenic fever - After Neupogen neutropenia has improved.  Will discontinue precautions, stop antibiotics.  No obvious evidence of infection noted  Abnormal Breath sounds Additional Lasix one-time, Nebs, IS/Flutter   Hypocalcemia/ HypoK Aggressive repletion   Small cell lung cancer -Management per oncology team  Failure to thrive -  dietitian  Anemia of chronic disease, iron deficiency - IV iron while here, will transition to p.o. upon discharge - Hemoglobin proved after transfusion by oncology  Vitamin D deficiency Folic acid deficiency - Supplements ordered   DVT prophylaxis: Lovenox   Code Status: Full Code Family Communication:   Severe electrolyte abnormality, continue hospital stay Hopefully home tomorrow per Dr. Myna Hidalgo   Subjective: Feels better, no complaints  Examination:  General exam: Appears calm and comfortable  Respiratory system: Clear to auscultation. Respiratory effort normal. Cardiovascular system: S1 & S2 heard, RRR. No JVD, murmurs, rubs, gallops or clicks. No pedal edema. Gastrointestinal system: Abdomen is nondistended, soft and nontender. No organomegaly or masses felt. Normal bowel sounds heard. Central nervous  system: Alert and oriented. No focal neurological deficits. Extremities: Symmetric 5 x 5 power. Skin: No rashes, lesions or ulcers Psychiatry: Judgement and insight appear normal. Mood & affect appropriate. Chemo-Port in place           Pressure Injury 12/17/23 Ischial tuberosity Left;Posterior Stage 2 -  Partial thickness loss of dermis presenting as a shallow open injury with a red, pink wound bed without slough. round pressure injury (Active)  12/17/23 1800  Location: Ischial tuberosity  Location Orientation: Left;Posterior  Staging: Stage 2 -  Partial thickness loss of dermis presenting as a shallow open injury with a red, pink wound bed without slough.  Wound Description (Comments): round pressure injury  Present on Admission: Yes     Pressure Injury 12/17/23 Coccyx Posterior;Upper;Left Stage 2 -  Partial thickness loss of dermis presenting as a shallow open injury with a red, pink wound bed without slough. bilateral side. quarter size pressure injury b/w buttocks (Active)  12/17/23 1800  Location: Coccyx  Location Orientation: Posterior;Upper;Left  Staging: Stage 2 -  Partial thickness loss of dermis presenting as a shallow open injury with a red, pink wound bed without slough.  Wound Description (Comments): bilateral side. quarter size pressure injury b/w buttocks  Present on Admission: Yes     Diet Orders (From admission, onward)     Start     Ordered   12/17/23 1734  Diet regular Room service appropriate? Yes; Fluid consistency: Thin  Diet effective now       Question Answer Comment  Room service appropriate? Yes   Fluid consistency: Thin      12/17/23 1744            Objective: Vitals:   12/21/23 1424 12/21/23 2106 12/22/23 0519 12/22/23 0755  BP: 134/67  120/63 128/74   Pulse: 81 87 89   Resp: 18 14 14    Temp: 98.2 F (36.8 C) 98.6 F (37 C) 97.7 F (36.5 C)   TempSrc: Oral Oral Oral   SpO2: 94% 94% 96% 96%  Weight:      Height:         Intake/Output Summary (Last 24 hours) at 12/22/2023 1118 Last data filed at 12/21/2023 1426 Gross per 24 hour  Intake 0 ml  Output --  Net 0 ml   Filed Weights   12/17/23 1521  Weight: 62.6 kg    Scheduled Meds:  budesonide (PULMICORT) nebulizer solution  0.5 mg Nebulization BID   Chlorhexidine Gluconate Cloth  6 each Topical Daily   dronabinol  2.5 mg Oral BID AC   enoxaparin (LOVENOX) injection  40 mg Subcutaneous Q24H   feeding supplement  237 mL Oral BID BM   folic acid  1 mg Oral Daily   ipratropium-albuterol  3 mL Nebulization BID   magic mouthwash w/lidocaine  15 mL Oral QID   pantoprazole  40 mg Oral BID AC   sodium chloride flush  10-40 mL Intracatheter Q12H   Vitamin D (Ergocalciferol)  50,000 Units Oral Q7 days   Continuous Infusions:  promethazine (PHENERGAN) injection (IM or IVPB) 150 mL/hr at 12/19/23 0313    Nutritional status Signs/Symptoms: mild fat depletion, mild muscle depletion, energy intake < 75% for > 7 days, percent weight loss Interventions: Ensure Enlive (each supplement provides 350kcal and 20 grams of protein) Body mass index is 22.27 kg/m.  Data Reviewed:   CBC: Recent Labs  Lab 12/18/23 0508 12/19/23 0531 12/20/23 0504 12/21/23 0456 12/21/23 1700 12/22/23 0531  WBC 3.5* 7.0 5.4 4.3  --  3.9*  NEUTROABS 2.6 5.0 3.6 2.5  --  2.3  HGB 8.8* 8.5* 8.2* 8.4* 9.7* 10.2*  HCT 28.6* 28.0* 26.9* 26.7* 31.1* 33.2*  MCV 88.5 89.7 87.3 85.0  --  87.6  PLT 181 235 285 298  --  321   Basic Metabolic Panel: Recent Labs  Lab 12/17/23 1038 12/18/23 0700 12/18/23 1800 12/19/23 0531 12/20/23 0504 12/21/23 0456 12/22/23 0531  NA 144   < > 145 142 145 141 143  K 3.5   < > 4.2 3.8 3.1* 2.9* 3.5  CL 109   < > 118* 116* 115* 111 108  CO2 24   < > 20* 21* 25 27 28   GLUCOSE 105*   < > 85 87 96 105* 104*  BUN 12   < > 9 8 <5* <5* 6  CREATININE 0.76   < > 0.69 0.77 0.77 0.53 0.48  CALCIUM 5.8*   < > 6.2* 7.1* 6.8* 6.7* 7.0*  MG 1.8  --   --   1.8 1.7 1.7 1.7  PHOS  --   --   --  2.6  --   --  2.6   < > = values in this interval not displayed.   GFR: Estimated Creatinine Clearance: 73.5 mL/min (by C-G formula based on SCr of 0.48 mg/dL). Liver Function Tests: Recent Labs  Lab 12/18/23 0700 12/19/23 0531 12/20/23 0504 12/21/23 0456 12/22/23 0531  AST 13* 13* 11* 12* 13*  ALT 11 11 9 9 9   ALKPHOS 55 59 57 54 63  BILITOT 0.4 0.3 0.4 0.3 0.3  PROT 6.5 6.4* 6.4* 6.5 6.9  ALBUMIN 2.0* 2.0* 1.8* 2.0* 2.1*   No results for input(s): "LIPASE", "AMYLASE" in the last 168 hours. No results for  input(s): "AMMONIA" in the last 168 hours. Coagulation Profile: No results for input(s): "INR", "PROTIME" in the last 168 hours. Cardiac Enzymes: No results for input(s): "CKTOTAL", "CKMB", "CKMBINDEX", "TROPONINI" in the last 168 hours. BNP (last 3 results) No results for input(s): "PROBNP" in the last 8760 hours. HbA1C: No results for input(s): "HGBA1C" in the last 72 hours. CBG: No results for input(s): "GLUCAP" in the last 168 hours. Lipid Profile: No results for input(s): "CHOL", "HDL", "LDLCALC", "TRIG", "CHOLHDL", "LDLDIRECT" in the last 72 hours. Thyroid Function Tests: No results for input(s): "TSH", "T4TOTAL", "FREET4", "T3FREE", "THYROIDAB" in the last 72 hours. Anemia Panel: No results for input(s): "VITAMINB12", "FOLATE", "FERRITIN", "TIBC", "IRON", "RETICCTPCT" in the last 72 hours. Sepsis Labs: Recent Labs  Lab 12/17/23 1805  LATICACIDVEN 1.2    Recent Results (from the past 240 hours)  Culture, blood (Routine X 2) w Reflex to ID Panel     Status: None   Collection Time: 12/17/23  5:40 PM   Specimen: BLOOD  Result Value Ref Range Status   Specimen Description   Final    BLOOD SITE NOT SPECIFIED Performed at [facility], [address], [place], [place] [address]    Special Requests   Final    BOTTLES DRAWN AEROBIC AND ANAEROBIC Blood Culture adequate volume Performed at [facility], [address], [place], [place] [address]    Culture   Final    NO GROWTH 5 DAYS Performed at [facility], [address], [place], [place] [address]    Report Status 12/22/2023 FINAL  Final  Culture, blood (Routine X 2) w Reflex to ID Panel     Status: None   Collection Time: 12/17/23  6:51 PM   Specimen: BLOOD LEFT HAND  Result Value Ref Range Status   Specimen Description   Final    BLOOD LEFT HAND Performed at [facility], [address], [place], [place] [address]    Special Requests   Final    BOTTLES DRAWN AEROBIC ONLY Blood Culture results may not be optimal due to an inadequate volume of blood received in culture bottles Performed at [facility], [address] [place], [place] [address]    Culture   Final    NO GROWTH 5 DAYS Performed at [facility], [address] [place], [place] [address]    Report Status 12/22/2023 FINAL  Final         Radiology Studies: No results found.         LOS: 4 days   Time spent= 35 mins    Miguel Rota, MD [facility]  If 7PM-7AM, please contact night-coverage  12/22/2023, 11:18 AM

## 2023-12-22 NOTE — Plan of Care (Signed)

## 2023-12-22 NOTE — Progress Notes (Signed)
Ms. Mcgowin is doing okay.  She tolerated the unit of blood yesterday.  She is getting radiation therapy.  She is swallowing okay.  She is going to have something more solid today.  She has had no fever.  There is been no bleeding.  She has had no problems with bowels or bladder.  Her labs show a sodium 143.  Potassium 3.5.  BUN 6 creatinine 0.48.  Calcium is 7 with an albumin of 2.1.  Magnesium is 1.7.  Her white cell count 3.9.  Hemoglobin 10.2.  Platelet count is 321.  She has had no rashes.  Her vital signs are temperature 97.7.  Pulse 89.  Blood pressure 128/74.  Her head and neck exam shows no scleral icterus.  There is no oral mucositis.  There is no adenopathy in the neck.  Lungs are clear bilaterally.  She has good air movement bilaterally.  Cardiac exam regular rate and rhythm.  She has no murmurs.  Abdomen soft.  Bowel sounds are present.  There is no fluid wave.  There is no palpable liver or spleen tip.  Extremity shows no clubbing, cyanosis or edema.   Ms. Rombach is doing better.  She is not neutropenic.  I am sure that she had esophagitis likely from radiation or maybe even fungus.  Her immune system is better now.  She will continue the radiotherapy.  Glad that her potassium is better.  I think her calcium is also better.  It will be interesting to see how well she does with the chicken wings and [place] fries tonight.  I know that she is getting wonderful care from the staff up on [address].  I very much appreciate all of their help.  Christin Bach, MD

## 2023-12-23 ENCOUNTER — Other Ambulatory Visit: Payer: Self-pay

## 2023-12-23 ENCOUNTER — Ambulatory Visit
Admission: RE | Admit: 2023-12-23 | Discharge: 2023-12-23 | Discharge status: Home / Self Care | Payer: 59 | Source: Ambulatory Visit | Attending: Radiation Oncology

## 2023-12-23 ENCOUNTER — Ambulatory Visit: Payer: 59

## 2023-12-23 DIAGNOSIS — K209 Esophagitis, unspecified without bleeding: Secondary | ICD-10-CM | POA: Diagnosis not present

## 2023-12-23 LAB — CBC WITH DIFFERENTIAL/PLATELET
Abs Immature Granulocytes: 0.67 10*3/uL — ABNORMAL HIGH (ref 0.00–0.07)
Basophils Absolute: 0.1 10*3/uL (ref 0.0–0.1)
Basophils Relative: 1 %
Eosinophils Absolute: 0 10*3/uL (ref 0.0–0.5)
Eosinophils Relative: 0 %
HCT: 34.9 % — ABNORMAL LOW (ref 36.0–46.0)
Hemoglobin: 10.7 g/dL — ABNORMAL LOW (ref 12.0–15.0)
Immature Granulocytes: 15 %
Lymphocytes Relative: 15 %
Lymphs Abs: 0.7 10*3/uL (ref 0.7–4.0)
MCH: 27.1 pg (ref 26.0–34.0)
MCHC: 30.7 g/dL (ref 30.0–36.0)
MCV: 88.4 fL (ref 80.0–100.0)
Monocytes Absolute: 0.3 10*3/uL (ref 0.1–1.0)
Monocytes Relative: 7 %
Neutro Abs: 2.9 10*3/uL (ref 1.7–7.7)
Neutrophils Relative %: 62 %
Platelet Morphology: NORMAL
Platelets: 345 10*3/uL (ref 150–400)
RBC: 3.95 MIL/uL (ref 3.87–5.11)
RDW: 16.7 % — ABNORMAL HIGH (ref 11.5–15.5)
WBC: 4.6 10*3/uL (ref 4.0–10.5)
nRBC: 0.4 % — ABNORMAL HIGH (ref 0.0–0.2)

## 2023-12-23 LAB — RAD ONC ARIA SESSION SUMMARY
Course Elapsed Days: 21
Plan Fractions Treated to Date: 15
Plan Prescribed Dose Per Fraction: 2.5 Gy
Plan Total Fractions Prescribed: 15
Plan Total Prescribed Dose: 37.5 Gy
Reference Point Dosage Given to Date: 37.5 Gy
Reference Point Session Dosage Given: 2.5 Gy
Session Number: 15

## 2023-12-23 LAB — COMPREHENSIVE METABOLIC PANEL
ALT: 10 U/L (ref 0–44)
AST: 14 U/L — ABNORMAL LOW (ref 15–41)
Albumin: 2.2 g/dL — ABNORMAL LOW (ref 3.5–5.0)
Alkaline Phosphatase: 69 U/L (ref 38–126)
Anion gap: 7 (ref 5–15)
BUN: 9 mg/dL (ref 6–20)
CO2: 28 mmol/L (ref 22–32)
Calcium: 6.8 mg/dL — ABNORMAL LOW (ref 8.9–10.3)
Chloride: 106 mmol/L (ref 98–111)
Creatinine, Ser: 0.63 mg/dL (ref 0.44–1.00)
GFR, Estimated: >60 mL/min (ref >60)
Glucose, Bld: 111 mg/dL — ABNORMAL HIGH (ref 70–99)
Potassium: 3.9 mmol/L (ref 3.5–5.1)
Sodium: 141 mmol/L (ref 135–145)
Total Bilirubin: 0.2 mg/dL (ref 0.0–1.2)
Total Protein: 7.4 g/dL (ref 6.5–8.1)

## 2023-12-23 LAB — MAGNESIUM: Magnesium: 1.8 mg/dL (ref 1.7–2.4)

## 2023-12-23 LAB — CALCIUM, IONIZED: Calcium, Ionized, Serum: 4.1 mg/dL — ABNORMAL LOW (ref 4.5–5.6)

## 2023-12-23 MED ORDER — CALCIUM GLUCONATE-NACL 2-0.675 GM/100ML-% IV SOLN
2.0000 g | Freq: Once | INTRAVENOUS | Status: AC
  Administered 2023-12-23: 2000 mg via INTRAVENOUS
  Filled 2023-12-23: qty 100

## 2023-12-23 MED ORDER — SUCRALFATE 1 GM/10ML PO SUSP
1.0000 g | Freq: Three times a day (TID) | ORAL | Status: DC
  Administered 2023-12-23 – 2023-12-24 (×5): 1 g via ORAL
  Filled 2023-12-23 (×5): qty 10

## 2023-12-23 MED ORDER — PHENOL 1.4 % MT LIQD: 1.0000 | OROMUCOSAL | Status: DC | PRN

## 2023-12-23 NOTE — Progress Notes (Signed)
So far, Tammy Cordova is doing okay.  She has 1 more radiation therapy after today.  She is swallowing a little better.  She has had no problems with nausea or vomiting.  She is out of bed.  Her labs show sodium of 141.  Potassium 3.9.  BUN 9 creatinine 0.63.  Calcium 6.8 with an albumin of 2.2.  As such, her calcium corrected is relatively normal.  She did have iron deficiency.  She did get some IV iron.  Her white cell count is 4.6.  Hemoglobin 10.7.  Platelet count 345,000.  She is not complain of any cough.  She has had no mouth sores.  There is been no problems with bowels or bladder.  She has had no obvious bleeding.  She is afebrile.  Temperature 98.2.  Pulse 90.  Blood pressure 110/66.  Her lungs sound clear bilaterally.  She has good air movement bilaterally.  Cardiac exam regular rate and rhythm.  Abdomen soft.  Bowel sounds are active.  There is no fluid wave.  There is no palpable liver or spleen tip.  Extremity shows no clubbing, cyanosis or edema.  Neurological exam is nonfocal.   Hopefully, Tammy Cordova will finish up her radiation tomorrow.  I am sure that the esophagitis is improved.  I know she was given some medicine for this.  For right now, I know she is getting incredible care from everybody upon [address].   Christin Bach, MD  Psalm 41:3

## 2023-12-23 NOTE — Plan of Care (Signed)
  Problem: Clinical Measurements: Goal: Will remain free from infection Outcome: Progressing Goal: Diagnostic test results will improve Outcome: Progressing   Problem: Activity: Goal: Risk for activity intolerance will decrease Outcome: Progressing   

## 2023-12-23 NOTE — Progress Notes (Signed)
PROGRESS NOTE    Tammy Cordova  WGN:[id number] DOB: [birth year] DOA: 12/17/2023 PCP: Ollen Bowl, MD    Brief Narrative:  58 year old with history of COPD, HTN, RA, recently diagnosed with invasive small cell carcinoma on chemotherapy and radiation admitted to the hospital for severe dysphagia, failure to thrive and hypocalcemia.  Oncology team has been consulted.  During the hospitalization started on aggressive IV fluids, aggressive electrolyte repletion as well.  Patient is getting radiation treatment while in the hospital.  Discussed with oncology and they are wanting to complete patient's radiation treatment in the hospital, 1 more left after today.   Assessment & Plan:  Principal Problem:   Esophagitis, acute    Severe esophagitis, likely radiation-induced - Admitting provider discussed case with [facility] who recommended Magic mouthwash and barium swallow.  Supportive care, stop Diflucan. -PPI   Neutropenic fever - After Neupogen neutropenia has improved.  Will discontinue precautions, stop antibiotics.  No obvious evidence of infection noted  Abnormal Breath sounds Additional Lasix one-time, Nebs, IS/Flutter   Hypocalcemia/ HypoK Aggressive repletion   Small cell lung cancer -Management per oncology team.  1 more radiation treatment after today per Dr. Myna Hidalgo  Failure to thrive -  dietitian  Anemia of chronic disease, iron deficiency - IV iron while here, will transition to p.o. upon discharge - Hemoglobin proved after transfusion by oncology  Vitamin D deficiency Folic acid deficiency - Supplements ordered   DVT prophylaxis: Lovenox   Code Status: Full Code Family Communication:   A.m. labs pending.  1 more treatment left after today per Dr. Myna Hidalgo he would like her to complete this in the hospital.   Subjective: Seen at bedside, sitting up in the chair.  Does not have any complaints. A.m. labs are pending  Examination:  General exam: Appears  calm and comfortable  Respiratory system: Clear to auscultation. Respiratory effort normal. Cardiovascular system: S1 & S2 heard, RRR. No JVD, murmurs, rubs, gallops or clicks. No pedal edema. Gastrointestinal system: Abdomen is nondistended, soft and nontender. No organomegaly or masses felt. Normal bowel sounds heard. Central nervous system: Alert and oriented. No focal neurological deficits. Extremities: Symmetric 5 x 5 power. Skin: No rashes, lesions or ulcers Psychiatry: Judgement and insight appear normal. Mood & affect appropriate. Chemo-Port in place           Pressure Injury 12/17/23 Ischial tuberosity Left;Posterior Stage 2 -  Partial thickness loss of dermis presenting as a shallow open injury with a red, pink wound bed without slough. round pressure injury (Active)  12/17/23 1800  Location: Ischial tuberosity  Location Orientation: Left;Posterior  Staging: Stage 2 -  Partial thickness loss of dermis presenting as a shallow open injury with a red, pink wound bed without slough.  Wound Description (Comments): round pressure injury  Present on Admission: Yes     Pressure Injury 12/17/23 Coccyx Posterior;Upper;Left Stage 2 -  Partial thickness loss of dermis presenting as a shallow open injury with a red, pink wound bed without slough. bilateral side. quarter size pressure injury b/w buttocks (Active)  12/17/23 1800  Location: Coccyx  Location Orientation: Posterior;Upper;Left  Staging: Stage 2 -  Partial thickness loss of dermis presenting as a shallow open injury with a red, pink wound bed without slough.  Wound Description (Comments): bilateral side. quarter size pressure injury b/w buttocks  Present on Admission: Yes     Diet Orders (From admission, onward)     Start     Ordered   12/17/23 1734  Diet regular Room service appropriate? Yes; Fluid consistency: Thin  Diet effective now       Question Answer Comment  Room service appropriate? Yes   Fluid consistency:  Thin      12/17/23 1744            Objective: Vitals:   12/22/23 0755 12/22/23 1459 12/22/23 2044 12/23/23 0419  BP:  133/77 102/67 133/72  Pulse:  83 89 90  Resp:  15 18 18   Temp:  98.2 F (36.8 C) 97.8 F (36.6 C) 98.4 F (36.9 C)  TempSrc:  Oral Oral Oral  SpO2: 96% 98% 97% 97%  Weight:      Height:       No intake or output data in the 24 hours ending 12/23/23 0859 Filed Weights   12/17/23 1521  Weight: 62.6 kg    Scheduled Meds:  budesonide (PULMICORT) nebulizer solution  0.5 mg Nebulization BID   Chlorhexidine Gluconate Cloth  6 each Topical Daily   dronabinol  2.5 mg Oral BID AC   enoxaparin (LOVENOX) injection  40 mg Subcutaneous Q24H   feeding supplement  237 mL Oral BID BM   folic acid  1 mg Oral Daily   ipratropium-albuterol  3 mL Nebulization BID   magic mouthwash w/lidocaine  15 mL Oral QID   pantoprazole  40 mg Oral BID AC   sodium chloride flush  10-40 mL Intracatheter Q12H   Vitamin D (Ergocalciferol)  50,000 Units Oral Q7 days   Continuous Infusions:  promethazine (PHENERGAN) injection (IM or IVPB) 150 mL/hr at 12/19/23 0313    Nutritional status Signs/Symptoms: mild fat depletion, mild muscle depletion, energy intake < 75% for > 7 days, percent weight loss Interventions: Ensure Enlive (each supplement provides 350kcal and 20 grams of protein) Body mass index is 22.27 kg/m.  Data Reviewed:   CBC: Recent Labs  Lab 12/18/23 0508 12/19/23 0531 12/20/23 0504 12/21/23 0456 12/21/23 1700 12/22/23 0531  WBC 3.5* 7.0 5.4 4.3  --  3.9*  NEUTROABS 2.6 5.0 3.6 2.5  --  2.3  HGB 8.8* 8.5* 8.2* 8.4* 9.7* 10.2*  HCT 28.6* 28.0* 26.9* 26.7* 31.1* 33.2*  MCV 88.5 89.7 87.3 85.0  --  87.6  PLT 181 235 285 298  --  321   Basic Metabolic Panel: Recent Labs  Lab 12/18/23 1800 12/19/23 0531 12/20/23 0504 12/21/23 0456 12/22/23 0531 12/23/23 0526  NA 145 142 145 141 143  --   K 4.2 3.8 3.1* 2.9* 3.5  --   CL 118* 116* 115* 111 108  --    CO2 20* 21* 25 27 28   --   GLUCOSE 85 87 96 105* 104*  --   BUN 9 8 <5* <5* 6  --   CREATININE 0.69 0.77 0.77 0.53 0.48  --   CALCIUM 6.2* 7.1* 6.8* 6.7* 7.0*  --   MG  --  1.8 1.7 1.7 1.7 1.8  PHOS  --  2.6  --   --  2.6  --    GFR: Estimated Creatinine Clearance: 73.5 mL/min (by C-G formula based on SCr of 0.48 mg/dL). Liver Function Tests: Recent Labs  Lab 12/18/23 0700 12/19/23 0531 12/20/23 0504 12/21/23 0456 12/22/23 0531  AST 13* 13* 11* 12* 13*  ALT 11 11 9 9 9   ALKPHOS 55 59 57 54 63  BILITOT 0.4 0.3 0.4 0.3 0.3  PROT 6.5 6.4* 6.4* 6.5 6.9  ALBUMIN 2.0* 2.0* 1.8* 2.0* 2.1*   No results for input(s): "  LIPASE", "AMYLASE" in the last 168 hours. No results for input(s): "AMMONIA" in the last 168 hours. Coagulation Profile: No results for input(s): "INR", "PROTIME" in the last 168 hours. Cardiac Enzymes: No results for input(s): "CKTOTAL", "CKMB", "CKMBINDEX", "TROPONINI" in the last 168 hours. BNP (last 3 results) No results for input(s): "PROBNP" in the last 8760 hours. HbA1C: No results for input(s): "HGBA1C" in the last 72 hours. CBG: No results for input(s): "GLUCAP" in the last 168 hours. Lipid Profile: No results for input(s): "CHOL", "HDL", "LDLCALC", "TRIG", "CHOLHDL", "LDLDIRECT" in the last 72 hours. Thyroid Function Tests: No results for input(s): "TSH", "T4TOTAL", "FREET4", "T3FREE", "THYROIDAB" in the last 72 hours. Anemia Panel: No results for input(s): "VITAMINB12", "FOLATE", "FERRITIN", "TIBC", "IRON", "RETICCTPCT" in the last 72 hours. Sepsis Labs: Recent Labs  Lab 12/17/23 1805  LATICACIDVEN 1.2    Recent Results (from the past 240 hours)  Culture, blood (Routine X 2) w Reflex to ID Panel     Status: None   Collection Time: 12/17/23  5:40 PM   Specimen: BLOOD  Result Value Ref Range Status   Specimen Description   Final    BLOOD SITE NOT SPECIFIED Performed at [facility], [address] [place], [place]  [address]    Special Requests   Final    BOTTLES DRAWN AEROBIC AND ANAEROBIC Blood Culture adequate volume Performed at [facility], [address] [place], [place] [address]    Culture   Final    NO GROWTH 5 DAYS Performed at [facility], [address] [place], [place] [address]    Report Status 12/22/2023 FINAL  Final  Culture, blood (Routine X 2) w Reflex to ID Panel     Status: None   Collection Time: 12/17/23  6:51 PM   Specimen: BLOOD LEFT HAND  Result Value Ref Range Status   Specimen Description   Final    BLOOD LEFT HAND Performed at [facility], [address], [place], [place] [address]    Special Requests   Final    BOTTLES DRAWN AEROBIC ONLY Blood Culture results may not be optimal due to an inadequate volume of blood received in culture bottles Performed at [facility], [address], [place], [place] [address]    Culture   Final    NO GROWTH 5 DAYS Performed at [facility], [address] [place], [place] [address]    Report Status 12/22/2023 FINAL  Final         Radiology Studies: No results found.         LOS: 5 days   Time spent= 35 mins    Miguel Rota, MD [facility]  If 7PM-7AM, please contact night-coverage  12/23/2023, 8:59 AM

## 2023-12-23 NOTE — Progress Notes (Signed)
Mobility Specialist - Progress Note   12/23/23 1017  Mobility  Activity Ambulated independently in hallway  Level of Assistance Independent  Assistive Device None  Distance Ambulated (ft) 500 ft  Activity Response Tolerated well  Mobility Referral Yes  Mobility visit 1 Mobility  Mobility Specialist Start Time (ACUTE ONLY) 1007  Mobility Specialist Stop Time (ACUTE ONLY) 1013  Mobility Specialist Time Calculation (min) (ACUTE ONLY) 6 min   Pt received in recliner and agreeable to mobility. No complaints during session. Pt to recliner after session with all needs met.    [facility]

## 2023-12-23 NOTE — Plan of Care (Signed)

## 2023-12-24 DIAGNOSIS — K209 Esophagitis, unspecified without bleeding: Secondary | ICD-10-CM | POA: Diagnosis not present

## 2023-12-24 LAB — CBC WITH DIFFERENTIAL/PLATELET
Abs Immature Granulocytes: 0.61 10*3/uL — ABNORMAL HIGH (ref 0.00–0.07)
Basophils Absolute: 0.1 10*3/uL (ref 0.0–0.1)
Basophils Relative: 1 %
Eosinophils Absolute: 0 10*3/uL (ref 0.0–0.5)
Eosinophils Relative: 0 %
HCT: 32.4 % — ABNORMAL LOW (ref 36.0–46.0)
Hemoglobin: 10 g/dL — ABNORMAL LOW (ref 12.0–15.0)
Immature Granulocytes: 14 %
Lymphocytes Relative: 12 %
Lymphs Abs: 0.5 10*3/uL — ABNORMAL LOW (ref 0.7–4.0)
MCH: 27.3 pg (ref 26.0–34.0)
MCHC: 30.9 g/dL (ref 30.0–36.0)
MCV: 88.5 fL (ref 80.0–100.0)
Monocytes Absolute: 0.4 10*3/uL (ref 0.1–1.0)
Monocytes Relative: 10 %
Neutro Abs: 2.6 10*3/uL (ref 1.7–7.7)
Neutrophils Relative %: 63 %
Platelet Morphology: NORMAL
Platelets: 326 10*3/uL (ref 150–400)
RBC: 3.66 MIL/uL — ABNORMAL LOW (ref 3.87–5.11)
RDW: 16.8 % — ABNORMAL HIGH (ref 11.5–15.5)
WBC: 4.2 10*3/uL (ref 4.0–10.5)
nRBC: 0.5 % — ABNORMAL HIGH (ref 0.0–0.2)

## 2023-12-24 LAB — COMPREHENSIVE METABOLIC PANEL
ALT: 9 U/L (ref 0–44)
AST: 18 U/L (ref 15–41)
Albumin: 2.2 g/dL — ABNORMAL LOW (ref 3.5–5.0)
Alkaline Phosphatase: 68 U/L (ref 38–126)
Anion gap: 8 (ref 5–15)
BUN: 8 mg/dL (ref 6–20)
CO2: 26 mmol/L (ref 22–32)
Calcium: 7.4 mg/dL — ABNORMAL LOW (ref 8.9–10.3)
Chloride: 106 mmol/L (ref 98–111)
Creatinine, Ser: 0.44 mg/dL (ref 0.44–1.00)
GFR, Estimated: >60 mL/min (ref >60)
Glucose, Bld: 106 mg/dL — ABNORMAL HIGH (ref 70–99)
Potassium: 4 mmol/L (ref 3.5–5.1)
Sodium: 140 mmol/L (ref 135–145)
Total Bilirubin: 0.5 mg/dL (ref 0.0–1.2)
Total Protein: 6.9 g/dL (ref 6.5–8.1)

## 2023-12-24 LAB — PARATHYROID HORMONE, INTACT (NO CA): PTH: 58 pg/mL (ref 15–65)

## 2023-12-24 LAB — MAGNESIUM: Magnesium: 1.8 mg/dL (ref 1.7–2.4)

## 2023-12-24 MED ORDER — HEPARIN SOD (PORK) LOCK FLUSH 100 UNIT/ML IV SOLN
500.0000 [IU] | Freq: Once | INTRAVENOUS | Status: AC
  Administered 2023-12-24: 500 [IU] via INTRAVENOUS
  Filled 2023-12-24: qty 5

## 2023-12-24 MED ORDER — HYDROCODONE-ACETAMINOPHEN 7.5-325 MG/15ML PO SOLN
10.0000 mL | ORAL | Status: DC | PRN
  Administered 2023-12-24: 10 mL via ORAL
  Filled 2023-12-24: qty 15

## 2023-12-24 MED ORDER — FOLIC ACID 1 MG PO TABS: 1.0000 mg | ORAL_TABLET | Freq: Every day | ORAL | 3 refills | Status: DC

## 2023-12-24 MED ORDER — SUCRALFATE 1 GM/10ML PO SUSP: 1.0000 g | Freq: Three times a day (TID) | ORAL | 0 refills | Status: DC

## 2023-12-24 MED ORDER — HYDROCODONE-ACETAMINOPHEN 5-325 MG PO TABS: 1.0000 | ORAL_TABLET | Freq: Four times a day (QID) | ORAL | 0 refills | Status: DC | PRN

## 2023-12-24 MED ORDER — EQ SENNA-S 8.6-50 MG PO TABS: 1.0000 | ORAL_TABLET | Freq: Two times a day (BID) | ORAL | 0 refills | Status: AC | PRN

## 2023-12-24 MED ORDER — VITAMIN D (ERGOCALCIFEROL) 1.25 MG (50000 UNIT) PO CAPS: 50000.0000 [IU] | ORAL_CAPSULE | ORAL | 2 refills | Status: DC

## 2023-12-24 NOTE — Radiation Completion Notes (Signed)
  Radiation Oncology         [phone] ________________________________  Name: Tammy Cordova MRN: [id number]  Date of Service: 12/23/2023  DOB: December 29, 1965  End of Treatment Note     Diagnosis: Extensive Stage Small Cell Carcinoma of the RUL  Intent: Palliative     ==========DELIVERED PLANS==========  First Treatment Date: 2023-12-02 Last Treatment Date: 2023-12-23   Plan Name: Lung_R Site: Lung, RUL Technique: 3D Mode: Photon Dose Per Fraction: 2.5 Gy Prescribed Dose (Delivered / Prescribed): 37.5 Gy / 37.5 Gy Prescribed Fxs (Delivered / Prescribed): 15 / 15     ==========ON TREATMENT VISIT DATES========== 2023-12-08, 2023-12-10, 2023-12-21    See weekly On Treatment Notes in Epic for details in the Media tab (listed as Progress notes on the On Treatment Visit Dates listed above). The patient tolerated radiation. She developed fatigue and anticipated skin changes in the treatment field.   The patient will receive a call in about one month from the radiation oncology department. She will continue follow up with Dr. Myna Hidalgo as well. We will check in with her about possible PCI after she completes systemic therapy.      Osker Mason, PAC

## 2023-12-24 NOTE — Progress Notes (Signed)
She has completed the radiotherapy.  She is doing well with this.  Hopefully, she will be able to go home today.  I am not sure why labs have not been drawn yet this morning.  We really need to have the lab work so we can make sure everything is okay for her to go home..  She still has little bit of a sore throat.  She is still swallowing.  She is eating okay.  She has had no fever.  There is no diarrhea.  There is no bleeding.  She has had a little bit of a cough.  She is wearing oxygen.  I am unsure why she is wear the oxygen although she says it does make her feel a little bit better.  Her vital signs show temperature 98.5.  Pulse 89.  Blood pressure 138/82.  Her lungs sound clear bilaterally.  She has good air movement bilaterally.  Cardiac exam regular rate and rhythm.  She has no murmurs.  Oral exam does not show any mucositis.  Abdominal exam is soft.  She has good bowel sounds.  There is no fluid wave.  There is no palpable liver or spleen tip.  Extremity shows no clubbing, cyanosis or edema.  Neurological exam shows no focal neurological deficits.  Again, I would think she should be able to go home.  She definitely needs to have the Magic mouthwash with lidocaine.  I suspect that she may also need to have liquid Vicodin to go home with.  I think that she is probably due for her next cycle of chemotherapy in about a week or so.  Our office will give her a call.  Again, I do not know why labs were not drawn this morning.  She really needs to have these done if she is to go home today.   Christin Bach, MD  Jeri Modena 29:11

## 2023-12-24 NOTE — Plan of Care (Signed)
Problem: Clinical Measurements: Goal: Respiratory complications will improve Outcome: Progressing   Problem: Activity: Goal: Risk for activity intolerance will decrease Outcome: Progressing   Problem: Safety: Goal: Ability to remain free from injury will improve Outcome: Progressing

## 2023-12-24 NOTE — Progress Notes (Signed)
Pt given and educated on meds with AVS.  Spouse at [facility].  PORT deaccessed per policy.  Pt does not need oxygen at this time. Ambulating room air 92%.

## 2023-12-24 NOTE — Discharge Summary (Signed)
Physician Discharge Summary  Tammy Cordova GNF:[id number] DOB: 1966/05/09 DOA: 12/17/2023  PCP: Tammy Bowl, MD  Admit date: 12/17/2023 Discharge date: 12/24/2023  Admitted From: Home Disposition: Home  Recommendations for Outpatient Follow-up:  Follow up with PCP in 1-2 weeks Follow-up with oncology as scheduled  Home Health: None Equipment/Devices: None  Discharge Condition: Stable CODE STATUS: Full Diet recommendation: Regular diet as tolerated  Brief/Interim Summary: 58 year old with history of COPD, HTN, RA, recently diagnosed with invasive small cell carcinoma on chemotherapy and radiation admitted to the hospital for severe dysphagia, failure to thrive and hypocalcemia. Oncology team has been consulted. During the hospitalization started on aggressive IV fluids, aggressive electrolyte repletion as well. Patient is getting radiation treatment while in the hospital. Discussed with oncology and they are wanting to complete patient's radiation treatment in the hospital -patient completed therapy 12/24/2023 and is otherwise stable and agreeable for discharge home.  Outpatient follow-up with PCP, oncology, radiation oncology as scheduled.  Medication changes outlined as below  Discharge Diagnoses:  Principal Problem:   Esophagitis, acute Active Problems:   Hypokalemia   Malnutrition of moderate degree    Discharge Instructions   Allergies as of 12/24/2023   No Known Allergies      Medication List     STOP taking these medications    calcium-vitamin D 500-5 MG-MCG tablet Commonly known as: OSCAL WITH D   valsartan 320 MG tablet Commonly known as: DIOVAN       TAKE these medications    albuterol 108 (90 Base) MCG/ACT inhaler Commonly known as: VENTOLIN HFA Inhale 2 puffs into the lungs every 6 (six) hours as needed for wheezing or shortness of breath.   dronabinol 2.5 MG capsule Commonly known as: [name] Take 1 capsule (2.5 mg total) by mouth 2 (two)  times daily before lunch and supper.   EQ Senna-S 8.6-50 MG tablet Generic drug: senna-docusate Take 1 tablet by mouth 2 (two) times daily as needed for mild constipation.   folic acid 1 MG tablet Commonly known as: FOLVITE Take 1 tablet (1 mg total) by mouth daily.   HYDROcodone-acetaminophen 5-325 MG tablet Commonly known as: NORCO/VICODIN Take 1 tablet by mouth every 6 (six) hours as needed for moderate pain (pain score 4-6).   pantoprazole 40 MG tablet Commonly known as: PROTONIX Take 40 mg by mouth every morning.   sucralfate 1 GM/10ML suspension Commonly known as: CARAFATE Take 10 mLs (1 g total) by mouth 4 (four) times daily -  with meals and at bedtime.   Vitamin D (Ergocalciferol) 1.25 MG (50000 UNIT) Caps capsule Commonly known as: DRISDOL Take 1 capsule (50,000 Units total) by mouth every 7 (seven) days. Start taking on: December 26, 2023        No Known Allergies  Consultations: Oncology, radiation oncology  Procedures/Studies: DG Chest Port 1 View Result Date: 12/17/2023 CLINICAL DATA:  Cough.  History of non-small cell lung cancer. EXAM: PORTABLE CHEST 1 VIEW COMPARISON:  12/02/2023 FINDINGS: Left chest wall port a catheter is identified with tip at the superior cavoatrial junction. Stable cardiomediastinal contours. Chronic interstitial coarsening compatible with emphysema. Mild diffuse increase interstitial prominence is noted bilaterally. Increased right paratracheal opacity is identified over the medial right apex compatible with patient's known malignancy. The visualized osseous structures are unremarkable. IMPRESSION: 1. Mild diffuse increase interstitial prominence bilaterally. Differential considerations include mild pulmonary edema versus atypical infection. 2. Increased right paratracheal opacity compatible with patient's known malignancy. 3. Chronic changes of COPD/emphysema. Electronically Signed  By: Signa Kell M.D.   On: 12/17/2023 18:22   IR  IMAGING GUIDED PORT INSERTION Result Date: 12/03/2023 CLINICAL DATA:  Worsening symptomatic small cell carcinoma of the right lung with need for emergent chemotherapy. EXAM: IMPLANTED PORT A CATH PLACEMENT WITH ULTRASOUND AND FLUOROSCOPIC GUIDANCE ANESTHESIA/SEDATION: Moderate (conscious) sedation was employed during this procedure. A total of Versed 1.0 mg and Fentanyl 100 mcg was administered intravenously. Moderate Sedation Time: 36 minutes. The patient's level of consciousness and vital signs were monitored continuously by radiology nursing throughout the procedure under my direct supervision. FLUOROSCOPY: 30 seconds.  1.0 mGy. PROCEDURE: The procedure, risks, benefits, and alternatives were explained to the patient. Questions regarding the procedure were encouraged and answered. The patient understands and consents to the procedure. A time-out was performed prior to initiating the procedure. Ultrasound was utilized to confirm patency of the left internal jugular vein. Under ultrasound image was saved and recorded. The left neck and chest were prepped with chlorhexidine in a sterile fashion, and a sterile drape was applied covering the operative field. Maximum barrier sterile technique with sterile gowns and gloves were used for the procedure. Local anesthesia was provided with 1% lidocaine. After creating a small venotomy incision, a 21 gauge needle was advanced into the left internal jugular vein under direct, real-time ultrasound guidance. Ultrasound image documentation was performed. After securing guidewire access, an 8 Fr dilator was placed. A J-wire was kinked to measure appropriate catheter length. A subcutaneous port pocket was then created along the upper chest wall utilizing sharp and blunt dissection. Portable cautery was utilized. The pocket was irrigated with sterile saline. A single lumen power injectable port was chosen for placement. The 8 Fr catheter was tunneled from the port pocket site to  the venotomy incision. The port was placed in the pocket. External catheter was trimmed to appropriate length based on guidewire measurement. At the venotomy, an 8 Fr peel-away sheath was placed over a guidewire. The catheter was then placed through the sheath and the sheath removed. Final catheter positioning was confirmed and documented with a fluoroscopic spot image. The port was accessed with a needle and aspirated and flushed with heparinized saline. The access needle was left in place. The venotomy and port pocket incisions were closed with subcutaneous 3-0 Monocryl and subcuticular 4-0 Vicryl. Dermabond was applied to both incisions. COMPLICATIONS: COMPLICATIONS None FINDINGS: After catheter placement, the tip lies at the cavo-atrial junction. The catheter aspirates normally and is ready for immediate use. IMPRESSION: Placement of single lumen port a cath via left internal jugular vein. The catheter tip lies at the cavo-atrial junction. A power injectable port a cath was placed and is ready for immediate use. Electronically Signed   By: Irish Lack M.D.   On: 12/03/2023 15:23   CT Angio Chest PE W and/or Wo Contrast Result Date: 12/02/2023 CLINICAL DATA:  Non-small cell lung cancer. Worsening shortness of breath and weakness. * Tracking Code: BO * EXAM: CT ANGIOGRAPHY CHEST WITH CONTRAST TECHNIQUE: Multidetector CT imaging of the chest was performed using the standard protocol during bolus administration of intravenous contrast. Multiplanar CT image reconstructions and MIPs were obtained to evaluate the vascular anatomy. RADIATION DOSE REDUCTION: This exam was performed according to the departmental dose-optimization program which includes automated exposure control, adjustment of the mA and/or kV according to patient size and/or use of iterative reconstruction technique. CONTRAST:  75mL OMNIPAQUE IOHEXOL 350 MG/ML SOLN COMPARISON:  PET-CT 11/30/2023 and CT chest 11/25/2023. FINDINGS: Cardiovascular: No  filling defect  is identified in the pulmonary arterial tree to suggest pulmonary embolus. Continued prominence of the main pulmonary artery compatible pulmonary arterial hypertension. Mediastinum/Nodes: Stable upper right mediastinal invasion by the right upper lobe mass. Stable 2.4 cm right supraclavicular node/mass, partially extending into level V of the lower neck. Stable bilateral axillary lymph nodes. 1.5 cm left thyroid nodule, previously hypermetabolic. Recommend thyroid US and biopsy (ref: J Am Coll Radiol. 2015 Feb;12(2): 143-50). Lungs/Pleura: Stable right upper lobe mass partially surrounding the right subclavian artery and invading the mediastinum, about 8 cm in long axis on image 33 series 4, and mildly flattening the right posterior tracheal margin. Leftward deviation of the trachea as before. Severe emphysema. Right suprahilar nodularity tangential to the mass. Stable pleural nodularity along the lateral margin of the left major fissure on image 94 series 12, previously hypermetabolic, compatible with malignancy. Upper Abdomen: Unremarkable Musculoskeletal: Mild thoracic spondylosis. Review of the MIP images confirms the above findings. IMPRESSION: 1. No filling defect is identified in the pulmonary arterial tree to suggest pulmonary embolus. 2. Stable right upper lobe mass with mediastinal invasion and leftward deviation of the trachea. 3. Stable 2.4 cm right supraclavicular node/mass. 4. Stable pleural nodularity along the lateral margin of the left major fissure, previously hypermetabolic, compatible with malignancy. 5. Severe emphysema. 6. Continued prominence of the main pulmonary artery compatible with pulmonary arterial hypertension. 7. 1.5 cm left thyroid nodule, previously hypermetabolic. Recommend thyroid US and biopsy. Emphysema (ICD10-J43.9). Electronically Signed   By: Gaylyn Rong M.D.   On: 12/02/2023 18:24   DG Chest 2 View Result Date: 12/02/2023 CLINICAL DATA:  Shortness of  breath. EXAM: CHEST - 2 VIEW COMPARISON:  December 20, 2021. FINDINGS: The heart size and mediastinal contours are within normal limits. Minimal bibasilar subsegmental atelectasis is noted. The visualized skeletal structures are unremarkable. IMPRESSION: Minimal bibasilar subsegmental atelectasis. Electronically Signed   By: Lupita Raider M.D.   On: 12/02/2023 10:19   NM PET Image Initial (PI) Skull Base To Thigh Result Date: 12/01/2023 CLINICAL DATA:  Initial treatment strategy for non-small-cell lung cancer. EXAM: NUCLEAR MEDICINE PET SKULL BASE TO THIGH TECHNIQUE: 7.7 mCi F-18 FDG was injected intravenously. Full-ring PET imaging was performed from the skull base to thigh after the radiotracer. CT data was obtained and used for attenuation correction and anatomic localization. Fasting blood glucose: 117 mg/dl COMPARISON:  Abdomen and pelvis CT 11/27/2023.  Chest CT 11/25/2023 FINDINGS: Mediastinal blood pool activity: SUV max 2.4 Liver activity: SUV max NA NECK: Tiny lymph nodes in the right lower neck show low level uptake, potentially metastatic. Incidental CT findings: None. CHEST: Medial right upper lung mass is markedly hypermetabolic with SUV max = 9.3 with associated hypermetabolic central nodularity in the right suprahilar lung. 2.4 cm short axis right supraclavicular lymphadenopathy is hypermetabolic with SUV max = 6.9. Focal uptake identified along the posterior aspect of the inferior left thyroid lobe. Noncontrast CT imaging shows a an apparent 15 mm posterior left thyroid nodule on 47/4. SUV max = 6.7. Hypermetabolic mediastinal and hilar lymph nodes evident. Small lymph nodes in the axillary regions bilaterally are FDG avid with SUV max = 5.6 on the left and 5.1 on the right. 12 mm subpleural lower left lung nodule on 89/4 is hypermetabolic with SUV max = 7.5. Incidental CT findings: Enlargement of the pulmonary outflow tract and main pulmonary arteries suggests pulmonary arterial hypertension.  Mild atherosclerotic calcification is noted in the wall of the thoracic aorta. Centrilobular and paraseptal emphysema evident. ABDOMEN/PELVIS:  No abnormal hypermetabolic activity within the liver, pancreas, adrenal glands, or spleen. No hypermetabolic lymph nodes in the abdomen. Bilateral external iliac and groin lymphadenopathy noted. A 12 mm short axis left external iliac node on 173/4 is hypermetabolic with SUV max = 5.2. 13 mm short axis right external iliac node on 174/4 is hypermetabolic with SUV max = 4.6. Lymph nodes in the groin regions show low level hypermetabolism ( SUV max = 3.0). Incidental CT findings: None. SKELETON: Radiotracer accumulation in the marrow space is diffusely mottled. No definite bony metastatic involvement. 1 particular focus of uptake in the posterior right iliac crest demonstrates SUV max = 4.7 with no underlying CT abnormality (corresponding to image 149/4). Incidental CT findings: None. IMPRESSION: 1. Markedly hypermetabolic medial right upper lung mass with associated hypermetabolic central nodularity in the right suprahilar lung. Imaging features compatible with primary bronchogenic carcinoma. 2. Hypermetabolic right supraclavicular, mediastinal, hilar, and axillary lymphadenopathy consistent with metastatic disease. Hypermetabolic external iliac lymphadenopathy identified bilaterally in the pelvis with mildly hypermetabolic groin lymphadenopathy. Metastatic disease not excluded. 3. 12 mm subpleural lower left lung nodule is hypermetabolic and compatible with metastatic disease or synchronous primary neoplasm. 4. Hypermetabolic 15 mm posterior left thyroid nodule on noncontrast CT imaging. Recommend thyroid US and biopsy (ref: J Am Coll Radiol. 2015 Feb;12(2): 143-50). 5. Enlargement of the pulmonary outflow tract and main pulmonary arteries suggests pulmonary arterial hypertension. 6. Aortic Atherosclerosis (ICD10-I70.0) and Emphysema (ICD10-J43.9). Electronically Signed   By:  Kennith Center M.D.   On: 12/01/2023 09:44   CT ABDOMEN PELVIS W CONTRAST Result Date: 11/27/2023 CLINICAL DATA:  Non-small cell lung cancer (NSCLC), Staging for lung cancer * Tracking Code: BO * EXAM: CT ABDOMEN AND PELVIS WITH CONTRAST TECHNIQUE: Multidetector CT imaging of the abdomen and pelvis was performed using the standard protocol following bolus administration of intravenous contrast. RADIATION DOSE REDUCTION: This exam was performed according to the departmental dose-optimization program which includes automated exposure control, adjustment of the mA and/or kV according to patient size and/or use of iterative reconstruction technique. CONTRAST:  OMNIPAQUE IOHEXOL 300 MG/ML  SOLN COMPARISON:  None Available. FINDINGS: Lower chest: There are emphysematous changes in the visualized bilateral lungs. There also patchy areas of atelectasis/scarring. No mass or consolidation. No pleural or pericardial effusion. Normal cardiac size. Hepatobiliary: The liver is normal in size. Non-cirrhotic configuration. No suspicious mass. These is mild diffuse hepatic steatosis. No intrahepatic or extrahepatic bile duct dilation. No calcified gallstones. Normal gallbladder wall thickness. No pericholecystic inflammatory changes. Pancreas: Unremarkable. No pancreatic ductal dilatation or surrounding inflammatory changes. Spleen: Within normal limits. No focal lesion. Adrenals/Urinary Tract: There is an indeterminate 8 x 10 mm right adrenal nodule, which can not be characterized as an adenoma. However, this nodule was present on the prior study from 07/27/2019. Unremarkable left adrenal gland. No suspicious renal mass. There is a subcentimeter sized hypoattenuating structure in the right kidney interpolar region, laterally, which is too small to adequately characterize. No hydronephrosis. No renal or ureteric calculi. Unremarkable urinary bladder. Stomach/Bowel: No disproportionate dilation of the small or large bowel  loops. No evidence of abnormal bowel wall thickening or inflammatory changes. The appendix is unremarkable. Vascular/Lymphatic: No ascites or pneumoperitoneum. No abdominal or pelvic lymphadenopathy, by size criteria. No aneurysmal dilation of the major abdominal arteries. There are mild peripheral atherosclerotic vascular calcifications of the aorta and its major branches. Reproductive: Retroverted lobulated uterus containing several ill-defined hyperattenuating areas, favored to represent leiomyomas. No large adnexal mass seen. Other: There is  a tiny fat containing umbilical hernia. The soft tissues and abdominal wall are otherwise unremarkable. Musculoskeletal: No suspicious osseous lesions. There are mild multilevel degenerative changes in the visualized spine. IMPRESSION: 1. No metastatic lung carcinoma seen within the abdomen or pelvis. 2. Multiple other nonacute observations, as described above. Electronically Signed   By: Jules Schick M.D.   On: 11/27/2023 20:44   MR BRAIN W WO CONTRAST Result Date: 11/26/2023 CLINICAL DATA:  Non-small cell lung cancer, staging EXAM: MRI HEAD WITHOUT AND WITH CONTRAST TECHNIQUE: Multiplanar, multiecho pulse sequences of the brain and surrounding structures were obtained without and with intravenous contrast. CONTRAST:  7mL GADAVIST GADOBUTROL 1 MMOL/ML IV SOLN COMPARISON:  None Available. FINDINGS: Brain: No restricted diffusion to suggest acute or subacute infarct. No abnormal parenchymal or meningeal enhancement. No acute hemorrhage, mass, mass effect, or midline shift. No hydrocephalus or extra-axial collection. Pituitary and craniocervical junction within normal limits. No hemosiderin deposition to suggest remote hemorrhage. Normal cerebral volume for age. Vascular: Normal arterial flow voids. Normal arterial and venous enhancement. Skull and upper cervical spine: Normal marrow signal. Sinuses/Orbits: Clear paranasal sinuses. No acute finding in the orbits. Other:  The mastoid air cells are well aerated. IMPRESSION: No acute intracranial process. No evidence of intracranial metastatic disease. Electronically Signed   By: Wiliam Ke M.D.   On: 11/26/2023 22:28   [place] CORE BIOPSY (LYMPH NODES) Result Date: 11/26/2023 INDICATION: 58 year old woman with right upper lobe lung mass and right neck lymphadenopathy presents to IR for ultrasound-guided biopsy of right supraclavicular lymph node. EXAM: Ultrasound-guided biopsy of right supraclavicular lymph node MEDICATIONS: None. ANESTHESIA/SEDATION: None COMPLICATIONS: None immediate. PROCEDURE: Informed written consent was obtained from the patient after a thorough discussion of the procedural risks, benefits and alternatives. All questions were addressed. Maximal Sterile Barrier Technique was utilized including caps, mask, sterile gowns, sterile gloves, sterile drape, hand hygiene and skin antiseptic. A timeout was performed prior to the initiation of the procedure. Patient position supine on the ultrasound table. Right neck skin prepped and draped in usual sterile fashion. Following local lidocaine administration, 6- 18 gauge cores were obtained from the enlarged right supraclavicular lymph node utilizing continuous ultrasound guidance. Samples were sent to pathology in formalin. Needle removed and hemostasis achieved with 2 minutes of manual compression. Post procedure ultrasound images showed no evidence of significant hemorrhage. IMPRESSION: Successful ultrasound-guided biopsy of right supraclavicular lymph node. Electronically Signed   By: Acquanetta Belling M.D.   On: 11/26/2023 16:48   MR THORACIC SPINE W WO CONTRAST Result Date: 11/25/2023 CLINICAL DATA:  Metastatic disease evaluation EXAM: MRI THORACIC WITHOUT AND WITH CONTRAST TECHNIQUE: Multiplanar and multiecho pulse sequences of the thoracic spine were obtained without and with intravenous contrast. CONTRAST:  7mL GADAVIST GADOBUTROL 1 MMOL/ML IV SOLN COMPARISON:  No  prior MRI of the thoracic spine available, correlation is made with CT chest 11/25/2023 FINDINGS: Alignment: No listhesis. Preservation of the normal thoracic kyphosis. Vertebrae: No acute fracture, evidence of discitis, or suspicious osseous lesion. No abnormal enhancement in the osseous structures. Cord:  Normal signal and morphology.  No abnormal enhancement. Paraspinal and other soft tissues: Enhancing mass in the right lung, invading the mediastinum, which was better evaluated on the 11/25/2023 CT chest. This mass is adjacent to the anterior right aspect of the C7-T4 vertebral bodies, but the cortical signal appears preserved, and no definite osseous invasion is seen. The mass appears to invade the right neural foramen at T1-T2 (series 24, images 4-5). Disc levels: No significant  spinal canal stenosis or osseous neural foraminal narrowing. As described above, the enhancing mass appears to invade the right neural foramen at T1-T2, and may abut the exiting right T1 nerve roots (series 18, image 6). IMPRESSION: 1. Enhancing mass in the right lung, invading the mediastinum, which was better evaluated on the 11/25/2023 CT chest. This mass is adjacent to the anterior right aspect of the C7-T4 vertebral bodies, but the cortical signal appears preserved, and no definite osseous invasion is seen. The mass appears to invade the right neural foramen at T1-T2, and may abut the exiting right T1 nerve roots. 2. No evidence of metastatic disease in the thoracic spine. Electronically Signed   By: Wiliam Ke M.D.   On: 11/25/2023 20:52   CT CHEST WO CONTRAST Result Date: 11/25/2023 CLINICAL DATA:  Mediastinal mass EXAM: CT CHEST WITHOUT CONTRAST TECHNIQUE: Multidetector CT imaging of the chest was performed following the standard protocol without IV contrast. RADIATION DOSE REDUCTION: This exam was performed according to the departmental dose-optimization program which includes automated exposure control, adjustment of  the mA and/or kV according to patient size and/or use of iterative reconstruction technique. COMPARISON:  CT neck 11/25/2023 FINDINGS: Cardiovascular: Atheromatous vascular calcification in the aortic arch and left anterior descending coronary artery. Moderate cardiomegaly. Mediastinum/Nodes: Right lower neck level V lymph node measures 2.4 cm in short axis on image 30 series 2. Right supraclavicular node 0.9 cm in short axis on image 6 series 2. Index left axillary node 1.2 cm in short axis on image 11 series 2. Index right axillary node 1.2 cm in short axis on image 16 series 2. There is a right upper lobe mass invading the mediastinum and displacing the trachea and esophagus to the left. 1.5 cm hypodense left thyroid lobe lesion on image 6 series 2. Lungs/Pleura: Presumed right upper lobe lung mass invading the mediastinum measures 6.6 by 4.5 cm on image 11 series 2, and displaces the trachea and esophagus to the left. Adjacent right suprahilar nodularity including a 3.1 by 1.8 cm tangential mass on image 43 series 4. The B1A apical segmental bronchus is occluded. Mild atelectasis along both diaphragms. Centrilobular emphysema. Mild airspace opacity posteriorly in the right upper lobe on image 64 series 4, probably from atelectasis. Upper Abdomen: Unremarkable Musculoskeletal: Unremarkable IMPRESSION: 1. Presumed right upper lobe lung mass invading the mediastinum and displacing the trachea and esophagus to the left, measuring 6.6 cm in long axis. Adjacent right suprahilar nodularity including a 3.1 by 1.8 cm tangential mass. The B1A apical segmental bronchus is occluded. 2. Right lower neck level V lymph node measures 2.4 cm in short axis. Right supraclavicular node 0.9 cm in short axis. Index bilateral axillary nodes are mildly enlarged. Findings are compatible with metastatic adenopathy. 3. 1.5 cm hypodense left thyroid lobe lesion. Recommend thyroid US (ref: J Am Coll Radiol. 2015 Feb;12(2): 143-50). 4.  Moderate cardiomegaly. 5. Aortic and coronary atherosclerosis. Aortic Atherosclerosis (ICD10-I70.0) and Emphysema (ICD10-J43.9). Electronically Signed   By: Gaylyn Rong M.D.   On: 11/25/2023 17:07   CT Soft Tissue Neck W Contrast Result Date: 11/25/2023 CLINICAL DATA:  Neck mass, non pulsatile lymph node. Patient reports a palpable knot in the right side his neck. EXAM: CT NECK WITH CONTRAST TECHNIQUE: Multidetector CT imaging of the neck was performed using the standard protocol following the bolus administration of intravenous contrast. RADIATION DOSE REDUCTION: This exam was performed according to the departmental dose-optimization program which includes automated exposure control, adjustment of the mA and/or kV  according to patient size and/or use of iterative reconstruction technique. CONTRAST:  60mL OMNIPAQUE IOHEXOL 300 MG/ML  SOLN COMPARISON:  CT cervical spine without contrast of the same day. FINDINGS: Pharynx and larynx: Mild adenoid hypertrophy is noted. No discrete lesion is present. The oropharynx is within normal limits. Tongue base is. Vallecula and epiglottis are within normal limits. Aryepiglottic folds and piriform sinuses are clear. Vocal cords are midline and symmetric. Trachea is clear. Salivary glands: The submandibular and parotid glands and ducts are within normal limits. Thyroid: Heterogeneous nodule the inferior left lobe measures 2.2 x 2.2 cm on sagittal images. Lymph nodes: A right supraclavicular nodal mass measures 3.6 x 2.4 x 3.7 cm. A right level 4 heterogeneous node measures 15 mm. No significant upper cervical adenopathy is present. A heterogeneous right paraspinous mass begins at the level of C6-7 and extends into the mediastinum. Tumor surrounds the proximal first rib. Tumor extends into the right T1-2 foramen. No discrete osseous destruction is present. Vascular: No significant vascular calcifications are present. Limited intracranial: Within normal limits. Visualized  orbits: The globes and orbits are within normal limits. Mastoids and visualized paranasal sinuses: The paranasal sinuses and mastoid air cells are clear. Skeleton: Multilevel degenerative changes are present in the cervical spine. Uncovertebral spurring contributes to right foraminal stenosis at C6-7. No focal osseous lesions are present. Upper chest: Extensive centrilobular emphysematous changes are present. IMPRESSION: 1. Heterogeneous right paraspinous mass begins at the level of C6-7 and extends into the mediastinum. Tumor surrounds the proximal first rib. Tumor extends into the right T1-2 foramen. This is most concerning for a lung primary neoplasm. Recommend CT of the chest with and without contrast as well as MRI of the thoracic spine without and with contrast 2. Right supraclavicular nodal mass measures 3.6 x 2.4 x 3.7 cm, consistent with metastatic disease. 3. Right level 4 heterogeneous node measures 15 mm. 4. Heterogeneous nodule the inferior left lobe measures 2.2 x 2.2 cm on sagittal images. Recommend non-emergent thyroid ultrasound. Reference: J Am Coll Radiol. 2015 Feb;12(2): 143-50 5. Multilevel degenerative changes in the cervical spine. 6.  Emphysema (ICD10-J43.9). Electronically Signed   By: Marin Roberts M.D.   On: 11/25/2023 16:19   CT Cervical Spine Wo Contrast Result Date: 11/25/2023 CLINICAL DATA:  Cervical radiculopathy, infection suspected, no prior imaging EXAM: CT CERVICAL SPINE WITHOUT CONTRAST TECHNIQUE: Multidetector CT imaging of the cervical spine was performed without intravenous contrast. Multiplanar CT image reconstructions were also generated. RADIATION DOSE REDUCTION: This exam was performed according to the departmental dose-optimization program which includes automated exposure control, adjustment of the mA and/or kV according to patient size and/or use of iterative reconstruction technique. COMPARISON:  None Available. FINDINGS: Alignment: Facet joints are aligned  without dislocation or traumatic listhesis. Dens and lateral masses are aligned. Skull base and vertebrae: No acute fracture of the cervical spine. Heterogeneity of the osseous structures although no well-defined lytic or sclerotic bone lesion. Soft tissues and spinal canal: No prevertebral fluid or swelling. No visible canal hematoma. Disc levels: Mild degenerative endplate spurring. Slight disc height loss at C6-7. Minimal facet joint spurring. Upper chest: Right supraclavicular mass and partially imaged superior mediastinal mass. 1.7 cm left thyroid lobe nodule. Other: None. IMPRESSION: 1. No acute fracture or traumatic listhesis of the cervical spine. 2. Right supraclavicular mass and partially imaged superior mediastinal mass. Attention on forthcoming CT of the neck. Contrast enhanced CT of the chest is recommended to more fully assess the mediastinal mass. 3. 1.7 cm left  thyroid lobe nodule. Recommend nonemergent thyroid US (ref: J Am Coll Radiol. 2015 Feb;12(2): 143-50). Electronically Signed   By: Duanne Guess D.O.   On: 11/25/2023 15:32     Subjective: No acute issues or events overnight   Discharge Exam: Vitals:   12/24/23 0837 12/24/23 1200  BP:    Pulse:    Resp:    Temp:    SpO2: 94% 92%   Vitals:   12/23/23 2037 12/24/23 0421 12/24/23 0837 12/24/23 1200  BP: 117/67 138/82    Pulse: 86 89    Resp: 18 18    Temp: 98.3 F (36.8 C) 98.5 F (36.9 C)    TempSrc: Oral Oral    SpO2: 98% 93% 94% 92%  Weight:      Height:        General: Pt is alert, awake, not in acute distress Cardiovascular: RRR, S1/S2 +, no rubs, no gallops Respiratory: CTA bilaterally, no wheezing, no rhonchi Abdominal: Soft, NT, ND, bowel sounds + Extremities: no edema, no cyanosis    The results of significant diagnostics from this hospitalization (including imaging, microbiology, ancillary and laboratory) are listed below for reference.     Microbiology: Recent Results (from the past 240  hours)  Culture, blood (Routine X 2) w Reflex to ID Panel     Status: None   Collection Time: 12/17/23  5:40 PM   Specimen: BLOOD  Result Value Ref Range Status   Specimen Description   Final    BLOOD SITE NOT SPECIFIED Performed at [facility], [address] [place], [place] [address]    Special Requests   Final    BOTTLES DRAWN AEROBIC AND ANAEROBIC Blood Culture adequate volume Performed at [facility], [address] [place], [place] [address]    Culture   Final    NO GROWTH 5 DAYS Performed at [facility], [address] [place], [place] [address]    Report Status 12/22/2023 FINAL  Final  Culture, blood (Routine X 2) w Reflex to ID Panel     Status: None   Collection Time: 12/17/23  6:51 PM   Specimen: BLOOD LEFT HAND  Result Value Ref Range Status   Specimen Description   Final    BLOOD LEFT HAND Performed at [facility], [address], [place], [place] [address]    Special Requests   Final    BOTTLES DRAWN AEROBIC ONLY Blood Culture results may not be optimal due to an inadequate volume of blood received in culture bottles Performed at [facility], [address], [place], [place] [address]    Culture   Final    NO GROWTH 5 DAYS Performed at [facility], [address], [place], [place] [address]    Report Status 12/22/2023 FINAL  Final     Labs: BNP (last 3 results) Recent Labs    12/02/23 0925 12/20/23 0504  BNP 50.6 473.3*   Basic Metabolic Panel: Recent Labs  Lab 12/19/23 0531 12/20/23 0504 12/21/23 0456 12/22/23 0531 12/23/23 0526 12/23/23 0730 12/24/23 0825  NA 142 145 141 143  --  141 140  K 3.8 3.1* 2.9* 3.5  --  3.9 4.0  CL 116* 115* 111 108  --  106 106  CO2 21* 25 27 28   --  28 26  GLUCOSE 87 96 105* 104*  --  111* 106*  BUN 8 <5* <5* 6  --  9 8  CREATININE 0.77 0.77 0.53 0.48  --  0.63 0.44  CALCIUM 7.1* 6.8* 6.7* 7.0*  --  6.8* 7.4*  MG 1.8 1.7 1.7  1.7 1.8  --  1.8  PHOS 2.6  --   --  2.6  --   --   --    Liver Function Tests: Recent Labs  Lab 12/20/23 0504 12/21/23 0456 12/22/23 0531 12/23/23 0730 12/24/23 0825  AST 11* 12* 13* 14* 18  ALT 9 9 9 10 9   ALKPHOS 57 54 63 69 68  BILITOT 0.4 0.3 0.3 0.2 0.5  PROT 6.4* 6.5 6.9 7.4 6.9  ALBUMIN 1.8* 2.0* 2.1* 2.2* 2.2*   No results for input(s): "LIPASE", "AMYLASE" in the last 168 hours. No results for input(s): "AMMONIA" in the last 168 hours. CBC: Recent Labs  Lab 12/20/23 0504 12/21/23 0456 12/21/23 1700 12/22/23 0531 12/23/23 0730 12/24/23 0825  WBC 5.4 4.3  --  3.9* 4.6 4.2  NEUTROABS 3.6 2.5  --  2.3 2.9 2.6  HGB 8.2* 8.4* 9.7* 10.2* 10.7* 10.0*  HCT 26.9* 26.7* 31.1* 33.2* 34.9* 32.4*  MCV 87.3 85.0  --  87.6 88.4 88.5  PLT 285 298  --  321 345 326   Cardiac Enzymes: No results for input(s): "CKTOTAL", "CKMB", "CKMBINDEX", "TROPONINI" in the last 168 hours. BNP: Invalid input(s): "POCBNP" CBG: No results for input(s): "GLUCAP" in the last 168 hours. D-Dimer No results for input(s): "DDIMER" in the last 72 hours. Hgb A1c No results for input(s): "HGBA1C" in the last 72 hours. Lipid Profile No results for input(s): "CHOL", "HDL", "LDLCALC", "TRIG", "CHOLHDL", "LDLDIRECT" in the last 72 hours. Thyroid function studies No results for input(s): "TSH", "T4TOTAL", "T3FREE", "THYROIDAB" in the last 72 hours.  Invalid input(s): "FREET3" Anemia work up No results for input(s): "VITAMINB12", "FOLATE", "FERRITIN", "TIBC", "IRON", "RETICCTPCT" in the last 72 hours. Urinalysis    Component Value Date/Time   COLORURINE RED (A) 12/18/2023 1020   APPEARANCEUR TURBID (A) 12/18/2023 1020   LABSPEC 1.027 12/18/2023 1020   PHURINE 5.0 12/18/2023 1020   GLUCOSEU NEGATIVE 12/18/2023 1020   HGBUR NEGATIVE 12/18/2023 1020   BILIRUBINUR NEGATIVE 12/18/2023 1020   KETONESUR 5 (A) 12/18/2023 1020   PROTEINUR 100 (A) 12/18/2023 1020   UROBILINOGEN 0.2 01/25/2013 0823    NITRITE NEGATIVE 12/18/2023 1020   LEUKOCYTESUR NEGATIVE 12/18/2023 1020   Sepsis Labs Recent Labs  Lab 12/21/23 0456 12/22/23 0531 12/23/23 0730 12/24/23 0825  WBC 4.3 3.9* 4.6 4.2   Microbiology Recent Results (from the past 240 hours)  Culture, blood (Routine X 2) w Reflex to ID Panel     Status: None   Collection Time: 12/17/23  5:40 PM   Specimen: BLOOD  Result Value Ref Range Status   Specimen Description   Final    BLOOD SITE NOT SPECIFIED Performed at [facility], [address] [place], [place] [address]    Special Requests   Final    BOTTLES DRAWN AEROBIC AND ANAEROBIC Blood Culture adequate volume Performed at [facility], [address], [place], [place] [address]    Culture   Final    NO GROWTH 5 DAYS Performed at [facility], [address], [place], [place] [address]    Report Status 12/22/2023 FINAL  Final  Culture, blood (Routine X 2) w Reflex to ID Panel     Status: None   Collection Time: 12/17/23  6:51 PM   Specimen: BLOOD LEFT HAND  Result Value  Ref Range Status   Specimen Description   Final    BLOOD LEFT HAND Performed at [facility], [address] [place], [place] [address]    Special Requests   Final    BOTTLES DRAWN AEROBIC ONLY Blood Culture results may not be optimal due to an inadequate volume of blood received in culture bottles Performed at [facility], [address] [place], [place] [address]    Culture   Final    NO GROWTH 5 DAYS Performed at [facility], [address] [place], [place] [address]    Report Status 12/22/2023 FINAL  Final     Time coordinating discharge: Over 30 minutes  SIGNED:   Azucena Fallen, DO [facility] 12/24/2023, 5:19 PM Pager   If 7PM-7AM, please contact night-coverage [email or web address]

## 2023-12-27 ENCOUNTER — Encounter: Payer: Self-pay | Admitting: *Deleted

## 2023-12-27 ENCOUNTER — Encounter (HOSPITAL_COMMUNITY): Payer: Self-pay | Admitting: Hematology & Oncology

## 2023-12-27 NOTE — Progress Notes (Signed)
Patient discharged from the hospital. Spoke with Dr Myna Hidalgo and he would like to see patient this week for follow up and treatment. Message sent to scheduling.   Oncology Nurse Navigator Documentation     12/27/2023    8:45 AM  Oncology Nurse Navigator Flowsheets  Navigator Follow Up Date: 12/29/2023  Navigator Follow Up Reason: Follow-up Appointment;Chemotherapy  Navigator Location [facility]  Navigator Encounter Type Appt/Treatment Plan Review  Patient Visit Type MedOnc  Treatment Phase Active Tx  Barriers/Navigation Needs Coordination of Care  Interventions Coordination of Care  Acuity Level 2-Minimal Needs (1-2 Barriers Identified)  Coordination of Care Appts  Support Groups/Services Friends and Family  Time Spent with Patient 15

## 2023-12-28 ENCOUNTER — Other Ambulatory Visit: Payer: Self-pay

## 2023-12-28 DIAGNOSIS — C3411 Malignant neoplasm of upper lobe, right bronchus or lung: Secondary | ICD-10-CM

## 2023-12-29 ENCOUNTER — Inpatient Hospital Stay: Payer: 59 | Admitting: Licensed Clinical Social Worker

## 2023-12-29 ENCOUNTER — Inpatient Hospital Stay (HOSPITAL_BASED_OUTPATIENT_CLINIC_OR_DEPARTMENT_OTHER): Payer: 59 | Admitting: Hematology & Oncology

## 2023-12-29 ENCOUNTER — Encounter: Payer: Self-pay | Admitting: *Deleted

## 2023-12-29 ENCOUNTER — Inpatient Hospital Stay: Payer: 59

## 2023-12-29 ENCOUNTER — Encounter: Payer: Self-pay | Admitting: Hematology & Oncology

## 2023-12-29 ENCOUNTER — Other Ambulatory Visit: Payer: Self-pay | Admitting: *Deleted

## 2023-12-29 VITALS — BP 150/86 | HR 95 | Temp 98.3°F | Resp 19 | Ht 66.0 in | Wt 135.1 lb

## 2023-12-29 DIAGNOSIS — C3411 Malignant neoplasm of upper lobe, right bronchus or lung: Secondary | ICD-10-CM | POA: Diagnosis not present

## 2023-12-29 DIAGNOSIS — Z5112 Encounter for antineoplastic immunotherapy: Secondary | ICD-10-CM | POA: Insufficient documentation

## 2023-12-29 DIAGNOSIS — C3490 Malignant neoplasm of unspecified part of unspecified bronchus or lung: Secondary | ICD-10-CM | POA: Diagnosis present

## 2023-12-29 DIAGNOSIS — Z923 Personal history of irradiation: Secondary | ICD-10-CM | POA: Diagnosis not present

## 2023-12-29 DIAGNOSIS — C770 Secondary and unspecified malignant neoplasm of lymph nodes of head, face and neck: Secondary | ICD-10-CM | POA: Diagnosis not present

## 2023-12-29 DIAGNOSIS — Z5111 Encounter for antineoplastic chemotherapy: Secondary | ICD-10-CM | POA: Diagnosis present

## 2023-12-29 LAB — CBC WITH DIFFERENTIAL (CANCER CENTER ONLY)
Abs Immature Granulocytes: 0.12 10*3/uL — ABNORMAL HIGH (ref 0.00–0.07)
Basophils Absolute: 0.1 10*3/uL (ref 0.0–0.1)
Basophils Relative: 1 %
Eosinophils Absolute: 0 10*3/uL (ref 0.0–0.5)
Eosinophils Relative: 0 %
HCT: 37.2 % (ref 36.0–46.0)
Hemoglobin: 11.8 g/dL — ABNORMAL LOW (ref 12.0–15.0)
Immature Granulocytes: 2 %
Lymphocytes Relative: 10 %
Lymphs Abs: 0.8 10*3/uL (ref 0.7–4.0)
MCH: 27.6 pg (ref 26.0–34.0)
MCHC: 31.7 g/dL (ref 30.0–36.0)
MCV: 87.1 fL (ref 80.0–100.0)
Monocytes Absolute: 0.5 10*3/uL (ref 0.1–1.0)
Monocytes Relative: 7 %
Neutro Abs: 6.4 10*3/uL (ref 1.7–7.7)
Neutrophils Relative %: 80 %
Platelet Count: 476 10*3/uL — ABNORMAL HIGH (ref 150–400)
RBC: 4.27 MIL/uL (ref 3.87–5.11)
RDW: 18.1 % — ABNORMAL HIGH (ref 11.5–15.5)
WBC Count: 7.9 10*3/uL (ref 4.0–10.5)
nRBC: 0 % (ref 0.0–0.2)

## 2023-12-29 LAB — CMP (CANCER CENTER ONLY)
ALT: <5 U/L (ref 0–44)
AST: 10 U/L — ABNORMAL LOW (ref 15–41)
Albumin: 3.2 g/dL — ABNORMAL LOW (ref 3.5–5.0)
Alkaline Phosphatase: 76 U/L (ref 38–126)
Anion gap: 7 (ref 5–15)
BUN: 7 mg/dL (ref 6–20)
CO2: 26 mmol/L (ref 22–32)
Calcium: 8.5 mg/dL — ABNORMAL LOW (ref 8.9–10.3)
Chloride: 108 mmol/L (ref 98–111)
Creatinine: 0.49 mg/dL (ref 0.44–1.00)
GFR, Estimated: >60 mL/min (ref >60)
Glucose, Bld: 115 mg/dL — ABNORMAL HIGH (ref 70–99)
Potassium: 3.4 mmol/L — ABNORMAL LOW (ref 3.5–5.1)
Sodium: 141 mmol/L (ref 135–145)
Total Bilirubin: 0.3 mg/dL (ref 0.0–1.2)
Total Protein: 7.5 g/dL (ref 6.5–8.1)

## 2023-12-29 MED ORDER — SODIUM CHLORIDE 0.9 % IV SOLN: INTRAVENOUS | Status: DC

## 2023-12-29 MED ORDER — HEPARIN SOD (PORK) LOCK FLUSH 100 UNIT/ML IV SOLN
500.0000 [IU] | Freq: Once | INTRAVENOUS | Status: AC | PRN
  Administered 2023-12-29: 500 [IU]

## 2023-12-29 MED ORDER — LEVOFLOXACIN 500 MG PO TABS: 500.0000 mg | ORAL_TABLET | Freq: Every day | ORAL | 6 refills | Status: DC

## 2023-12-29 MED ORDER — SODIUM CHLORIDE 0.9 % IV SOLN
1200.0000 mg | Freq: Once | INTRAVENOUS | Status: AC
  Administered 2023-12-29: 1200 mg via INTRAVENOUS
  Filled 2023-12-29: qty 20

## 2023-12-29 MED ORDER — DEXAMETHASONE 4 MG PO TABS: 8.0000 mg | ORAL_TABLET | Freq: Every day | ORAL | 2 refills | Status: DC

## 2023-12-29 MED ORDER — DEXAMETHASONE SODIUM PHOSPHATE 10 MG/ML IJ SOLN
10.0000 mg | Freq: Once | INTRAMUSCULAR | Status: AC
  Administered 2023-12-29: 10 mg via INTRAVENOUS
  Filled 2023-12-29: qty 1

## 2023-12-29 MED ORDER — PROCHLORPERAZINE MALEATE 10 MG PO TABS: 10.0000 mg | ORAL_TABLET | Freq: Four times a day (QID) | ORAL | 1 refills | Status: DC | PRN

## 2023-12-29 MED ORDER — SODIUM CHLORIDE 0.9 % IV SOLN
450.0000 mg | Freq: Once | INTRAVENOUS | Status: AC
  Administered 2023-12-29: 450 mg via INTRAVENOUS
  Filled 2023-12-29: qty 45

## 2023-12-29 MED ORDER — SODIUM CHLORIDE 0.9 % IV SOLN
150.0000 mg | Freq: Once | INTRAVENOUS | Status: AC
  Administered 2023-12-29: 150 mg via INTRAVENOUS
  Filled 2023-12-29: qty 150

## 2023-12-29 MED ORDER — LIDOCAINE VISCOUS HCL 2 % MT SOLN: 15.0000 mL | Freq: Four times a day (QID) | OROMUCOSAL | 4 refills | Status: DC | PRN

## 2023-12-29 MED ORDER — ONDANSETRON HCL 8 MG PO TABS: 8.0000 mg | ORAL_TABLET | Freq: Three times a day (TID) | ORAL | 1 refills | Status: AC | PRN

## 2023-12-29 MED ORDER — SODIUM CHLORIDE 0.9% FLUSH
10.0000 mL | INTRAVENOUS | Status: DC | PRN
  Administered 2023-12-29: 10 mL

## 2023-12-29 MED ORDER — SODIUM CHLORIDE 0.9 % IV SOLN
80.0000 mg/m2 | Freq: Once | INTRAVENOUS | Status: AC
  Administered 2023-12-29: 144 mg via INTRAVENOUS
  Filled 2023-12-29: qty 7.2

## 2023-12-29 MED ORDER — PALONOSETRON HCL INJECTION 0.25 MG/5ML
0.2500 mg | Freq: Once | INTRAVENOUS | Status: AC
  Administered 2023-12-29: 0.25 mg via INTRAVENOUS
  Filled 2023-12-29: qty 5

## 2023-12-29 NOTE — Progress Notes (Signed)
Hematology and Oncology Follow Up Visit  Tammy Cordova [id number] Mar 06, 1966 58 y.o. 12/29/2023   Principle Diagnosis:  Extensive stage small cell lung cancer --TMB=23  Current Therapy:   Status post cycle 1 of carboplatinum/etoposide + XRT start cycle 1 on 12/04/2023     Interim History:  Tammy Cordova is back for her first office visit.  She was back in the hospital because of radiation esophagitis.  She is doing a whole lot better now.  She is able to swallow.  There is some odynophagia.  I will send in some viscous lidocaine..  She is completed the radiotherapy.  Again she is done very nicely with this.  The right supraclavicular lymph node has regressed.  She has had no cough.  There is been no hemoptysis.  She has had no nausea or vomiting.  She has had no diarrhea.  There is been no leg swelling.  She has had no rashes.  Again, I have to believe that she is responding.  Overall, I would say that her performance status is probably ECOG 1. She  Medications:  Current Outpatient Medications:    albuterol (VENTOLIN HFA) 108 (90 Base) MCG/ACT inhaler, Inhale 2 puffs into the lungs every 6 (six) hours as needed for wheezing or shortness of breath., Disp: , Rfl:    dronabinol ([name]) 2.5 MG capsule, Take 1 capsule (2.5 mg total) by mouth 2 (two) times daily before lunch and supper., Disp: 14 capsule, Rfl: 0   folic acid (FOLVITE) 1 MG tablet, Take 1 tablet (1 mg total) by mouth daily., Disp: 30 tablet, Rfl: 3   HYDROcodone-acetaminophen (NORCO/VICODIN) 5-325 MG tablet, Take 1 tablet by mouth every 6 (six) hours as needed for moderate pain (pain score 4-6)., Disp: 20 tablet, Rfl: 0   pantoprazole (PROTONIX) 40 MG tablet, Take 40 mg by mouth every morning., Disp: , Rfl:    senna-docusate (EQ SENNA-S) 8.6-50 MG tablet, Take 1 tablet by mouth 2 (two) times daily as needed for mild constipation., Disp: 60 tablet, Rfl: 0   sucralfate (CARAFATE) 1 GM/10ML suspension, Take 10 mLs (1 g total)  by mouth 4 (four) times daily -  with meals and at bedtime., Disp: 420 mL, Rfl: 0   Vitamin D, Ergocalciferol, (DRISDOL) 1.25 MG (50000 UNIT) CAPS capsule, Take 1 capsule (50,000 Units total) by mouth every 7 (seven) days., Disp: 4 capsule, Rfl: 2  Allergies: No Known Allergies  Past Medical History, Surgical history, Social history, and Family History were reviewed and updated.  Review of Systems: Review of Systems  Constitutional:  Positive for appetite change and unexpected weight change.  HENT:   Positive for sore throat.   Eyes: Negative.   Respiratory: Negative.    Cardiovascular: Negative.   Gastrointestinal:  Positive for nausea.  Endocrine: Negative.   Genitourinary: Negative.    Musculoskeletal: Negative.   Skin: Negative.   Neurological:  Positive for dizziness.  Hematological: Negative.   Psychiatric/Behavioral: Negative.      Physical Exam:  height is 5\' 6"  (1.676 m) and weight is 135 lb 1.9 oz (61.3 kg). Her oral temperature is 98.3 F (36.8 C). Her blood pressure is 150/86 (abnormal) and her pulse is 95. Her respiration is 19 and oxygen saturation is 95%.   Wt Readings from Last 3 Encounters:  12/29/23 135 lb 1.9 oz (61.3 kg)  12/17/23 138 lb (62.6 kg)  12/17/23 138 lb (62.6 kg)    Physical Exam Vitals reviewed.  HENT:     Head: Normocephalic  and atraumatic.  Eyes:     Pupils: Pupils are equal, round, and reactive to light.  Cardiovascular:     Rate and Rhythm: Normal rate and regular rhythm.     Heart sounds: Normal heart sounds.  Pulmonary:     Effort: Pulmonary effort is normal.     Breath sounds: Normal breath sounds.  Abdominal:     General: Bowel sounds are normal.     Palpations: Abdomen is soft.  Musculoskeletal:        General: No tenderness or deformity. Normal range of motion.     Cervical back: Normal range of motion.  Lymphadenopathy:     Cervical: No cervical adenopathy.  Skin:    General: Skin is warm and dry.     Findings: No  erythema or rash.  Neurological:     Mental Status: She is alert and oriented to person, place, and time.  Psychiatric:        Behavior: Behavior normal.        Thought Content: Thought content normal.        Judgment: Judgment normal.      Lab Results  Component Value Date   WBC 7.9 12/29/2023   HGB 11.8 (L) 12/29/2023   HCT 37.2 12/29/2023   MCV 87.1 12/29/2023   PLT 476 (H) 12/29/2023     Chemistry      Component Value Date/Time   NA 141 12/29/2023 0940   K 3.4 (L) 12/29/2023 0940   CL 108 12/29/2023 0940   CO2 26 12/29/2023 0940   BUN 7 12/29/2023 0940   CREATININE 0.49 12/29/2023 0940      Component Value Date/Time   CALCIUM 8.5 (L) 12/29/2023 0940   ALKPHOS 76 12/29/2023 0940   AST 10 (L) 12/29/2023 0940   ALT <5 12/29/2023 0940   BILITOT 0.3 12/29/2023 0940       Impression and Plan: Tammy Cordova is a very charming 58 year old Afro-American female with extensive stage small cell lung cancer.  Again, we will move ahead with treatment.  I do think that she is going to respond.  I hate that she had to be admitted but we got her through the esophagitis.  She completed her radiotherapy.  Thankfully, her calcium continues to improve.  She had problems with profound hypocalcemia.  The fact that the calcium is improving I think is also indicative of the fact that her cancer is responding.  We will go ahead with her second cycle of treatment.  After this cycle, we will then plan for another PET scan and see how everything looks.  Hopefully, the viscous lidocaine will help with her odynophagia.  We will go ahead and plan to get her back to see [place] in another 3 weeks.  Will see about getting the PET scan in 2 weeks.     [name], MD 1/29/202510:37 AM

## 2023-12-29 NOTE — Patient Instructions (Addendum)
[facility] - A DEPT OF [facility]  Discharge Instructions: Thank you for choosing [facility] to provide your oncology and hematology care.   If you have a lab appointment with the Cancer Center, please go directly to the Cancer Center and check in at the registration area.  Wear comfortable clothing and clothing appropriate for easy access to any Portacath or PICC line.   We strive to give you quality time with your provider. You may need to reschedule your appointment if you arrive late (15 or more minutes).  Arriving late affects you and other patients whose appointments are after yours.  Also, if you miss three or more appointments without notifying the office, you may be dismissed from the clinic at the provider's discretion.      For prescription refill requests, have your pharmacy contact our office and allow 72 hours for refills to be completed.    Today you received the following chemotherapy and/or immunotherapy agents Tecentriq/VP 16/Carboplatin      To help prevent nausea and vomiting after your treatment, we encourage you to take your nausea medication as directed.  BELOW ARE SYMPTOMS THAT SHOULD BE REPORTED IMMEDIATELY: *FEVER GREATER THAN 100.4 F (38 C) OR HIGHER *CHILLS OR SWEATING *NAUSEA AND VOMITING THAT IS NOT CONTROLLED WITH YOUR NAUSEA MEDICATION *UNUSUAL SHORTNESS OF BREATH *UNUSUAL BRUISING OR BLEEDING *URINARY PROBLEMS (pain or burning when urinating, or frequent urination) *BOWEL PROBLEMS (unusual diarrhea, constipation, pain near the anus) TENDERNESS IN MOUTH AND THROAT WITH OR WITHOUT PRESENCE OF ULCERS (sore throat, sores in mouth, or a toothache) UNUSUAL RASH, SWELLING OR PAIN  UNUSUAL VAGINAL DISCHARGE OR ITCHING   Items with * indicate a potential emergency and should be followed up as soon as possible or go to the Emergency Department if any problems should occur.  Please show the CHEMOTHERAPY ALERT CARD  or IMMUNOTHERAPY ALERT CARD at check-in to the Emergency Department and triage nurse. Should you have questions after your visit or need to cancel or reschedule your appointment, please contact [facility] - A DEPT OF [facility]  [phone] and follow the prompts.  Office hours are 8:00 a.m. to 4:30 p.m. Monday - Friday. Please note that voicemails left after 4:00 p.m. may not be returned until the following business day.  We are closed weekends and major holidays. You have access to a nurse at all times for urgent questions. Please call the main number to the clinic [phone] and follow the prompts.  For any non-urgent questions, you may also contact your provider using MyChart. We now offer e-Visits for anyone 60 and older to request care online for non-urgent symptoms. For details visit [email or web address].   Also download the MyChart app! Go to the app store, search "MyChart", open the app, select [place], and log in with your MyChart username and password.  [facility]  Discharge Instructions: Thank you for choosing [facility] to provide your oncology and hematology care.   If you have a lab appointment with the Cancer Center, please go directly to the Cancer Center and check in at the registration area.  Wear comfortable clothing and clothing appropriate for easy access to any Portacath or PICC line.   We strive to give you quality time with your provider. You may need to reschedule your appointment if you arrive late (15 or  more minutes).  Arriving late affects you and other patients whose appointments are after yours.  Also, if you miss three or more appointments without notifying the office, you may be dismissed from the clinic at the provider's discretion.      For prescription refill requests, have your pharmacy contact our office and allow 72 hours for refills to be  completed. Today you received the following chemotherapy and/or immunotherapy agents tecentriq,vp-16, carboplatin   To help prevent nausea and vomiting after your treatment, we encourage you to take your nausea medication as directed.  BELOW ARE SYMPTOMS THAT SHOULD BE REPORTED IMMEDIATELY: *FEVER GREATER THAN 100.4 F (38 C) OR HIGHER *CHILLS OR SWEATING *NAUSEA AND VOMITING THAT IS NOT CONTROLLED WITH YOUR NAUSEA MEDICATION *UNUSUAL SHORTNESS OF BREATH *UNUSUAL BRUISING OR BLEEDING *URINARY PROBLEMS (pain or burning when urinating, or frequent urination) *BOWEL PROBLEMS (unusual diarrhea, constipation, pain near the anus) TENDERNESS IN MOUTH AND THROAT WITH OR WITHOUT PRESENCE OF ULCERS (sore throat, sores in mouth, or a toothache) UNUSUAL RASH, SWELLING OR PAIN  UNUSUAL VAGINAL DISCHARGE OR ITCHING   Items with * indicate a potential emergency and should be followed up as soon as possible or go to the Emergency Department if any problems should occur.  Please show the CHEMOTHERAPY ALERT CARD or IMMUNOTHERAPY ALERT CARD at check-in to the Emergency Department and triage nurse. Should you have questions after your visit or need to cancel or reschedule your appointment, please contact [facility] - A DEPT OF [facility]  [phone] and follow the prompts.  Office hours are 8:00 a.m. to 4:30 p.m. Monday - Friday. Please note that voicemails left after 4:00 p.m. may not be returned until the following business day.  We are closed weekends and major holidays. You have access to a nurse at all times for urgent questions. Please call the main number to the clinic [phone] and follow the prompts.  For any non-urgent questions, you may also contact your provider using MyChart. We now offer e-Visits for anyone 82 and older to request care online for non-urgent symptoms. For details visit [email or web address].   Also download the MyChart app! Go to the app  store, search "MyChart", open the app, select [place], and log in with your MyChart username and password.

## 2023-12-29 NOTE — Progress Notes (Signed)
[facility] Clinical Social Work  Initial Assessment   Tammy Cordova is a 58 y.o. year old female presenting alone. Clinical Social Work was referred by nurse navigator for assessment of psychosocial needs.   SDOH (Social Determinants of Health) assessments performed: Yes SDOH Interventions    Flowsheet Row Office Visit from 12/17/2023 in [facility] - A Dept Of [place] ED to Hosp-Admission (Discharged) from 11/25/2023 in [place] [facility]  SDOH Interventions    Food Insecurity Interventions Patient Declined Inpatient TOC, Community Resources Provided  Housing Interventions -- Intervention Not Indicated, Inpatient TOC  Transportation Interventions -- Intervention Not Indicated, Inpatient TOC  Utilities Interventions -- [employer] Provided, Inpatient TOC       SDOH Screenings   Food Insecurity: Patient Declined (12/17/2023)  Recent Concern: Food Insecurity - Food Insecurity Present (12/03/2023)  Housing: Low Risk  (12/17/2023)  Transportation Needs: No Transportation Needs (12/17/2023)  Utilities: Not At Risk (12/17/2023)  Recent Concern: Utilities - At Risk (11/27/2023)  Social Connections: Socially Integrated (12/03/2023)  Tobacco Use: High Risk (12/29/2023)     Distress Screen completed: No     No data to display            Family/Social Information:  Housing Arrangement: patient lives with her husband and brother. Family members/support persons in your life? Family and Colleagues Transportation concerns: no  Employment: Patient is currently on short-term disability from work..  Income source: Short-Term Disability Financial concerns: No Type of concern: None Food access concerns: no Religious or spiritual practice: Yes-Patient identifies as Methodist. Advanced directives: No Services Currently in place:  [employer]  Coping/ Adjustment to diagnosis: Patient understands treatment plan and what happens next? yes Concerns about  diagnosis and/or treatment: I'm not especially worried about anything Patient reported stressors: Adjusting to my illness Hopes and/or priorities: Family Patient enjoys time with family/ friends Current coping skills/ strengths: Capable of independent living , Manufacturing systems engineer , General fund of knowledge , Motivation for treatment/growth , and Supportive family/friends     SUMMARY: Current SDOH Barriers:  Patient denied.  Clinical Social Work Clinical Goal(s):  Explore community resource options for unmet needs related to:  None  Interventions: Discussed common feeling and emotions when being diagnosed with cancer, and the importance of support during treatment Informed patient of the support team roles and support services at [facility] Provided CSW contact information and encouraged patient to call with any questions or concerns Provided patient with information about the [employer].  Provided massage certificates to her.   Follow Up Plan: Patient will contact CSW with any support or resource needs Patient verbalizes understanding of plan: Yes    Dorothey Baseman, LCSW Clinical Social Worker [facility]

## 2023-12-29 NOTE — Patient Instructions (Signed)
Implanted [facility] Guide An implanted port is a device that is placed under the skin. It is usually placed in the chest. The device may vary based on the need. Implanted ports can be used to give IV medicine, to take blood, or to give fluids. You may have an implanted port if: You need IV medicine that would be irritating to the small veins in your hands or arms. You need IV medicines, such as chemotherapy, for a long period of time. You need IV nutrition for a long period of time. You may have fewer limitations when using a port than you would if you used other types of long-term IVs. You will also likely be able to return to normal activities after your incision heals. An implanted port has two main parts: Reservoir. The reservoir is the part where a needle is inserted to give medicines or draw blood. The reservoir is round. After the port is placed, it appears as a small, raised area under your skin. Catheter. The catheter is a small, thin tube that connects the reservoir to a vein. Medicine that is inserted into the reservoir goes into the catheter and then into the vein. How is my port accessed? To access your port: A numbing cream may be placed on the skin over the port site. Your health care provider will put on a mask and sterile gloves. The skin over your port will be cleaned carefully with a germ-killing soap and allowed to dry. Your health care provider will gently pinch the port and insert a needle into it. Your health care provider will check for a blood return to make sure the port is in the vein and is still working (patent). If your port needs to remain accessed to get medicine continuously (constant infusion), your health care provider will place a clear bandage (dressing) over the needle site. The dressing and needle will need to be changed every week, or as told by your health care provider. What is flushing? Flushing helps keep the port working. Follow instructions from your  health care provider about how and when to flush the port. Ports are usually flushed with saline solution or a medicine called heparin. The need for flushing will depend on how the port is used: If the port is only used from time to time to give medicines or draw blood, the port may need to be flushed: Before and after medicines have been given. Before and after blood has been drawn. As part of routine maintenance. Flushing may be recommended every 4-6 weeks. If a constant infusion is running, the port may not need to be flushed. Throw away any syringes in a disposal container that is meant for sharp items (sharps container). You can buy a sharps container from a pharmacy, or you can make one by using an empty hard plastic bottle with a cover. How long will my port stay implanted? The port can stay in for as long as your health care provider thinks it is needed. When it is time for the port to come out, a surgery will be done to remove it. The surgery will be similar to the procedure that was done to put the port in. Follow these instructions at home: Caring for your port and port site Flush your port as told by your health care provider. If you need an infusion over several days, follow instructions from your health care provider about how to take care of your port site. Make sure you: Change your  dressing as told by your health care provider. Wash your hands with soap and water for at least 20 seconds before and after you change your dressing. If soap and water are not available, use alcohol-based hand sanitizer. Place any used dressings or infusion bags into a plastic bag. Throw that bag in the trash. Keep the dressing that covers the needle clean and dry. Do not get it wet. Do not use scissors or sharp objects near the infusion tubing. Keep any external tubes clamped, unless they are being used. Check your port site every day for signs of infection. Check for: Redness, swelling, or  pain. Fluid or blood. Warmth. Pus or a bad smell. Protect the skin around the port site. Avoid wearing bra straps that rub or irritate the site. Protect the skin around your port from seat belts. Place a soft pad over your chest if needed. Bathe or shower as told by your health care provider. The site may get wet as long as you are not actively receiving an infusion. General instructions  Return to your normal activities as told by your health care provider. Ask your health care provider what activities are safe for you. Carry a medical alert card or wear a medical alert bracelet at all times. This will let health care providers know that you have an implanted port in case of an emergency. Where to find more information American Cancer Society: www.cancer.org American Society of Clinical Oncology: www.cancer.net Contact a health care provider if: You have a fever or chills. You have redness, swelling, or pain at the port site. You have fluid or blood coming from your port site. Your incision feels warm to the touch. You have pus or a bad smell coming from the port site. Summary Implanted ports are usually placed in the chest for long-term IV access. Follow instructions from your health care provider about flushing the port and changing bandages (dressings). Take care of the area around your port by avoiding clothing that puts pressure on the area, and by watching for signs of infection. Protect the skin around your port from seat belts. Place a soft pad over your chest if needed. Contact a health care provider if you have a fever or you have redness, swelling, pain, fluid, or a bad smell at the port site. This information is not intended to replace advice given to you by your health care provider. Make sure you discuss any questions you have with your health care provider. Document Revised: 05/20/2021 Document Reviewed: 05/20/2021 Elsevier Patient Education  2024 [employer].

## 2023-12-29 NOTE — Progress Notes (Signed)
Patient is doing much better after her recent admission. She will proceed with cycle two today. Will need PET prior to next cycle.  Oncology Nurse Navigator Documentation     12/29/2023    9:45 AM  Oncology Nurse Navigator Flowsheets  Navigator Follow Up Date: 12/30/2023  Navigator Follow Up Reason: Radiology  Navigator Location [facility]  Navigator Encounter Type Appt/Treatment Plan Review  Patient Visit Type MedOnc  Treatment Phase Active Tx  Barriers/Navigation Needs Coordination of Care  Interventions None Required  Acuity Level 2-Minimal Needs (1-2 Barriers Identified)  Support Groups/Services Friends and Family  Time Spent with Patient 15

## 2023-12-29 NOTE — Progress Notes (Signed)
Leave carboplatin dose at 450 mg today per Dr. Myna Hidalgo.

## 2023-12-30 ENCOUNTER — Encounter: Payer: Self-pay | Admitting: *Deleted

## 2023-12-30 ENCOUNTER — Inpatient Hospital Stay: Payer: 59

## 2023-12-30 ENCOUNTER — Inpatient Hospital Stay: Payer: 59 | Admitting: Dietician

## 2023-12-30 VITALS — BP 144/72 | HR 78 | Temp 98.1°F | Resp 17

## 2023-12-30 DIAGNOSIS — C3411 Malignant neoplasm of upper lobe, right bronchus or lung: Secondary | ICD-10-CM

## 2023-12-30 DIAGNOSIS — Z5112 Encounter for antineoplastic immunotherapy: Secondary | ICD-10-CM | POA: Diagnosis not present

## 2023-12-30 MED ORDER — SODIUM CHLORIDE 0.9% FLUSH
10.0000 mL | INTRAVENOUS | Status: DC | PRN
  Administered 2023-12-30: 10 mL

## 2023-12-30 MED ORDER — DEXAMETHASONE SODIUM PHOSPHATE 10 MG/ML IJ SOLN
10.0000 mg | Freq: Once | INTRAMUSCULAR | Status: AC
  Administered 2023-12-30: 10 mg via INTRAVENOUS
  Filled 2023-12-30: qty 1

## 2023-12-30 MED ORDER — SODIUM CHLORIDE 0.9 % IV SOLN: INTRAVENOUS | Status: DC

## 2023-12-30 MED ORDER — SODIUM CHLORIDE 0.9 % IV SOLN
80.0000 mg/m2 | Freq: Once | INTRAVENOUS | Status: AC
  Administered 2023-12-30: 144 mg via INTRAVENOUS
  Filled 2023-12-30: qty 7.2

## 2023-12-30 MED ORDER — HEPARIN SOD (PORK) LOCK FLUSH 100 UNIT/ML IV SOLN
500.0000 [IU] | Freq: Once | INTRAVENOUS | Status: AC | PRN
  Administered 2023-12-30: 500 [IU]

## 2023-12-30 NOTE — Progress Notes (Signed)
Nutrition Follow Up: Reached out to patient at mobile telephone number during infusion.    Reason for Assessment: MST screen for weight loss and follow up on IP RD malnutrition Patient is 58 year old with history of COPD, HTN, RA, recently diagnosed with invasive small cell carcinoma on chemotherapy and radiation recently admitted to the hospital for severe dysphagia, failure to thrive and hypocalcemia. The pain with swallowing developed following radiation treatments. Patient also has skin integrity Issues: Stage II: IT, coccyx, not taking any wound supplements at this time. Patient reports she is eating a bit better since her discharge and that her appetite is picking up. Spouse cooked fried chicken and green beans and she ate so fast had little choking, chicken pot pie worked well, Medical sales representative didn't work well at all. Eats one good meal per day and nibbles on fruit snacks like applesauce and fruits cups throughout the day.  Fluids: Juice, Ice tea, Protein drink(Premiere). Loves milk, not drinking water only likes it ice cold.   Medications: Vit D, Folvite   Labs: 12/29/23  K+ 3.4, Ca + 8.5, Albumin 8.5, Hgb 11.8   Anthropometrics: 20# loss past month significant for timeframe, would like to regain some  Height: 66# Weight:  12/29/23  135# 11/25/23  155# UBW: 160-165# BMI: 21.81   Estimated Energy Needs  Kcal:  1850-2050   Protein:  90-100g   Fluid:  2L/day   NUTRITION DIAGNOSIS: Moderate Malnutrition related to chronic illness, cancer and cancer related treatments as evidenced by mild fat depletion, mild muscle depletion, energy intake < 75% for > 7 days, percent weight loss.    MALNUTRITION DIAGNOSIS: Non-severe (moderate) malnutrition in context of chronic illness    INTERVENTION:   Relayed that nutrition services are wrap around service provided at no charge and encouraged continued communication if experiencing any nutritional impact symptoms (NIS). Educated on importance of  adequate nourishment with calorie and protein energy intake with nutrient dense foods when possible to maintain weight and promote healing  Discussed ways to add protein to intake (high protein milk, yogurt (she's willing to try) increase in ONS to BID)  Encouraged soft moist high protein foods as well as small frequent meals/snacks with protein added to fruit snacks. Suggested Ensure max cappuccino and suggested she get coupons from cancer center. Encouraged MVI daily to aid in wound healing Encouraged weighing herself weekly to monitor progress. Emailed Nutrition Tip sheet  for Soft moist protein foods, high protein chocolate pudding recipe and high protein milk recipe with contact  information provided.  MONITORING, EVALUATION, GOAL: weight trends, nutrition impact symptoms, PO intake, labs  Goal weight gain 2-4#/month  Next Visit: Remote 2 weeks  Gennaro Africa, RDN, LDN Registered Dietitian, [facility] Part Time Remote (Usual office hours: Tuesday-Thursday) Mobile: [phone]

## 2023-12-30 NOTE — Progress Notes (Signed)
Patient's PET scheduled for 01/21/2024.  Patient is aware of PET appointment including date, time, and location. The following prep is reviewed with patient and confirmed with teachback: - arrive 30 minutes before appointment time - NPO except water for 6h before scan. No candy, no gum - hold any diabetic medication the morning of the scan - have a low carb dinner the night prior Radiology Information sheet also reviewed and given to patient for reinforcement of education.  Oncology Nurse Navigator Documentation     12/30/2023    9:30 AM  Oncology Nurse Navigator Flowsheets  Navigator Follow Up Date: 01/21/2024  Navigator Follow Up Reason: Scan Review  Navigator Location [facility]  Navigator Encounter Type Treatment  Patient Visit Type MedOnc  Treatment Phase Active Tx  Barriers/Navigation Needs Coordination of Care  Interventions Coordination of Care;Education  Acuity Level 2-Minimal Needs (1-2 Barriers Identified)  Coordination of Care Radiology  Education Method Verbal;Written  Support Groups/Services Friends and Family  Time Spent with Patient 30

## 2023-12-30 NOTE — Patient Instructions (Signed)
[facility] - A DEPT OF [facility]  Discharge Instructions: Thank you for choosing [facility] to provide your oncology and hematology care.   If you have a lab appointment with the Cancer Center, please go directly to the Cancer Center and check in at the registration area.  Wear comfortable clothing and clothing appropriate for easy access to any Portacath or PICC line.   We strive to give you quality time with your provider. You may need to reschedule your appointment if you arrive late (15 or more minutes).  Arriving late affects you and other patients whose appointments are after yours.  Also, if you miss three or more appointments without notifying the office, you may be dismissed from the clinic at the provider's discretion.      For prescription refill requests, have your pharmacy contact our office and allow 72 hours for refills to be completed.    Today you received the following chemotherapy and/or immunotherapy agents VP 16      To help prevent nausea and vomiting after your treatment, we encourage you to take your nausea medication as directed.  BELOW ARE SYMPTOMS THAT SHOULD BE REPORTED IMMEDIATELY: *FEVER GREATER THAN 100.4 F (38 C) OR HIGHER *CHILLS OR SWEATING *NAUSEA AND VOMITING THAT IS NOT CONTROLLED WITH YOUR NAUSEA MEDICATION *UNUSUAL SHORTNESS OF BREATH *UNUSUAL BRUISING OR BLEEDING *URINARY PROBLEMS (pain or burning when urinating, or frequent urination) *BOWEL PROBLEMS (unusual diarrhea, constipation, pain near the anus) TENDERNESS IN MOUTH AND THROAT WITH OR WITHOUT PRESENCE OF ULCERS (sore throat, sores in mouth, or a toothache) UNUSUAL RASH, SWELLING OR PAIN  UNUSUAL VAGINAL DISCHARGE OR ITCHING   Items with * indicate a potential emergency and should be followed up as soon as possible or go to the Emergency Department if any problems should occur.  Please show the CHEMOTHERAPY ALERT CARD or IMMUNOTHERAPY ALERT  CARD at check-in to the Emergency Department and triage nurse. Should you have questions after your visit or need to cancel or reschedule your appointment, please contact [facility] - A DEPT OF [facility]  [phone] and follow the prompts.  Office hours are 8:00 a.m. to 4:30 p.m. Monday - Friday. Please note that voicemails left after 4:00 p.m. may not be returned until the following business day.  We are closed weekends and major holidays. You have access to a nurse at all times for urgent questions. Please call the main number to the clinic [phone] and follow the prompts.  For any non-urgent questions, you may also contact your provider using MyChart. We now offer e-Visits for anyone 51 and older to request care online for non-urgent symptoms. For details visit [email or web address].   Also download the MyChart app! Go to the app store, search "MyChart", open the app, select [place], and log in with your MyChart username and password.

## 2023-12-31 ENCOUNTER — Inpatient Hospital Stay: Payer: 59

## 2023-12-31 ENCOUNTER — Other Ambulatory Visit: Payer: Self-pay

## 2023-12-31 VITALS — BP 160/82 | HR 63 | Temp 97.7°F | Resp 17

## 2023-12-31 DIAGNOSIS — Z5112 Encounter for antineoplastic immunotherapy: Secondary | ICD-10-CM | POA: Diagnosis not present

## 2023-12-31 DIAGNOSIS — C3411 Malignant neoplasm of upper lobe, right bronchus or lung: Secondary | ICD-10-CM

## 2023-12-31 MED ORDER — SODIUM CHLORIDE 0.9 % IV SOLN
80.0000 mg/m2 | Freq: Once | INTRAVENOUS | Status: AC
  Administered 2023-12-31: 144 mg via INTRAVENOUS
  Filled 2023-12-31: qty 7.2

## 2023-12-31 MED ORDER — SODIUM CHLORIDE 0.9% FLUSH
10.0000 mL | INTRAVENOUS | Status: DC | PRN
  Administered 2023-12-31: 10 mL

## 2023-12-31 MED ORDER — HEPARIN SOD (PORK) LOCK FLUSH 100 UNIT/ML IV SOLN
500.0000 [IU] | Freq: Once | INTRAVENOUS | Status: AC | PRN
  Administered 2023-12-31: 500 [IU]

## 2023-12-31 MED ORDER — DEXAMETHASONE SODIUM PHOSPHATE 10 MG/ML IJ SOLN
10.0000 mg | Freq: Once | INTRAMUSCULAR | Status: AC
  Administered 2023-12-31: 10 mg via INTRAVENOUS
  Filled 2023-12-31: qty 1

## 2023-12-31 MED ORDER — SODIUM CHLORIDE 0.9 % IV SOLN: INTRAVENOUS | Status: DC

## 2023-12-31 NOTE — Patient Instructions (Signed)
[facility]  Discharge Instructions: Thank you for choosing [facility] to provide your oncology and hematology care.   If you have a lab appointment with the Cancer Center, please go directly to the Cancer Center and check in at the registration area.  Wear comfortable clothing and clothing appropriate for easy access to any Portacath or PICC line.   We strive to give you quality time with your provider. You may need to reschedule your appointment if you arrive late (15 or more minutes).  Arriving late affects you and other patients whose appointments are after yours.  Also, if you miss three or more appointments without notifying the office, you may be dismissed from the clinic at the provider's discretion.      For prescription refill requests, have your pharmacy contact our office and allow 72 hours for refills to be completed.    Today you received the following chemotherapy and/or immunotherapy agents Vepesid.      To help prevent nausea and vomiting after your treatment, we encourage you to take your nausea medication as directed.  BELOW ARE SYMPTOMS THAT SHOULD BE REPORTED IMMEDIATELY: *FEVER GREATER THAN 100.4 F (38 C) OR HIGHER *CHILLS OR SWEATING *NAUSEA AND VOMITING THAT IS NOT CONTROLLED WITH YOUR NAUSEA MEDICATION *UNUSUAL SHORTNESS OF BREATH *UNUSUAL BRUISING OR BLEEDING *URINARY PROBLEMS (pain or burning when urinating, or frequent urination) *BOWEL PROBLEMS (unusual diarrhea, constipation, pain near the anus) TENDERNESS IN MOUTH AND THROAT WITH OR WITHOUT PRESENCE OF ULCERS (sore throat, sores in mouth, or a toothache) UNUSUAL RASH, SWELLING OR PAIN  UNUSUAL VAGINAL DISCHARGE OR ITCHING   Items with * indicate a potential emergency and should be followed up as soon as possible or go to the Emergency Department if any problems should occur.  Please show the CHEMOTHERAPY ALERT CARD or IMMUNOTHERAPY  ALERT CARD at check-in to the Emergency Department and triage nurse. Should you have questions after your visit or need to cancel or reschedule your appointment, please contact [facility] [place] - A DEPT OF [facility]  [phone] and follow the prompts.  Office hours are 8:00 a.m. to 4:30 p.m. Monday - Friday. Please note that voicemails left after 4:00 p.m. may not be returned until the following business day.  We are closed weekends and major holidays. You have access to a nurse at all times for urgent questions. Please call the main number to the clinic [phone] and follow the prompts.  For any non-urgent questions, you may also contact your provider using MyChart. We now offer e-Visits for anyone 72 and older to request care online for non-urgent symptoms. For details visit [email or web address].   Also download the MyChart app! Go to the app store, search "MyChart", open the app, select [place], and log in with your MyChart username and password.

## 2024-01-03 ENCOUNTER — Inpatient Hospital Stay: Payer: 59 | Attending: Hematology & Oncology

## 2024-01-03 VITALS — BP 132/70 | HR 93 | Temp 97.9°F | Resp 18

## 2024-01-03 DIAGNOSIS — Z5112 Encounter for antineoplastic immunotherapy: Secondary | ICD-10-CM | POA: Insufficient documentation

## 2024-01-03 DIAGNOSIS — Z5111 Encounter for antineoplastic chemotherapy: Secondary | ICD-10-CM | POA: Diagnosis present

## 2024-01-03 DIAGNOSIS — C3411 Malignant neoplasm of upper lobe, right bronchus or lung: Secondary | ICD-10-CM

## 2024-01-03 DIAGNOSIS — C3490 Malignant neoplasm of unspecified part of unspecified bronchus or lung: Secondary | ICD-10-CM | POA: Insufficient documentation

## 2024-01-03 DIAGNOSIS — Z79899 Other long term (current) drug therapy: Secondary | ICD-10-CM | POA: Insufficient documentation

## 2024-01-03 MED ORDER — PEGFILGRASTIM-CBQV 6 MG/0.6ML ~~LOC~~ SOSY
6.0000 mg | PREFILLED_SYRINGE | Freq: Once | SUBCUTANEOUS | Status: AC
  Administered 2024-01-03: 6 mg via SUBCUTANEOUS
  Filled 2024-01-03: qty 0.6

## 2024-01-03 NOTE — Patient Instructions (Signed)

## 2024-01-13 ENCOUNTER — Inpatient Hospital Stay: Payer: 59 | Admitting: Dietician

## 2024-01-13 ENCOUNTER — Encounter: Payer: 59 | Admitting: Dietician

## 2024-01-13 ENCOUNTER — Telehealth: Payer: Self-pay | Admitting: Dietician

## 2024-01-13 NOTE — Telephone Encounter (Signed)
Attempted to reach patient for a scheduled remote nutrition consult. Earlier in the day she asked me to call back in the afternoon.  Provided my cell# on voice mail to return call for her follow up nutrition consult.  Gennaro Africa, RDN, LDN Registered Dietitian, [facility] Part Time Remote (Usual office hours: Tuesday-Thursday) Cell: [phone]

## 2024-01-21 ENCOUNTER — Encounter (HOSPITAL_COMMUNITY)
Admission: RE | Admit: 2024-01-21 | Discharge: 2024-01-21 | Disposition: A | Discharge status: Home / Self Care | Payer: 59 | Source: Ambulatory Visit | Attending: Hematology & Oncology | Admitting: Hematology & Oncology

## 2024-01-21 DIAGNOSIS — C3411 Malignant neoplasm of upper lobe, right bronchus or lung: Secondary | ICD-10-CM | POA: Insufficient documentation

## 2024-01-21 LAB — GLUCOSE, CAPILLARY: Glucose-Capillary: 100 mg/dL — ABNORMAL HIGH (ref 70–99)

## 2024-01-21 MED ORDER — FLUDEOXYGLUCOSE F - 18 (FDG) INJECTION
7.4000 | Freq: Once | INTRAVENOUS | Status: AC | PRN
  Administered 2024-01-21: 6.7 via INTRAVENOUS

## 2024-01-24 ENCOUNTER — Ambulatory Visit
Admit: 2024-01-24 | Discharge: 2024-01-24 | Disposition: A | Discharge status: Home / Self Care | Payer: 59 | Attending: Internal Medicine | Admitting: Internal Medicine

## 2024-01-24 NOTE — Progress Notes (Signed)
.   Radiation Oncology         [phone]) [phone] ________________________________  Name: Tammy Cordova MRN: [id number]  Date of Service: 01/24/2024  DOB: 04-18-1966  Post Treatment Telephone Note  Diagnosis:  Extensive Stage Small Cell Carcinoma of the RUL (as documented in provider EOT note)  The patient was not available for call today. Voicemail unavailable.  The patient has scheduled follow up with her medical oncologist Dr. Myna Hidalgo for ongoing care, and was encouraged to call if she develops concerns or questions regarding radiation.    Ruel Favors, LPN

## 2024-01-26 ENCOUNTER — Inpatient Hospital Stay (HOSPITAL_BASED_OUTPATIENT_CLINIC_OR_DEPARTMENT_OTHER): Payer: 59 | Admitting: Hematology & Oncology

## 2024-01-26 ENCOUNTER — Inpatient Hospital Stay: Payer: 59

## 2024-01-26 ENCOUNTER — Encounter: Payer: Self-pay | Admitting: Hematology & Oncology

## 2024-01-26 ENCOUNTER — Other Ambulatory Visit: Payer: Self-pay

## 2024-01-26 ENCOUNTER — Encounter: Payer: Self-pay | Admitting: *Deleted

## 2024-01-26 VITALS — BP 145/75 | HR 72 | Temp 98.5°F | Resp 18 | Ht 66.0 in | Wt 135.0 lb

## 2024-01-26 DIAGNOSIS — C3411 Malignant neoplasm of upper lobe, right bronchus or lung: Secondary | ICD-10-CM

## 2024-01-26 DIAGNOSIS — Z5112 Encounter for antineoplastic immunotherapy: Secondary | ICD-10-CM | POA: Diagnosis not present

## 2024-01-26 LAB — CBC WITH DIFFERENTIAL (CANCER CENTER ONLY)
Abs Immature Granulocytes: 0.02 10*3/uL (ref 0.00–0.07)
Basophils Absolute: 0 10*3/uL (ref 0.0–0.1)
Basophils Relative: 1 %
Eosinophils Absolute: 0 10*3/uL (ref 0.0–0.5)
Eosinophils Relative: 1 %
HCT: 33 % — ABNORMAL LOW (ref 36.0–46.0)
Hemoglobin: 10.5 g/dL — ABNORMAL LOW (ref 12.0–15.0)
Immature Granulocytes: 1 %
Lymphocytes Relative: 19 %
Lymphs Abs: 0.7 10*3/uL (ref 0.7–4.0)
MCH: 28.2 pg (ref 26.0–34.0)
MCHC: 31.8 g/dL (ref 30.0–36.0)
MCV: 88.7 fL (ref 80.0–100.0)
Monocytes Absolute: 0.3 10*3/uL (ref 0.1–1.0)
Monocytes Relative: 8 %
Neutro Abs: 2.5 10*3/uL (ref 1.7–7.7)
Neutrophils Relative %: 70 %
Platelet Count: 261 10*3/uL (ref 150–400)
RBC: 3.72 MIL/uL — ABNORMAL LOW (ref 3.87–5.11)
RDW: 19.9 % — ABNORMAL HIGH (ref 11.5–15.5)
WBC Count: 3.6 10*3/uL — ABNORMAL LOW (ref 4.0–10.5)
nRBC: 0 % (ref 0.0–0.2)

## 2024-01-26 LAB — CMP (CANCER CENTER ONLY)
ALT: <5 U/L (ref 0–44)
AST: 9 U/L — ABNORMAL LOW (ref 15–41)
Albumin: 3.4 g/dL — ABNORMAL LOW (ref 3.5–5.0)
Alkaline Phosphatase: 67 U/L (ref 38–126)
Anion gap: 6 (ref 5–15)
BUN: 5 mg/dL — ABNORMAL LOW (ref 6–20)
CO2: 28 mmol/L (ref 22–32)
Calcium: 9.1 mg/dL (ref 8.9–10.3)
Chloride: 105 mmol/L (ref 98–111)
Creatinine: 0.53 mg/dL (ref 0.44–1.00)
GFR, Estimated: >60 mL/min (ref >60)
Glucose, Bld: 99 mg/dL (ref 70–99)
Potassium: 3.5 mmol/L (ref 3.5–5.1)
Sodium: 139 mmol/L (ref 135–145)
Total Bilirubin: 0.4 mg/dL (ref 0.0–1.2)
Total Protein: 6.9 g/dL (ref 6.5–8.1)

## 2024-01-26 LAB — LACTATE DEHYDROGENASE: LDH: 279 U/L — ABNORMAL HIGH (ref 98–192)

## 2024-01-26 MED ORDER — DEXAMETHASONE SODIUM PHOSPHATE 10 MG/ML IJ SOLN
10.0000 mg | Freq: Once | INTRAMUSCULAR | Status: AC
  Administered 2024-01-26: 10 mg via INTRAVENOUS
  Filled 2024-01-26: qty 1

## 2024-01-26 MED ORDER — SODIUM CHLORIDE 0.9 % IV SOLN
150.0000 mg | Freq: Once | INTRAVENOUS | Status: AC
  Administered 2024-01-26: 150 mg via INTRAVENOUS
  Filled 2024-01-26: qty 150

## 2024-01-26 MED ORDER — SODIUM CHLORIDE 0.9 % IV SOLN
1200.0000 mg | Freq: Once | INTRAVENOUS | Status: AC
  Administered 2024-01-26: 1200 mg via INTRAVENOUS
  Filled 2024-01-26: qty 20

## 2024-01-26 MED ORDER — PALONOSETRON HCL INJECTION 0.25 MG/5ML
0.2500 mg | Freq: Once | INTRAVENOUS | Status: AC
  Administered 2024-01-26: 0.25 mg via INTRAVENOUS
  Filled 2024-01-26: qty 5

## 2024-01-26 MED ORDER — SODIUM CHLORIDE 0.9% FLUSH: 10.0000 mL | INTRAVENOUS | Status: DC | PRN

## 2024-01-26 MED ORDER — DRONABINOL 2.5 MG PO CAPS: 2.5000 mg | ORAL_CAPSULE | Freq: Two times a day (BID) | ORAL | 0 refills | Status: DC

## 2024-01-26 MED ORDER — HEPARIN SOD (PORK) LOCK FLUSH 100 UNIT/ML IV SOLN: 500.0000 [IU] | Freq: Once | INTRAVENOUS | Status: DC | PRN

## 2024-01-26 MED ORDER — LEVOFLOXACIN 500 MG PO TABS: 500.0000 mg | ORAL_TABLET | Freq: Every day | ORAL | 6 refills | Status: DC

## 2024-01-26 MED ORDER — SODIUM CHLORIDE 0.9 % IV SOLN
80.0000 mg/m2 | Freq: Once | INTRAVENOUS | Status: AC
  Administered 2024-01-26: 144 mg via INTRAVENOUS
  Filled 2024-01-26: qty 7.2

## 2024-01-26 MED ORDER — SODIUM CHLORIDE 0.9 % IV SOLN
450.0000 mg | Freq: Once | INTRAVENOUS | Status: AC
  Administered 2024-01-26: 450 mg via INTRAVENOUS
  Filled 2024-01-26: qty 45

## 2024-01-26 MED ORDER — SODIUM CHLORIDE 0.9 % IV SOLN: INTRAVENOUS | Status: DC

## 2024-01-26 NOTE — Progress Notes (Signed)
 Hematology and Oncology Follow Up Visit  Tammy Cordova [id number] 09-01-1966 58 y.o. 01/26/2024   Principle Diagnosis:  Extensive stage small cell lung cancer --TMB=23  Current Therapy:   Status post cycle 1 of carboplatinum/etoposide + XRT s/p cycle #2 -- start.  On 12/04/2023     Interim History:  Tammy Cordova is back for follow-up.  She is doing fairly well.  She had a hard time with the Neulasta.  We will go ahead and stop the Neulasta.  I do not have the PET scan result back yet.  However, when I looked at the PET scan, the there was complete response in the mediastinum.  I am very happy for her.  She seems to be doing okay.  She is swallowing okay.  She says that there is still might be a little bit of discomfort when she swallows.  She is having no problems with breathing.  She has had no cough.  She has had no bleeding.  She has had no nausea or vomiting.  There is been no change in bowel or bladder habits.  She has had no headache.  Overall, I would have said that her performance status ECOG 1.    Medications:  Current Outpatient Medications:    albuterol (VENTOLIN HFA) 108 (90 Base) MCG/ACT inhaler, Inhale 2 puffs into the lungs every 6 (six) hours as needed for wheezing or shortness of breath. (Patient not taking: Reported on 12/30/2023), Disp: , Rfl:    dexamethasone (DECADRON) 4 MG tablet, Take 2 tablets (8 mg total) by mouth daily. Take 2 tablets by mouth starting the day after last dose of etoposide for one day only.  Repeat every 21 days, Disp: 60 tablet, Rfl: 2   dronabinol ([name]) 2.5 MG capsule, Take 1 capsule (2.5 mg total) by mouth 2 (two) times daily before lunch and supper., Disp: 14 capsule, Rfl: 0   folic acid (FOLVITE) 1 MG tablet, Take 1 tablet (1 mg total) by mouth daily., Disp: 30 tablet, Rfl: 3   HYDROcodone-acetaminophen (NORCO/VICODIN) 5-325 MG tablet, Take 1 tablet by mouth every 6 (six) hours as needed for moderate pain (pain score 4-6)., Disp: 20  tablet, Rfl: 0   levofloxacin (LEVAQUIN) 500 MG tablet, Take 1 tablet (500 mg total) by mouth daily. Start taking Levaquin daily starting on 01/01/2024, Disp: 15 tablet, Rfl: 6   lidocaine (XYLOCAINE) 2 % solution, Use as directed 15 mLs in the mouth or throat every 6 (six) hours as needed for mouth pain. Please take 15 cc (1 tablespoon) half hour before you try to eat., Disp: 300 mL, Rfl: 4   ondansetron (ZOFRAN) 8 MG tablet, Take 1 tablet (8 mg total) by mouth every 8 (eight) hours as needed for nausea or vomiting. Start on third day after Carboplatin, Disp: 30 tablet, Rfl: 1   pantoprazole (PROTONIX) 40 MG tablet, Take 40 mg by mouth every morning., Disp: , Rfl:    prochlorperazine (COMPAZINE) 10 MG tablet, Take 1 tablet (10 mg total) by mouth every 6 (six) hours as needed for nausea or vomiting., Disp: 30 tablet, Rfl: 1   sucralfate (CARAFATE) 1 GM/10ML suspension, Take 10 mLs (1 g total) by mouth 4 (four) times daily -  with meals and at bedtime., Disp: 420 mL, Rfl: 0   Vitamin D, Ergocalciferol, (DRISDOL) 1.25 MG (50000 UNIT) CAPS capsule, Take 1 capsule (50,000 Units total) by mouth every 7 (seven) days., Disp: 4 capsule, Rfl: 2  Allergies: No Known Allergies  Past Medical  History, Surgical history, Social history, and Family History were reviewed and updated.  Review of Systems: Review of Systems  Constitutional:  Positive for appetite change and unexpected weight change.  HENT:   Positive for sore throat.   Eyes: Negative.   Respiratory: Negative.    Cardiovascular: Negative.   Gastrointestinal:  Positive for nausea.  Endocrine: Negative.   Genitourinary: Negative.    Musculoskeletal: Negative.   Skin: Negative.   Neurological:  Positive for dizziness.  Hematological: Negative.   Psychiatric/Behavioral: Negative.      Physical Exam: Vital signs show temperature of 98.5.  Pulse 72.  Blood pressure 147/75.  Weight is 135 pounds.  Wt Readings from Last 3 Encounters:  12/29/23  135 lb 1.9 oz (61.3 kg)  12/17/23 138 lb (62.6 kg)  12/17/23 138 lb (62.6 kg)    Physical Exam Vitals reviewed.  HENT:     Head: Normocephalic and atraumatic.  Eyes:     Pupils: Pupils are equal, round, and reactive to light.  Cardiovascular:     Rate and Rhythm: Normal rate and regular rhythm.     Heart sounds: Normal heart sounds.  Pulmonary:     Effort: Pulmonary effort is normal.     Breath sounds: Normal breath sounds.  Abdominal:     General: Bowel sounds are normal.     Palpations: Abdomen is soft.  Musculoskeletal:        General: No tenderness or deformity. Normal range of motion.     Cervical back: Normal range of motion.  Lymphadenopathy:     Cervical: No cervical adenopathy.  Skin:    General: Skin is warm and dry.     Findings: No erythema or rash.  Neurological:     Mental Status: She is alert and oriented to person, place, and time.  Psychiatric:        Behavior: Behavior normal.        Thought Content: Thought content normal.        Judgment: Judgment normal.      Lab Results  Component Value Date   WBC 3.6 (L) 01/26/2024   HGB 10.5 (L) 01/26/2024   HCT 33.0 (L) 01/26/2024   MCV 88.7 01/26/2024   PLT 261 01/26/2024     Chemistry      Component Value Date/Time   NA 139 01/26/2024 0840   K 3.5 01/26/2024 0840   CL 105 01/26/2024 0840   CO2 28 01/26/2024 0840   BUN 5 (L) 01/26/2024 0840   CREATININE 0.53 01/26/2024 0840      Component Value Date/Time   CALCIUM 9.1 01/26/2024 0840   ALKPHOS 67 01/26/2024 0840   AST 9 (L) 01/26/2024 0840   ALT <5 01/26/2024 0840   BILITOT 0.4 01/26/2024 0840       Impression and Plan: Tammy Cordova is a very charming 58 year old Afro-American female with extensive stage small cell lung cancer.  Because of the bulk of her mediastinal, we went ahead and gave radiation with chemotherapy.  I guess she had a very nice response at least by my reading of the PET scan.  We will go ahead with her third cycle of  treatment.  I am just happy that she is able to swallow better.  I think that the radiation esophagitis has resolved.  I think another indication of her responses fact that she had no longer is hypocalcemic.  Her albumin is coming up.  Her calcium is coming up.  We will go ahead and plan  for a fourth cycle of treatment.  I will have her come back in 3 weeks for this.  I will go ahead and put her on empiric antibiotics because she will not be on Neulasta.    [name], MD 2/26/202510:23 AM

## 2024-01-26 NOTE — Progress Notes (Signed)
 Continue Carboplatin 450mg  today (AUC 4.5) per MD.  Richardean Sale, RPH, BCPS, BCOP 01/26/2024 11:31 AM

## 2024-01-26 NOTE — Patient Instructions (Addendum)
 Etoposide Capsules What is this medication? ETOPOSIDE (e toe POE side) treats lung cancer. It works by slowing down the growth of cancer cells. This medicine may be used for other purposes; ask your health care provider or pharmacist if you have questions. COMMON BRAND NAME(S): VePesid What should I tell my care team before I take this medication? They need to know if you have any of these conditions: Infection Kidney disease Liver disease Low blood counts, such as low white cell, platelet, red cell counts An unusual or allergic reaction to etoposide, other medications, foods, dyes, or preservatives Pregnant or trying to get pregnant Breastfeeding How should I use this medication? Take this medication by mouth with a glass of water. Take it as directed on the prescription label. Do not cut, crush, or chew this medication. Swallow the capsules whole. Do not take it more often than directed. Keep taking it unless your care team tells you to stop. Handling this medication may be harmful. Wear gloves while touching this medication or bottle. Talk to your care team about how to handle this medication. Special instructions may apply. Talk to your care team about the use of this medication in children. Special care may be needed. Overdosage: If you think you have taken too much of this medicine contact a poison control center or emergency room at once. NOTE: This medicine is only for you. Do not share this medicine with others. What if I miss a dose? If you miss a dose, take it as soon as you can. If it is almost time for your next dose, take only that dose. Do not take double or extra doses. What may interact with this medication? Cyclosporine Warfarin This list may not describe all possible interactions. Give your health care provider a list of all the medicines, herbs, non-prescription drugs, or dietary supplements you use. Also tell them if you smoke, drink alcohol, or use illegal drugs. Some  items may interact with your medicine. What should I watch for while using this medication? Your condition will be monitored carefully while you are receiving this medication. This medication may make you feel generally unwell. This is not uncommon as chemotherapy can affect healthy cells as well as cancer cells. Report any side effects. Continue your course of treatment even though you feel ill unless your care team tells you to stop. This medication may increase your risk of getting an infection. Call your care team for advice if you get a fever, chills, sore throat, or other symptoms of a cold or flu. Do not treat yourself. Try to avoid being around people who are sick. This medication may increase your risk to bruise or bleed. Call your care team if you notice any unusual bleeding. Talk to your care team about your risk of cancer. You may be more at risk for certain types of cancers if you take this medication. Talk to your care team if you may be pregnant. Serious birth defects can occur if you take this medication during pregnancy and for 6 months after the last dose. You will need a negative pregnancy test before starting this medication. Contraception is recommended while taking this medication and for 6 months after the last dose. Your care team can help you find the option that works for you. If your partner can get pregnant, use a condom during sex while taking this medication and for 4 months after the last dose. Do not breastfeed while taking this medication. This medication may cause infertility. Talk  to your care team if you are concerned about your fertility. What side effects may I notice from receiving this medication? Side effects that you should report to your care team as soon as possible: Allergic reactions--skin rash, itching, hives, swelling of the face, lips, tongue, or throat Infection--fever, chills, cough, sore throat, wounds that don't heal, pain or trouble when passing  urine, general feeling of discomfort or being unwell Low red blood cell level--unusual weakness or fatigue, dizziness, headache, trouble breathing Unusual bruising or bleeding Side effects that usually do not require medical attention (report to your care team if they continue or are bothersome): Diarrhea Fatigue Hair loss Loss of appetite Nausea Pain, redness, or swelling with sores inside the mouth or throat Vomiting This list may not describe all possible side effects. Call your doctor for medical advice about side effects. You may report side effects to FDA at 1-800-FDA-1088. Where should I keep my medication? Keep out of the reach of children and pets. Store in a refrigerator between 2 and 8 degrees C (36 and 46 degrees F). Do not freeze. Get rid any unused medication after the expiration date. To get rid of medications that are no longer needed or have expired: Take the medication to a medication take-back program. Check with your pharmacy or law enforcement to find a location. If you cannot return the medication, ask your pharmacist or care team how to get rid of this medication safely. NOTE: This sheet is a summary. It may not cover all possible information. If you have questions about this medicine, talk to your doctor, pharmacist, or health care provider.  2024 Elsevier/Gold Standard (2022-04-09 00:00:00) Carboplatin Injection What is this medication? CARBOPLATIN (KAR boe pla tin) treats some types of cancer. It works by slowing down the growth of cancer cells. This medicine may be used for other purposes; ask your health care provider or pharmacist if you have questions. COMMON BRAND NAME(S): Paraplatin What should I tell my care team before I take this medication? They need to know if you have any of these conditions: Blood disorders Hearing problems Kidney disease Recent or ongoing radiation therapy An unusual or allergic reaction to carboplatin, cisplatin, other  medications, foods, dyes, or preservatives Pregnant or trying to get pregnant Breast-feeding How should I use this medication? This medication is injected into a vein. It is given by your care team in a hospital or clinic setting. Talk to your care team about the use of this medication in children. Special care may be needed. Overdosage: If you think you have taken too much of this medicine contact a poison control center or emergency room at once. NOTE: This medicine is only for you. Do not share this medicine with others. What if I miss a dose? Keep appointments for follow-up doses. It is important not to miss your dose. Call your care team if you are unable to keep an appointment. What may interact with this medication? Medications for seizures Some antibiotics, such as amikacin, gentamicin, neomycin, streptomycin, tobramycin Vaccines This list may not describe all possible interactions. Give your health care provider a list of all the medicines, herbs, non-prescription drugs, or dietary supplements you use. Also tell them if you smoke, drink alcohol, or use illegal drugs. Some items may interact with your medicine. What should I watch for while using this medication? Your condition will be monitored carefully while you are receiving this medication. You may need blood work while taking this medication. This medication may make you feel  generally unwell. This is not uncommon, as chemotherapy can affect healthy cells as well as cancer cells. Report any side effects. Continue your course of treatment even though you feel ill unless your care team tells you to stop. In some cases, you may be given additional medications to help with side effects. Follow all directions for their use. This medication may increase your risk of getting an infection. Call your care team for advice if you get a fever, chills, sore throat, or other symptoms of a cold or flu. Do not treat yourself. Try to avoid being  around people who are sick. Avoid taking medications that contain aspirin, acetaminophen, ibuprofen, naproxen, or ketoprofen unless instructed by your care team. These medications may hide a fever. Be careful brushing or flossing your teeth or using a toothpick because you may get an infection or bleed more easily. If you have any dental work done, tell your dentist you are receiving this medication. Talk to your care team if you wish to become pregnant or think you might be pregnant. This medication can cause serious birth defects. Talk to your care team about effective forms of contraception. Do not breast-feed while taking this medication. What side effects may I notice from receiving this medication? Side effects that you should report to your care team as soon as possible: Allergic reactions--skin rash, itching, hives, swelling of the face, lips, tongue, or throat Infection--fever, chills, cough, sore throat, wounds that don't heal, pain or trouble when passing urine, general feeling of discomfort or being unwell Low red blood cell level--unusual weakness or fatigue, dizziness, headache, trouble breathing Pain, tingling, or numbness in the hands or feet, muscle weakness, change in vision, confusion or trouble speaking, loss of balance or coordination, trouble walking, seizures Unusual bruising or bleeding Side effects that usually do not require medical attention (report to your care team if they continue or are bothersome): Hair loss Nausea Unusual weakness or fatigue Vomiting This list may not describe all possible side effects. Call your doctor for medical advice about side effects. You may report side effects to FDA at 1-800-FDA-1088. Where should I keep my medication? This medication is given in a hospital or clinic. It will not be stored at home. NOTE: This sheet is a summary. It may not cover all possible information. If you have questions about this medicine, talk to your doctor,  pharmacist, or health care provider.  2024 Elsevier/Gold Standard (2022-03-10 00:00:00) Atezolizumab Injection What is this medication? ATEZOLIZUMAB (a te zoe LIZ ue mab) treats some types of cancer. It works by helping your immune system slow or stop the spread of cancer cells. It is a monoclonal antibody. This medicine may be used for other purposes; ask your health care provider or pharmacist if you have questions. COMMON BRAND NAME(S): Tecentriq What should I tell my care team before I take this medication? They need to know if you have any of these conditions: Allogeneic stem cell transplant (uses someone else's stem cells) Autoimmune diseases, such as Crohn disease, ulcerative colitis, lupus History of chest radiation Nervous system problems, such as Guillain-Barre syndrome, myasthenia gravis Organ transplant An unusual or allergic reaction to atezolizumab, other medications, foods, dyes, or preservatives Pregnant or trying to get pregnant Breast-feeding How should I use this medication? This medication is injected into a vein. It is given by your care team in a hospital or clinic setting. A special MedGuide will be given to you before each treatment. Be sure to read this information carefully  each time. Talk to your care team about the use of this medication in children. While it may be prescribed for children as young as 2 years for selected conditions, precautions do apply. Overdosage: If you think you have taken too much of this medicine contact a poison control center or emergency room at once. NOTE: This medicine is only for you. Do not share this medicine with others. What if I miss a dose? Keep appointments for follow-up doses. It is important not to miss your dose. Call your care team if you are unable to keep an appointment. What may interact with this medication? Interactions have not been studied. This list may not describe all possible interactions. Give your health  care provider a list of all the medicines, herbs, non-prescription drugs, or dietary supplements you use. Also tell them if you smoke, drink alcohol, or use illegal drugs. Some items may interact with your medicine. What should I watch for while using this medication? Your condition will be monitored carefully while you are receiving this medication. You may need blood work done while you are taking this medication. This medication may cause serious skin reactions. They can happen weeks to months after starting the medication. Contact your care team right away if you notice fevers or flu-like symptoms with a rash. The rash may be red or purple and then turn into blisters or peeling of the skin. You may also notice a red rash with swelling of the face, lips, or lymph nodes in your neck or under your arms. Talk to your care team if you may be pregnant. Serious birth defects can occur if you take this medication during pregnancy and for 5 months after the last dose. You will need a negative pregnancy test before starting this medication. Contraception is recommended while taking this medication and for 5 months after the last dose. Your care team can help you find the option that works for you. Do not breastfeed while taking this medication and for 5 months after the last dose. This medication may cause infertility. Talk to your care team if you are concerned about your fertility. What side effects may I notice from receiving this medication? Side effects that you should report to your doctor or health care professional as soon as possible: Allergic reactions--skin rash, itching, hives, swelling of the face, lips, tongue, or throat Dry cough, shortness of breath or trouble breathing Eye pain, redness, irritation, or discharge with blurry or decreased vision Heart muscle inflammation--unusual weakness or fatigue, shortness of breath, chest pain, fast or irregular heartbeat, dizziness, swelling of the  ankles, feet, or hands Hormone gland problems--headache, sensitivity to light, unusual weakness or fatigue, dizziness, fast or irregular heartbeat, increased sensitivity to cold or heat, excessive sweating, constipation, hair loss, increased thirst or amount of urine, tremors or shaking, irritability Infusion reactions--chest pain, shortness of breath or trouble breathing, feeling faint or lightheaded Kidney injury (glomerulonephritis)--decrease in the amount of urine, red or dark brown urine, foamy or bubbly urine, swelling of the ankles, hands, or feet Liver injury--right upper belly pain, loss of appetite, nausea, light-colored stool, dark yellow or brown urine, yellowing skin or eyes, unusual weakness or fatigue Pain, tingling, or numbness in the hands or feet, muscle weakness, change in vision, confusion or trouble speaking, loss of balance or coordination, trouble walking, seizures Rash, fever, and swollen lymph nodes Redness, blistering, peeling, or loosening of the skin, including inside the mouth Sudden or severe stomach pain, bloody diarrhea, fever, nausea, vomiting Side  effects that usually do not require medical attention (report to your doctor or health care professional if they continue or are bothersome): Bone, joint, or muscle pain Diarrhea Fatigue Loss of appetite Nausea Skin rash This list may not describe all possible side effects. Call your doctor for medical advice about side effects. You may report side effects to FDA at 1-800-FDA-1088. Where should I keep my medication? This medication is given in a hospital or clinic. It will not be stored at home. NOTE: This sheet is a summary. It may not cover all possible information. If you have questions about this medicine, talk to your doctor, pharmacist, or health care provider.  2024 Elsevier/Gold Standard (2023-09-01 00:00:00)

## 2024-01-26 NOTE — Progress Notes (Signed)
 After cleaning pt PAC, incision site not closed, area pink. MD evaluated site, Steri strips applied, instructed pt to keep clean and dry. Pt advised she has been keeping a band aid on the site. Discussed with pt importance of not keeping band aid on site as it can harbor bacteria and lead to moist  skin. Pt verbalized understanding. PIV started.

## 2024-01-26 NOTE — Progress Notes (Signed)
 Patient complains of stinging at the peripheral IV site. Positive blood return noted, normal saline drips to gravity without hesitation. Warm pack applied and Normal saline infusing concurrently with Chemotherapy. Patient reports the stinging at the IV sit has resolved.

## 2024-01-26 NOTE — Progress Notes (Signed)
 Called to have PET scan read STAT for today's appointment. Dr Myna Hidalgo reviewed imaging himself, and felt like there was good treatment response.   Patient will proceed with cycle three.   Oncology Nurse Navigator Documentation     01/26/2024   10:00 AM  Oncology Nurse Navigator Flowsheets  Navigator Follow Up Date: 02/16/2024  Navigator Follow Up Reason: Follow-up Appointment;Chemotherapy  Navigator Location [facility]  Navigator Encounter Type Treatment  Patient Visit Type MedOnc  Treatment Phase Active Tx  Barriers/Navigation Needs Coordination of Care  Interventions Psycho-Social Support  Acuity Level 2-Minimal Needs (1-2 Barriers Identified)  Support Groups/Services Friends and Family  Time Spent with Patient 15

## 2024-01-27 ENCOUNTER — Encounter: Payer: Self-pay | Admitting: *Deleted

## 2024-01-27 ENCOUNTER — Inpatient Hospital Stay: Payer: 59

## 2024-01-27 ENCOUNTER — Other Ambulatory Visit: Payer: Self-pay

## 2024-01-27 VITALS — BP 136/76 | HR 81 | Temp 97.7°F | Resp 18

## 2024-01-27 DIAGNOSIS — C3411 Malignant neoplasm of upper lobe, right bronchus or lung: Secondary | ICD-10-CM

## 2024-01-27 DIAGNOSIS — Z5112 Encounter for antineoplastic immunotherapy: Secondary | ICD-10-CM | POA: Diagnosis not present

## 2024-01-27 MED ORDER — SODIUM CHLORIDE 0.9 % IV SOLN: INTRAVENOUS | Status: DC

## 2024-01-27 MED ORDER — DEXAMETHASONE SODIUM PHOSPHATE 10 MG/ML IJ SOLN
10.0000 mg | Freq: Once | INTRAMUSCULAR | Status: AC
  Administered 2024-01-27: 10 mg via INTRAVENOUS
  Filled 2024-01-27: qty 1

## 2024-01-27 MED ORDER — ETOPOSIDE CHEMO INJECTION 1 GM/50ML
80.0000 mg/m2 | Freq: Once | INTRAVENOUS | Status: AC
  Administered 2024-01-27: 144 mg via INTRAVENOUS
  Filled 2024-01-27: qty 7.2

## 2024-01-27 MED ORDER — LORAZEPAM 1 MG PO TABS
0.5000 mg | ORAL_TABLET | Freq: Once | ORAL | Status: AC
  Administered 2024-01-27: 0.5 mg via ORAL
  Filled 2024-01-27: qty 1

## 2024-01-27 NOTE — Progress Notes (Signed)
 Reviewed PET which shows good treatment response.   Oncology Nurse Navigator Documentation     01/27/2024    1:15 PM  Oncology Nurse Navigator Flowsheets  Navigator Follow Up Date: 02/16/2024  Navigator Follow Up Reason: Follow-up Appointment  Navigator Location [facility]  Navigator Encounter Type Scan Review  Patient Visit Type MedOnc  Treatment Phase Active Tx  Barriers/Navigation Needs Coordination of Care  Interventions None Required  Acuity Level 2-Minimal Needs (1-2 Barriers Identified)  Support Groups/Services Friends and Family  Time Spent with Patient 15

## 2024-01-27 NOTE — Patient Instructions (Addendum)
Etoposide Injection What is this medication? ETOPOSIDE (e toe POE side) treats some types of cancer. It works by slowing down the growth of cancer cells. This medicine may be used for other purposes; ask your health care provider or pharmacist if you have questions. COMMON BRAND NAME(S): Etopophos, Toposar, VePesid What should I tell my care team before I take this medication? They need to know if you have any of these conditions: Infection Kidney disease Liver disease Low blood counts, such as low white cell, platelet, red cell counts An unusual or allergic reaction to etoposide, other medications, foods, dyes, or preservatives If you or your partner are pregnant or trying to get pregnant Breastfeeding How should I use this medication? This medication is injected into a vein. It is given by your care team in a hospital or clinic setting. Talk to your care team about the use of this medication in children. Special care may be needed. Overdosage: If you think you have taken too much of this medicine contact a poison control center or emergency room at once. NOTE: This medicine is only for you. Do not share this medicine with others. What if I miss a dose? Keep appointments for follow-up doses. It is important not to miss your dose. Call your care team if you are unable to keep an appointment. What may interact with this medication? Warfarin This list may not describe all possible interactions. Give your health care provider a list of all the medicines, herbs, non-prescription drugs, or dietary supplements you use. Also tell them if you smoke, drink alcohol, or use illegal drugs. Some items may interact with your medicine. What should I watch for while using this medication? Your condition will be monitored carefully while you are receiving this medication. This medication may make you feel generally unwell. This is not uncommon as chemotherapy can affect healthy cells as well as cancer  cells. Report any side effects. Continue your course of treatment even though you feel ill unless your care team tells you to stop. This medication can cause serious side effects. To reduce the risk, your care team may give you other medications to take before receiving this one. Be sure to follow the directions from your care team. This medication may increase your risk of getting an infection. Call your care team for advice if you get a fever, chills, sore throat, or other symptoms of a cold or flu. Do not treat yourself. Try to avoid being around people who are sick. This medication may increase your risk to bruise or bleed. Call your care team if you notice any unusual bleeding. Talk to your care team about your risk of cancer. You may be more at risk for certain types of cancers if you take this medication. Talk to your care team if you may be pregnant. Serious birth defects can occur if you take this medication during pregnancy and for 6 months after the last dose. You will need a negative pregnancy test before starting this medication. Contraception is recommended while taking this medication and for 6 months after the last dose. Your care team can help you find the option that works for you. If your partner can get pregnant, use a condom during sex while taking this medication and for 4 months after the last dose. Do not breastfeed while taking this medication. This medication may cause infertility. Talk to your care team if you are concerned about your fertility. What side effects may I notice from receiving this medication?  Side effects that you should report to your care team as soon as possible: Allergic reactions--skin rash, itching, hives, swelling of the face, lips, tongue, or throat Infection--fever, chills, cough, sore throat, wounds that don't heal, pain or trouble when passing urine, general feeling of discomfort or being unwell Low red blood cell level--unusual weakness or fatigue,  dizziness, headache, trouble breathing Unusual bruising or bleeding Side effects that usually do not require medical attention (report to your care team if they continue or are bothersome): Diarrhea Fatigue Hair loss Loss of appetite Nausea Vomiting This list may not describe all possible side effects. Call your doctor for medical advice about side effects. You may report side effects to FDA at 1-800-FDA-1088. Where should I keep my medication? This medication is given in a hospital or clinic. It will not be stored at home. NOTE: This sheet is a summary. It may not cover all possible information. If you have questions about this medicine, talk to your doctor, pharmacist, or health care provider.  2024 Elsevier/Gold Standard (2022-04-09 00:00:00)

## 2024-01-28 ENCOUNTER — Inpatient Hospital Stay: Payer: 59

## 2024-01-28 VITALS — BP 160/71 | HR 55 | Temp 98.1°F | Resp 18

## 2024-01-28 DIAGNOSIS — Z5112 Encounter for antineoplastic immunotherapy: Secondary | ICD-10-CM | POA: Diagnosis not present

## 2024-01-28 DIAGNOSIS — C3411 Malignant neoplasm of upper lobe, right bronchus or lung: Secondary | ICD-10-CM

## 2024-01-28 MED ORDER — SODIUM CHLORIDE 0.9 % IV SOLN
INTRAVENOUS | Status: DC
Start: 2024-01-28 — End: 2024-01-28

## 2024-01-28 MED ORDER — SODIUM CHLORIDE 0.9 % IV SOLN
80.0000 mg/m2 | Freq: Once | INTRAVENOUS | Status: AC
  Administered 2024-01-28: 144 mg via INTRAVENOUS
  Filled 2024-01-28: qty 7.2

## 2024-01-28 MED ORDER — DEXAMETHASONE SODIUM PHOSPHATE 10 MG/ML IJ SOLN
10.0000 mg | Freq: Once | INTRAMUSCULAR | Status: AC
Start: 2024-01-28 — End: 2024-01-28
  Administered 2024-01-28: 10 mg via INTRAVENOUS
  Filled 2024-01-28: qty 1

## 2024-01-28 NOTE — Patient Instructions (Signed)
 [facility] [facility] [facility]  Discharge Instructions: Thank you for choosing [facility] to provide your oncology and hematology care.   If you have a lab appointment with the Cancer Center, please go directly to the Cancer Center and check in at the registration area.  Wear comfortable clothing and clothing appropriate for easy access to any Portacath or PICC line.   We strive to give you quality time with your provider. You may need to reschedule your appointment if you arrive late (15 or more minutes).  Arriving late affects you and other patients whose appointments are after yours.  Also, if you miss three or more appointments without notifying the office, you may be dismissed from the clinic at the provider's discretion.      For prescription refill requests, have your pharmacy contact our office and allow 72 hours for refills to be completed.    Today you received the following chemotherapy and/or immunotherapy agents Etoposide       To help prevent nausea and vomiting after your treatment, we encourage you to take your nausea medication as directed.  BELOW ARE SYMPTOMS THAT SHOULD BE REPORTED IMMEDIATELY: *FEVER GREATER THAN 100.4 F (38 C) OR HIGHER *CHILLS OR SWEATING *NAUSEA AND VOMITING THAT IS NOT CONTROLLED WITH YOUR NAUSEA MEDICATION *UNUSUAL SHORTNESS OF BREATH *UNUSUAL BRUISING OR BLEEDING *URINARY PROBLEMS (pain or burning when urinating, or frequent urination) *BOWEL PROBLEMS (unusual diarrhea, constipation, pain near the anus) TENDERNESS IN MOUTH AND THROAT WITH OR WITHOUT PRESENCE OF ULCERS (sore throat, sores in mouth, or a toothache) UNUSUAL RASH, SWELLING OR PAIN  UNUSUAL VAGINAL DISCHARGE OR ITCHING   Items with * indicate a potential emergency and should be followed up as soon as possible or go to the Emergency Department if any problems should occur.  Please show the CHEMOTHERAPY ALERT CARD or IMMUNOTHERAPY  ALERT CARD at check-in to the Emergency Department and triage nurse. Should you have questions after your visit or need to cancel or reschedule your appointment, please contact [facility] [place] - A DEPT OF [facility]  [phone] and follow the prompts.  Office hours are 8:00 a.m. to 4:30 p.m. Monday - Friday. Please note that voicemails left after 4:00 p.m. may not be returned until the following business day.  We are closed weekends and major holidays. You have access to a nurse at all times for urgent questions. Please call the main number to the clinic [phone] and follow the prompts.  For any non-urgent questions, you may also contact your provider using MyChart. We now offer e-Visits for anyone 13 and older to request care online for non-urgent symptoms. For details visit [email or web address].   Also download the MyChart app! Go to the app store, search "MyChart", open the app, select [place], and log in with your MyChart username and password.

## 2024-01-31 ENCOUNTER — Inpatient Hospital Stay: Attending: Hematology & Oncology

## 2024-01-31 ENCOUNTER — Ambulatory Visit: Payer: 59

## 2024-01-31 DIAGNOSIS — C7951 Secondary malignant neoplasm of bone: Secondary | ICD-10-CM | POA: Insufficient documentation

## 2024-01-31 DIAGNOSIS — Z5111 Encounter for antineoplastic chemotherapy: Secondary | ICD-10-CM | POA: Insufficient documentation

## 2024-01-31 DIAGNOSIS — Z79899 Other long term (current) drug therapy: Secondary | ICD-10-CM | POA: Insufficient documentation

## 2024-01-31 DIAGNOSIS — Z5112 Encounter for antineoplastic immunotherapy: Secondary | ICD-10-CM | POA: Insufficient documentation

## 2024-01-31 DIAGNOSIS — C3490 Malignant neoplasm of unspecified part of unspecified bronchus or lung: Secondary | ICD-10-CM | POA: Insufficient documentation

## 2024-01-31 NOTE — Progress Notes (Signed)
 Patient presented in the waiting room for new steri strips for her port incision. Area cleaned, new steri strips applies. Only less than 1/4 inch area deshissed at distal incision line. New strips applied. Patient states she is still taking antibiotics. States she has been taking a shower and had difficulty covering the  steri strips. Instructed her to take tub baths only and keep this area dry. Patient verbalizes understanding.

## 2024-02-04 ENCOUNTER — Inpatient Hospital Stay

## 2024-02-04 NOTE — Progress Notes (Signed)
 [facility] Clinical Social Work  Initial Assessment   CHI WOODHAM is a 58 y.o. year old female contacted by phone. Clinical Social Work was referred by self for assessment of psychosocial needs.   SDOH (Social Determinants of Health) assessments performed: Yes SDOH Interventions    Flowsheet Row Clinical Support from 02/04/2024 in [facility] [place] [facility] - A Dept Of [place]. [facility] Clinical Support from 12/29/2023 in [facility] [place] - A Dept Of [place]. [facility] Office Visit from 12/17/2023 in [facility] [place] - A Dept Of [place]. [facility] ED to Hosp-Admission (Discharged) from 11/25/2023 in [place] [facility]  SDOH Interventions      Food Insecurity Interventions -- -- Patient Declined Inpatient TOC, Community Resources Provided  Housing Interventions -- -- -- Intervention Not Indicated, Inpatient TOC  Transportation Interventions -- -- -- Intervention Not Indicated, Inpatient TOC  Utilities Interventions -- -- -- Programmer, applications Provided, Inpatient [employer]  Financial Strain Interventions [employer] Resources Provided -- -- --  Health Literacy Interventions -- Intervention Not Indicated -- --       SDOH Screenings   Food Insecurity: Patient Declined (12/17/2023)  Recent Concern: Food Insecurity - Food Insecurity Present (12/03/2023)  Housing: Low Risk  (12/17/2023)  Transportation Needs: No Transportation Needs (12/17/2023)  Utilities: Not At Risk (12/17/2023)  Recent Concern: Utilities - At Risk (11/27/2023)  Financial Resource Strain: Medium Risk (02/04/2024)  Social Connections: Socially Integrated (12/03/2023)  Tobacco Use: High Risk (01/26/2024)  Health Literacy: Adequate Health Literacy (12/29/2023)     Distress Screen completed: No     No data to display            Family/Social Information:  Housing Arrangement: patient lives with her husband. Family members/support persons in your life?  Family Transportation concerns: no  Employment: Disabled.  Patient works at [employer].  Income source: Short-Term Disability Financial concerns: Yes, due to illness and/or loss of work during treatment Type of concern: Medical bills Food access concerns: no Religious or spiritual practice: No Advanced directives: No Services Currently in place:  [facility]  Coping/ Adjustment to diagnosis: Patient understands treatment plan and what happens next? yes Concerns about diagnosis and/or treatment: Losing my job and/or losing income Patient reported stressors: Therapist, art and/or priorities: Family Patient enjoys time with family/ friends Current coping skills/ strengths: Average or above average intelligence , Capable of independent living , Manufacturing systems engineer , General fund of knowledge , and Supportive family/friends     SUMMARY: Current SDOH Barriers:  Financial constraints related to loss of income due to illness.  Clinical Social Work Clinical Goal(s):  Explore community resource options for unmet needs related to:  Financial Strain   Interventions: Discussed common feeling and emotions when being diagnosed with cancer, and the importance of support during treatment Informed patient of the support team roles and support services at [facility] Provided CSW contact information and encouraged patient to call with any questions or concerns Provided patient with information about CSW role.  She would like to apply for Social Security Disability.  CSW sent referral tot he [facility].  Patient is not eligible for the [employer].  Mailed her information on lung cancer grants and certificates for free massages.  She also had questions regarding long term disability.  Sent her information flyer on [email or web address].  Faxed referral form to Cancer Services.   Follow Up Plan: Patient will contact CSW with any support or  resource needs Patient verbalizes understanding of plan: Yes    Pascal Lux Babbie Dondlinger,  LCSW Clinical Social Worker [facility]

## 2024-02-09 ENCOUNTER — Telehealth: Payer: Self-pay | Admitting: *Deleted

## 2024-02-09 NOTE — Telephone Encounter (Signed)
 Frances[employer] STD papers scanned and sent to [employer]

## 2024-02-14 ENCOUNTER — Other Ambulatory Visit: Payer: Self-pay

## 2024-02-14 NOTE — Progress Notes (Unsigned)
 Udenyca day 5 added to careplan per Dr, Gustavo Lah instructions.

## 2024-02-16 ENCOUNTER — Inpatient Hospital Stay: Payer: 59

## 2024-02-16 ENCOUNTER — Inpatient Hospital Stay (HOSPITAL_BASED_OUTPATIENT_CLINIC_OR_DEPARTMENT_OTHER): Payer: 59 | Admitting: Hematology & Oncology

## 2024-02-16 ENCOUNTER — Ambulatory Visit: Payer: 59

## 2024-02-16 ENCOUNTER — Other Ambulatory Visit: Payer: 59

## 2024-02-16 ENCOUNTER — Ambulatory Visit: Payer: 59 | Admitting: Hematology & Oncology

## 2024-02-16 ENCOUNTER — Encounter: Payer: Self-pay | Admitting: Hematology & Oncology

## 2024-02-16 ENCOUNTER — Encounter: Payer: Self-pay | Admitting: *Deleted

## 2024-02-16 VITALS — BP 142/71 | HR 79 | Temp 98.6°F | Resp 18 | Ht 66.0 in | Wt 148.2 lb

## 2024-02-16 DIAGNOSIS — C7951 Secondary malignant neoplasm of bone: Secondary | ICD-10-CM | POA: Diagnosis not present

## 2024-02-16 DIAGNOSIS — Z5112 Encounter for antineoplastic immunotherapy: Secondary | ICD-10-CM | POA: Diagnosis present

## 2024-02-16 DIAGNOSIS — Z5111 Encounter for antineoplastic chemotherapy: Secondary | ICD-10-CM | POA: Diagnosis present

## 2024-02-16 DIAGNOSIS — Z79899 Other long term (current) drug therapy: Secondary | ICD-10-CM | POA: Diagnosis not present

## 2024-02-16 DIAGNOSIS — C3411 Malignant neoplasm of upper lobe, right bronchus or lung: Secondary | ICD-10-CM

## 2024-02-16 DIAGNOSIS — C3490 Malignant neoplasm of unspecified part of unspecified bronchus or lung: Secondary | ICD-10-CM | POA: Diagnosis present

## 2024-02-16 LAB — LACTATE DEHYDROGENASE: LDH: 147 U/L (ref 98–192)

## 2024-02-16 LAB — CBC WITH DIFFERENTIAL (CANCER CENTER ONLY)
Abs Immature Granulocytes: 0.02 10*3/uL (ref 0.00–0.07)
Basophils Absolute: 0 10*3/uL (ref 0.0–0.1)
Basophils Relative: 1 %
Eosinophils Absolute: 0.1 10*3/uL (ref 0.0–0.5)
Eosinophils Relative: 3 %
HCT: 28.1 % — ABNORMAL LOW (ref 36.0–46.0)
Hemoglobin: 8.9 g/dL — ABNORMAL LOW (ref 12.0–15.0)
Immature Granulocytes: 1 %
Lymphocytes Relative: 36 %
Lymphs Abs: 0.5 10*3/uL — ABNORMAL LOW (ref 0.7–4.0)
MCH: 29.4 pg (ref 26.0–34.0)
MCHC: 31.7 g/dL (ref 30.0–36.0)
MCV: 92.7 fL (ref 80.0–100.0)
Monocytes Absolute: 0.2 10*3/uL (ref 0.1–1.0)
Monocytes Relative: 13 %
Neutro Abs: 0.7 10*3/uL — ABNORMAL LOW (ref 1.7–7.7)
Neutrophils Relative %: 46 %
Platelet Count: 172 10*3/uL (ref 150–400)
RBC: 3.03 MIL/uL — ABNORMAL LOW (ref 3.87–5.11)
RDW: 20.4 % — ABNORMAL HIGH (ref 11.5–15.5)
Smear Review: ADEQUATE
WBC Count: 1.5 10*3/uL — ABNORMAL LOW (ref 4.0–10.5)
WBC Morphology: ABNORMAL
nRBC: 0 % (ref 0.0–0.2)

## 2024-02-16 LAB — CMP (CANCER CENTER ONLY)
ALT: <5 U/L (ref 0–44)
AST: 8 U/L — ABNORMAL LOW (ref 15–41)
Albumin: 3.4 g/dL — ABNORMAL LOW (ref 3.5–5.0)
Alkaline Phosphatase: 73 U/L (ref 38–126)
Anion gap: 5 (ref 5–15)
BUN: 7 mg/dL (ref 6–20)
CO2: 28 mmol/L (ref 22–32)
Calcium: 8.3 mg/dL — ABNORMAL LOW (ref 8.9–10.3)
Chloride: 108 mmol/L (ref 98–111)
Creatinine: 0.65 mg/dL (ref 0.44–1.00)
GFR, Estimated: >60 mL/min (ref >60)
Glucose, Bld: 103 mg/dL — ABNORMAL HIGH (ref 70–99)
Potassium: 3.9 mmol/L (ref 3.5–5.1)
Sodium: 141 mmol/L (ref 135–145)
Total Bilirubin: 0.3 mg/dL (ref 0.0–1.2)
Total Protein: 6.8 g/dL (ref 6.5–8.1)

## 2024-02-16 NOTE — Patient Instructions (Signed)

## 2024-02-16 NOTE — Progress Notes (Signed)
 Hematology and Oncology Follow Up Visit  Tammy Cordova [id number] 23-Jul-1966 58 y.o. 02/16/2024   Principle Diagnosis:  Extensive stage small cell lung cancer --TMB=23  Current Therapy:   Status post cycle 1 of carboplatinum/etoposide + XRT s/p cycle #3 -- start on 12/04/2023     Interim History:  Tammy Cordova is back for follow-up.  Overall, she is doing quite well.  Her PET scan report did come back that she had a very nice response.  There is almost complete response in the mediastinum.  With the bone metastasis, there was improvement.  She still has shortness of breath.  She has some underlying COPD.  She has had no problems with nausea or vomiting..  She did do better not having the Neulasta.  She has had no bleeding.  There is been no change in bowel or bladder habits.  She has had no rashes.  There is been no leg swelling.  Overall, I would say that her performance status is probably ECOG 1.    Medications:  Current Outpatient Medications:    dexamethasone (DECADRON) 4 MG tablet, Take 2 tablets (8 mg total) by mouth daily. Take 2 tablets by mouth starting the day after last dose of etoposide for one day only.  Repeat every 21 days, Disp: 60 tablet, Rfl: 2   dronabinol ([name]) 2.5 MG capsule, Take 1 capsule (2.5 mg total) by mouth 2 (two) times daily before lunch and supper., Disp: 60 capsule, Rfl: 0   folic acid (FOLVITE) 1 MG tablet, Take 1 tablet (1 mg total) by mouth daily., Disp: 30 tablet, Rfl: 3   levofloxacin (LEVAQUIN) 500 MG tablet, Take 1 tablet (500 mg total) by mouth daily. Start taking Levaquin daily starting on 01/01/2024, Disp: 15 tablet, Rfl: 6   ondansetron (ZOFRAN) 8 MG tablet, Take 1 tablet (8 mg total) by mouth every 8 (eight) hours as needed for nausea or vomiting. Start on third day after Carboplatin, Disp: 30 tablet, Rfl: 1   pantoprazole (PROTONIX) 40 MG tablet, Take 40 mg by mouth every morning., Disp: , Rfl:    prochlorperazine (COMPAZINE) 10 MG tablet,  Take 1 tablet (10 mg total) by mouth every 6 (six) hours as needed for nausea or vomiting., Disp: 30 tablet, Rfl: 1   Vitamin D, Ergocalciferol, (DRISDOL) 1.25 MG (50000 UNIT) CAPS capsule, Take 1 capsule (50,000 Units total) by mouth every 7 (seven) days., Disp: 4 capsule, Rfl: 2   albuterol (VENTOLIN HFA) 108 (90 Base) MCG/ACT inhaler, Inhale 2 puffs into the lungs every 6 (six) hours as needed for wheezing or shortness of breath. (Patient not taking: Reported on 12/30/2023), Disp: , Rfl:    HYDROcodone-acetaminophen (NORCO/VICODIN) 5-325 MG tablet, Take 1 tablet by mouth every 6 (six) hours as needed for moderate pain (pain score 4-6). (Patient not taking: Reported on 02/16/2024), Disp: 20 tablet, Rfl: 0   lidocaine (XYLOCAINE) 2 % solution, Use as directed 15 mLs in the mouth or throat every 6 (six) hours as needed for mouth pain. Please take 15 cc (1 tablespoon) half hour before you try to eat. (Patient not taking: Reported on 02/16/2024), Disp: 300 mL, Rfl: 4   sucralfate (CARAFATE) 1 GM/10ML suspension, Take 10 mLs (1 g total) by mouth 4 (four) times daily -  with meals and at bedtime., Disp: 420 mL, Rfl: 0  Allergies: No Known Allergies  Past Medical History, Surgical history, Social history, and Family History were reviewed and updated.  Review of Systems: Review of Systems  Constitutional:  Positive for appetite change and unexpected weight change.  HENT:   Positive for sore throat.   Eyes: Negative.   Respiratory: Negative.    Cardiovascular: Negative.   Gastrointestinal:  Positive for nausea.  Endocrine: Negative.   Genitourinary: Negative.    Musculoskeletal: Negative.   Skin: Negative.   Neurological:  Positive for dizziness.  Hematological: Negative.   Psychiatric/Behavioral: Negative.      Physical Exam: Vital signs show temperature of 98.6.  Pulse 79.  Blood pressure 142/71.  Weight is 148 pounds.    Wt Readings from Last 3 Encounters:  02/16/24 148 lb 4 oz (67.2 kg)   01/26/24 135 lb (61.2 kg)  12/29/23 135 lb 1.9 oz (61.3 kg)    Physical Exam Vitals reviewed.  HENT:     Head: Normocephalic and atraumatic.  Eyes:     Pupils: Pupils are equal, round, and reactive to light.  Cardiovascular:     Rate and Rhythm: Normal rate and regular rhythm.     Heart sounds: Normal heart sounds.  Pulmonary:     Effort: Pulmonary effort is normal.     Breath sounds: Normal breath sounds.  Abdominal:     General: Bowel sounds are normal.     Palpations: Abdomen is soft.  Musculoskeletal:        General: No tenderness or deformity. Normal range of motion.     Cervical back: Normal range of motion.  Lymphadenopathy:     Cervical: No cervical adenopathy.  Skin:    General: Skin is warm and dry.     Findings: No erythema or rash.  Neurological:     Mental Status: She is alert and oriented to person, place, and time.  Psychiatric:        Behavior: Behavior normal.        Thought Content: Thought content normal.        Judgment: Judgment normal.      Lab Results  Component Value Date   WBC 3.6 (L) 01/26/2024   HGB 10.5 (L) 01/26/2024   HCT 33.0 (L) 01/26/2024   MCV 88.7 01/26/2024   PLT 261 01/26/2024     Chemistry      Component Value Date/Time   NA 139 01/26/2024 0840   K 3.5 01/26/2024 0840   CL 105 01/26/2024 0840   CO2 28 01/26/2024 0840   BUN 5 (L) 01/26/2024 0840   CREATININE 0.53 01/26/2024 0840      Component Value Date/Time   CALCIUM 9.1 01/26/2024 0840   ALKPHOS 67 01/26/2024 0840   AST 9 (L) 01/26/2024 0840   ALT <5 01/26/2024 0840   BILITOT 0.4 01/26/2024 0840       Impression and Plan: Tammy Cordova is a very charming 58 year old Afro-American female with extensive stage small cell lung cancer.  Because of the bulk of her mediastinal, we went ahead and gave radiation with chemotherapy.  Again, she had a very nice response by the PET scan.  Unfortunately, her white cell count is just too low to be treated today.  We will  have to put her off for 1 week.  I will plan to see her back in about 4 weeks myself.  At that time, we should be able to move ahead just  with immunotherapy.     Tammy Macho, MD 3/19/20258:13 AM

## 2024-02-16 NOTE — Progress Notes (Unsigned)
 Patient's counts too low for treatment. Due to tolerance, Neulasta was omitted from her last cycle. She will return next week for reconsideration of treatment.   Oncology Nurse Navigator Documentation     02/16/2024   11:30 AM  Oncology Nurse Navigator Flowsheets  Navigator Follow Up Date: 03/15/2024  Navigator Follow Up Reason: Follow-up Appointment;Chemotherapy  Navigator Location [facility]  Navigator Encounter Type Appt/Treatment Plan Review  Patient Visit Type MedOnc  Treatment Phase Active Tx  Barriers/Navigation Needs Coordination of Care  Interventions None Required  Acuity Level 2-Minimal Needs (1-2 Barriers Identified)  Support Groups/Services Friends and Family  Time Spent with Patient 15

## 2024-02-17 ENCOUNTER — Other Ambulatory Visit: Payer: Self-pay

## 2024-02-17 ENCOUNTER — Encounter (HOSPITAL_COMMUNITY): Payer: Self-pay | Admitting: Hematology & Oncology

## 2024-02-17 ENCOUNTER — Inpatient Hospital Stay: Payer: 59

## 2024-02-18 ENCOUNTER — Other Ambulatory Visit: Payer: Self-pay

## 2024-02-18 ENCOUNTER — Telehealth: Payer: Self-pay

## 2024-02-18 ENCOUNTER — Ambulatory Visit: Payer: 59

## 2024-02-18 NOTE — Telephone Encounter (Signed)
 Notified the pt about her complete Cancer Policy Form and it was faxed with confirmation received. Pt requested her copy to be mailed.

## 2024-02-19 ENCOUNTER — Other Ambulatory Visit: Payer: Self-pay

## 2024-02-20 ENCOUNTER — Other Ambulatory Visit: Payer: Self-pay

## 2024-02-21 ENCOUNTER — Ambulatory Visit: Payer: 59

## 2024-02-23 ENCOUNTER — Inpatient Hospital Stay

## 2024-02-23 VITALS — BP 155/73 | HR 77 | Temp 98.9°F | Resp 18 | Ht 66.0 in | Wt 143.1 lb

## 2024-02-23 DIAGNOSIS — C3411 Malignant neoplasm of upper lobe, right bronchus or lung: Secondary | ICD-10-CM

## 2024-02-23 DIAGNOSIS — Z5112 Encounter for antineoplastic immunotherapy: Secondary | ICD-10-CM | POA: Diagnosis not present

## 2024-02-23 LAB — CBC WITH DIFFERENTIAL (CANCER CENTER ONLY)
Abs Immature Granulocytes: 0.01 10*3/uL (ref 0.00–0.07)
Basophils Absolute: 0 10*3/uL (ref 0.0–0.1)
Basophils Relative: 0 %
Eosinophils Absolute: 0 10*3/uL (ref 0.0–0.5)
Eosinophils Relative: 1 %
HCT: 31.1 % — ABNORMAL LOW (ref 36.0–46.0)
Hemoglobin: 9.7 g/dL — ABNORMAL LOW (ref 12.0–15.0)
Immature Granulocytes: 0 %
Lymphocytes Relative: 23 %
Lymphs Abs: 0.8 10*3/uL (ref 0.7–4.0)
MCH: 29 pg (ref 26.0–34.0)
MCHC: 31.2 g/dL (ref 30.0–36.0)
MCV: 93.1 fL (ref 80.0–100.0)
Monocytes Absolute: 0.3 10*3/uL (ref 0.1–1.0)
Monocytes Relative: 8 %
Neutro Abs: 2.2 10*3/uL (ref 1.7–7.7)
Neutrophils Relative %: 68 %
Platelet Count: 252 10*3/uL (ref 150–400)
RBC: 3.34 MIL/uL — ABNORMAL LOW (ref 3.87–5.11)
RDW: 20.3 % — ABNORMAL HIGH (ref 11.5–15.5)
WBC Count: 3.3 10*3/uL — ABNORMAL LOW (ref 4.0–10.5)
nRBC: 0 % (ref 0.0–0.2)

## 2024-02-23 LAB — CMP (CANCER CENTER ONLY)
ALT: <5 U/L (ref 0–44)
AST: 9 U/L — ABNORMAL LOW (ref 15–41)
Albumin: 3.6 g/dL (ref 3.5–5.0)
Alkaline Phosphatase: 89 U/L (ref 38–126)
Anion gap: 6 (ref 5–15)
BUN: 6 mg/dL (ref 6–20)
CO2: 27 mmol/L (ref 22–32)
Calcium: 8.7 mg/dL — ABNORMAL LOW (ref 8.9–10.3)
Chloride: 107 mmol/L (ref 98–111)
Creatinine: 0.55 mg/dL (ref 0.44–1.00)
GFR, Estimated: >60 mL/min (ref >60)
Glucose, Bld: 102 mg/dL — ABNORMAL HIGH (ref 70–99)
Potassium: 3.7 mmol/L (ref 3.5–5.1)
Sodium: 140 mmol/L (ref 135–145)
Total Bilirubin: 0.6 mg/dL (ref 0.0–1.2)
Total Protein: 7.2 g/dL (ref 6.5–8.1)

## 2024-02-23 LAB — LACTATE DEHYDROGENASE: LDH: 169 U/L (ref 98–192)

## 2024-02-23 MED ORDER — SODIUM CHLORIDE 0.9 % IV SOLN
448.4000 mg | Freq: Once | INTRAVENOUS | Status: AC
  Administered 2024-02-23: 450 mg via INTRAVENOUS
  Filled 2024-02-23: qty 45

## 2024-02-23 MED ORDER — HEPARIN SOD (PORK) LOCK FLUSH 100 UNIT/ML IV SOLN
500.0000 [IU] | Freq: Once | INTRAVENOUS | Status: AC | PRN
  Administered 2024-02-23: 500 [IU]

## 2024-02-23 MED ORDER — SODIUM CHLORIDE 0.9 % IV SOLN
150.0000 mg | Freq: Once | INTRAVENOUS | Status: AC
  Administered 2024-02-23: 150 mg via INTRAVENOUS
  Filled 2024-02-23: qty 150

## 2024-02-23 MED ORDER — SODIUM CHLORIDE 0.9 % IV SOLN
80.0000 mg/m2 | Freq: Once | INTRAVENOUS | Status: AC
  Administered 2024-02-23: 144 mg via INTRAVENOUS
  Filled 2024-02-23: qty 7.2

## 2024-02-23 MED ORDER — PALONOSETRON HCL INJECTION 0.25 MG/5ML
0.2500 mg | Freq: Once | INTRAVENOUS | Status: AC
  Administered 2024-02-23: 0.25 mg via INTRAVENOUS
  Filled 2024-02-23: qty 5

## 2024-02-23 MED ORDER — SODIUM CHLORIDE 0.9 % IV SOLN
INTRAVENOUS | Status: DC
Start: 2024-02-23 — End: 2024-02-23

## 2024-02-23 MED ORDER — SODIUM CHLORIDE 0.9% FLUSH
10.0000 mL | INTRAVENOUS | Status: DC | PRN
  Administered 2024-02-23: 10 mL

## 2024-02-23 MED ORDER — DEXAMETHASONE SODIUM PHOSPHATE 10 MG/ML IJ SOLN
10.0000 mg | Freq: Once | INTRAMUSCULAR | Status: AC
  Administered 2024-02-23: 10 mg via INTRAVENOUS
  Filled 2024-02-23: qty 1

## 2024-02-23 MED ORDER — SODIUM CHLORIDE 0.9 % IV SOLN
1200.0000 mg | Freq: Once | INTRAVENOUS | Status: AC
  Administered 2024-02-23: 1200 mg via INTRAVENOUS
  Filled 2024-02-23: qty 20

## 2024-02-23 NOTE — Patient Instructions (Signed)
 [facility] [facility] [facility]  Discharge Instructions: Thank you for choosing [facility] to provide your oncology and hematology care.   If you have a lab appointment with the Cancer Center, please go directly to the Cancer Center and check in at the registration area.  Wear comfortable clothing and clothing appropriate for easy access to any Portacath or PICC line.   We strive to give you quality time with your provider. You may need to reschedule your appointment if you arrive late (15 or more minutes).  Arriving late affects you and other patients whose appointments are after yours.  Also, if you miss three or more appointments without notifying the office, you may be dismissed from the clinic at the provider's discretion.      For prescription refill requests, have your pharmacy contact our office and allow 72 hours for refills to be completed.    Today you received the following chemotherapy and/or immunotherapy agents VP16, Carbo, Tecentriq      To help prevent nausea and vomiting after your treatment, we encourage you to take your nausea medication as directed.  BELOW ARE SYMPTOMS THAT SHOULD BE REPORTED IMMEDIATELY: *FEVER GREATER THAN 100.4 F (38 C) OR HIGHER *CHILLS OR SWEATING *NAUSEA AND VOMITING THAT IS NOT CONTROLLED WITH YOUR NAUSEA MEDICATION *UNUSUAL SHORTNESS OF BREATH *UNUSUAL BRUISING OR BLEEDING *URINARY PROBLEMS (pain or burning when urinating, or frequent urination) *BOWEL PROBLEMS (unusual diarrhea, constipation, pain near the anus) TENDERNESS IN MOUTH AND THROAT WITH OR WITHOUT PRESENCE OF ULCERS (sore throat, sores in mouth, or a toothache) UNUSUAL RASH, SWELLING OR PAIN  UNUSUAL VAGINAL DISCHARGE OR ITCHING   Items with * indicate a potential emergency and should be followed up as soon as possible or go to the Emergency Department if any problems should occur.  Please show the CHEMOTHERAPY ALERT CARD or  IMMUNOTHERAPY ALERT CARD at check-in to the Emergency Department and triage nurse. Should you have questions after your visit or need to cancel or reschedule your appointment, please contact [facility] [place] - A DEPT OF [facility]  [phone] and follow the prompts.  Office hours are 8:00 a.m. to 4:30 p.m. Monday - Friday. Please note that voicemails left after 4:00 p.m. may not be returned until the following business day.  We are closed weekends and major holidays. You have access to a nurse at all times for urgent questions. Please call the main number to the clinic [phone] and follow the prompts.  For any non-urgent questions, you may also contact your provider using MyChart. We now offer e-Visits for anyone 60 and older to request care online for non-urgent symptoms. For details visit [email or web address].   Also download the MyChart app! Go to the app store, search "MyChart", open the app, select [place], and log in with your MyChart username and password.

## 2024-02-23 NOTE — Patient Instructions (Signed)

## 2024-02-24 ENCOUNTER — Encounter (HOSPITAL_COMMUNITY): Payer: Self-pay | Admitting: Hematology & Oncology

## 2024-02-24 ENCOUNTER — Inpatient Hospital Stay

## 2024-02-24 VITALS — BP 151/74 | HR 73 | Temp 98.9°F

## 2024-02-24 DIAGNOSIS — C3411 Malignant neoplasm of upper lobe, right bronchus or lung: Secondary | ICD-10-CM

## 2024-02-24 DIAGNOSIS — Z5112 Encounter for antineoplastic immunotherapy: Secondary | ICD-10-CM | POA: Diagnosis not present

## 2024-02-24 MED ORDER — DEXAMETHASONE SODIUM PHOSPHATE 10 MG/ML IJ SOLN
10.0000 mg | Freq: Once | INTRAMUSCULAR | Status: AC
  Administered 2024-02-24: 10 mg via INTRAVENOUS
  Filled 2024-02-24: qty 1

## 2024-02-24 MED ORDER — SODIUM CHLORIDE 0.9% FLUSH
10.0000 mL | INTRAVENOUS | Status: DC | PRN
  Administered 2024-02-24: 10 mL

## 2024-02-24 MED ORDER — HEPARIN SOD (PORK) LOCK FLUSH 100 UNIT/ML IV SOLN
500.0000 [IU] | Freq: Once | INTRAVENOUS | Status: AC | PRN
Start: 2024-02-24 — End: 2024-02-24
  Administered 2024-02-24: 500 [IU]

## 2024-02-24 MED ORDER — SODIUM CHLORIDE 0.9 % IV SOLN
80.0000 mg/m2 | Freq: Once | INTRAVENOUS | Status: AC
  Administered 2024-02-24: 144 mg via INTRAVENOUS
  Filled 2024-02-24: qty 7.2

## 2024-02-24 MED ORDER — SODIUM CHLORIDE 0.9 % IV SOLN: INTRAVENOUS | Status: DC

## 2024-02-24 NOTE — Patient Instructions (Addendum)
 [facility] A [facility] OF [facility]  Discharge Instructions: Thank you for choosing [facility] to provide your oncology and hematology care.   If you have a lab appointment with the Cancer Center, please go directly to the Cancer Center and check in at the registration area.  Wear comfortable clothing and clothing appropriate for easy access to any Portacath or PICC line.   We strive to give you quality time with your provider. You may need to reschedule your appointment if you arrive late (15 or more minutes).  Arriving late affects you and other patients whose appointments are after yours.  Also, if you miss three or more appointments without notifying the office, you may be dismissed from the clinic at the provider's discretion.      For prescription refill requests, have your pharmacy contact our office and allow 72 hours for refills to be completed.    Today you received the following chemotherapy and/or immunotherapy agents       To help prevent nausea and vomiting after your treatment, we encourage you to take your nausea medication as directed.  BELOW ARE SYMPTOMS THAT SHOULD BE REPORTED IMMEDIATELY: *FEVER GREATER THAN 100.4 F (38 C) OR HIGHER *CHILLS OR SWEATING *NAUSEA AND VOMITING THAT IS NOT CONTROLLED WITH YOUR NAUSEA MEDICATION *UNUSUAL SHORTNESS OF BREATH *UNUSUAL BRUISING OR BLEEDING *URINARY PROBLEMS (pain or burning when urinating, or frequent urination) *BOWEL PROBLEMS (unusual diarrhea, constipation, pain near the anus) TENDERNESS IN MOUTH AND THROAT WITH OR WITHOUT PRESENCE OF ULCERS (sore throat, sores in mouth, or a toothache) UNUSUAL RASH, SWELLING OR PAIN  UNUSUAL VAGINAL DISCHARGE OR ITCHING   Items with * indicate a potential emergency and should be followed up as soon as possible or go to the Emergency Department if any problems should occur.  Please show the CHEMOTHERAPY ALERT CARD or IMMUNOTHERAPY ALERT CARD  at check-in to the Emergency Department and triage nurse. Should you have questions after your visit or need to cancel or reschedule your appointment, please contact [facility] - A DEPT OF Eligha Bridegroom [facility]  [phone] and follow the prompts.  Office hours are 8:00 a.m. to 4:30 p.m. Monday - Friday. Please note that voicemails left after 4:00 p.m. may not be returned until the following business day.  We are closed weekends and major holidays. You have access to a nurse at all times for urgent questions. Please call the main number to the clinic [phone] and follow the prompts.  For any non-urgent questions, you may also contact your provider using MyChart. We now offer e-Visits for anyone 47 and older to request care online for non-urgent symptoms. For details visit [email or web address].   Also download the MyChart app! Go to the app store, search "MyChart", open the app, select [facility], and log in with your MyChart username and password.  [facility]  Discharge Instructions: Thank you for choosing [facility] to provide your oncology and hematology care.   If you have a lab appointment with the Cancer Center, please go directly to the Cancer Center and check in at the registration area.  Wear comfortable clothing and clothing appropriate for easy access to any Portacath or PICC line.   We strive to give you quality time with your provider. You may need to reschedule your appointment if you arrive late (15 or more  minutes).  Arriving late affects you and other patients whose appointments are after yours.  Also, if you miss three or more appointments without notifying the office, you may be dismissed from the clinic at the provider's discretion.      For prescription refill requests, have your pharmacy contact our office and allow 72 hours for refills to be completed.    Today you received  the following chemotherapy and/or immunotherapy agents VP-16    To help prevent nausea and vomiting after your treatment, we encourage you to take your nausea medication as directed.  BELOW ARE SYMPTOMS THAT SHOULD BE REPORTED IMMEDIATELY: *FEVER GREATER THAN 100.4 F (38 C) OR HIGHER *CHILLS OR SWEATING *NAUSEA AND VOMITING THAT IS NOT CONTROLLED WITH YOUR NAUSEA MEDICATION *UNUSUAL SHORTNESS OF BREATH *UNUSUAL BRUISING OR BLEEDING *URINARY PROBLEMS (pain or burning when urinating, or frequent urination) *BOWEL PROBLEMS (unusual diarrhea, constipation, pain near the anus) TENDERNESS IN MOUTH AND THROAT WITH OR WITHOUT PRESENCE OF ULCERS (sore throat, sores in mouth, or a toothache) UNUSUAL RASH, SWELLING OR PAIN  UNUSUAL VAGINAL DISCHARGE OR ITCHING   Items with * indicate a potential emergency and should be followed up as soon as possible or go to the Emergency Department if any problems should occur.  Please show the CHEMOTHERAPY ALERT CARD or IMMUNOTHERAPY ALERT CARD at check-in to the Emergency Department and triage nurse. Should you have questions after your visit or need to cancel or reschedule your appointment, please contact [facility] - A DEPT OF [facility]  [phone] and follow the prompts.  Office hours are 8:00 a.m. to 4:30 p.m. Monday - Friday. Please note that voicemails left after 4:00 p.m. may not be returned until the following business day.  We are closed weekends and major holidays. You have access to a nurse at all times for urgent questions. Please call the main number to the clinic [phone] and follow the prompts.  For any non-urgent questions, you may also contact your provider using MyChart. We now offer e-Visits for anyone 56 and older to request care online for non-urgent symptoms. For details visit [email or web address].   Also download the MyChart app! Go to the app store, search "MyChart", open the app, select [facility], and log in with your MyChart username and password.

## 2024-02-25 ENCOUNTER — Inpatient Hospital Stay

## 2024-02-25 ENCOUNTER — Encounter (HOSPITAL_COMMUNITY): Payer: Self-pay | Admitting: Hematology & Oncology

## 2024-02-25 VITALS — BP 143/79 | HR 79 | Temp 97.6°F | Resp 18

## 2024-02-25 DIAGNOSIS — Z5112 Encounter for antineoplastic immunotherapy: Secondary | ICD-10-CM | POA: Diagnosis not present

## 2024-02-25 DIAGNOSIS — C3411 Malignant neoplasm of upper lobe, right bronchus or lung: Secondary | ICD-10-CM

## 2024-02-25 MED ORDER — SODIUM CHLORIDE 0.9 % IV SOLN: INTRAVENOUS | Status: DC

## 2024-02-25 MED ORDER — SODIUM CHLORIDE 0.9% FLUSH
10.0000 mL | INTRAVENOUS | Status: DC | PRN
Start: 2024-02-25 — End: 2024-02-25
  Administered 2024-02-25: 10 mL

## 2024-02-25 MED ORDER — HEPARIN SOD (PORK) LOCK FLUSH 100 UNIT/ML IV SOLN
500.0000 [IU] | Freq: Once | INTRAVENOUS | Status: AC | PRN
  Administered 2024-02-25: 500 [IU]

## 2024-02-25 MED ORDER — DEXAMETHASONE SODIUM PHOSPHATE 10 MG/ML IJ SOLN
10.0000 mg | Freq: Once | INTRAMUSCULAR | Status: AC
  Administered 2024-02-25: 10 mg via INTRAVENOUS
  Filled 2024-02-25: qty 1

## 2024-02-25 MED ORDER — SODIUM CHLORIDE 0.9 % IV SOLN
80.0000 mg/m2 | Freq: Once | INTRAVENOUS | Status: AC
  Administered 2024-02-25: 144 mg via INTRAVENOUS
  Filled 2024-02-25: qty 7.2

## 2024-02-25 NOTE — Patient Instructions (Signed)
 [facility] [facility] [facility]  Discharge Instructions: Thank you for choosing [facility] to provide your oncology and hematology care.   If you have a lab appointment with the Cancer Center, please go directly to the Cancer Center and check in at the registration area.  Wear comfortable clothing and clothing appropriate for easy access to any Portacath or PICC line.   We strive to give you quality time with your provider. You may need to reschedule your appointment if you arrive late (15 or more minutes).  Arriving late affects you and other patients whose appointments are after yours.  Also, if you miss three or more appointments without notifying the office, you may be dismissed from the clinic at the provider's discretion.      For prescription refill requests, have your pharmacy contact our office and allow 72 hours for refills to be completed.    Today you received the following chemotherapy and/or immunotherapy agents Etoposide       To help prevent nausea and vomiting after your treatment, we encourage you to take your nausea medication as directed.  BELOW ARE SYMPTOMS THAT SHOULD BE REPORTED IMMEDIATELY: *FEVER GREATER THAN 100.4 F (38 C) OR HIGHER *CHILLS OR SWEATING *NAUSEA AND VOMITING THAT IS NOT CONTROLLED WITH YOUR NAUSEA MEDICATION *UNUSUAL SHORTNESS OF BREATH *UNUSUAL BRUISING OR BLEEDING *URINARY PROBLEMS (pain or burning when urinating, or frequent urination) *BOWEL PROBLEMS (unusual diarrhea, constipation, pain near the anus) TENDERNESS IN MOUTH AND THROAT WITH OR WITHOUT PRESENCE OF ULCERS (sore throat, sores in mouth, or a toothache) UNUSUAL RASH, SWELLING OR PAIN  UNUSUAL VAGINAL DISCHARGE OR ITCHING   Items with * indicate a potential emergency and should be followed up as soon as possible or go to the Emergency Department if any problems should occur.  Please show the CHEMOTHERAPY ALERT CARD or IMMUNOTHERAPY  ALERT CARD at check-in to the Emergency Department and triage nurse. Should you have questions after your visit or need to cancel or reschedule your appointment, please contact [facility] [place] - A DEPT OF [facility]  [phone] and follow the prompts.  Office hours are 8:00 a.m. to 4:30 p.m. Monday - Friday. Please note that voicemails left after 4:00 p.m. may not be returned until the following business day.  We are closed weekends and major holidays. You have access to a nurse at all times for urgent questions. Please call the main number to the clinic [phone] and follow the prompts.  For any non-urgent questions, you may also contact your provider using MyChart. We now offer e-Visits for anyone 13 and older to request care online for non-urgent symptoms. For details visit [email or web address].   Also download the MyChart app! Go to the app store, search "MyChart", open the app, select [place], and log in with your MyChart username and password.

## 2024-02-25 NOTE — Progress Notes (Signed)
 Hold Udenyca this cycle per MD. Greggory Keen orders deleted for 02/28/24.  Anola Gurney McFarlan, [place], BCPS, BCOP 02/25/2024 11:16 AM

## 2024-02-28 ENCOUNTER — Inpatient Hospital Stay

## 2024-03-08 ENCOUNTER — Inpatient Hospital Stay: Payer: 59

## 2024-03-08 ENCOUNTER — Ambulatory Visit: Payer: 59

## 2024-03-08 ENCOUNTER — Other Ambulatory Visit: Payer: 59

## 2024-03-08 ENCOUNTER — Ambulatory Visit: Payer: 59 | Admitting: Hematology & Oncology

## 2024-03-08 ENCOUNTER — Other Ambulatory Visit: Payer: Self-pay | Admitting: *Deleted

## 2024-03-08 MED ORDER — VITAMIN D (ERGOCALCIFEROL) 1.25 MG (50000 UNIT) PO CAPS: 50000.0000 [IU] | ORAL_CAPSULE | ORAL | 2 refills | Status: DC

## 2024-03-15 ENCOUNTER — Inpatient Hospital Stay: Admitting: Hematology & Oncology

## 2024-03-15 ENCOUNTER — Inpatient Hospital Stay: Attending: Hematology & Oncology

## 2024-03-15 ENCOUNTER — Telehealth: Payer: Self-pay | Admitting: Radiation Oncology

## 2024-03-15 ENCOUNTER — Inpatient Hospital Stay

## 2024-03-15 ENCOUNTER — Encounter: Payer: Self-pay | Admitting: *Deleted

## 2024-03-15 VITALS — BP 142/88 | HR 88 | Temp 98.7°F | Resp 17 | Ht 66.0 in | Wt 146.1 lb

## 2024-03-15 DIAGNOSIS — Z5111 Encounter for antineoplastic chemotherapy: Secondary | ICD-10-CM | POA: Diagnosis present

## 2024-03-15 DIAGNOSIS — Z79899 Other long term (current) drug therapy: Secondary | ICD-10-CM | POA: Diagnosis not present

## 2024-03-15 DIAGNOSIS — C3411 Malignant neoplasm of upper lobe, right bronchus or lung: Secondary | ICD-10-CM

## 2024-03-15 DIAGNOSIS — Z95828 Presence of other vascular implants and grafts: Secondary | ICD-10-CM

## 2024-03-15 DIAGNOSIS — C3490 Malignant neoplasm of unspecified part of unspecified bronchus or lung: Secondary | ICD-10-CM | POA: Insufficient documentation

## 2024-03-15 DIAGNOSIS — Z5112 Encounter for antineoplastic immunotherapy: Secondary | ICD-10-CM | POA: Diagnosis present

## 2024-03-15 LAB — CMP (CANCER CENTER ONLY)
ALT: <5 U/L (ref 0–44)
AST: 9 U/L — ABNORMAL LOW (ref 15–41)
Albumin: 3.7 g/dL (ref 3.5–5.0)
Alkaline Phosphatase: 86 U/L (ref 38–126)
Anion gap: 7 (ref 5–15)
BUN: 5 mg/dL — ABNORMAL LOW (ref 6–20)
CO2: 26 mmol/L (ref 22–32)
Calcium: 9.2 mg/dL (ref 8.9–10.3)
Chloride: 104 mmol/L (ref 98–111)
Creatinine: 0.66 mg/dL (ref 0.44–1.00)
GFR, Estimated: >60 mL/min (ref >60)
Glucose, Bld: 96 mg/dL (ref 70–99)
Potassium: 4.1 mmol/L (ref 3.5–5.1)
Sodium: 137 mmol/L (ref 135–145)
Total Bilirubin: 0.3 mg/dL (ref 0.0–1.2)
Total Protein: 7.5 g/dL (ref 6.5–8.1)

## 2024-03-15 LAB — CBC WITH DIFFERENTIAL (CANCER CENTER ONLY)
Abs Immature Granulocytes: 0.02 10*3/uL (ref 0.00–0.07)
Basophils Absolute: 0 10*3/uL (ref 0.0–0.1)
Basophils Relative: 1 %
Eosinophils Absolute: 0.1 10*3/uL (ref 0.0–0.5)
Eosinophils Relative: 2 %
HCT: 30.1 % — ABNORMAL LOW (ref 36.0–46.0)
Hemoglobin: 9.5 g/dL — ABNORMAL LOW (ref 12.0–15.0)
Immature Granulocytes: 1 %
Lymphocytes Relative: 24 %
Lymphs Abs: 0.5 10*3/uL — ABNORMAL LOW (ref 0.7–4.0)
MCH: 29.5 pg (ref 26.0–34.0)
MCHC: 31.6 g/dL (ref 30.0–36.0)
MCV: 93.5 fL (ref 80.0–100.0)
Monocytes Absolute: 0.2 10*3/uL (ref 0.1–1.0)
Monocytes Relative: 9 %
Neutro Abs: 1.4 10*3/uL — ABNORMAL LOW (ref 1.7–7.7)
Neutrophils Relative %: 63 %
Platelet Count: 163 10*3/uL (ref 150–400)
RBC: 3.22 MIL/uL — ABNORMAL LOW (ref 3.87–5.11)
RDW: 17.5 % — ABNORMAL HIGH (ref 11.5–15.5)
WBC Count: 2.2 10*3/uL — ABNORMAL LOW (ref 4.0–10.5)
nRBC: 0 % (ref 0.0–0.2)

## 2024-03-15 LAB — LACTATE DEHYDROGENASE: LDH: 168 U/L (ref 98–192)

## 2024-03-15 LAB — TSH: TSH: 1.479 u[IU]/mL (ref 0.350–4.500)

## 2024-03-15 MED ORDER — SODIUM CHLORIDE 0.9 % IV SOLN
INTRAVENOUS | Status: DC
Start: 2024-03-15 — End: 2024-03-15

## 2024-03-15 MED ORDER — SODIUM CHLORIDE 0.9% FLUSH
10.0000 mL | Freq: Once | INTRAVENOUS | Status: AC
  Administered 2024-03-15: 10 mL via INTRAVENOUS

## 2024-03-15 MED ORDER — SODIUM CHLORIDE 0.9 % IV SOLN
1200.0000 mg | Freq: Once | INTRAVENOUS | Status: AC
  Administered 2024-03-15: 1200 mg via INTRAVENOUS
  Filled 2024-03-15: qty 20

## 2024-03-15 MED ORDER — HEPARIN SOD (PORK) LOCK FLUSH 100 UNIT/ML IV SOLN
500.0000 [IU] | Freq: Once | INTRAVENOUS | Status: AC | PRN
Start: 2024-03-15 — End: 2024-03-15
  Administered 2024-03-15: 500 [IU]

## 2024-03-15 MED ORDER — SODIUM CHLORIDE 0.9% FLUSH
10.0000 mL | INTRAVENOUS | Status: DC | PRN
Start: 2024-03-15 — End: 2024-03-15
  Administered 2024-03-15: 10 mL

## 2024-03-15 NOTE — Telephone Encounter (Signed)
 I called the patient and we reviewed the rationale for whole brain radiation after Dr. Maria Shiner agreed he would recommend this. When I called her she was waiting to see Dr. Maria Shiner in the clinic so I told her I would reach out again tomorrow after she had a chance to discuss some of this also with him.

## 2024-03-15 NOTE — Progress Notes (Signed)
 Hematology and Oncology Follow Up Visit  MIREL HUNDAL [id number] 07/28/1966 58 y.o. 03/15/2024   Principle Diagnosis:  Extensive stage small cell lung cancer --TMB=23  Current Therapy:   Status post cycle 1 of carboplatinum/etoposide + XRT s/p cycle #4 -- start on 12/04/2023 Tecentriq - maintenance start cycle #1 on 03/17/2027     Interim History:  Ms. Hoh is back for follow-up.  She seems doing quite nicely.  I know that Radiation Oncology is thinking about prophylactic cranial radiation.  I think that this would certainly be a decent idea for her.  I told her that the radiation to the brain is a lot different than what she has to the chest.  She still has a bit of a cough.  She has little bit of shortness of breath.  She does have underlying COPD.  She has had no problems with bowels or bladder.  She has had no bleeding.  She has had no leg swelling.  She has had no rashes.  Overall, I would have to say that her performance status is ECOG 1.    Medications:  Current Outpatient Medications:    dexamethasone (DECADRON) 4 MG tablet, Take 2 tablets (8 mg total) by mouth daily. Take 2 tablets by mouth starting the day after last dose of etoposide for one day only.  Repeat every 21 days, Disp: 60 tablet, Rfl: 2   dronabinol ([name]) 2.5 MG capsule, Take 1 capsule (2.5 mg total) by mouth 2 (two) times daily before lunch and supper., Disp: 60 capsule, Rfl: 0   folic acid (FOLVITE) 1 MG tablet, Take 1 tablet (1 mg total) by mouth daily., Disp: 30 tablet, Rfl: 3   levofloxacin (LEVAQUIN) 500 MG tablet, Take 1 tablet (500 mg total) by mouth daily. Start taking Levaquin daily starting on 01/01/2024, Disp: 15 tablet, Rfl: 6   ondansetron (ZOFRAN) 8 MG tablet, Take 1 tablet (8 mg total) by mouth every 8 (eight) hours as needed for nausea or vomiting. Start on third day after Carboplatin, Disp: 30 tablet, Rfl: 1   pantoprazole (PROTONIX) 40 MG tablet, Take 40 mg by mouth every morning., Disp: ,  Rfl:    prochlorperazine (COMPAZINE) 10 MG tablet, Take 1 tablet (10 mg total) by mouth every 6 (six) hours as needed for nausea or vomiting., Disp: 30 tablet, Rfl: 1   sucralfate (CARAFATE) 1 GM/10ML suspension, Take 10 mLs (1 g total) by mouth 4 (four) times daily -  with meals and at bedtime., Disp: 420 mL, Rfl: 0   Vitamin D, Ergocalciferol, (DRISDOL) 1.25 MG (50000 UNIT) CAPS capsule, Take 1 capsule (50,000 Units total) by mouth every 7 (seven) days., Disp: 4 capsule, Rfl: 2   albuterol (VENTOLIN HFA) 108 (90 Base) MCG/ACT inhaler, Inhale 2 puffs into the lungs every 6 (six) hours as needed for wheezing or shortness of breath. (Patient not taking: Reported on 12/30/2023), Disp: , Rfl:    HYDROcodone-acetaminophen (NORCO/VICODIN) 5-325 MG tablet, Take 1 tablet by mouth every 6 (six) hours as needed for moderate pain (pain score 4-6). (Patient not taking: Reported on 03/15/2024), Disp: 20 tablet, Rfl: 0   lidocaine (XYLOCAINE) 2 % solution, Use as directed 15 mLs in the mouth or throat every 6 (six) hours as needed for mouth pain. Please take 15 cc (1 tablespoon) half hour before you try to eat. (Patient not taking: Reported on 03/15/2024), Disp: 300 mL, Rfl: 4  Allergies: No Known Allergies  Past Medical History, Surgical history, Social history, and Family  History were reviewed and updated.  Review of Systems: Review of Systems  Constitutional:  Positive for appetite change and unexpected weight change.  HENT:   Positive for sore throat.   Eyes: Negative.   Respiratory: Negative.    Cardiovascular: Negative.   Gastrointestinal:  Positive for nausea.  Endocrine: Negative.   Genitourinary: Negative.    Musculoskeletal: Negative.   Skin: Negative.   Neurological:  Positive for dizziness.  Hematological: Negative.   Psychiatric/Behavioral: Negative.      Physical Exam: Vital signs show temperature of 98.7.  Pulse 88.  Blood pressure 142/88.  Weight is 146 pounds.    Wt Readings from  Last 3 Encounters:  03/15/24 146 lb 1.3 oz (66.3 kg)  02/23/24 143 lb 1.9 oz (64.9 kg)  02/16/24 148 lb 4 oz (67.2 kg)    Physical Exam Vitals reviewed.  HENT:     Head: Normocephalic and atraumatic.  Eyes:     Pupils: Pupils are equal, round, and reactive to light.  Cardiovascular:     Rate and Rhythm: Normal rate and regular rhythm.     Heart sounds: Normal heart sounds.  Pulmonary:     Effort: Pulmonary effort is normal.     Breath sounds: Normal breath sounds.  Abdominal:     General: Bowel sounds are normal.     Palpations: Abdomen is soft.  Musculoskeletal:        General: No tenderness or deformity. Normal range of motion.     Cervical back: Normal range of motion.  Lymphadenopathy:     Cervical: No cervical adenopathy.  Skin:    General: Skin is warm and dry.     Findings: No erythema or rash.  Neurological:     Mental Status: She is alert and oriented to person, place, and time.  Psychiatric:        Behavior: Behavior normal.        Thought Content: Thought content normal.        Judgment: Judgment normal.      Lab Results  Component Value Date   WBC 2.2 (L) 03/15/2024   HGB 9.5 (L) 03/15/2024   HCT 30.1 (L) 03/15/2024   MCV 93.5 03/15/2024   PLT 163 03/15/2024     Chemistry      Component Value Date/Time   NA 137 03/15/2024 0946   K 4.1 03/15/2024 0946   CL 104 03/15/2024 0946   CO2 26 03/15/2024 0946   BUN 5 (L) 03/15/2024 0946   CREATININE 0.66 03/15/2024 0946      Component Value Date/Time   CALCIUM 9.2 03/15/2024 0946   ALKPHOS 86 03/15/2024 0946   AST 9 (L) 03/15/2024 0946   ALT <5 03/15/2024 0946   BILITOT 0.3 03/15/2024 0946       Impression and Plan: Ms. Borden is a very charming 58 year old Afro-American female with extensive stage small cell lung cancer.  Because of the bulk of her mediastinal disease, we went ahead and gave radiation with chemotherapy.  Again, she had a very nice response by the PET scan.  We will now go  ahead with maintenance Tecentriq.  I think this is very reasonable.  If she is going to have PCI, I do not see a problem with her having this.  We will go ahead and plan to have her come back to see [place] in 3 weeks for her second cycle of Tecentriq.   [name], MD 4/16/202511:07 AM

## 2024-03-15 NOTE — Progress Notes (Signed)
 Patient is here today to begin maintenance immunotherapy. Most recent PET shows excellent treatment response. She will also receive prophylactic cranial radiation.   Oncology Nurse Navigator Documentation     03/15/2024   10:30 AM  Oncology Nurse Navigator Flowsheets  Navigator Follow Up Date: 04/05/2024  Navigator Follow Up Reason: Follow-up Appointment;Chemotherapy  Navigator Location [facility]  Navigator Encounter Type Treatment  Patient Visit Type MedOnc  Treatment Phase Active Tx  Barriers/Navigation Needs Coordination of Care  Interventions Psycho-Social Support  Acuity Level 2-Minimal Needs (1-2 Barriers Identified)  Support Groups/Services Friends and Family  Time Spent with Patient 15

## 2024-03-15 NOTE — Addendum Note (Signed)
 Addended by: Gray Layman R on: 03/15/2024 11:52 AM   Modules accepted: Orders

## 2024-03-15 NOTE — Progress Notes (Signed)
MD reviewed cbc and cmet, VO " ok to treat despite counts"

## 2024-03-16 ENCOUNTER — Inpatient Hospital Stay

## 2024-03-16 ENCOUNTER — Telehealth: Payer: Self-pay | Admitting: Radiation Oncology

## 2024-03-16 ENCOUNTER — Telehealth: Payer: Self-pay

## 2024-03-16 DIAGNOSIS — C3490 Malignant neoplasm of unspecified part of unspecified bronchus or lung: Secondary | ICD-10-CM

## 2024-03-16 MED ORDER — MEMANTINE HCL 10 MG PO TABS: 10.0000 mg | ORAL_TABLET | Freq: Two times a day (BID) | ORAL | 4 refills | Status: DC

## 2024-03-16 MED ORDER — MEMANTINE HCL 5 MG PO TABS: ORAL_TABLET | ORAL | 0 refills | Status: DC

## 2024-03-16 NOTE — Progress Notes (Signed)
 [facility] CSW Progress Note  Clinical Child psychotherapist contacted patient by phone to assess needs and follow up regarding referrals that were made for patient.  She reports her disability application is still pending a decision.  Cancer Services has also contacted patient and has offered gas cards.  Patient reports financial strain due to radiation medical bills.  CSW provided her with [facility] contact information as a resource.  Patient reports having to receive more radiation and she expressed discontent, but realizes she needs to have the treatment.  CSW provided active listening and supportive counseling.    Kennth Peal, LCSW Clinical Social Worker [facility]

## 2024-03-16 NOTE — Telephone Encounter (Signed)
 I spoke with the patient about prophylactic cranial irradiation (PCI). We discussed the rationale for this as well as the need for another MRI. We discussed Dr. Jeryl Moris recommends 10 fractions with hypocampal sparing (provided repeat MRI is negative). We discussed the risks, benefits, short, and long term effects of radiotherapy, as well as the curative intent, and the patient is interested in proceeding. We also discussed Namenda to reduce risks of cognitive dysfunction long term and a new prescription was sent to her pharmacy to begin on the first day of radiation. We will also schedule simulation once we know her MRI date and sign written consent to proceed when she comes for simulation.

## 2024-03-16 NOTE — Telephone Encounter (Signed)
 Clinical Social Work was referred by medical provider for assessment of psychosocial needs.  CSW attempted to contact patient by phone.  Left voicemail with contact information and request for return call.

## 2024-03-17 ENCOUNTER — Other Ambulatory Visit: Payer: Self-pay

## 2024-03-24 ENCOUNTER — Ambulatory Visit (HOSPITAL_COMMUNITY)
Admission: RE | Admit: 2024-03-24 | Discharge: 2024-03-24 | Disposition: A | Discharge status: Home / Self Care | Source: Ambulatory Visit | Attending: Radiation Oncology | Admitting: Radiation Oncology

## 2024-03-24 DIAGNOSIS — C3490 Malignant neoplasm of unspecified part of unspecified bronchus or lung: Secondary | ICD-10-CM | POA: Insufficient documentation

## 2024-03-24 MED ORDER — GADOBUTROL 1 MMOL/ML IV SOLN
6.5000 mL | Freq: Once | INTRAVENOUS | Status: AC | PRN
  Administered 2024-03-24: 6.5 mL via INTRAVENOUS

## 2024-03-26 ENCOUNTER — Other Ambulatory Visit: Payer: Self-pay

## 2024-03-27 ENCOUNTER — Inpatient Hospital Stay: Attending: Hematology & Oncology

## 2024-03-27 NOTE — Progress Notes (Signed)
 [facility] CSW Progress Note  Clinical Child psychotherapist returned patient's phone call.  She was attempting to contact the [facility].  She stated she was able to locate them and had no other needs at this time.   Kennth Peal, LCSW Clinical Social Worker [facility]

## 2024-03-29 NOTE — Progress Notes (Signed)
 Pegfilgrastim  orders discontinued per Dr. Birt Bulla instructions. Patient is now on maintenance Tecentriq  as a single agent.

## 2024-03-30 ENCOUNTER — Ambulatory Visit
Admission: RE | Admit: 2024-03-30 | Discharge: 2024-03-30 | Disposition: A | Discharge status: Home / Self Care | Source: Ambulatory Visit | Attending: Radiation Oncology | Admitting: Radiation Oncology

## 2024-03-30 ENCOUNTER — Telehealth: Payer: Self-pay | Admitting: Radiation Oncology

## 2024-03-30 DIAGNOSIS — Z510 Encounter for antineoplastic radiation therapy: Secondary | ICD-10-CM | POA: Insufficient documentation

## 2024-03-30 DIAGNOSIS — C3411 Malignant neoplasm of upper lobe, right bronchus or lung: Secondary | ICD-10-CM | POA: Insufficient documentation

## 2024-03-30 NOTE — Telephone Encounter (Signed)
 I called the patient to let her know her MRI was negative for metastatic disease so we will give preventative dosing of PCI 25 Gy in 10 fractions. She's scheduled to start on 04/10/24.

## 2024-04-05 ENCOUNTER — Encounter: Payer: Self-pay | Admitting: Hematology & Oncology

## 2024-04-05 ENCOUNTER — Inpatient Hospital Stay (HOSPITAL_BASED_OUTPATIENT_CLINIC_OR_DEPARTMENT_OTHER): Admitting: Hematology & Oncology

## 2024-04-05 ENCOUNTER — Inpatient Hospital Stay: Admitting: Licensed Clinical Social Worker

## 2024-04-05 ENCOUNTER — Inpatient Hospital Stay

## 2024-04-05 ENCOUNTER — Encounter: Payer: Self-pay | Admitting: *Deleted

## 2024-04-05 ENCOUNTER — Inpatient Hospital Stay: Attending: Hematology & Oncology

## 2024-04-05 VITALS — BP 141/71 | HR 86 | Temp 98.1°F | Resp 18 | Ht 66.0 in | Wt 143.1 lb

## 2024-04-05 DIAGNOSIS — C3411 Malignant neoplasm of upper lobe, right bronchus or lung: Secondary | ICD-10-CM | POA: Diagnosis not present

## 2024-04-05 DIAGNOSIS — Z79899 Other long term (current) drug therapy: Secondary | ICD-10-CM | POA: Diagnosis not present

## 2024-04-05 DIAGNOSIS — Z5112 Encounter for antineoplastic immunotherapy: Secondary | ICD-10-CM | POA: Diagnosis present

## 2024-04-05 DIAGNOSIS — C3490 Malignant neoplasm of unspecified part of unspecified bronchus or lung: Secondary | ICD-10-CM | POA: Diagnosis present

## 2024-04-05 DIAGNOSIS — Z5111 Encounter for antineoplastic chemotherapy: Secondary | ICD-10-CM | POA: Insufficient documentation

## 2024-04-05 LAB — TSH: TSH: 0.879 u[IU]/mL (ref 0.350–4.500)

## 2024-04-05 LAB — CMP (CANCER CENTER ONLY)
ALT: <5 U/L (ref 0–44)
AST: 7 U/L — ABNORMAL LOW (ref 15–41)
Albumin: 3.3 g/dL — ABNORMAL LOW (ref 3.5–5.0)
Alkaline Phosphatase: 61 U/L (ref 38–126)
Anion gap: 8 (ref 5–15)
BUN: 8 mg/dL (ref 6–20)
CO2: 26 mmol/L (ref 22–32)
Calcium: 8.6 mg/dL — ABNORMAL LOW (ref 8.9–10.3)
Chloride: 105 mmol/L (ref 98–111)
Creatinine: 0.71 mg/dL (ref 0.44–1.00)
GFR, Estimated: >60 mL/min (ref >60)
Glucose, Bld: 125 mg/dL — ABNORMAL HIGH (ref 70–99)
Potassium: 3.4 mmol/L — ABNORMAL LOW (ref 3.5–5.1)
Sodium: 139 mmol/L (ref 135–145)
Total Bilirubin: 0.2 mg/dL (ref 0.0–1.2)
Total Protein: 8.1 g/dL (ref 6.5–8.1)

## 2024-04-05 LAB — CBC WITH DIFFERENTIAL (CANCER CENTER ONLY)
Abs Immature Granulocytes: 0.06 10*3/uL (ref 0.00–0.07)
Basophils Absolute: 0 10*3/uL (ref 0.0–0.1)
Basophils Relative: 0 %
Eosinophils Absolute: 0.2 10*3/uL (ref 0.0–0.5)
Eosinophils Relative: 3 %
HCT: 29.7 % — ABNORMAL LOW (ref 36.0–46.0)
Hemoglobin: 9.2 g/dL — ABNORMAL LOW (ref 12.0–15.0)
Immature Granulocytes: 1 %
Lymphocytes Relative: 12 %
Lymphs Abs: 0.8 10*3/uL (ref 0.7–4.0)
MCH: 27.8 pg (ref 26.0–34.0)
MCHC: 31 g/dL (ref 30.0–36.0)
MCV: 89.7 fL (ref 80.0–100.0)
Monocytes Absolute: 0.3 10*3/uL (ref 0.1–1.0)
Monocytes Relative: 4 %
Neutro Abs: 5.7 10*3/uL (ref 1.7–7.7)
Neutrophils Relative %: 80 %
Platelet Count: 283 10*3/uL (ref 150–400)
RBC: 3.31 MIL/uL — ABNORMAL LOW (ref 3.87–5.11)
RDW: 17.4 % — ABNORMAL HIGH (ref 11.5–15.5)
WBC Count: 7 10*3/uL (ref 4.0–10.5)
nRBC: 0 % (ref 0.0–0.2)

## 2024-04-05 LAB — LACTATE DEHYDROGENASE: LDH: 174 U/L (ref 98–192)

## 2024-04-05 MED ORDER — ALPRAZOLAM 0.5 MG PO TABS: 0.5000 mg | ORAL_TABLET | Freq: Every day | ORAL | 0 refills | Status: DC

## 2024-04-05 MED ORDER — DEXAMETHASONE 4 MG PO TABS: 8.0000 mg | ORAL_TABLET | Freq: Every day | ORAL | 4 refills | Status: DC

## 2024-04-05 MED ORDER — SODIUM CHLORIDE 0.9 % IV SOLN: Freq: Once | INTRAVENOUS | Status: AC

## 2024-04-05 MED ORDER — SODIUM CHLORIDE 0.9% FLUSH: 10.0000 mL | INTRAVENOUS | Status: DC | PRN

## 2024-04-05 MED ORDER — HEPARIN SOD (PORK) LOCK FLUSH 100 UNIT/ML IV SOLN: 500.0000 [IU] | Freq: Once | INTRAVENOUS | Status: DC | PRN

## 2024-04-05 MED ORDER — DRONABINOL 2.5 MG PO CAPS: 2.5000 mg | ORAL_CAPSULE | Freq: Two times a day (BID) | ORAL | 0 refills | Status: DC

## 2024-04-05 MED ORDER — SODIUM CHLORIDE 0.9 % IV SOLN
1200.0000 mg | Freq: Once | INTRAVENOUS | Status: AC
  Administered 2024-04-05: 1200 mg via INTRAVENOUS
  Filled 2024-04-05: qty 20

## 2024-04-05 NOTE — Progress Notes (Signed)
 Patient receiving maintenance and tolerating well. She will start PCI on Monday. She will need another PET.   Oncology Nurse Navigator Documentation     04/05/2024   10:00 AM  Oncology Nurse Navigator Flowsheets  Navigator Follow Up Date: 05/03/2024  Navigator Follow Up Reason: Follow-up Appointment;Chemotherapy  Navigator Location [facility]  Navigator Encounter Type Follow-up Appt  Patient Visit Type MedOnc  Treatment Phase Active Tx  Barriers/Navigation Needs No Barriers At This Time  Interventions None Required  Acuity Level 1-No Barriers  Support Groups/Services Friends and Family  Time Spent with Patient 15

## 2024-04-05 NOTE — Progress Notes (Signed)
 So 1027 the patient Hematology and Oncology Follow Up Visit  Tammy Cordova [id number] 22-Mar-1966 58 y.o. 04/05/2024   Principle Diagnosis:  Extensive stage small cell lung cancer --TMB=23  Current Therapy:   Status post cycle 1 of carboplatinum/etoposide  + XRT s/p cycle #4 -- start on 12/04/2023 Tecentriq  - maintenance start cycle #1 on 03/17/2027 PCI -start on 04/10/2024     Interim History:  Ms. Tammy Cordova is back for follow-up.  She is quite nervous about the cranial radiation therapy.  I do think it would be a good idea for her.  She we will start on Monday.  She is having little bit of shortness of breath.  She does have an inhaler.  She does not use it all that much.  I guess she could probably use a little more often..  She has had a decreased appetite.  She is on [name] .  However, she says that steroids seem to help her eat better I will call in some dexamethasone  for her.  She has had no bleeding.  There is been no problems with bowels or bladder.  She has had no rashes.  There is been no leg swelling.  She has had no headache.  There is no visual changes.  She has had no mouth sores.  Overall, I would say that her performance status about ECOG 1.    Medications:  Current Outpatient Medications:    albuterol  (VENTOLIN  HFA) 108 (90 Base) MCG/ACT inhaler, Inhale 2 puffs into the lungs every 6 (six) hours as needed for wheezing or shortness of breath., Disp: , Rfl:    dronabinol  ([name] ) 2.5 MG capsule, Take 1 capsule (2.5 mg total) by mouth 2 (two) times daily before lunch and supper., Disp: 60 capsule, Rfl: 0   folic acid  (FOLVITE ) 1 MG tablet, Take 1 tablet (1 mg total) by mouth daily., Disp: 30 tablet, Rfl: 3   levofloxacin  (LEVAQUIN ) 500 MG tablet, Take 1 tablet (500 mg total) by mouth daily. Start taking Levaquin  daily starting on 01/01/2024, Disp: 15 tablet, Rfl: 6   ondansetron  (ZOFRAN ) 8 MG tablet, Take 1 tablet (8 mg total) by mouth every 8 (eight) hours as needed for nausea  or vomiting. Start on third day after Carboplatin , Disp: 30 tablet, Rfl: 1   pantoprazole  (PROTONIX ) 40 MG tablet, Take 40 mg by mouth every morning., Disp: , Rfl:    prochlorperazine  (COMPAZINE ) 10 MG tablet, Take 1 tablet (10 mg total) by mouth every 6 (six) hours as needed for nausea or vomiting., Disp: 30 tablet, Rfl: 1   Vitamin D , Ergocalciferol , (DRISDOL ) 1.25 MG (50000 UNIT) CAPS capsule, Take 1 capsule (50,000 Units total) by mouth every 7 (seven) days., Disp: 4 capsule, Rfl: 2   dexamethasone  (DECADRON ) 4 MG tablet, Take 2 tablets (8 mg total) by mouth daily. Take 2 tablets by mouth starting the day after last dose of etoposide  for one day only.  Repeat every 21 days (Patient not taking: Reported on 04/05/2024), Disp: 60 tablet, Rfl: 2   HYDROcodone -acetaminophen  (NORCO/VICODIN) 5-325 MG tablet, Take 1 tablet by mouth every 6 (six) hours as needed for moderate pain (pain score 4-6). (Patient not taking: Reported on 02/16/2024), Disp: 20 tablet, Rfl: 0   lidocaine  (XYLOCAINE ) 2 % solution, Use as directed 15 mLs in the mouth or throat every 6 (six) hours as needed for mouth pain. Please take 15 cc (1 tablespoon) half hour before you try to eat. (Patient not taking: Reported on 02/16/2024), Disp: 300 mL, Rfl: 4  memantine  (NAMENDA ) 10 MG tablet, Take 1 tablet (10 mg total) by mouth 2 (two) times daily. (Patient not taking: Reported on 04/05/2024), Disp: 60 tablet, Rfl: 4   memantine  (NAMENDA ) 5 MG tablet, Begin this prescription the first day of brain radiation. Week 1: take one tablet po qam. Week 2: take one tablet qam and qpm. Week 3: take two tablets qam, and one tablet po q pm. Week 4: take two tablets qam and qpm. Fill subsequent prescription q month. (Patient not taking: Reported on 04/05/2024), Disp: 70 tablet, Rfl: 0   sucralfate  (CARAFATE ) 1 GM/10ML suspension, Take 10 mLs (1 g total) by mouth 4 (four) times daily -  with meals and at bedtime. (Patient not taking: Reported on 04/05/2024), Disp: 420  mL, Rfl: 0  Allergies: No Known Allergies  Past Medical History, Surgical history, Social history, and Family History were reviewed and updated.  Review of Systems: Review of Systems  Constitutional:  Positive for appetite change and unexpected weight change.  HENT:   Positive for sore throat.   Eyes: Negative.   Respiratory: Negative.    Cardiovascular: Negative.   Gastrointestinal:  Positive for nausea.  Endocrine: Negative.   Genitourinary: Negative.    Musculoskeletal: Negative.   Skin: Negative.   Neurological:  Positive for dizziness.  Hematological: Negative.   Psychiatric/Behavioral: Negative.      Physical Exam: Vital signs show temperature of 98.7.  Pulse 88.  Blood pressure 142/88.  Weight is 146 pounds.    Wt Readings from Last 3 Encounters:  04/05/24 143 lb 1.9 oz (64.9 kg)  03/15/24 146 lb 1.3 oz (66.3 kg)  02/23/24 143 lb 1.9 oz (64.9 kg)    Physical Exam Vitals reviewed.  HENT:     Head: Normocephalic and atraumatic.  Eyes:     Pupils: Pupils are equal, round, and reactive to light.  Cardiovascular:     Rate and Rhythm: Normal rate and regular rhythm.     Heart sounds: Normal heart sounds.  Pulmonary:     Effort: Pulmonary effort is normal.     Breath sounds: Normal breath sounds.  Abdominal:     General: Bowel sounds are normal.     Palpations: Abdomen is soft.  Musculoskeletal:        General: No tenderness or deformity. Normal range of motion.     Cervical back: Normal range of motion.  Lymphadenopathy:     Cervical: No cervical adenopathy.  Skin:    General: Skin is warm and dry.     Findings: No erythema or rash.  Neurological:     Mental Status: She is alert and oriented to person, place, and time.  Psychiatric:        Behavior: Behavior normal.        Thought Content: Thought content normal.        Judgment: Judgment normal.      Lab Results  Component Value Date   WBC 7.0 04/05/2024   HGB 9.2 (L) 04/05/2024   HCT 29.7 (L)  04/05/2024   MCV 89.7 04/05/2024   PLT 283 04/05/2024     Chemistry      Component Value Date/Time   NA 137 03/15/2024 0946   K 4.1 03/15/2024 0946   CL 104 03/15/2024 0946   CO2 26 03/15/2024 0946   BUN 5 (L) 03/15/2024 0946   CREATININE 0.66 03/15/2024 0946      Component Value Date/Time   CALCIUM  9.2 03/15/2024 0946   ALKPHOS 86 03/15/2024 0946  AST 9 (L) 03/15/2024 0946   ALT <5 03/15/2024 0946   BILITOT 0.3 03/15/2024 0946       Impression and Plan: Ms. Bucciarelli is a very charming 58 year old Afro-American female with extensive stage small cell lung cancer.  Because of the bulk of her mediastinal disease, we went ahead and gave radiation with chemotherapy.  Again, she had a very nice response by the PET scan.  Her last PET scan was done back in February. Is set up with a another 1.  Will set this up in about 3 weeks.  Again I do not see a problem with her having the cranial radiation therapy.  She will continue her Tecentriq .  I do think this is important for maintenance.  We will plan to get her back in another 3-4 weeks.   Ivor Mars, MD 5/7/202510:27 AM

## 2024-04-05 NOTE — Patient Instructions (Signed)

## 2024-04-05 NOTE — Patient Instructions (Signed)
 [facility] - A DEPT OF [facility]  Discharge Instructions: Thank you for choosing [facility] to provide your oncology and hematology care.   If you have a lab appointment with the Cancer Center, please go directly to the Cancer Center and check in at the registration area.  Wear comfortable clothing and clothing appropriate for easy access to any Portacath or PICC line.   We strive to give you quality time with your provider. You may need to reschedule your appointment if you arrive late (15 or more minutes).  Arriving late affects you and other patients whose appointments are after yours.  Also, if you miss three or more appointments without notifying the office, you may be dismissed from the clinic at the provider's discretion.      For prescription refill requests, have your pharmacy contact our office and allow 72 hours for refills to be completed.    Today you received the following chemotherapy and/or immunotherapy agents Tecentriq      To help prevent nausea and vomiting after your treatment, we encourage you to take your nausea medication as directed.  BELOW ARE SYMPTOMS THAT SHOULD BE REPORTED IMMEDIATELY: *FEVER GREATER THAN 100.4 F (38 C) OR HIGHER *CHILLS OR SWEATING *NAUSEA AND VOMITING THAT IS NOT CONTROLLED WITH YOUR NAUSEA MEDICATION *UNUSUAL SHORTNESS OF BREATH *UNUSUAL BRUISING OR BLEEDING *URINARY PROBLEMS (pain or burning when urinating, or frequent urination) *BOWEL PROBLEMS (unusual diarrhea, constipation, pain near the anus) TENDERNESS IN MOUTH AND THROAT WITH OR WITHOUT PRESENCE OF ULCERS (sore throat, sores in mouth, or a toothache) UNUSUAL RASH, SWELLING OR PAIN  UNUSUAL VAGINAL DISCHARGE OR ITCHING   Items with * indicate a potential emergency and should be followed up as soon as possible or go to the Emergency Department if any problems should occur.  Please show the CHEMOTHERAPY ALERT CARD or IMMUNOTHERAPY  ALERT CARD at check-in to the Emergency Department and triage nurse. Should you have questions after your visit or need to cancel or reschedule your appointment, please contact [facility] - A DEPT OF [facility]  [phone] and follow the prompts.  Office hours are 8:00 a.m. to 4:30 p.m. Monday - Friday. Please note that voicemails left after 4:00 p.m. may not be returned until the following business day.  We are closed weekends and major holidays. You have access to a nurse at all times for urgent questions. Please call the main number to the clinic [phone] and follow the prompts.  For any non-urgent questions, you may also contact your provider using MyChart. We now offer e-Visits for anyone 74 and older to request care online for non-urgent symptoms. For details visit [email or web address].   Also download the MyChart app! Go to the app store, search "MyChart", open the app, select [place], and log in with your MyChart username and password.

## 2024-04-05 NOTE — Progress Notes (Signed)
 [facility] CSW Progress Note  Visual merchandiser met with patient in infusion to offer active listening and supportive counseling.  Patient stated she enjoyed the free massages and inquired about additional ones.  CSW provided information about the cost of community massages at [facility] and she said she understood.  Patient expressed no needs at this time.    Kennth Peal, LCSW Clinical Social Worker [facility]

## 2024-04-06 ENCOUNTER — Encounter (HOSPITAL_COMMUNITY): Payer: Self-pay | Admitting: Hematology & Oncology

## 2024-04-07 ENCOUNTER — Other Ambulatory Visit: Payer: Self-pay

## 2024-04-10 ENCOUNTER — Ambulatory Visit: Admitting: Radiation Oncology

## 2024-04-11 ENCOUNTER — Ambulatory Visit: Admitting: Radiation Oncology

## 2024-04-12 ENCOUNTER — Ambulatory Visit

## 2024-04-12 ENCOUNTER — Other Ambulatory Visit: Payer: Self-pay

## 2024-04-12 DIAGNOSIS — C3411 Malignant neoplasm of upper lobe, right bronchus or lung: Secondary | ICD-10-CM | POA: Diagnosis not present

## 2024-04-13 ENCOUNTER — Ambulatory Visit

## 2024-04-13 ENCOUNTER — Other Ambulatory Visit: Payer: Self-pay

## 2024-04-13 ENCOUNTER — Ambulatory Visit
Admission: RE | Admit: 2024-04-13 | Discharge: 2024-04-13 | Disposition: A | Discharge status: Home / Self Care | Source: Ambulatory Visit | Attending: Radiation Oncology | Admitting: Radiation Oncology

## 2024-04-13 DIAGNOSIS — C3411 Malignant neoplasm of upper lobe, right bronchus or lung: Secondary | ICD-10-CM | POA: Diagnosis not present

## 2024-04-13 LAB — RAD ONC ARIA SESSION SUMMARY
Course Elapsed Days: 0
Plan Fractions Treated to Date: 1
Plan Prescribed Dose Per Fraction: 2.5 Gy
Plan Total Fractions Prescribed: 10
Plan Total Prescribed Dose: 25 Gy
Reference Point Dosage Given to Date: 2.5 Gy
Reference Point Session Dosage Given: 2.5 Gy
Session Number: 1

## 2024-04-14 ENCOUNTER — Ambulatory Visit

## 2024-04-14 ENCOUNTER — Ambulatory Visit
Admission: RE | Admit: 2024-04-14 | Discharge: 2024-04-14 | Disposition: A | Discharge status: Home / Self Care | Source: Ambulatory Visit | Attending: Radiation Oncology | Admitting: Radiation Oncology

## 2024-04-14 ENCOUNTER — Other Ambulatory Visit: Payer: Self-pay

## 2024-04-14 DIAGNOSIS — C3411 Malignant neoplasm of upper lobe, right bronchus or lung: Secondary | ICD-10-CM | POA: Diagnosis not present

## 2024-04-14 LAB — RAD ONC ARIA SESSION SUMMARY
Course Elapsed Days: 1
Plan Fractions Treated to Date: 2
Plan Prescribed Dose Per Fraction: 2.5 Gy
Plan Total Fractions Prescribed: 10
Plan Total Prescribed Dose: 25 Gy
Reference Point Dosage Given to Date: 5 Gy
Reference Point Session Dosage Given: 2.5 Gy
Session Number: 2

## 2024-04-17 ENCOUNTER — Other Ambulatory Visit: Payer: Self-pay

## 2024-04-17 ENCOUNTER — Ambulatory Visit
Admission: RE | Admit: 2024-04-17 | Discharge: 2024-04-17 | Disposition: A | Discharge status: Home / Self Care | Source: Ambulatory Visit | Attending: Radiation Oncology | Admitting: Radiation Oncology

## 2024-04-17 DIAGNOSIS — C3411 Malignant neoplasm of upper lobe, right bronchus or lung: Secondary | ICD-10-CM | POA: Diagnosis not present

## 2024-04-17 LAB — RAD ONC ARIA SESSION SUMMARY
Course Elapsed Days: 4
Plan Fractions Treated to Date: 3
Plan Prescribed Dose Per Fraction: 2.5 Gy
Plan Total Fractions Prescribed: 10
Plan Total Prescribed Dose: 25 Gy
Reference Point Dosage Given to Date: 7.5 Gy
Reference Point Session Dosage Given: 2.5 Gy
Session Number: 3

## 2024-04-18 ENCOUNTER — Other Ambulatory Visit: Payer: Self-pay

## 2024-04-18 ENCOUNTER — Ambulatory Visit
Admission: RE | Admit: 2024-04-18 | Discharge: 2024-04-18 | Disposition: A | Discharge status: Home / Self Care | Source: Ambulatory Visit | Attending: Radiation Oncology | Admitting: Radiation Oncology

## 2024-04-18 DIAGNOSIS — C3411 Malignant neoplasm of upper lobe, right bronchus or lung: Secondary | ICD-10-CM | POA: Diagnosis not present

## 2024-04-18 LAB — RAD ONC ARIA SESSION SUMMARY
Course Elapsed Days: 5
Plan Fractions Treated to Date: 4
Plan Prescribed Dose Per Fraction: 2.5 Gy
Plan Total Fractions Prescribed: 10
Plan Total Prescribed Dose: 25 Gy
Reference Point Dosage Given to Date: 10 Gy
Reference Point Session Dosage Given: 2.5 Gy
Session Number: 4

## 2024-04-19 ENCOUNTER — Other Ambulatory Visit: Payer: Self-pay

## 2024-04-19 ENCOUNTER — Ambulatory Visit
Admission: RE | Admit: 2024-04-19 | Discharge: 2024-04-19 | Disposition: A | Discharge status: Home / Self Care | Source: Ambulatory Visit | Attending: Radiation Oncology | Admitting: Radiation Oncology

## 2024-04-19 DIAGNOSIS — C3411 Malignant neoplasm of upper lobe, right bronchus or lung: Secondary | ICD-10-CM | POA: Diagnosis not present

## 2024-04-19 LAB — RAD ONC ARIA SESSION SUMMARY
Course Elapsed Days: 6
Plan Fractions Treated to Date: 5
Plan Prescribed Dose Per Fraction: 2.5 Gy
Plan Total Fractions Prescribed: 10
Plan Total Prescribed Dose: 25 Gy
Reference Point Dosage Given to Date: 12.5 Gy
Reference Point Session Dosage Given: 2.5 Gy
Session Number: 5

## 2024-04-20 ENCOUNTER — Ambulatory Visit
Admission: RE | Admit: 2024-04-20 | Discharge: 2024-04-20 | Disposition: A | Discharge status: Home / Self Care | Source: Ambulatory Visit | Attending: Radiation Oncology | Admitting: Radiation Oncology

## 2024-04-20 ENCOUNTER — Other Ambulatory Visit: Payer: Self-pay

## 2024-04-20 DIAGNOSIS — C3411 Malignant neoplasm of upper lobe, right bronchus or lung: Secondary | ICD-10-CM | POA: Diagnosis not present

## 2024-04-20 LAB — RAD ONC ARIA SESSION SUMMARY
Course Elapsed Days: 7
Plan Fractions Treated to Date: 6
Plan Prescribed Dose Per Fraction: 2.5 Gy
Plan Total Fractions Prescribed: 10
Plan Total Prescribed Dose: 25 Gy
Reference Point Dosage Given to Date: 15 Gy
Reference Point Session Dosage Given: 2.5 Gy
Session Number: 6

## 2024-04-21 ENCOUNTER — Telehealth: Payer: Self-pay

## 2024-04-21 ENCOUNTER — Ambulatory Visit
Admission: RE | Admit: 2024-04-21 | Discharge: 2024-04-21 | Disposition: A | Discharge status: Home / Self Care | Source: Ambulatory Visit | Attending: Radiation Oncology | Admitting: Radiation Oncology

## 2024-04-21 ENCOUNTER — Other Ambulatory Visit: Payer: Self-pay

## 2024-04-21 ENCOUNTER — Ambulatory Visit

## 2024-04-21 DIAGNOSIS — C3411 Malignant neoplasm of upper lobe, right bronchus or lung: Secondary | ICD-10-CM | POA: Diagnosis not present

## 2024-04-21 LAB — RAD ONC ARIA SESSION SUMMARY
Course Elapsed Days: 8
Plan Fractions Treated to Date: 7
Plan Prescribed Dose Per Fraction: 2.5 Gy
Plan Total Fractions Prescribed: 10
Plan Total Prescribed Dose: 25 Gy
Reference Point Dosage Given to Date: 17.5 Gy
Reference Point Session Dosage Given: 2.5 Gy
Session Number: 7

## 2024-04-21 NOTE — Telephone Encounter (Signed)
 Notified the pt regarding her Disability forms being completed,faxed,and confirmation received. Pt request her hard copy be mailed to address. No questions or concerns at this time.

## 2024-04-23 ENCOUNTER — Other Ambulatory Visit: Payer: Self-pay

## 2024-04-25 ENCOUNTER — Ambulatory Visit

## 2024-04-25 ENCOUNTER — Other Ambulatory Visit: Payer: Self-pay | Admitting: Internal Medicine

## 2024-04-25 ENCOUNTER — Ambulatory Visit
Admission: RE | Admit: 2024-04-25 | Discharge: 2024-04-25 | Disposition: A | Discharge status: Home / Self Care | Source: Ambulatory Visit | Attending: Radiation Oncology | Admitting: Radiation Oncology

## 2024-04-25 ENCOUNTER — Other Ambulatory Visit: Payer: Self-pay

## 2024-04-25 DIAGNOSIS — Z1231 Encounter for screening mammogram for malignant neoplasm of breast: Secondary | ICD-10-CM

## 2024-04-25 DIAGNOSIS — C3411 Malignant neoplasm of upper lobe, right bronchus or lung: Secondary | ICD-10-CM | POA: Diagnosis not present

## 2024-04-25 LAB — RAD ONC ARIA SESSION SUMMARY
Course Elapsed Days: 12
Plan Fractions Treated to Date: 8
Plan Prescribed Dose Per Fraction: 2.5 Gy
Plan Total Fractions Prescribed: 10
Plan Total Prescribed Dose: 25 Gy
Reference Point Dosage Given to Date: 20 Gy
Reference Point Session Dosage Given: 2.5 Gy
Session Number: 8

## 2024-04-26 ENCOUNTER — Inpatient Hospital Stay

## 2024-04-26 ENCOUNTER — Inpatient Hospital Stay: Admitting: Hematology & Oncology

## 2024-04-26 ENCOUNTER — Ambulatory Visit
Admission: RE | Admit: 2024-04-26 | Discharge: 2024-04-26 | Disposition: A | Discharge status: Home / Self Care | Source: Ambulatory Visit | Attending: Radiation Oncology | Admitting: Radiation Oncology

## 2024-04-26 ENCOUNTER — Ambulatory Visit

## 2024-04-26 ENCOUNTER — Other Ambulatory Visit: Payer: Self-pay

## 2024-04-26 DIAGNOSIS — C3411 Malignant neoplasm of upper lobe, right bronchus or lung: Secondary | ICD-10-CM | POA: Diagnosis not present

## 2024-04-26 LAB — RAD ONC ARIA SESSION SUMMARY
Course Elapsed Days: 13
Plan Fractions Treated to Date: 9
Plan Prescribed Dose Per Fraction: 2.5 Gy
Plan Total Fractions Prescribed: 10
Plan Total Prescribed Dose: 25 Gy
Reference Point Dosage Given to Date: 22.5 Gy
Reference Point Session Dosage Given: 2.5 Gy
Session Number: 9

## 2024-04-27 ENCOUNTER — Ambulatory Visit

## 2024-04-27 ENCOUNTER — Ambulatory Visit
Admission: RE | Admit: 2024-04-27 | Discharge: 2024-04-27 | Disposition: A | Discharge status: Home / Self Care | Source: Ambulatory Visit | Attending: Radiation Oncology | Admitting: Radiation Oncology

## 2024-04-27 ENCOUNTER — Other Ambulatory Visit: Payer: Self-pay

## 2024-04-27 DIAGNOSIS — C3411 Malignant neoplasm of upper lobe, right bronchus or lung: Secondary | ICD-10-CM | POA: Diagnosis not present

## 2024-04-27 LAB — RAD ONC ARIA SESSION SUMMARY
Course Elapsed Days: 14
Plan Fractions Treated to Date: 10
Plan Prescribed Dose Per Fraction: 2.5 Gy
Plan Total Fractions Prescribed: 10
Plan Total Prescribed Dose: 25 Gy
Reference Point Dosage Given to Date: 25 Gy
Reference Point Session Dosage Given: 2.5 Gy
Session Number: 10

## 2024-04-28 ENCOUNTER — Ambulatory Visit

## 2024-04-28 NOTE — Radiation Completion Notes (Addendum)
  Radiation Oncology         [phone] ________________________________  Name: Tammy Cordova MRN: [id number]  Date of Service: 04/27/2024  DOB: 06/04/1966  End of Treatment Note   Diagnosis:  Extensive Stage Small Cell Carcinoma of the RUL   Intent: Curative     ==========DELIVERED PLANS==========  First Treatment Date: 2024-04-13 Last Treatment Date: 2024-04-27   Plan Name: Brain_HA_PCI Site: Brain Technique: IMRT Mode: Photon Dose Per Fraction: 2.5 Gy Prescribed Dose (Delivered / Prescribed): 25 Gy / 25 Gy Prescribed Fxs (Delivered / Prescribed): 10 / 10     ==========ON TREATMENT VISIT DATES========== 2024-04-14, 2024-04-21    See weekly On Treatment Notes in Epic for details in the Media tab (listed as Progress notes on the On Treatment Visit Dates listed above). The patient tolerated radiation. She developed fatigue and some nausea during treatment. Dr. Maria Shiner started her on oral dexamethasone  on 04/05/24 due to poor appetite.  The patient will receive a call in about one month from the radiation oncology department. She will continue follow up with Dr. Maria Shiner as well as be followed in surveillance with brain MRI scans every 3-6 months in our brain oncology conference.     Shelvia Dick, PAC

## 2024-05-01 ENCOUNTER — Inpatient Hospital Stay: Attending: Hematology & Oncology

## 2024-05-01 ENCOUNTER — Ambulatory Visit

## 2024-05-01 DIAGNOSIS — R1310 Dysphagia, unspecified: Secondary | ICD-10-CM | POA: Insufficient documentation

## 2024-05-01 DIAGNOSIS — C3490 Malignant neoplasm of unspecified part of unspecified bronchus or lung: Secondary | ICD-10-CM | POA: Insufficient documentation

## 2024-05-01 DIAGNOSIS — Z5112 Encounter for antineoplastic immunotherapy: Secondary | ICD-10-CM | POA: Insufficient documentation

## 2024-05-01 DIAGNOSIS — Z5111 Encounter for antineoplastic chemotherapy: Secondary | ICD-10-CM | POA: Insufficient documentation

## 2024-05-01 DIAGNOSIS — Z79899 Other long term (current) drug therapy: Secondary | ICD-10-CM | POA: Insufficient documentation

## 2024-05-01 NOTE — Progress Notes (Signed)
 [facility] CSW Progress Note  Clinical Child psychotherapist received call from patient inquiring about grants.  She recently began receiving food stamps.  She meets the financial requirement for the [employer].  CSW sent the referral to [facility].  Patient stated she was informed that she will be receiving social security disability beginning in July.  Patient expressed no other needs.   Kennth Peal, LCSW Clinical Social Worker [facility]

## 2024-05-02 ENCOUNTER — Ambulatory Visit

## 2024-05-03 ENCOUNTER — Encounter: Payer: Self-pay | Admitting: Hematology & Oncology

## 2024-05-03 ENCOUNTER — Inpatient Hospital Stay

## 2024-05-03 ENCOUNTER — Encounter (HOSPITAL_COMMUNITY): Payer: Self-pay | Admitting: Hematology & Oncology

## 2024-05-03 ENCOUNTER — Other Ambulatory Visit: Payer: Self-pay | Admitting: Radiation Oncology

## 2024-05-03 ENCOUNTER — Encounter: Payer: Self-pay | Admitting: *Deleted

## 2024-05-03 ENCOUNTER — Other Ambulatory Visit: Payer: Self-pay

## 2024-05-03 ENCOUNTER — Ambulatory Visit

## 2024-05-03 ENCOUNTER — Inpatient Hospital Stay (HOSPITAL_BASED_OUTPATIENT_CLINIC_OR_DEPARTMENT_OTHER): Admitting: Hematology & Oncology

## 2024-05-03 VITALS — BP 153/76 | HR 80 | Temp 98.7°F | Resp 18 | Ht 66.0 in | Wt 146.0 lb

## 2024-05-03 VITALS — BP 156/79 | HR 88

## 2024-05-03 DIAGNOSIS — C3411 Malignant neoplasm of upper lobe, right bronchus or lung: Secondary | ICD-10-CM

## 2024-05-03 DIAGNOSIS — C3490 Malignant neoplasm of unspecified part of unspecified bronchus or lung: Secondary | ICD-10-CM

## 2024-05-03 DIAGNOSIS — R1310 Dysphagia, unspecified: Secondary | ICD-10-CM | POA: Diagnosis not present

## 2024-05-03 DIAGNOSIS — Z5112 Encounter for antineoplastic immunotherapy: Secondary | ICD-10-CM | POA: Diagnosis present

## 2024-05-03 DIAGNOSIS — Z79899 Other long term (current) drug therapy: Secondary | ICD-10-CM | POA: Diagnosis not present

## 2024-05-03 DIAGNOSIS — Z5111 Encounter for antineoplastic chemotherapy: Secondary | ICD-10-CM | POA: Diagnosis present

## 2024-05-03 DIAGNOSIS — Z1231 Encounter for screening mammogram for malignant neoplasm of breast: Secondary | ICD-10-CM

## 2024-05-03 LAB — CBC WITH DIFFERENTIAL (CANCER CENTER ONLY)
Abs Immature Granulocytes: 0.01 10*3/uL (ref 0.00–0.07)
Basophils Absolute: 0 10*3/uL (ref 0.0–0.1)
Basophils Relative: 0 %
Eosinophils Absolute: 0.1 10*3/uL (ref 0.0–0.5)
Eosinophils Relative: 4 %
HCT: 34.5 % — ABNORMAL LOW (ref 36.0–46.0)
Hemoglobin: 10.8 g/dL — ABNORMAL LOW (ref 12.0–15.0)
Immature Granulocytes: 0 %
Lymphocytes Relative: 14 %
Lymphs Abs: 0.6 10*3/uL — ABNORMAL LOW (ref 0.7–4.0)
MCH: 27.7 pg (ref 26.0–34.0)
MCHC: 31.3 g/dL (ref 30.0–36.0)
MCV: 88.5 fL (ref 80.0–100.0)
Monocytes Absolute: 0.2 10*3/uL (ref 0.1–1.0)
Monocytes Relative: 5 %
Neutro Abs: 3 10*3/uL (ref 1.7–7.7)
Neutrophils Relative %: 77 %
Platelet Count: 196 10*3/uL (ref 150–400)
RBC: 3.9 MIL/uL (ref 3.87–5.11)
RDW: 20 % — ABNORMAL HIGH (ref 11.5–15.5)
WBC Count: 3.9 10*3/uL — ABNORMAL LOW (ref 4.0–10.5)
nRBC: 0 % (ref 0.0–0.2)

## 2024-05-03 LAB — LACTATE DEHYDROGENASE: LDH: 161 U/L (ref 98–192)

## 2024-05-03 LAB — CMP (CANCER CENTER ONLY)
ALT: <5 U/L (ref 0–44)
AST: 9 U/L — ABNORMAL LOW (ref 15–41)
Albumin: 3.9 g/dL (ref 3.5–5.0)
Alkaline Phosphatase: 66 U/L (ref 38–126)
Anion gap: 7 (ref 5–15)
BUN: 11 mg/dL (ref 6–20)
CO2: 27 mmol/L (ref 22–32)
Calcium: 8.7 mg/dL — ABNORMAL LOW (ref 8.9–10.3)
Chloride: 108 mmol/L (ref 98–111)
Creatinine: 0.78 mg/dL (ref 0.44–1.00)
GFR, Estimated: >60 mL/min (ref >60)
Glucose, Bld: 107 mg/dL — ABNORMAL HIGH (ref 70–99)
Potassium: 3.6 mmol/L (ref 3.5–5.1)
Sodium: 142 mmol/L (ref 135–145)
Total Bilirubin: 0.4 mg/dL (ref 0.0–1.2)
Total Protein: 7.7 g/dL (ref 6.5–8.1)

## 2024-05-03 MED ORDER — SODIUM CHLORIDE 0.9% FLUSH
10.0000 mL | INTRAVENOUS | Status: DC | PRN
  Administered 2024-05-03: 10 mL via INTRAVENOUS

## 2024-05-03 MED ORDER — SODIUM CHLORIDE 0.9 % IV SOLN: INTRAVENOUS | Status: DC

## 2024-05-03 MED ORDER — SODIUM CHLORIDE 0.9% FLUSH: 3.0000 mL | INTRAVENOUS | Status: DC | PRN

## 2024-05-03 MED ORDER — PANTOPRAZOLE SODIUM 40 MG PO TBEC: 40.0000 mg | DELAYED_RELEASE_TABLET | Freq: Two times a day (BID) | ORAL | 5 refills | Status: DC

## 2024-05-03 MED ORDER — SODIUM CHLORIDE 0.9 % IV SOLN
1200.0000 mg | Freq: Once | INTRAVENOUS | Status: AC
  Administered 2024-05-03: 1200 mg via INTRAVENOUS
  Filled 2024-05-03: qty 20

## 2024-05-03 MED ORDER — SODIUM CHLORIDE 0.9% FLUSH
10.0000 mL | INTRAVENOUS | Status: DC | PRN
  Administered 2024-05-03: 10 mL

## 2024-05-03 MED ORDER — SUCRALFATE 1 GM/10ML PO SUSP: 1.0000 g | Freq: Three times a day (TID) | ORAL | 4 refills | Status: DC

## 2024-05-03 MED ORDER — CEPHALEXIN 500 MG PO CAPS: 500.0000 mg | ORAL_CAPSULE | Freq: Four times a day (QID) | ORAL | 0 refills | Status: DC

## 2024-05-03 MED ORDER — HEPARIN SOD (PORK) LOCK FLUSH 100 UNIT/ML IV SOLN
500.0000 [IU] | Freq: Once | INTRAVENOUS | Status: AC | PRN
  Administered 2024-05-03: 500 [IU]

## 2024-05-03 MED ORDER — HEPARIN SOD (PORK) LOCK FLUSH 100 UNIT/ML IV SOLN: 500.0000 [IU] | Freq: Once | INTRAVENOUS | Status: DC

## 2024-05-03 MED ORDER — HEPARIN SOD (PORK) LOCK FLUSH 100 UNIT/ML IV SOLN
250.0000 [IU] | Freq: Once | INTRAVENOUS | Status: DC | PRN
Start: 2024-05-03 — End: 2024-05-03

## 2024-05-03 MED ORDER — ALTEPLASE 2 MG IJ SOLR
2.0000 mg | Freq: Once | INTRAMUSCULAR | Status: DC | PRN
Start: 2024-05-03 — End: 2024-05-03

## 2024-05-03 NOTE — Progress Notes (Signed)
 Patient has now completed cranial radiation. Her PET is scheduled for tomorrow. She will come back in about three weeks for PET results and probable initiation of maintenance treatment.   Oncology Nurse Navigator Documentation     05/03/2024   10:15 AM  Oncology Nurse Navigator Flowsheets  Navigator Follow Up Date: 05/04/2024  Navigator Follow Up Reason: Scan Review  Navigator Location [facility]  Navigator Encounter Type Appt/Treatment Plan Review  Patient Visit Type MedOnc  Treatment Phase Active Tx  Barriers/Navigation Needs No Barriers At This Time  Interventions None Required  Acuity Level 1-No Barriers  Support Groups/Services Friends and Family  Time Spent with Patient 15

## 2024-05-03 NOTE — Patient Instructions (Signed)

## 2024-05-03 NOTE — Addendum Note (Signed)
 Addended by: Gray Layman R on: 05/03/2024 04:25 PM   Modules accepted: Orders

## 2024-05-03 NOTE — Patient Instructions (Signed)
 [facility] - A DEPT OF [facility]  Discharge Instructions: Thank you for choosing [facility] to provide your oncology and hematology care.   If you have a lab appointment with the Cancer Center, please go directly to the Cancer Center and check in at the registration area.  Wear comfortable clothing and clothing appropriate for easy access to any Portacath or PICC line.   We strive to give you quality time with your provider. You may need to reschedule your appointment if you arrive late (15 or more minutes).  Arriving late affects you and other patients whose appointments are after yours.  Also, if you miss three or more appointments without notifying the office, you may be dismissed from the clinic at the provider's discretion.      For prescription refill requests, have your pharmacy contact our office and allow 72 hours for refills to be completed.    Today you received the following chemotherapy and/or immunotherapy agents Tecentriq      Tecentriq  To help prevent nausea and vomiting after your treatment, we encourage you to take your nausea medication as directed.  BELOW ARE SYMPTOMS THAT SHOULD BE REPORTED IMMEDIATELY: *FEVER GREATER THAN 100.4 F (38 C) OR HIGHER *CHILLS OR SWEATING *NAUSEA AND VOMITING THAT IS NOT CONTROLLED WITH YOUR NAUSEA MEDICATION *UNUSUAL SHORTNESS OF BREATH *UNUSUAL BRUISING OR BLEEDING *URINARY PROBLEMS (pain or burning when urinating, or frequent urination) *BOWEL PROBLEMS (unusual diarrhea, constipation, pain near the anus) TENDERNESS IN MOUTH AND THROAT WITH OR WITHOUT PRESENCE OF ULCERS (sore throat, sores in mouth, or a toothache) UNUSUAL RASH, SWELLING OR PAIN  UNUSUAL VAGINAL DISCHARGE OR ITCHING   Items with * indicate a potential emergency and should be followed up as soon as possible or go to the Emergency Department if any problems should occur.  Please show the CHEMOTHERAPY ALERT CARD or  IMMUNOTHERAPY ALERT CARD at check-in to the Emergency Department and triage nurse. Should you have questions after your visit or need to cancel or reschedule your appointment, please contact [facility] - A DEPT OF [facility]  [phone] and follow the prompts.  Office hours are 8:00 a.m. to 4:30 p.m. Monday - Friday. Please note that voicemails left after 4:00 p.m. may not be returned until the following business day.  We are closed weekends and major holidays. You have access to a nurse at all times for urgent questions. Please call the main number to the clinic [phone] and follow the prompts.  For any non-urgent questions, you may also contact your provider using MyChart. We now offer e-Visits for anyone 40 and older to request care online for non-urgent symptoms. For details visit [email or web address].   Also download the MyChart app! Go to the app store, search "MyChart", open the app, select [place], and log in with your MyChart username and password.

## 2024-05-03 NOTE — Progress Notes (Signed)
 ScreeningSo 1027 the patient Hematology and Oncology Follow Up Visit  Tammy Cordova [id number] 08/26/1966 58 y.o. 05/03/2024   Principle Diagnosis:  Extensive stage small cell lung cancer --TMB=23  Current Therapy:   Status post cycle 1 of carboplatinum/etoposide  + XRT s/p cycle #4 -- start on 12/04/2023 Tecentriq  - maintenance s/p cycle #2 -- start on 03/16/2024 PCI -start on 04/10/2024     Interim History:  Tammy Cordova is back for follow-up.  She pleated the cranial radiation therapy about a week ago.  I think she tolerated this pretty well.  She is worried about some reflux.  She is not taking the Carafate .  I think she really needs to have the Carafate .  I told her to take the Carafate  liquid 3 or 4 times a day..  She has been on Protonix .  I told her to take Protonix  twice a day.  She has had no problems with fever.  She has had no diarrhea.  There has been no bleeding.  She has had no leg swelling.  She is due for a PET scan tomorrow.  Currently, I would have to say that her performance status is probably ECOG 1.    Medications:  Current Outpatient Medications:    albuterol  (VENTOLIN  HFA) 108 (90 Base) MCG/ACT inhaler, Inhale 2 puffs into the lungs every 6 (six) hours as needed for wheezing or shortness of breath., Disp: , Rfl:    ALPRAZolam  (XANAX ) 0.5 MG tablet, Take 1 tablet (0.5 mg total) by mouth daily. Please take half hour before radiation therapy., Disp: 21 tablet, Rfl: 0   dexamethasone  (DECADRON ) 4 MG tablet, Take 2 tablets (8 mg total) by mouth daily. Take 2 tablets by mouth starting the day after last dose of etoposide  for one day only.  Repeat every 21 days (Patient not taking: Reported on 04/05/2024), Disp: 60 tablet, Rfl: 2   dexamethasone  (DECADRON ) 4 MG tablet, Take 2 tablets (8 mg total) by mouth daily., Disp: 60 tablet, Rfl: 4   dronabinol  ([name] ) 2.5 MG capsule, Take 1 capsule (2.5 mg total) by mouth 2 (two) times daily before lunch and supper., Disp: 60  capsule, Rfl: 0   folic acid  (FOLVITE ) 1 MG tablet, Take 1 tablet (1 mg total) by mouth daily., Disp: 30 tablet, Rfl: 3   HYDROcodone -acetaminophen  (NORCO/VICODIN) 5-325 MG tablet, Take 1 tablet by mouth every 6 (six) hours as needed for moderate pain (pain score 4-6). (Patient not taking: Reported on 02/16/2024), Disp: 20 tablet, Rfl: 0   levofloxacin  (LEVAQUIN ) 500 MG tablet, Take 1 tablet (500 mg total) by mouth daily. Start taking Levaquin  daily starting on 01/01/2024, Disp: 15 tablet, Rfl: 6   lidocaine  (XYLOCAINE ) 2 % solution, Use as directed 15 mLs in the mouth or throat every 6 (six) hours as needed for mouth pain. Please take 15 cc (1 tablespoon) half hour before you try to eat. (Patient not taking: Reported on 02/16/2024), Disp: 300 mL, Rfl: 4   memantine  (NAMENDA ) 10 MG tablet, Take 1 tablet (10 mg total) by mouth 2 (two) times daily. (Patient not taking: Reported on 04/05/2024), Disp: 60 tablet, Rfl: 4   memantine  (NAMENDA ) 5 MG tablet, Begin this prescription the first day of brain radiation. Week 1: take one tablet po qam. Week 2: take one tablet qam and qpm. Week 3: take two tablets qam, and one tablet po q pm. Week 4: take two tablets qam and qpm. Fill subsequent prescription q month. (Patient not taking: Reported on 04/05/2024), Disp: 70  tablet, Rfl: 0   ondansetron  (ZOFRAN ) 8 MG tablet, Take 1 tablet (8 mg total) by mouth every 8 (eight) hours as needed for nausea or vomiting. Start on third day after Carboplatin , Disp: 30 tablet, Rfl: 1   pantoprazole  (PROTONIX ) 40 MG tablet, Take 40 mg by mouth every morning., Disp: , Rfl:    prochlorperazine  (COMPAZINE ) 10 MG tablet, Take 1 tablet (10 mg total) by mouth every 6 (six) hours as needed for nausea or vomiting., Disp: 30 tablet, Rfl: 1   sucralfate  (CARAFATE ) 1 GM/10ML suspension, Take 10 mLs (1 g total) by mouth 4 (four) times daily -  with meals and at bedtime. (Patient not taking: Reported on 04/05/2024), Disp: 420 mL, Rfl: 0   Vitamin D ,  Ergocalciferol , (DRISDOL ) 1.25 MG (50000 UNIT) CAPS capsule, Take 1 capsule (50,000 Units total) by mouth every 7 (seven) days., Disp: 4 capsule, Rfl: 2  Allergies: No Known Allergies  Past Medical History, Surgical history, Social history, and Family History were reviewed and updated.  Review of Systems: Review of Systems  Constitutional:  Positive for appetite change and unexpected weight change.  HENT:   Positive for sore throat.   Eyes: Negative.   Respiratory: Negative.    Cardiovascular: Negative.   Gastrointestinal:  Positive for nausea.  Endocrine: Negative.   Genitourinary: Negative.    Musculoskeletal: Negative.   Skin: Negative.   Neurological:  Positive for dizziness.  Hematological: Negative.   Psychiatric/Behavioral: Negative.      Physical Exam: Vital signs show temperature of 98.7.  Pulse 80.  Blood pressure 153/76.  Weight is 146 pounds.   Wt Readings from Last 3 Encounters:  05/03/24 146 lb (66.2 kg)  04/05/24 143 lb 1.9 oz (64.9 kg)  03/15/24 146 lb 1.3 oz (66.3 kg)    Physical Exam Vitals reviewed.  HENT:     Head: Normocephalic and atraumatic.  Eyes:     Pupils: Pupils are equal, round, and reactive to light.  Cardiovascular:     Rate and Rhythm: Normal rate and regular rhythm.     Heart sounds: Normal heart sounds.  Pulmonary:     Effort: Pulmonary effort is normal.     Breath sounds: Normal breath sounds.  Abdominal:     General: Bowel sounds are normal.     Palpations: Abdomen is soft.  Musculoskeletal:        General: No tenderness or deformity. Normal range of motion.     Cervical back: Normal range of motion.  Lymphadenopathy:     Cervical: No cervical adenopathy.  Skin:    General: Skin is warm and dry.     Findings: No erythema or rash.  Neurological:     Mental Status: She is alert and oriented to person, place, and time.  Psychiatric:        Behavior: Behavior normal.        Thought Content: Thought content normal.         Judgment: Judgment normal.      Lab Results  Component Value Date   WBC 3.9 (L) 05/03/2024   HGB 10.8 (L) 05/03/2024   HCT 34.5 (L) 05/03/2024   MCV 88.5 05/03/2024   PLT 196 05/03/2024     Chemistry      Component Value Date/Time   NA 139 04/05/2024 0915   K 3.4 (L) 04/05/2024 0915   CL 105 04/05/2024 0915   CO2 26 04/05/2024 0915   BUN 8 04/05/2024 0915   CREATININE 0.71 04/05/2024 0915  Component Value Date/Time   CALCIUM  8.6 (L) 04/05/2024 0915   ALKPHOS 61 04/05/2024 0915   AST 7 (L) 04/05/2024 0915   ALT <5 04/05/2024 0915   BILITOT 0.2 04/05/2024 0915       Impression and Plan: [name] is a very charming 58 year old Afro-American female with extensive stage small cell lung cancer.  Because of the bulk of her mediastinal disease, we went ahead and gave radiation with chemotherapy.  Again, she had a very nice response by the PET scan.  We will have to see what the PET scan shows this time.  Again she will have it done tomorrow.  I think that she is a good candidate for the maintenance Tecentriq .  She will have her third cycle today.  I think this is reasonable.  We will plan to get her back to see us  in another 3 weeks or so.  Ivor Mars, MD 6/4/202510:41 AM

## 2024-05-04 ENCOUNTER — Other Ambulatory Visit: Payer: Self-pay

## 2024-05-04 ENCOUNTER — Ambulatory Visit

## 2024-05-04 ENCOUNTER — Telehealth: Payer: Self-pay | Admitting: Pharmacy Technician

## 2024-05-04 ENCOUNTER — Encounter (HOSPITAL_COMMUNITY)
Admission: RE | Admit: 2024-05-04 | Discharge: 2024-05-04 | Disposition: A | Discharge status: Home / Self Care | Source: Ambulatory Visit | Attending: Hematology & Oncology | Admitting: Hematology & Oncology

## 2024-05-04 ENCOUNTER — Inpatient Hospital Stay

## 2024-05-04 DIAGNOSIS — C3411 Malignant neoplasm of upper lobe, right bronchus or lung: Secondary | ICD-10-CM

## 2024-05-04 LAB — GLUCOSE, CAPILLARY: Glucose-Capillary: 99 mg/dL (ref 70–99)

## 2024-05-04 MED ORDER — FLUDEOXYGLUCOSE F - 18 (FDG) INJECTION
7.2000 | Freq: Once | INTRAVENOUS | Status: AC | PRN
  Administered 2024-05-04: 7.2 via INTRAVENOUS

## 2024-05-04 MED ORDER — SODIUM CHLORIDE 0.9% FLUSH
10.0000 mL | Freq: Once | INTRAVENOUS | Status: AC
  Administered 2024-05-04: 10 mL via INTRAVENOUS

## 2024-05-04 MED ORDER — HEPARIN SOD (PORK) LOCK FLUSH 100 UNIT/ML IV SOLN
500.0000 [IU] | Freq: Once | INTRAVENOUS | Status: AC
  Administered 2024-05-04: 500 [IU] via INTRAVENOUS

## 2024-05-04 NOTE — Patient Instructions (Signed)

## 2024-05-04 NOTE — Telephone Encounter (Signed)
 Met with patient.  Patient stated that she does not receive food stamps or any other governmental assistance.  Patient is also not uninsured.  I explained to patient that she does not qualify for the [employer].  Patient verbally acknowledged that she understood.  Penny Boxer Patient Services Navigator [facility]

## 2024-05-05 ENCOUNTER — Ambulatory Visit

## 2024-05-08 ENCOUNTER — Ambulatory Visit

## 2024-05-09 ENCOUNTER — Ambulatory Visit

## 2024-05-10 ENCOUNTER — Ambulatory Visit

## 2024-05-11 ENCOUNTER — Ambulatory Visit

## 2024-05-12 ENCOUNTER — Ambulatory Visit

## 2024-05-15 ENCOUNTER — Ambulatory Visit: Payer: Self-pay | Admitting: Hematology & Oncology

## 2024-05-15 ENCOUNTER — Ambulatory Visit

## 2024-05-15 ENCOUNTER — Ambulatory Visit (HOSPITAL_COMMUNITY)
Admission: RE | Admit: 2024-05-15 | Discharge: 2024-05-15 | Disposition: A | Discharge status: Home / Self Care | Source: Ambulatory Visit | Attending: Hematology & Oncology | Admitting: Hematology & Oncology

## 2024-05-15 ENCOUNTER — Other Ambulatory Visit: Payer: Self-pay

## 2024-05-15 ENCOUNTER — Other Ambulatory Visit: Payer: Self-pay | Admitting: Hematology & Oncology

## 2024-05-15 DIAGNOSIS — C3411 Malignant neoplasm of upper lobe, right bronchus or lung: Secondary | ICD-10-CM | POA: Insufficient documentation

## 2024-05-15 NOTE — Telephone Encounter (Signed)
-----   Message from Ivor Mars sent at 05/15/2024 12:25 PM EDT ----- Please call and let her know that there is some narrowing down in the lower esophagus.  We really need to have her see her Gastroenterologist.  Does she have 1?  If not, let me know and I will make a  referral.  Thanks.Aaron Aas ----- Message ----- From: Interface, Rad Results In Sent: 05/15/2024  11:22 AM EDT To: Ivor Mars, MD

## 2024-05-15 NOTE — Telephone Encounter (Signed)
 Advised via MyChart.

## 2024-05-16 ENCOUNTER — Ambulatory Visit
Admission: RE | Admit: 2024-05-16 | Discharge: 2024-05-16 | Disposition: A | Discharge status: Home / Self Care | Source: Ambulatory Visit | Attending: Internal Medicine | Admitting: Internal Medicine

## 2024-05-16 ENCOUNTER — Other Ambulatory Visit: Payer: Self-pay | Admitting: Hematology & Oncology

## 2024-05-16 ENCOUNTER — Ambulatory Visit

## 2024-05-16 ENCOUNTER — Ambulatory Visit: Payer: Self-pay | Admitting: Hematology & Oncology

## 2024-05-17 ENCOUNTER — Ambulatory Visit

## 2024-05-18 ENCOUNTER — Other Ambulatory Visit: Payer: Self-pay | Admitting: Internal Medicine

## 2024-05-18 ENCOUNTER — Ambulatory Visit

## 2024-05-18 DIAGNOSIS — R928 Other abnormal and inconclusive findings on diagnostic imaging of breast: Secondary | ICD-10-CM

## 2024-05-19 ENCOUNTER — Ambulatory Visit

## 2024-05-22 ENCOUNTER — Ambulatory Visit
Admission: RE | Admit: 2024-05-22 | Discharge: 2024-05-22 | Disposition: A | Discharge status: Home / Self Care | Source: Ambulatory Visit | Attending: Radiation Oncology | Admitting: Radiation Oncology

## 2024-05-22 ENCOUNTER — Ambulatory Visit

## 2024-05-22 DIAGNOSIS — C3490 Malignant neoplasm of unspecified part of unspecified bronchus or lung: Secondary | ICD-10-CM

## 2024-05-22 MED ORDER — GADOPICLENOL 0.5 MMOL/ML IV SOLN
7.0000 mL | Freq: Once | INTRAVENOUS | Status: AC | PRN
  Administered 2024-05-22: 7 mL via INTRAVENOUS

## 2024-05-23 ENCOUNTER — Ambulatory Visit
Admission: RE | Admit: 2024-05-23 | Discharge: 2024-05-23 | Disposition: A | Discharge status: Home / Self Care | Source: Ambulatory Visit | Attending: Internal Medicine | Admitting: Internal Medicine

## 2024-05-23 ENCOUNTER — Ambulatory Visit

## 2024-05-23 ENCOUNTER — Telehealth: Payer: Self-pay

## 2024-05-23 ENCOUNTER — Encounter: Payer: Self-pay | Admitting: *Deleted

## 2024-05-23 DIAGNOSIS — R928 Other abnormal and inconclusive findings on diagnostic imaging of breast: Secondary | ICD-10-CM

## 2024-05-23 NOTE — Progress Notes (Signed)
 Reviewed PET which continues to show treatment response.   Oncology Nurse Navigator Documentation     05/23/2024    8:00 AM  Oncology Nurse Navigator Flowsheets  Navigator Follow Up Date: 05/24/2024  Navigator Follow Up Reason: Follow-up Appointment  Navigator Location [facility]  Navigator Encounter Type Scan Review  Patient Visit Type MedOnc  Treatment Phase Active Tx  Barriers/Navigation Needs No Barriers At This Time  Interventions None Required  Acuity Level 1-No Barriers  Support Groups/Services Friends and Family  Time Spent with Patient 15

## 2024-05-23 NOTE — Telephone Encounter (Signed)
 Called [facility] office at [phone] to follow up on patient's referral.  I was told that the referral was received, but the patient was not contacted yey because they  are a month behind on the referrals.

## 2024-05-24 ENCOUNTER — Encounter: Payer: Self-pay | Admitting: Hematology & Oncology

## 2024-05-24 ENCOUNTER — Telehealth: Payer: Self-pay | Admitting: Radiation Oncology

## 2024-05-24 ENCOUNTER — Inpatient Hospital Stay

## 2024-05-24 ENCOUNTER — Encounter: Payer: Self-pay | Admitting: *Deleted

## 2024-05-24 ENCOUNTER — Inpatient Hospital Stay (HOSPITAL_BASED_OUTPATIENT_CLINIC_OR_DEPARTMENT_OTHER): Admitting: Hematology & Oncology

## 2024-05-24 VITALS — BP 136/69 | HR 72 | Temp 98.2°F | Resp 20 | Ht 66.0 in | Wt 154.1 lb

## 2024-05-24 VITALS — BP 133/73 | HR 75 | Temp 98.2°F | Resp 18

## 2024-05-24 DIAGNOSIS — C3411 Malignant neoplasm of upper lobe, right bronchus or lung: Secondary | ICD-10-CM

## 2024-05-24 DIAGNOSIS — C3490 Malignant neoplasm of unspecified part of unspecified bronchus or lung: Secondary | ICD-10-CM

## 2024-05-24 DIAGNOSIS — Z5112 Encounter for antineoplastic immunotherapy: Secondary | ICD-10-CM | POA: Diagnosis not present

## 2024-05-24 LAB — CMP (CANCER CENTER ONLY)
ALT: <5 U/L (ref 0–44)
AST: 9 U/L — ABNORMAL LOW (ref 15–41)
Albumin: 3.7 g/dL (ref 3.5–5.0)
Alkaline Phosphatase: 63 U/L (ref 38–126)
Anion gap: 5 (ref 5–15)
BUN: 8 mg/dL (ref 6–20)
CO2: 29 mmol/L (ref 22–32)
Calcium: 8.9 mg/dL (ref 8.9–10.3)
Chloride: 109 mmol/L (ref 98–111)
Creatinine: 0.69 mg/dL (ref 0.44–1.00)
GFR, Estimated: >60 mL/min (ref >60)
Glucose, Bld: 107 mg/dL — ABNORMAL HIGH (ref 70–99)
Potassium: 4 mmol/L (ref 3.5–5.1)
Sodium: 143 mmol/L (ref 135–145)
Total Bilirubin: 0.3 mg/dL (ref 0.0–1.2)
Total Protein: 6.9 g/dL (ref 6.5–8.1)

## 2024-05-24 LAB — IRON AND IRON BINDING CAPACITY (CC-WL,HP ONLY)
Iron: 53 ug/dL (ref 28–170)
Saturation Ratios: 21 % (ref 10.4–31.8)
TIBC: 251 ug/dL (ref 250–450)
UIBC: 198 ug/dL (ref 148–442)

## 2024-05-24 LAB — CBC WITH DIFFERENTIAL (CANCER CENTER ONLY)
Abs Immature Granulocytes: 0.04 10*3/uL (ref 0.00–0.07)
Basophils Absolute: 0 10*3/uL (ref 0.0–0.1)
Basophils Relative: 0 %
Eosinophils Absolute: 0.1 10*3/uL (ref 0.0–0.5)
Eosinophils Relative: 4 %
HCT: 35.5 % — ABNORMAL LOW (ref 36.0–46.0)
Hemoglobin: 10.9 g/dL — ABNORMAL LOW (ref 12.0–15.0)
Immature Granulocytes: 1 %
Lymphocytes Relative: 18 %
Lymphs Abs: 0.6 10*3/uL — ABNORMAL LOW (ref 0.7–4.0)
MCH: 27.8 pg (ref 26.0–34.0)
MCHC: 30.7 g/dL (ref 30.0–36.0)
MCV: 90.6 fL (ref 80.0–100.0)
Monocytes Absolute: 0.2 10*3/uL (ref 0.1–1.0)
Monocytes Relative: 5 %
Neutro Abs: 2.2 10*3/uL (ref 1.7–7.7)
Neutrophils Relative %: 72 %
Platelet Count: 207 10*3/uL (ref 150–400)
RBC: 3.92 MIL/uL (ref 3.87–5.11)
RDW: 19.9 % — ABNORMAL HIGH (ref 11.5–15.5)
WBC Count: 3.1 10*3/uL — ABNORMAL LOW (ref 4.0–10.5)
nRBC: 0 % (ref 0.0–0.2)

## 2024-05-24 LAB — FERRITIN: Ferritin: 151 ng/mL (ref 11–307)

## 2024-05-24 LAB — RETICULOCYTES
Immature Retic Fract: 22.8 % — ABNORMAL HIGH (ref 2.3–15.9)
RBC.: 3.91 MIL/uL (ref 3.87–5.11)
Retic Count, Absolute: 49.3 10*3/uL (ref 19.0–186.0)
Retic Ct Pct: 1.3 % (ref 0.4–3.1)

## 2024-05-24 LAB — LACTATE DEHYDROGENASE: LDH: 175 U/L (ref 98–192)

## 2024-05-24 MED ORDER — SODIUM CHLORIDE 0.9 % IV SOLN: INTRAVENOUS | Status: DC

## 2024-05-24 MED ORDER — HEPARIN SOD (PORK) LOCK FLUSH 100 UNIT/ML IV SOLN
500.0000 [IU] | Freq: Once | INTRAVENOUS | Status: AC | PRN
  Administered 2024-05-24: 500 [IU]

## 2024-05-24 MED ORDER — FOLIC ACID 1 MG PO TABS
1.0000 mg | ORAL_TABLET | Freq: Every day | ORAL | 3 refills | Status: DC
Start: 2024-05-24 — End: 2024-09-10

## 2024-05-24 MED ORDER — SODIUM CHLORIDE 0.9 % IV SOLN
1200.0000 mg | Freq: Once | INTRAVENOUS | Status: AC
  Administered 2024-05-24: 1200 mg via INTRAVENOUS
  Filled 2024-05-24: qty 20

## 2024-05-24 MED ORDER — SODIUM CHLORIDE 0.9% FLUSH
10.0000 mL | INTRAVENOUS | Status: DC | PRN
  Administered 2024-05-24: 10 mL

## 2024-05-24 NOTE — Telephone Encounter (Signed)
 I called the patient to let her know her MRI scan that I ordered on 05/03/24 wasn't supposed to be performed until September, but the central radiology scheduling team pushed the order through for her MRI which was performed on 6/23. Fortunately the report is reassuring which she and I reviewed, but I let her know I've asked the [facility] radiology team to not charge her since this scan was done unnecessarily, but that I would recommend her next MRI brain be performed with IV contrast and with SRS technique in 3 months. The patient is in agreement and I will update her as I hear more from radiology.

## 2024-05-24 NOTE — Progress Notes (Unsigned)
 Patient proceeded with cycle eight. Awaiting GI consult. Office was called and they did confirm receipt of referral, however they are currently in a backlog and will get to scheduling.   Oncology Nurse Navigator Documentation     05/24/2024   12:45 PM  Oncology Nurse Navigator Flowsheets  Navigator Follow Up Date: 06/14/2024  Navigator Follow Up Reason: Follow-up Appointment;Chemotherapy  Navigator Location [facility]  Navigator Encounter Type Treatment  Patient Visit Type MedOnc  Treatment Phase Active Tx  Barriers/Navigation Needs No Barriers At This Time  Interventions None Required  Acuity Level 1-No Barriers  Support Groups/Services Friends and Family  Time Spent with Patient 15

## 2024-05-24 NOTE — Patient Instructions (Signed)

## 2024-05-24 NOTE — Patient Instructions (Signed)
 Atezolizumab Injection What is this medication? ATEZOLIZUMAB (a te zoe LIZ ue mab) treats some types of cancer. It works by helping your immune system slow or stop the spread of cancer cells. It is a monoclonal antibody. This medicine may be used for other purposes; ask your health care provider or pharmacist if you have questions. COMMON BRAND NAME(S): Tecentriq What should I tell my care team before I take this medication? They need to know if you have any of these conditions: Allogeneic stem cell transplant (uses someone else's stem cells) Autoimmune diseases, such as Crohn disease, ulcerative colitis, lupus History of chest radiation Nervous system problems, such as Guillain-Barre syndrome, myasthenia gravis Organ transplant An unusual or allergic reaction to atezolizumab, other medications, foods, dyes, or preservatives Pregnant or trying to get pregnant Breast-feeding How should I use this medication? This medication is injected into a vein. It is given by your care team in a hospital or clinic setting. A special MedGuide will be given to you before each treatment. Be sure to read this information carefully each time. Talk to your care team about the use of this medication in children. While it may be prescribed for children as young as 2 years for selected conditions, precautions do apply. Overdosage: If you think you have taken too much of this medicine contact a poison control center or emergency room at once. NOTE: This medicine is only for you. Do not share this medicine with others. What if I miss a dose? Keep appointments for follow-up doses. It is important not to miss your dose. Call your care team if you are unable to keep an appointment. What may interact with this medication? Interactions have not been studied. This list may not describe all possible interactions. Give your health care provider a list of all the medicines, herbs, non-prescription drugs, or dietary  supplements you use. Also tell them if you smoke, drink alcohol, or use illegal drugs. Some items may interact with your medicine. What should I watch for while using this medication? Your condition will be monitored carefully while you are receiving this medication. You may need blood work done while you are taking this medication. This medication may cause serious skin reactions. They can happen weeks to months after starting the medication. Contact your care team right away if you notice fevers or flu-like symptoms with a rash. The rash may be red or purple and then turn into blisters or peeling of the skin. You may also notice a red rash with swelling of the face, lips, or lymph nodes in your neck or under your arms. Talk to your care team if you may be pregnant. Serious birth defects can occur if you take this medication during pregnancy and for 5 months after the last dose. You will need a negative pregnancy test before starting this medication. Contraception is recommended while taking this medication and for 5 months after the last dose. Your care team can help you find the option that works for you. Do not breastfeed while taking this medication and for 5 months after the last dose. This medication may cause infertility. Talk to your care team if you are concerned about your fertility. What side effects may I notice from receiving this medication? Side effects that you should report to your doctor or health care professional as soon as possible: Allergic reactions--skin rash, itching, hives, swelling of the face, lips, tongue, or throat Dry cough, shortness of breath or trouble breathing Eye pain, redness, irritation, or discharge  with blurry or decreased vision Heart muscle inflammation--unusual weakness or fatigue, shortness of breath, chest pain, fast or irregular heartbeat, dizziness, swelling of the ankles, feet, or hands Hormone gland problems--headache, sensitivity to light, unusual  weakness or fatigue, dizziness, fast or irregular heartbeat, increased sensitivity to cold or heat, excessive sweating, constipation, hair loss, increased thirst or amount of urine, tremors or shaking, irritability Infusion reactions--chest pain, shortness of breath or trouble breathing, feeling faint or lightheaded Kidney injury (glomerulonephritis)--decrease in the amount of urine, red or dark brown urine, foamy or bubbly urine, swelling of the ankles, hands, or feet Liver injury--right upper belly pain, loss of appetite, nausea, light-colored stool, dark yellow or brown urine, yellowing skin or eyes, unusual weakness or fatigue Pain, tingling, or numbness in the hands or feet, muscle weakness, change in vision, confusion or trouble speaking, loss of balance or coordination, trouble walking, seizures Rash, fever, and swollen lymph nodes Redness, blistering, peeling, or loosening of the skin, including inside the mouth Sudden or severe stomach pain, bloody diarrhea, fever, nausea, vomiting Side effects that usually do not require medical attention (report to your doctor or health care professional if they continue or are bothersome): Bone, joint, or muscle pain Diarrhea Fatigue Loss of appetite Nausea Skin rash This list may not describe all possible side effects. Call your doctor for medical advice about side effects. You may report side effects to FDA at 1-800-FDA-1088. Where should I keep my medication? This medication is given in a hospital or clinic. It will not be stored at home. NOTE: This sheet is a summary. It may not cover all possible information. If you have questions about this medicine, talk to your doctor, pharmacist, or health care provider.  2024 Elsevier/Gold Standard (2023-09-01 00:00:00)

## 2024-05-24 NOTE — Progress Notes (Signed)
 ScreeningSo 1027 the patient Hematology and Oncology Follow Up Visit  Tammy Cordova [id number] [birth year] 58 y.o. 05/24/2024   Principle Diagnosis:  Extensive stage small cell lung cancer --TMB=23  Current Therapy:   Status post cycle 1 of carboplatinum/etoposide  + XRT s/p cycle #4 -- start on 12/04/2023 Tecentriq  - maintenance s/p cycle #2 -- start on 03/16/2024 PCI -start on 04/10/2024 -completed on 04/27/2024     Interim History:  Tammy Cordova is back for follow-up.  She is doing quite well.  She feels okay.  She does have some equilibrium issues.  This might be from the radiotherapy that recent she received for the PCI.Tammy Cordova  She had an MRI of the brain on 05/22/2024.  The MRI of the brain did not show any evidence of metastatic disease.  Her last PET scan that was done on 05/04/2024 did not show any evidence of obvious metastatic disease or recurrence.  I really think she is done quite nicely.  She still has not yet seen Gastroenterology.  She still has problems with the reflux.  She had a barium swallow which did show some narrowing.  This really needs to be seen by gastroenterology.  She needs to have an upper endoscopy and possible dilation.  She has had no fever.  There has been no bleeding.  She has had no change in bowel or bladder habits.  Of note, her last TSH back in May was 0.88.  Overall, I would say that her performance status is probably ECOG 1.   Medications:  Current Outpatient Medications:    albuterol  (VENTOLIN  HFA) 108 (90 Base) MCG/ACT inhaler, Inhale 2 puffs into the lungs every 6 (six) hours as needed for wheezing or shortness of breath., Disp: , Rfl:    ALPRAZolam  (XANAX ) 0.5 MG tablet, Take 1 tablet (0.5 mg total) by mouth daily. Please take half hour before radiation therapy., Disp: 21 tablet, Rfl: 0   cephALEXin  (KEFLEX ) 500 MG capsule, Take 1 capsule (500 mg total) by mouth 4 (four) times daily., Disp: 28 capsule, Rfl: 0   dexamethasone  (DECADRON ) 4 MG tablet,  Take 2 tablets (8 mg total) by mouth daily. (Patient taking differently: Take 8 mg by mouth daily as needed.), Disp: 60 tablet, Rfl: 4   folic acid  (FOLVITE ) 1 MG tablet, Take 1 tablet (1 mg total) by mouth daily., Disp: 30 tablet, Rfl: 3   HYDROcodone -acetaminophen  (NORCO/VICODIN) 5-325 MG tablet, Take 1 tablet by mouth every 6 (six) hours as needed for moderate pain (pain score 4-6)., Disp: 20 tablet, Rfl: 0   levofloxacin  (LEVAQUIN ) 500 MG tablet, Take 500 mg by mouth daily., Disp: , Rfl:    lidocaine  (XYLOCAINE ) 2 % solution, Use as directed 15 mLs in the mouth or throat every 6 (six) hours as needed for mouth pain. Please take 15 cc (1 tablespoon) half hour before you try to eat., Disp: 300 mL, Rfl: 4   memantine  (NAMENDA ) 10 MG tablet, Take 1 tablet (10 mg total) by mouth 2 (two) times daily., Disp: 60 tablet, Rfl: 4   memantine  (NAMENDA ) 5 MG tablet, Begin this prescription the first day of brain radiation. Week 1: take one tablet po qam. Week 2: take one tablet qam and qpm. Week 3: take two tablets qam, and one tablet po q pm. Week 4: take two tablets qam and qpm. Fill subsequent prescription q month., Disp: 70 tablet, Rfl: 0   ondansetron  (ZOFRAN ) 8 MG tablet, Take 1 tablet (8 mg total) by mouth every 8 (eight)  hours as needed for nausea or vomiting. Start on third day after Carboplatin , Disp: 30 tablet, Rfl: 1   pantoprazole  (PROTONIX ) 40 MG tablet, Take 1 tablet (40 mg total) by mouth 2 (two) times daily., Disp: 60 tablet, Rfl: 5   prochlorperazine  (COMPAZINE ) 10 MG tablet, Take 1 tablet (10 mg total) by mouth every 6 (six) hours as needed for nausea or vomiting., Disp: 30 tablet, Rfl: 1   sucralfate  (CARAFATE ) 1 GM/10ML suspension, Take 10 mLs (1 g total) by mouth 4 (four) times daily -  with meals and at bedtime., Disp: 420 mL, Rfl: 4   SYMBICORT  160-4.5 MCG/ACT inhaler, Inhale 2 puffs into the lungs 4 (four) times daily as needed., Disp: , Rfl:    Vitamin D , Ergocalciferol , (DRISDOL ) 1.25  MG (50000 UNIT) CAPS capsule, Take 1 capsule by mouth once a week, Disp: 4 capsule, Rfl: 0   dronabinol  ([name] ) 2.5 MG capsule, Take 1 capsule (2.5 mg total) by mouth 2 (two) times daily before lunch and supper. (Patient not taking: Reported on 05/24/2024), Disp: 60 capsule, Rfl: 0 No current facility-administered medications for this visit.  Facility-Administered Medications Ordered in Other Visits:    0.9 %  sodium chloride  infusion, , Intravenous, Continuous, Kennis Buell, Maude SAUNDERS, MD, Stopped at 05/24/24 1222   sodium chloride  flush (NS) 0.9 % injection 10 mL, 10 mL, Intracatheter, PRN, Bellamy Rubey R, MD, 10 mL at 05/24/24 1224  Allergies: No Known Allergies  Past Medical History, Surgical history, Social history, and Family History were reviewed and updated.  Review of Systems: Review of Systems  Constitutional:  Positive for appetite change and unexpected weight change.  HENT:   Positive for sore throat.   Eyes: Negative.   Respiratory: Negative.    Cardiovascular: Negative.   Gastrointestinal:  Positive for nausea.  Endocrine: Negative.   Genitourinary: Negative.    Musculoskeletal: Negative.   Skin: Negative.   Neurological:  Positive for dizziness.  Hematological: Negative.   Psychiatric/Behavioral: Negative.      Physical Exam: Vital signs show temperature of 98.2.  Pulse 72.  Blood pressure 136/69.  Weight is 154 pounds.    Wt Readings from Last 3 Encounters:  05/24/24 154 lb 1.3 oz (69.9 kg)  05/03/24 146 lb (66.2 kg)  04/05/24 143 lb 1.9 oz (64.9 kg)    Physical Exam Vitals reviewed.  HENT:     Head: Normocephalic and atraumatic.   Eyes:     Pupils: Pupils are equal, round, and reactive to light.    Cardiovascular:     Rate and Rhythm: Normal rate and regular rhythm.     Heart sounds: Normal heart sounds.  Pulmonary:     Effort: Pulmonary effort is normal.     Breath sounds: Normal breath sounds.  Abdominal:     General: Bowel sounds are normal.      Palpations: Abdomen is soft.   Musculoskeletal:        General: No tenderness or deformity. Normal range of motion.     Cervical back: Normal range of motion.  Lymphadenopathy:     Cervical: No cervical adenopathy.   Skin:    General: Skin is warm and dry.     Findings: No erythema or rash.   Neurological:     Mental Status: She is alert and oriented to person, place, and time.   Psychiatric:        Behavior: Behavior normal.        Thought Content: Thought content normal.  Judgment: Judgment normal.      Lab Results  Component Value Date   WBC 3.1 (L) 05/24/2024   HGB 10.9 (L) 05/24/2024   HCT 35.5 (L) 05/24/2024   MCV 90.6 05/24/2024   PLT 207 05/24/2024     Chemistry      Component Value Date/Time   NA 143 05/24/2024 0920   K 4.0 05/24/2024 0920   CL 109 05/24/2024 0920   CO2 29 05/24/2024 0920   BUN 8 05/24/2024 0920   CREATININE 0.69 05/24/2024 0920      Component Value Date/Time   CALCIUM  8.9 05/24/2024 0920   ALKPHOS 63 05/24/2024 0920   AST 9 (L) 05/24/2024 0920   ALT <5 05/24/2024 0920   BILITOT 0.3 05/24/2024 0920       Impression and Plan: Tammy Cordova is a very charming 58 year old Afro-American female with extensive stage small cell lung cancer.  Because of the bulk of her mediastinal disease, we went ahead and gave radiation with chemotherapy.  Again, she had a very nice response by the PET scan.  She had PCI.  I think this has certainly helped her out.  She currently is on maintenance Tecentriq .  We will go ahead with her fourth cycle today.  I am hoping that Gastroenterology will be able to get her in quickly so they can do an esophageal dilation to help her with the dysphagia.  As always, we will plan to get her back to see us  in another 3 weeks.   Maude JONELLE Crease, MD 6/25/20254:54 PM

## 2024-05-25 ENCOUNTER — Other Ambulatory Visit (HOSPITAL_COMMUNITY)

## 2024-05-25 ENCOUNTER — Encounter (HOSPITAL_COMMUNITY): Payer: Self-pay | Admitting: Hematology & Oncology

## 2024-05-25 ENCOUNTER — Other Ambulatory Visit

## 2024-05-25 ENCOUNTER — Telehealth: Payer: Self-pay

## 2024-05-25 ENCOUNTER — Encounter

## 2024-05-25 NOTE — Progress Notes (Signed)
  Radiation Oncology         [phone]) [phone] ________________________________  Name: Tammy Cordova MRN: [id number]  Date of Service: 05/29/2024  DOB: 09-28-1966  Post Treatment Telephone Note  Diagnosis:  Extensive Stage Small Cell Carcinoma of the RUL (as documented in provider EOT note)   The patient was not available for call today.  Unable to reach patient for today's appointment. 2 calls. No answer. Message was left w/ my extension [phone]. Call complete.  The patient is taking dexamethasone .  The patient was counseled that she will be contacted by our brain and spine navigator to schedule surveillance imaging. The patient was encouraged to call if she have not received a call to schedule imaging, or if shedevelops concerns or questions regarding radiation. The patient will also continue to follow up with Dr. Timmy in medical oncology.  This concludes the interaction.  Rosaline Minerva, LPN

## 2024-05-25 NOTE — Telephone Encounter (Signed)
-----   Message from Sandor LULLA Flatter sent at 05/24/2024  5:05 PM EDT ----- Need to try to get this patient in ASAP. My only option is to double overbook my AM on 7/3. Unless you see a new opening with another doc before then? Hasn't been seen in the office at all, so really should have an OV. Let me know what you see on the schedule. Thanks! ----- Message ----- From: Timmy Maude SAUNDERS, MD Sent: 05/24/2024   4:57 PM EDT To: Sandor Flatter LULLA, DO  Vito: My patient needs to have a upper endoscopy and possible esophageal dilation quickly.  She had a barium swallow which showed esophageal narrowing.  This is really causing her problems.  If you could get her in and try to do an upper endoscopy in a couple weeks that would be fantastic.  I know a holiday week is coming up.  I know I am sure that you will be quite busy with your family.  Thank so much for trying to help Ms. Boutelle out.  Jeralyn

## 2024-05-25 NOTE — Telephone Encounter (Signed)
 Left message on voicemail for patient to return call to schedule an office visit with Dr San.  Will continue efforts.

## 2024-05-26 NOTE — Telephone Encounter (Signed)
 Spoke with patient and she is aware that she is scheduled with Dr San on 06-01-24 at 10am.  Patient advised to arrive at 9:45 to complete any paper work that needs to be filled out.  Patient agreed to plan and verbalized understanding.  No further questions or concerns.

## 2024-05-29 ENCOUNTER — Ambulatory Visit
Admission: RE | Admit: 2024-05-29 | Discharge: 2024-05-29 | Disposition: A | Discharge status: Home / Self Care | Source: Ambulatory Visit | Attending: Internal Medicine | Admitting: Internal Medicine

## 2024-06-01 ENCOUNTER — Encounter: Payer: Self-pay | Admitting: Gastroenterology

## 2024-06-01 ENCOUNTER — Ambulatory Visit: Admitting: Gastroenterology

## 2024-06-01 VITALS — BP 144/90 | HR 88 | Ht 64.0 in | Wt 149.2 lb

## 2024-06-01 DIAGNOSIS — R1310 Dysphagia, unspecified: Secondary | ICD-10-CM

## 2024-06-01 DIAGNOSIS — K219 Gastro-esophageal reflux disease without esophagitis: Secondary | ICD-10-CM

## 2024-06-01 DIAGNOSIS — C3490 Malignant neoplasm of unspecified part of unspecified bronchus or lung: Secondary | ICD-10-CM | POA: Diagnosis not present

## 2024-06-01 DIAGNOSIS — R933 Abnormal findings on diagnostic imaging of other parts of digestive tract: Secondary | ICD-10-CM | POA: Diagnosis not present

## 2024-06-01 DIAGNOSIS — Z923 Personal history of irradiation: Secondary | ICD-10-CM

## 2024-06-01 DIAGNOSIS — R1319 Other dysphagia: Secondary | ICD-10-CM

## 2024-06-01 NOTE — Patient Instructions (Addendum)
 _______________________________________________________  If your blood pressure at your visit was 140/90 or greater, please contact your primary care physician to follow up on this.  _______________________________________________________  If you are age 57 or older, your body mass index should be between 23-30. Your Body mass index is 25.62 kg/m. If this is out of the aforementioned range listed, please consider follow up with your Primary Care Provider.  If you are age 28 or younger, your body mass index should be between 19-25. Your Body mass index is 25.62 kg/m. If this is out of the aformentioned range listed, please consider follow up with your Primary Care Provider.   ________________________________________________________  The [facility] providers would like to encourage you to use MYCHART to communicate with providers for non-urgent requests or questions.  Due to long hold times on the telephone, sending your provider a message by [facility] may be a faster and more efficient way to get a response.  Please allow 48 business hours for a response.  Please remember that this is for non-urgent requests.  _______________________________________________________ We have sent the following medications to your pharmacy for you to pick up at your convenience:  INCREASE: pantoprazole  to 40mg  one tablet two times daily.  You have been scheduled for an endoscopy. Please follow written instructions given to you at your visit today.  If you use inhalers (even only as needed), please bring them with you on the day of your procedure.  If you take any of the following medications, they will need to be adjusted prior to your procedure:   DO NOT TAKE 7 DAYS PRIOR TO TEST- Trulicity (dulaglutide) Ozempic, Wegovy (semaglutide) Mounjaro (tirzepatide) Bydureon Bcise (exanatide extended release)  DO NOT TAKE 1 DAY PRIOR TO YOUR TEST Rybelsus (semaglutide) Adlyxin (lixisenatide) Victoza  (liraglutide) Byetta (exanatide) ___________________________________________________________________________  Due to recent changes in healthcare laws, you may see the results of your imaging and laboratory studies on MyChart before your provider has had a chance to review them.  We understand that in some cases there may be results that are confusing or concerning to you. Not all laboratory results come back in the same time frame and the provider may be waiting for multiple results in order to interpret others.  Please give us  48 hours in order for your provider to thoroughly review all the results before contacting the office for clarification of your results.   It was a pleasure to see you today!  Tammy Cordova, D.O.

## 2024-06-01 NOTE — Progress Notes (Signed)
 Chief Complaint: Dysphagia, GERD   Referring Provider: Maude Crease, MD    HPI:     Tammy Cordova is a 58 y.o. female with a history of small cell lung cancer s/p chemotherapy plus XRT in 12/2023 now on maintenance Tecentriq , COPD, GERD, rheumatoid arthritis, referred to the Gastroenterology Clinic for evaluation of dysphagia and GERD.  She developed dysphagia starting in February after completion of her radiation.  No prior history of dysphagia or odynophagia.  Will have dysphagia to pills, pointing to anterior neck/suprasternal notch.  No issue tolerating liquids.    Has had GERD for 2-3 years with index symptoms of heartburn and regurgitation. Trialed OTC antacids, then prescribed pantoprazole  40 mg daily by PCP with overall improvement.  Currently taking Protonix  40 mg daily.  No previous EGD.  Last radiation session to the neck/chest was 12/23/2023.  Finished brain radiation on 04/27/2024.  No lower GI sxs. Has had several negative Cologuards in the past, but not prior colonoscopy.   Barium esophagram on 05/15/2024 with normal motility, persistent coating of barium in the cervical esophagus with questionable diverticulum, tiny hiatal hernia, 13 mm barium tablet would not pass beyond distal esophagus at the GE junction with slight narrowing.   PET/CT on 6/5 without abnormal uptake in the GI tract.  Further reduction in size and uptake of right apical mass lesion, prominent iliac lymph nodes similar to prior.  MRI brain on 6/23 without intracranial metastatic lesions.  Reviewed most recent labs from 05/24/2024 with essentially stable CBC and normal CMP as below.     Latest Ref Rng & Units 05/24/2024    9:20 AM 05/03/2024    9:35 AM 04/05/2024    9:15 AM  CBC  WBC 4.0 - 10.5 K/uL 3.1  3.9  7.0   Hemoglobin 12.0 - 15.0 g/dL 89.0  89.1  9.2   Hematocrit 36.0 - 46.0 % 35.5  34.5  29.7   Platelets 150 - 400 K/uL 207  196  283       Latest Ref Rng & Units 05/24/2024     9:20 AM 05/03/2024    9:35 AM 04/05/2024    9:15 AM  CMP  Glucose 70 - 99 mg/dL 892  892  874   BUN 6 - 20 mg/dL 8  11  8    Creatinine 0.44 - 1.00 mg/dL 9.30  9.21  9.28   Sodium 135 - 145 mmol/L 143  142  139   Potassium 3.5 - 5.1 mmol/L 4.0  3.6  3.4   Chloride 98 - 111 mmol/L 109  108  105   CO2 22 - 32 mmol/L 29  27  26    Calcium  8.9 - 10.3 mg/dL 8.9  8.7  8.6   Total Protein 6.5 - 8.1 g/dL 6.9  7.7  8.1   Total Bilirubin 0.0 - 1.2 mg/dL 0.3  0.4  0.2   Alkaline Phos 38 - 126 U/L 63  66  61   AST 15 - 41 U/L 9  9  7    ALT 0 - 44 U/L <5  <5  <5       Past Medical History:  Diagnosis Date   Asthma    COPD (chronic obstructive pulmonary disease) (HCC)    GERD (gastroesophageal reflux disease)    Lung cancer (HCC)    Pneumonia    RA (rheumatoid arthritis) (HCC)    Vitamin D  deficiency  Past Surgical History:  Procedure Laterality Date   BREAST BIOPSY Left 04/2016   REACTIVE LYMPHOID HYPERPLASIA    BUNIONECTOMY Bilateral    bilateral   IR IMAGING GUIDED PORT INSERTION  12/03/2023   Family History  Problem Relation Age of Onset   Diabetes Mother    Hypertension Mother    Diabetes Father    Hypertension Father    Prostate cancer Father    Hypertension Brother    Diabetes Brother    Stroke Brother    Breast cancer Paternal Grandmother 10   Cancer Paternal Grandfather        type unknown   Social History   Tobacco Use   Smoking status: Former    Current packs/day: 0.00    Average packs/day: 0.3 packs/day for 36.0 years (10.8 ttl pk-yrs)    Types: Cigarettes    Quit date: 11/23/2023    Years since quitting: 0.5   Smokeless tobacco: Never   Tobacco comments:    Quit smoking the Saturday before Christmas 2024  Vaping Use   Vaping status: Never Used  Substance Use Topics   Alcohol use: No   Drug use: Yes    Types: Marijuana   Current Outpatient Medications  Medication Sig Dispense Refill   albuterol  (VENTOLIN  HFA) 108 (90 Base) MCG/ACT inhaler Inhale  2 puffs into the lungs every 6 (six) hours as needed for wheezing or shortness of breath.     ALPRAZolam  (XANAX ) 0.5 MG tablet Take 1 tablet (0.5 mg total) by mouth daily. Please take half hour before radiation therapy. 21 tablet 0   cephALEXin  (KEFLEX ) 500 MG capsule Take 1 capsule (500 mg total) by mouth 4 (four) times daily. 28 capsule 0   dexamethasone  (DECADRON ) 4 MG tablet Take 2 tablets (8 mg total) by mouth daily. (Patient taking differently: Take 8 mg by mouth daily as needed.) 60 tablet 4   dronabinol  ([name] ) 2.5 MG capsule Take 1 capsule (2.5 mg total) by mouth 2 (two) times daily before lunch and supper. 60 capsule 0   folic acid  (FOLVITE ) 1 MG tablet Take 1 tablet (1 mg total) by mouth daily. 30 tablet 3   HYDROcodone -acetaminophen  (NORCO/VICODIN) 5-325 MG tablet Take 1 tablet by mouth every 6 (six) hours as needed for moderate pain (pain score 4-6). 20 tablet 0   levofloxacin  (LEVAQUIN ) 500 MG tablet Take 500 mg by mouth daily.     lidocaine  (XYLOCAINE ) 2 % solution Use as directed 15 mLs in the mouth or throat every 6 (six) hours as needed for mouth pain. Please take 15 cc (1 tablespoon) half hour before you try to eat. 300 mL 4   ondansetron  (ZOFRAN ) 8 MG tablet Take 1 tablet (8 mg total) by mouth every 8 (eight) hours as needed for nausea or vomiting. Start on third day after Carboplatin  30 tablet 1   pantoprazole  (PROTONIX ) 40 MG tablet Take 1 tablet (40 mg total) by mouth 2 (two) times daily. 60 tablet 5   prochlorperazine  (COMPAZINE ) 10 MG tablet Take 1 tablet (10 mg total) by mouth every 6 (six) hours as needed for nausea or vomiting. 30 tablet 1   sucralfate  (CARAFATE ) 1 GM/10ML suspension Take 10 mLs (1 g total) by mouth 4 (four) times daily -  with meals and at bedtime. 420 mL 4   SYMBICORT  160-4.5 MCG/ACT inhaler Inhale 2 puffs into the lungs 4 (four) times daily as needed.     Vitamin D , Ergocalciferol , (DRISDOL ) 1.25 MG (50000 UNIT) CAPS capsule Take  1 capsule by mouth once  a week 4 capsule 0   No current facility-administered medications for this visit.   No Known Allergies   Review of Systems: All systems reviewed and negative except where noted in HPI.     Physical Exam:    Wt Readings from Last 3 Encounters:  06/01/24 149 lb 4 oz (67.7 kg)  05/24/24 154 lb 1.3 oz (69.9 kg)  05/03/24 146 lb (66.2 kg)    BP (!) 144/90 (BP Location: Left Arm, Patient Position: Sitting, Cuff Size: Normal)   Pulse 88   Ht 5' 4 (1.626 m) Comment: height measured without shoes  Wt 149 lb 4 oz (67.7 kg)   LMP  (LMP Unknown)   BMI 25.62 kg/m  Constitutional:  Pleasant, in no acute distress. Psychiatric: Normal mood and affect. Behavior is normal. EENT: Pupils normal.  Conjunctivae are normal. No scleral icterus. Neck supple. No cervical LAD. Cardiovascular: Normal rate, regular rhythm. No edema Pulmonary/chest: Effort normal and breath sounds normal. No wheezing, rales or rhonchi. Abdominal: Soft, nondistended, nontender. Bowel sounds active throughout. There are no masses palpable. No hepatomegaly. Neurological: Alert and oriented to person place and time. Skin: Skin is warm and dry. No rashes noted.   ASSESSMENT AND PLAN;   1) Dysphagia 2) History of radiation 3) Abnormal esophagram 45) GERD 58 year old female with prior history of GERD which had been well-controlled with Protonix  40 mg daily, now presents with new onset dysphagia after completion of radiation for right apical small cell lung cancer earlier this year.  Last course of chest radiation was 12/23/2023 and developed progressive dysphagia since then.  Esophagram with persistent coating of barium in the cervical esophagus along with what appears to be a stricture at the GE junction.  Discussed clinical presentation and imaging results with patient at length today with plan for the following:  - Expedited EGD on 06/13/2024 with esophageal dilation and/or biopsies as appropriate - Increase Protonix  to  40 mg twice daily in the meantime - Cut food into small pieces, eat small bites, chew food thoroughly and with plenty of liquids to avoid food impaction.  5) Small cell lung cancer - Follow-up with Dr. Timmy in the Oncology Clinic later this month as scheduled  The indications, risks, and benefits of EGD were explained to the patient in detail. Risks include but are not limited to bleeding, perforation, adverse reaction to medications, and cardiopulmonary compromise. Sequelae include but are not limited to the possibility of surgery, hospitalization, and mortality. The patient verbalized understanding and wished to proceed. All questions answered, referred to scheduler. Further recommendations pending results of the exam.     Sandor LULLA Flatter, DO, FACG  06/01/2024, 10:31 AM   Pahwani, Velna SAUNDERS, MD

## 2024-06-08 ENCOUNTER — Telehealth: Payer: Self-pay | Admitting: Hematology & Oncology

## 2024-06-08 NOTE — Telephone Encounter (Signed)
 Patient called to and requested we push her 7/16 appts out a week. Pt made aware this would delay her treatment. Rescheduled to 7/22 per patient request.

## 2024-06-11 ENCOUNTER — Other Ambulatory Visit: Payer: Self-pay | Admitting: Hematology & Oncology

## 2024-06-12 ENCOUNTER — Encounter (HOSPITAL_COMMUNITY): Payer: Self-pay | Admitting: Hematology & Oncology

## 2024-06-13 ENCOUNTER — Inpatient Hospital Stay: Attending: Hematology & Oncology

## 2024-06-13 ENCOUNTER — Ambulatory Visit: Admitting: Gastroenterology

## 2024-06-13 ENCOUNTER — Encounter: Payer: Self-pay | Admitting: Gastroenterology

## 2024-06-13 VITALS — BP 161/84 | HR 82 | Temp 98.1°F | Resp 19 | Ht 64.0 in | Wt 149.0 lb

## 2024-06-13 DIAGNOSIS — C3490 Malignant neoplasm of unspecified part of unspecified bronchus or lung: Secondary | ICD-10-CM | POA: Insufficient documentation

## 2024-06-13 DIAGNOSIS — R1319 Other dysphagia: Secondary | ICD-10-CM

## 2024-06-13 DIAGNOSIS — Z923 Personal history of irradiation: Secondary | ICD-10-CM | POA: Insufficient documentation

## 2024-06-13 DIAGNOSIS — K2950 Unspecified chronic gastritis without bleeding: Secondary | ICD-10-CM | POA: Diagnosis present

## 2024-06-13 DIAGNOSIS — K219 Gastro-esophageal reflux disease without esophagitis: Secondary | ICD-10-CM

## 2024-06-13 DIAGNOSIS — K222 Esophageal obstruction: Secondary | ICD-10-CM

## 2024-06-13 DIAGNOSIS — K229 Disease of esophagus, unspecified: Secondary | ICD-10-CM | POA: Diagnosis not present

## 2024-06-13 DIAGNOSIS — K3189 Other diseases of stomach and duodenum: Secondary | ICD-10-CM

## 2024-06-13 DIAGNOSIS — Z5112 Encounter for antineoplastic immunotherapy: Secondary | ICD-10-CM | POA: Insufficient documentation

## 2024-06-13 DIAGNOSIS — Z9221 Personal history of antineoplastic chemotherapy: Secondary | ICD-10-CM | POA: Insufficient documentation

## 2024-06-13 DIAGNOSIS — K317 Polyp of stomach and duodenum: Secondary | ICD-10-CM | POA: Diagnosis not present

## 2024-06-13 DIAGNOSIS — Z79899 Other long term (current) drug therapy: Secondary | ICD-10-CM | POA: Insufficient documentation

## 2024-06-13 MED ORDER — SODIUM CHLORIDE 0.9 % IV SOLN: 500.0000 mL | Freq: Once | INTRAVENOUS | Status: DC

## 2024-06-13 NOTE — Patient Instructions (Signed)
-  Handout on post dilation diet provided. -await pathology results. -repeat colonoscopy for surveillance recommended. Date to be determined when pathology result become available.  -Continue present medications.  YOU HAD AN ENDOSCOPIC PROCEDURE TODAY AT THE [facility]:   Refer to the procedure report that was given to you for any specific questions about what was found during the examination.  If the procedure report does not answer your questions, please call your gastroenterologist to clarify.  If you requested that your care partner not be given the details of your procedure findings, then the procedure report has been included in a sealed envelope for you to review at your convenience later.  YOU SHOULD EXPECT: Some feelings of bloating in the abdomen. Passage of more gas than usual.  Walking can help get rid of the air that was put into your GI tract during the procedure and reduce the bloating. If you had a lower endoscopy (such as a colonoscopy or flexible sigmoidoscopy) you may notice spotting of blood in your stool or on the toilet paper. If you underwent a bowel prep for your procedure, you may not have a normal bowel movement for a few days.  Please Note:  You might notice some irritation and congestion in your nose or some drainage.  This is from the oxygen used during your procedure.  There is no need for concern and it should clear up in a day or so.  SYMPTOMS TO REPORT IMMEDIATELY:  Following upper endoscopy (EGD)  Vomiting of blood or coffee ground material  New chest pain or pain under the shoulder blades  Painful or persistently difficult swallowing  New shortness of breath  Fever of 100F or higher  Black, tarry-looking stools  For urgent or emergent issues, a gastroenterologist can be reached at any hour by calling [phone]. Do not use MyChart messaging for urgent concerns.    DIET: Clear liquid diet for one hour, until 9:35 am. Soft diet starting at  9:35 am until tomorrow. See post dilation diet handout for recommendations.  .  Drink plenty of fluids but you should avoid alcoholic beverages for 24 hours   ACTIVITY:  You should plan to take it easy for the rest of today and you should NOT DRIVE or use heavy machinery until tomorrow (because of the sedation medicines used during the test).    FOLLOW UP: Our staff will call the number listed on your records the next business day following your procedure.  We will call around 7:15- 8:00 am to check on you and address any questions or concerns that you may have regarding the information given to you following your procedure. If we do not reach you, we will leave a message.     If any biopsies were taken you will be contacted by phone or by letter within the next 1-3 weeks.  Please call us  at [phone] if you have not heard about the biopsies in 3 weeks.    SIGNATURES/CONFIDENTIALITY: You and/or your care partner have signed paperwork which will be entered into your electronic medical record.  These signatures attest to the fact that that the information above on your After Visit Summary has been reviewed and is understood.  Full responsibility of the confidentiality of this discharge information lies with you and/or your care-partner.

## 2024-06-13 NOTE — Progress Notes (Signed)
 Called to room to assist during endoscopic procedure.  Patient ID and intended procedure confirmed with present staff. Received instructions for my participation in the procedure from the performing physician.

## 2024-06-13 NOTE — Progress Notes (Signed)
 [facility] CSW Progress Note  Clinical Child psychotherapist contacted patient by phone to follow-up on need for community resources.    Interventions: Referred patient to community resources: [employer] and [employer].  CSW emailed patient information.  She stated she would email a copy of her food stamps card so that she could apply for the [employer].  Tammy Cordova said she was going to lose her health insurance beginning on 06/30/24.  She is working on Forensic scientist or Medicaid since she is receiving Social Security Disability.       Follow Up Plan:  Patient will contact CSW with any support or resource needs    Macario [name] Au, LCSW Clinical Social Worker [facility]

## 2024-06-13 NOTE — Op Note (Signed)
 [facility] Patient Name: Tammy Cordova Procedure Date: 06/13/2024 7:00 AM MRN: [id number] Endoscopist: Sandor Flatter , MD, [id number] Age: 58 Referring MD:  Date of Birth: Apr 01, 1966 Gender: Female Account #: [id number] Procedure:                Upper GI endoscopy Indications:              Dysphagia, Esophageal reflux, Abnormal esophagram Medicines:                Monitored Anesthesia Care Procedure:                Pre-Anesthesia Assessment:                           - Prior to the procedure, a History and Physical                            was performed, and patient medications and                            allergies were reviewed. The patient's tolerance of                            previous anesthesia was also reviewed. The risks                            and benefits of the procedure and the sedation                            options and risks were discussed with the patient.                            All questions were answered, and informed consent                            was obtained. Prior Anticoagulants: The patient has                            taken no anticoagulant or antiplatelet agents. ASA                            Grade Assessment: II - A patient with mild systemic                            disease. After reviewing the risks and benefits,                            the patient was deemed in satisfactory condition to                            undergo the procedure.                           After obtaining informed consent, the endoscope was  passed under direct vision. Throughout the                            procedure, the patient's blood pressure, pulse, and                            oxygen saturations were monitored continuously. The                            GIF [id number] #[id number] was introduced through the                            mouth, and advanced to the second part of duodenum.                            The  upper GI endoscopy was accomplished without                            difficulty. The patient tolerated the procedure                            well. Scope In: Scope Out: Findings:                 Two benign-appearing, intrinsic moderate stenoses                            were found 35 to 36 cm from the incisors. The                            stenoses were traversed. A TTS dilator was passed                            through the scope. Dilation with a 15-16.5-18 mm                            balloon dilator was performed to 18 mm. The                            dilation site was examined and showed mild mucosal                            disruption and moderate improvement in luminal                            narrowing. Biopsies were taken with a cold forceps                            for histology and for further fracturing of the                            stricture. Estimated blood loss was minimal.  One moderate radiation stenosis was found 17 to 25                            cm from the incisors. This stenosis measured 1.2 cm                            (inner diameter) x 8 cm (in length). The stenosis                            was traversed. A TTS dilator was passed through the                            scope. Dilation with a 15-16.5-18 mm balloon                            dilator was performed to 15 mm. The dilation site                            was examined and showed mild mucosal disruption and                            moderate improvement in luminal narrowing. Biopsies                            were taken with a cold forceps for histology.                            Estimated blood loss was minimal.                           The Z-line was regular and was found 40 cm from the                            incisors.                           A single 5 mm submucosal nodule with no bleeding                            was found in the gastric  fundus. The overlying                            mucosa was normal appearing. The nodule was firm                            and semi-mobile when manipulated with the closed                            forceps. Tunnelled bite-on-bite biopsies were taken                            with a cold forceps for histology. Estimated blood  loss was minimal.                           Normal mucosa was found in the remainder of the                            stomach.                           The examined duodenum was normal. Complications:            No immediate complications. Estimated Blood Loss:     Estimated blood loss was minimal. Impression:               - Benign-appearing esophageal stenoses. Dilated                            with 18 mm TTS balloon with appropriate mucosal                            disruption. This was then biopsied for histology                            and for further fracturing of the rings.                           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                            Dilated with 15 mm TTS balloon with several                            appropriate mucosal disruptions. Biopsied.                           - Z-line regular, 40 cm from the incisors.                           - A single submucosal papule (nodule) found in the                            stomach. Biopsied.                           - Normal mucosa was found in the entire stomach.                           - Normal examined duodenum. Recommendation:           - Patient has a contact number available for                            emergencies. The signs and symptoms of potential                            delayed complications were discussed with the  patient. Return to normal activities tomorrow.                            Written discharge instructions were provided to the                            patient.                            - Soft diet today, then advance as tolerated                            tomorrow per post dilation protocol.                           - Continue present medications.                           - Await pathology results.                           - Repeat upper endoscopy PRN for retreatment.                           - Return to GI clinic in 2 months or sooner prn. Sandor Flatter, MD 06/13/2024 8:42:43 AM

## 2024-06-13 NOTE — Progress Notes (Signed)
To Pacu, VSS. Report to [email or web address] 

## 2024-06-13 NOTE — Progress Notes (Signed)
 GASTROENTEROLOGY PROCEDURE H&P NOTE   Primary Care Physician: Vernon Velna SAUNDERS, MD    Reason for Procedure:   GERD, dysphagia, abnormal esophagram  Plan:    EGD  Patient is appropriate for endoscopic procedure(s) in the ambulatory ([facility]) setting.  The nature of the procedure, as well as the risks, benefits, and alternatives were carefully and thoroughly reviewed with the patient. Ample time for discussion and questions allowed. The patient understood, was satisfied, and agreed to proceed.     HPI: Tammy Cordova is a 58 y.o. female who presents for EGD for evaluation of new onset dysphagia. Does have a hx of GERD for 2-3 years, currently treated with pantoprazole . Recent abnormal esophagram as below. Otherwise, no significant changes in clinical hx since OV on 06/01/2024 with me.   Barium esophagram on 05/15/2024 with normal motility, persistent coating of barium in the cervical esophagus with questionable diverticulum, tiny hiatal hernia, 13 mm barium tablet would not pass beyond distal esophagus at the GE junction with slight narrowing.     PET/CT on 6/5 without abnormal uptake in the GI tract.  Further reduction in size and uptake of right apical mass lesion, prominent iliac lymph nodes similar to prior.  MRI brain on 6/23 without intracranial metastatic lesions.  Past Medical History:  Diagnosis Date   Asthma    COPD (chronic obstructive pulmonary disease) (HCC)    GERD (gastroesophageal reflux disease)    Lung cancer (HCC)    Pneumonia    RA (rheumatoid arthritis) (HCC)    Vitamin D  deficiency     Past Surgical History:  Procedure Laterality Date   BREAST BIOPSY Left 04/2016   REACTIVE LYMPHOID HYPERPLASIA    BUNIONECTOMY Bilateral    bilateral   IR IMAGING GUIDED PORT INSERTION  12/03/2023    Prior to Admission medications   Medication Sig Start Date End Date Taking? Authorizing Provider  albuterol  (VENTOLIN  HFA) 108 (90 Base) MCG/ACT inhaler Inhale 2 puffs into  the lungs every 6 (six) hours as needed for wheezing or shortness of breath.   Yes [provider]  dexamethasone  (DECADRON ) 4 MG tablet Take 2 tablets (8 mg total) by mouth daily. Patient taking differently: Take 8 mg by mouth daily as needed. 04/05/24  Yes Timmy Maude SAUNDERS, MD  folic acid  (FOLVITE ) 1 MG tablet Take 1 tablet (1 mg total) by mouth daily. 05/24/24  Yes Timmy Maude SAUNDERS, MD  levofloxacin  (LEVAQUIN ) 500 MG tablet Take 500 mg by mouth daily. 05/15/24  Yes [provider]  pantoprazole  (PROTONIX ) 40 MG tablet Take 1 tablet (40 mg total) by mouth 2 (two) times daily. 05/03/24  Yes Timmy Maude SAUNDERS, MD  sucralfate  (CARAFATE ) 1 GM/10ML suspension Take 10 mLs (1 g total) by mouth 4 (four) times daily -  with meals and at bedtime. 05/03/24  Yes Timmy Maude SAUNDERS, MD  SYMBICORT  160-4.5 MCG/ACT inhaler Inhale 2 puffs into the lungs 4 (four) times daily as needed. 04/17/24  Yes [provider]  Vitamin D , Ergocalciferol , (DRISDOL ) 1.25 MG (50000 UNIT) CAPS capsule Take 1 capsule by mouth once a week 06/12/24  Yes Ennever, Maude SAUNDERS, MD  ALPRAZolam  (XANAX ) 0.5 MG tablet Take 1 tablet (0.5 mg total) by mouth daily. Please take half hour before radiation therapy. 04/05/24   Timmy Maude SAUNDERS, MD  cephALEXin  (KEFLEX ) 500 MG capsule Take 1 capsule (500 mg total) by mouth 4 (four) times daily. 05/03/24   Timmy Maude SAUNDERS, MD  dronabinol  ([name] ) 2.5 MG capsule Take  1 capsule (2.5 mg total) by mouth 2 (two) times daily before lunch and supper. 04/05/24   Timmy Maude SAUNDERS, MD  HYDROcodone -acetaminophen  (NORCO/VICODIN) 5-325 MG tablet Take 1 tablet by mouth every 6 (six) hours as needed for moderate pain (pain score 4-6). 12/24/23   Lue Elsie BROCKS, MD  lidocaine  (XYLOCAINE ) 2 % solution Use as directed 15 mLs in the mouth or throat every 6 (six) hours as needed for mouth pain. Please take 15 cc (1 tablespoon) half hour before you try to eat. 12/29/23   Timmy Maude SAUNDERS, MD  ondansetron  (ZOFRAN )  8 MG tablet Take 1 tablet (8 mg total) by mouth every 8 (eight) hours as needed for nausea or vomiting. Start on third day after Carboplatin  12/29/23   Ennever, Maude SAUNDERS, MD  prochlorperazine  (COMPAZINE ) 10 MG tablet Take 1 tablet (10 mg total) by mouth every 6 (six) hours as needed for nausea or vomiting. 12/29/23   Timmy Maude SAUNDERS, MD    Current Outpatient Medications  Medication Sig Dispense Refill   albuterol  (VENTOLIN  HFA) 108 (90 Base) MCG/ACT inhaler Inhale 2 puffs into the lungs every 6 (six) hours as needed for wheezing or shortness of breath.     dexamethasone  (DECADRON ) 4 MG tablet Take 2 tablets (8 mg total) by mouth daily. (Patient taking differently: Take 8 mg by mouth daily as needed.) 60 tablet 4   folic acid  (FOLVITE ) 1 MG tablet Take 1 tablet (1 mg total) by mouth daily. 30 tablet 3   levofloxacin  (LEVAQUIN ) 500 MG tablet Take 500 mg by mouth daily.     pantoprazole  (PROTONIX ) 40 MG tablet Take 1 tablet (40 mg total) by mouth 2 (two) times daily. 60 tablet 5   sucralfate  (CARAFATE ) 1 GM/10ML suspension Take 10 mLs (1 g total) by mouth 4 (four) times daily -  with meals and at bedtime. 420 mL 4   SYMBICORT  160-4.5 MCG/ACT inhaler Inhale 2 puffs into the lungs 4 (four) times daily as needed.     Vitamin D , Ergocalciferol , (DRISDOL ) 1.25 MG (50000 UNIT) CAPS capsule Take 1 capsule by mouth once a week 4 capsule 0   ALPRAZolam  (XANAX ) 0.5 MG tablet Take 1 tablet (0.5 mg total) by mouth daily. Please take half hour before radiation therapy. 21 tablet 0   cephALEXin  (KEFLEX ) 500 MG capsule Take 1 capsule (500 mg total) by mouth 4 (four) times daily. 28 capsule 0   dronabinol  ([name] ) 2.5 MG capsule Take 1 capsule (2.5 mg total) by mouth 2 (two) times daily before lunch and supper. 60 capsule 0   HYDROcodone -acetaminophen  (NORCO/VICODIN) 5-325 MG tablet Take 1 tablet by mouth every 6 (six) hours as needed for moderate pain (pain score 4-6). 20 tablet 0   lidocaine  (XYLOCAINE ) 2 % solution  Use as directed 15 mLs in the mouth or throat every 6 (six) hours as needed for mouth pain. Please take 15 cc (1 tablespoon) half hour before you try to eat. 300 mL 4   ondansetron  (ZOFRAN ) 8 MG tablet Take 1 tablet (8 mg total) by mouth every 8 (eight) hours as needed for nausea or vomiting. Start on third day after Carboplatin  30 tablet 1   prochlorperazine  (COMPAZINE ) 10 MG tablet Take 1 tablet (10 mg total) by mouth every 6 (six) hours as needed for nausea or vomiting. 30 tablet 1   Current Facility-Administered Medications  Medication Dose Route Frequency Provider Last Rate Last Admin   0.9 %  sodium chloride  infusion  500 mL Intravenous Once  Jossette Zirbel V, DO        Allergies as of 06/13/2024   (No Known Allergies)    Family History  Problem Relation Age of Onset   Diabetes Mother    Hypertension Mother    Diabetes Father    Hypertension Father    Prostate cancer Father    Hypertension Brother    Diabetes Brother    Stroke Brother    Breast cancer Paternal Grandmother 47   Cancer Paternal Grandfather        type unknown    Social History   Socioeconomic History   Marital status: Married    Spouse name: Not on file   Number of children: 0   Years of education: Not on file   Highest education level: Not on file  Occupational History   Not on file  Tobacco Use   Smoking status: Former    Current packs/day: 0.00    Average packs/day: 0.3 packs/day for 36.0 years (10.8 ttl pk-yrs)    Types: Cigarettes    Quit date: 11/23/2023    Years since quitting: 0.5   Smokeless tobacco: Never   Tobacco comments:    Quit smoking the Saturday before Christmas 2024  Vaping Use   Vaping status: Never Used  Substance and Sexual Activity   Alcohol use: No   Drug use: Yes    Types: Marijuana   Sexual activity: Not on file  Other Topics Concern   Not on file  Social History Narrative   Not on file   Social Drivers of Health   Financial Resource Strain: Medium Risk  (02/04/2024)   Overall Financial Resource Strain (CARDIA)    Difficulty of Paying Living Expenses: Somewhat hard  Food Insecurity: Patient Declined (12/17/2023)   Hunger Vital Sign    Worried About Running Out of Food in the Last Year: Patient declined    Ran Out of Food in the Last Year: Patient declined  Recent Concern: Food Insecurity - Food Insecurity Present (12/03/2023)   Hunger Vital Sign    Worried About Running Out of Food in the Last Year: Sometimes true    Ran Out of Food in the Last Year: Sometimes true  Transportation Needs: No Transportation Needs (12/17/2023)   PRAPARE - Administrator, Civil Service (Medical): No    Lack of Transportation (Non-Medical): No  Physical Activity: Not on file  Stress: Not on file  Social Connections: Socially Integrated (12/03/2023)   Social Connection and Isolation Panel    Frequency of Communication with Friends and Family: Once a week    Frequency of Social Gatherings with Friends and Family: Twice a week    Attends Religious Services: 1 to 4 times per year    Active Member of [employer] or Organizations: Yes    Attends Banker Meetings: 1 to 4 times per year    Marital Status: Married  Catering manager Violence: Not At Risk (12/17/2023)   Humiliation, Afraid, Rape, and Kick questionnaire    Fear of Current or Ex-Partner: No    Emotionally Abused: No    Physically Abused: No    Sexually Abused: No    Physical Exam: Vital signs in last 24 hours: @BP  (!) 156/73   Pulse 78   Temp 98.1 F (36.7 C) (Temporal)   Ht 5' 4 (1.626 m)   Wt 149 lb (67.6 kg)   LMP  (LMP Unknown)   SpO2 97%   BMI 25.58 kg/m  GEN: NAD EYE:  Sclerae anicteric ENT: MMM CV: Non-tachycardic Pulm: CTA b/l GI: Soft, NT/ND NEURO:  Alert & Oriented x 3   Sandor Flatter, DO [facility]   06/13/2024 7:59 AM

## 2024-06-14 ENCOUNTER — Telehealth: Payer: Self-pay | Admitting: *Deleted

## 2024-06-14 ENCOUNTER — Other Ambulatory Visit

## 2024-06-14 ENCOUNTER — Other Ambulatory Visit: Payer: Self-pay

## 2024-06-14 ENCOUNTER — Ambulatory Visit

## 2024-06-14 ENCOUNTER — Ambulatory Visit: Admitting: Hematology & Oncology

## 2024-06-14 NOTE — Telephone Encounter (Signed)
 No answer on  follow up call. Left message.

## 2024-06-15 ENCOUNTER — Ambulatory Visit: Payer: Self-pay | Admitting: Gastroenterology

## 2024-06-15 LAB — SURGICAL PATHOLOGY

## 2024-06-20 ENCOUNTER — Encounter: Payer: Self-pay | Admitting: *Deleted

## 2024-06-20 ENCOUNTER — Inpatient Hospital Stay

## 2024-06-20 ENCOUNTER — Inpatient Hospital Stay (HOSPITAL_BASED_OUTPATIENT_CLINIC_OR_DEPARTMENT_OTHER): Admitting: Hematology & Oncology

## 2024-06-20 ENCOUNTER — Other Ambulatory Visit: Payer: Self-pay

## 2024-06-20 ENCOUNTER — Encounter: Payer: Self-pay | Admitting: Hematology & Oncology

## 2024-06-20 VITALS — BP 152/77 | HR 65 | Temp 98.5°F | Resp 17 | Ht 64.0 in | Wt 154.0 lb

## 2024-06-20 DIAGNOSIS — Z5112 Encounter for antineoplastic immunotherapy: Secondary | ICD-10-CM | POA: Diagnosis present

## 2024-06-20 DIAGNOSIS — Z79899 Other long term (current) drug therapy: Secondary | ICD-10-CM | POA: Diagnosis not present

## 2024-06-20 DIAGNOSIS — C3411 Malignant neoplasm of upper lobe, right bronchus or lung: Secondary | ICD-10-CM

## 2024-06-20 DIAGNOSIS — Z923 Personal history of irradiation: Secondary | ICD-10-CM | POA: Diagnosis not present

## 2024-06-20 DIAGNOSIS — Z9221 Personal history of antineoplastic chemotherapy: Secondary | ICD-10-CM | POA: Diagnosis not present

## 2024-06-20 DIAGNOSIS — C3490 Malignant neoplasm of unspecified part of unspecified bronchus or lung: Secondary | ICD-10-CM | POA: Diagnosis present

## 2024-06-20 LAB — CBC WITH DIFFERENTIAL (CANCER CENTER ONLY)
Abs Immature Granulocytes: 0.01 K/uL (ref 0.00–0.07)
Basophils Absolute: 0 K/uL (ref 0.0–0.1)
Basophils Relative: 0 %
Eosinophils Absolute: 0.2 K/uL (ref 0.0–0.5)
Eosinophils Relative: 5 %
HCT: 35.1 % — ABNORMAL LOW (ref 36.0–46.0)
Hemoglobin: 11.2 g/dL — ABNORMAL LOW (ref 12.0–15.0)
Immature Granulocytes: 0 %
Lymphocytes Relative: 19 %
Lymphs Abs: 0.6 K/uL — ABNORMAL LOW (ref 0.7–4.0)
MCH: 28.8 pg (ref 26.0–34.0)
MCHC: 31.9 g/dL (ref 30.0–36.0)
MCV: 90.2 fL (ref 80.0–100.0)
Monocytes Absolute: 0.1 K/uL (ref 0.1–1.0)
Monocytes Relative: 4 %
Neutro Abs: 2.3 K/uL (ref 1.7–7.7)
Neutrophils Relative %: 72 %
Platelet Count: 217 K/uL (ref 150–400)
RBC: 3.89 MIL/uL (ref 3.87–5.11)
RDW: 17.6 % — ABNORMAL HIGH (ref 11.5–15.5)
Smear Review: NORMAL
WBC Count: 3.3 K/uL — ABNORMAL LOW (ref 4.0–10.5)
nRBC: 0 % (ref 0.0–0.2)

## 2024-06-20 LAB — CMP (CANCER CENTER ONLY)
ALT: <5 U/L (ref 0–44)
AST: 12 U/L — ABNORMAL LOW (ref 15–41)
Albumin: 3.6 g/dL (ref 3.5–5.0)
Alkaline Phosphatase: 70 U/L (ref 38–126)
Anion gap: 8 (ref 5–15)
BUN: 8 mg/dL (ref 6–20)
CO2: 26 mmol/L (ref 22–32)
Calcium: 8.8 mg/dL — ABNORMAL LOW (ref 8.9–10.3)
Chloride: 106 mmol/L (ref 98–111)
Creatinine: 0.63 mg/dL (ref 0.44–1.00)
GFR, Estimated: >60 mL/min (ref >60)
Glucose, Bld: 102 mg/dL — ABNORMAL HIGH (ref 70–99)
Potassium: 3.8 mmol/L (ref 3.5–5.1)
Sodium: 140 mmol/L (ref 135–145)
Total Bilirubin: 0.4 mg/dL (ref 0.0–1.2)
Total Protein: 7.3 g/dL (ref 6.5–8.1)

## 2024-06-20 LAB — LACTATE DEHYDROGENASE: LDH: 159 U/L (ref 98–192)

## 2024-06-20 LAB — TSH: TSH: 1.79 u[IU]/mL (ref 0.350–4.500)

## 2024-06-20 MED ORDER — HEPARIN SOD (PORK) LOCK FLUSH 100 UNIT/ML IV SOLN
500.0000 [IU] | Freq: Once | INTRAVENOUS | Status: AC | PRN
  Administered 2024-06-20: 500 [IU]

## 2024-06-20 MED ORDER — SODIUM CHLORIDE 0.9% FLUSH
10.0000 mL | INTRAVENOUS | Status: DC | PRN
  Administered 2024-06-20 (×2): 10 mL

## 2024-06-20 MED ORDER — SODIUM CHLORIDE 0.9 % IV SOLN
1200.0000 mg | Freq: Once | INTRAVENOUS | Status: AC
  Administered 2024-06-20: 1200 mg via INTRAVENOUS
  Filled 2024-06-20: qty 20

## 2024-06-20 MED ORDER — SODIUM CHLORIDE 0.9 % IV SOLN
INTRAVENOUS | Status: DC
Start: 2024-06-20 — End: 2024-06-20

## 2024-06-20 NOTE — Progress Notes (Signed)
 ScreeningSo 1027 the patient Hematology and Oncology Follow Up Visit  Tammy Cordova [id number] 03-05-1966 58 y.o. 06/20/2024   Principle Diagnosis:  Extensive stage small cell lung cancer --TMB=23  Current Therapy:   Status post cycle 1 of carboplatinum/etoposide  + XRT s/p cycle #4 -- start on 12/04/2023 Tecentriq  - maintenance s/p cycle #3 -- start on 03/16/2024 PCI -start on 04/10/2024 -completed on 04/27/2024     Interim History:  Tammy Cordova is back for follow-up.  She is doing quite well.  She feels well.  She has had no problems with headache.  She has had no problems with nausea or vomiting.  She has had no cough or shortness of breath.  She has had no pain.  There has been no leg swelling.Tammy Cordova  She was seen by Gastroenterology.  She did have esophageal dilation.  This really helped her out.  She is able to eat and swallow regular food now.  She has had no problems with mouth sores.  There has been no visual changes.  Overall, I will say performance status is ECOG 0.    Overall, I would say that her performance status is probably ECOG 1.   Medications:  Current Outpatient Medications:    albuterol  (VENTOLIN  HFA) 108 (90 Base) MCG/ACT inhaler, Inhale 2 puffs into the lungs every 6 (six) hours as needed for wheezing or shortness of breath., Disp: , Rfl:    ALPRAZolam  (XANAX ) 0.5 MG tablet, Take 1 tablet (0.5 mg total) by mouth daily. Please take half hour before radiation therapy., Disp: 21 tablet, Rfl: 0   cephALEXin  (KEFLEX ) 500 MG capsule, Take 1 capsule (500 mg total) by mouth 4 (four) times daily., Disp: 28 capsule, Rfl: 0   dexamethasone  (DECADRON ) 4 MG tablet, Take 2 tablets (8 mg total) by mouth daily. (Patient taking differently: Take 8 mg by mouth daily as needed.), Disp: 60 tablet, Rfl: 4   dronabinol  ([name] ) 2.5 MG capsule, Take 1 capsule (2.5 mg total) by mouth 2 (two) times daily before lunch and supper., Disp: 60 capsule, Rfl: 0   folic acid  (FOLVITE ) 1 MG tablet, Take  1 tablet (1 mg total) by mouth daily., Disp: 30 tablet, Rfl: 3   HYDROcodone -acetaminophen  (NORCO/VICODIN) 5-325 MG tablet, Take 1 tablet by mouth every 6 (six) hours as needed for moderate pain (pain score 4-6)., Disp: 20 tablet, Rfl: 0   levofloxacin  (LEVAQUIN ) 500 MG tablet, Take 500 mg by mouth daily., Disp: , Rfl:    lidocaine  (XYLOCAINE ) 2 % solution, Use as directed 15 mLs in the mouth or throat every 6 (six) hours as needed for mouth pain. Please take 15 cc (1 tablespoon) half hour before you try to eat., Disp: 300 mL, Rfl: 4   ondansetron  (ZOFRAN ) 8 MG tablet, Take 1 tablet (8 mg total) by mouth every 8 (eight) hours as needed for nausea or vomiting. Start on third day after Carboplatin , Disp: 30 tablet, Rfl: 1   pantoprazole  (PROTONIX ) 40 MG tablet, Take 1 tablet (40 mg total) by mouth 2 (two) times daily., Disp: 60 tablet, Rfl: 5   prochlorperazine  (COMPAZINE ) 10 MG tablet, Take 1 tablet (10 mg total) by mouth every 6 (six) hours as needed for nausea or vomiting., Disp: 30 tablet, Rfl: 1   sucralfate  (CARAFATE ) 1 GM/10ML suspension, Take 10 mLs (1 g total) by mouth 4 (four) times daily -  with meals and at bedtime., Disp: 420 mL, Rfl: 4   SYMBICORT  160-4.5 MCG/ACT inhaler, Inhale 2 puffs into the lungs  4 (four) times daily as needed., Disp: , Rfl:    Vitamin D , Ergocalciferol , (DRISDOL ) 1.25 MG (50000 UNIT) CAPS capsule, Take 1 capsule by mouth once a week, Disp: 4 capsule, Rfl: 0  Allergies: No Known Allergies  Past Medical History, Surgical history, Social history, and Family History were reviewed and updated.  Review of Systems: Review of Systems  Constitutional:  Positive for appetite change and unexpected weight change.  HENT:   Positive for sore throat.   Eyes: Negative.   Respiratory: Negative.    Cardiovascular: Negative.   Gastrointestinal:  Positive for nausea.  Endocrine: Negative.   Genitourinary: Negative.    Musculoskeletal: Negative.   Skin: Negative.    Neurological:  Positive for dizziness.  Hematological: Negative.   Psychiatric/Behavioral: Negative.      Physical Exam: Vital signs show temperature of 98.2.  Pulse 72.  Blood pressure 136/69.  Weight is 154 pounds.    Wt Readings from Last 3 Encounters:  06/20/24 154 lb (69.9 kg)  06/13/24 149 lb (67.6 kg)  06/01/24 149 lb 4 oz (67.7 kg)    Physical Exam Vitals reviewed.  HENT:     Head: Normocephalic and atraumatic.  Eyes:     Pupils: Pupils are equal, round, and reactive to light.  Cardiovascular:     Rate and Rhythm: Normal rate and regular rhythm.     Heart sounds: Normal heart sounds.  Pulmonary:     Effort: Pulmonary effort is normal.     Breath sounds: Normal breath sounds.  Abdominal:     General: Bowel sounds are normal.     Palpations: Abdomen is soft.  Musculoskeletal:        General: No tenderness or deformity. Normal range of motion.     Cervical back: Normal range of motion.  Lymphadenopathy:     Cervical: No cervical adenopathy.  Skin:    General: Skin is warm and dry.     Findings: No erythema or rash.  Neurological:     Mental Status: She is alert and oriented to person, place, and time.  Psychiatric:        Behavior: Behavior normal.        Thought Content: Thought content normal.        Judgment: Judgment normal.      Lab Results  Component Value Date   WBC 3.3 (L) 06/20/2024   HGB 11.2 (L) 06/20/2024   HCT 35.1 (L) 06/20/2024   MCV 90.2 06/20/2024   PLT 217 06/20/2024     Chemistry      Component Value Date/Time   NA 140 06/20/2024 1044   K 3.8 06/20/2024 1044   CL 106 06/20/2024 1044   CO2 26 06/20/2024 1044   BUN 8 06/20/2024 1044   CREATININE 0.63 06/20/2024 1044      Component Value Date/Time   CALCIUM  8.8 (L) 06/20/2024 1044   ALKPHOS 70 06/20/2024 1044   AST 12 (L) 06/20/2024 1044   ALT <5 06/20/2024 1044   BILITOT 0.4 06/20/2024 1044       Impression and Plan: Tammy Cordova is a very charming 58 year old  Afro-American female with extensive stage small cell lung cancer.  Because of the bulk of her mediastinal disease, we went ahead and gave radiation with chemotherapy.  Again, she had a very nice response by the PET scan.  She had PCI.  I think this has certainly helped her out.  She currently is on maintenance Tecentriq .  We will go ahead with her  4th cycle today.  I do not think that we need another PET scan probably until September.  Will plan to get her back in 3 more weeks.   Maude JONELLE Crease, MD 7/22/202511:36 AM

## 2024-06-20 NOTE — Progress Notes (Signed)
 Patient will proceed with cycle four today. No additional scans or treatment needs.   Oncology Nurse Navigator Documentation     06/20/2024   10:30 AM  Oncology Nurse Navigator Flowsheets  Navigator Follow Up Date: 07/12/2024  Navigator Follow Up Reason: Follow-up Appointment;Chemotherapy  Navigator Location [facility]  Navigator Encounter Type Follow-up Appt  Patient Visit Type MedOnc  Treatment Phase Active Tx  Barriers/Navigation Needs No Barriers At This Time  Interventions None Required  Acuity Level 1-No Barriers  Support Groups/Services Friends and Family  Time Spent with Patient 15

## 2024-06-20 NOTE — Patient Instructions (Signed)
 [facility] - A DEPT OF [facility]  Discharge Instructions: Thank you for choosing [facility] to provide your oncology and hematology care.   If you have a lab appointment with the Cancer Center, please go directly to the Cancer Center and check in at the registration area.  Wear comfortable clothing and clothing appropriate for easy access to any Portacath or PICC line.   We strive to give you quality time with your provider. You may need to reschedule your appointment if you arrive late (15 or more minutes).  Arriving late affects you and other patients whose appointments are after yours.  Also, if you miss three or more appointments without notifying the office, you may be dismissed from the clinic at the provider's discretion.      For prescription refill requests, have your pharmacy contact our office and allow 72 hours for refills to be completed.    Today you received the following chemotherapy and/or immunotherapy agents Tecentriq      To help prevent nausea and vomiting after your treatment, we encourage you to take your nausea medication as directed.  BELOW ARE SYMPTOMS THAT SHOULD BE REPORTED IMMEDIATELY: *FEVER GREATER THAN 100.4 F (38 C) OR HIGHER *CHILLS OR SWEATING *NAUSEA AND VOMITING THAT IS NOT CONTROLLED WITH YOUR NAUSEA MEDICATION *UNUSUAL SHORTNESS OF BREATH *UNUSUAL BRUISING OR BLEEDING *URINARY PROBLEMS (pain or burning when urinating, or frequent urination) *BOWEL PROBLEMS (unusual diarrhea, constipation, pain near the anus) TENDERNESS IN MOUTH AND THROAT WITH OR WITHOUT PRESENCE OF ULCERS (sore throat, sores in mouth, or a toothache) UNUSUAL RASH, SWELLING OR PAIN  UNUSUAL VAGINAL DISCHARGE OR ITCHING   Items with * indicate a potential emergency and should be followed up as soon as possible or go to the Emergency Department if any problems should occur.  Please show the CHEMOTHERAPY ALERT CARD or IMMUNOTHERAPY  ALERT CARD at check-in to the Emergency Department and triage nurse. Should you have questions after your visit or need to cancel or reschedule your appointment, please contact [facility] - A DEPT OF [facility]  [phone] and follow the prompts.  Office hours are 8:00 a.m. to 4:30 p.m. Monday - Friday. Please note that voicemails left after 4:00 p.m. may not be returned until the following business day.  We are closed weekends and major holidays. You have access to a nurse at all times for urgent questions. Please call the main number to the clinic [phone] and follow the prompts.  For any non-urgent questions, you may also contact your provider using MyChart. We now offer e-Visits for anyone 74 and older to request care online for non-urgent symptoms. For details visit [email or web address].   Also download the MyChart app! Go to the app store, search "MyChart", open the app, select [place], and log in with your MyChart username and password.

## 2024-06-28 ENCOUNTER — Ambulatory Visit (HOSPITAL_COMMUNITY)
Admission: EM | Admit: 2024-06-28 | Discharge: 2024-06-28 | Disposition: A | Discharge status: Home / Self Care | Attending: Family Medicine | Admitting: Family Medicine

## 2024-06-28 ENCOUNTER — Encounter (HOSPITAL_COMMUNITY): Payer: Self-pay

## 2024-06-28 ENCOUNTER — Telehealth: Payer: Self-pay | Admitting: *Deleted

## 2024-06-28 DIAGNOSIS — R051 Acute cough: Secondary | ICD-10-CM

## 2024-06-28 DIAGNOSIS — U071 COVID-19: Secondary | ICD-10-CM | POA: Diagnosis not present

## 2024-06-28 DIAGNOSIS — J069 Acute upper respiratory infection, unspecified: Secondary | ICD-10-CM | POA: Diagnosis not present

## 2024-06-28 LAB — POC COVID19/FLU A&B COMBO
Covid Antigen, POC: POSITIVE — AB
Influenza A Antigen, POC: NEGATIVE
Influenza B Antigen, POC: NEGATIVE

## 2024-06-28 MED ORDER — HYDROCODONE BIT-HOMATROP MBR 5-1.5 MG/5ML PO SOLN: 5.0000 mL | Freq: Four times a day (QID) | ORAL | 0 refills | Status: DC | PRN

## 2024-06-28 NOTE — Discharge Instructions (Signed)
 Be aware, you have been prescribed pain medications that may cause drowsiness. While taking this medication, do not take any other medications containing acetaminophen (Tylenol). Do not combine with alcohol or recreational drugs. Please do not drive, operate heavy machinery, or take part in activities that require making important decisions while on this medication as your judgement may be clouded.

## 2024-06-28 NOTE — ED Triage Notes (Signed)
 Pt presents with cough, nasal congestion, and sneezing x 3 days. Denies any fever, diarrhea.   No OTC meds to alleviate symptoms.

## 2024-06-28 NOTE — ED Provider Notes (Signed)
 [facility]   [id number] 06/28/24 Arrival Time: 0919  ASSESSMENT & PLAN:  1. Acute cough   2. Viral URI with cough   3. COVID-19 virus infection    Without resp distress. Discussed typical duration of likely viral illness. Results for orders placed or performed during the hospital encounter of 06/28/24  POC Covid19/Flu A&B Antigen   Collection Time: 06/28/24 10:10 AM  Result Value Ref Range   Influenza A Antigen, POC Negative Negative   Influenza B Antigen, POC Negative Negative   Covid Antigen, POC Positive (A) Negative   OTC symptom care as needed.  Meds ordered this encounter  Medications   HYDROcodone  bit-homatropine (HYCODAN) 5-1.5 MG/5ML syrup    Sig: Take 5 mLs by mouth every 6 (six) hours as needed for cough.    Dispense:  90 mL    Refill:  0     Follow-up Information     Pahwani, Velna SAUNDERS, MD.   Specialty: Internal Medicine Why: As needed. Contact information: [address] [place] [address] [phone]                 Reviewed expectations re: course of current medical issues. Questions answered. Outlined signs and symptoms indicating need for more acute intervention. Understanding verbalized. After Visit Summary given.   SUBJECTIVE: History from: Patient. Tammy Cordova is a 58 y.o. female. Pt presents with cough, nasal congestion, and sneezing x 3 days. Denies any fever, diarrhea.   No OTC meds to alleviate symptoms.  Denies: difficulty breathing. Normal PO intake without n/v/d.  OBJECTIVE:  Vitals:   06/28/24 0941  BP: (!) 148/80  Pulse: 90  Resp: 18  Temp: 98.6 F (37 C)  TempSrc: Oral  SpO2: 96%    General appearance: alert; no distress Eyes: PERRLA; EOMI; conjunctiva normal HENT: [place]; AT; with nasal congestion Neck: supple  Lungs: speaks full sentences without difficulty; unlabored; dry cough; CTAB Extremities: no edema Skin: warm and dry Neurologic: normal gait Psychological: alert and  cooperative; normal mood and affect  Labs: Results for orders placed or performed during the hospital encounter of 06/28/24  POC Covid19/Flu A&B Antigen   Collection Time: 06/28/24 10:10 AM  Result Value Ref Range   Influenza A Antigen, POC Negative Negative   Influenza B Antigen, POC Negative Negative   Covid Antigen, POC Positive (A) Negative   Labs Reviewed  POC COVID19/FLU A&B COMBO - Abnormal; Notable for the following components:      Result Value   Covid Antigen, POC Positive (*)    All other components within normal limits    Imaging: No results found.  No Known Allergies  Past Medical History:  Diagnosis Date   Asthma    COPD (chronic obstructive pulmonary disease) (HCC)    GERD (gastroesophageal reflux disease)    Lung cancer (HCC)    Pneumonia    RA (rheumatoid arthritis) (HCC)    Vitamin D  deficiency    Social History   Socioeconomic History   Marital status: Married    Spouse name: Not on file   Number of children: 0   Years of education: Not on file   Highest education level: Not on file  Occupational History   Not on file  Tobacco Use   Smoking status: Former    Current packs/day: 0.00    Average packs/day: 0.3 packs/day for 36.0 years (10.8 ttl pk-yrs)    Types: Cigarettes    Quit date: 11/23/2023    Years  since quitting: 0.5   Smokeless tobacco: Never   Tobacco comments:    Quit smoking the Saturday before Christmas 2024  Vaping Use   Vaping status: Never Used  Substance and Sexual Activity   Alcohol use: No   Drug use: Yes    Types: Marijuana   Sexual activity: Not on file  Other Topics Concern   Not on file  Social History Narrative   Not on file   Social Drivers of Health   Financial Resource Strain: Medium Risk (02/04/2024)   Overall Financial Resource Strain (CARDIA)    Difficulty of Paying Living Expenses: Somewhat hard  Food Insecurity: Patient Declined (12/17/2023)   Hunger Vital Sign    Worried About Running Out of Food in  the Last Year: Patient declined    Ran Out of Food in the Last Year: Patient declined  Recent Concern: Food Insecurity - Food Insecurity Present (12/03/2023)   Hunger Vital Sign    Worried About Running Out of Food in the Last Year: Sometimes true    Ran Out of Food in the Last Year: Sometimes true  Transportation Needs: No Transportation Needs (12/17/2023)   PRAPARE - Administrator, Civil Service (Medical): No    Lack of Transportation (Non-Medical): No  Physical Activity: Not on file  Stress: Not on file  Social Connections: Socially Integrated (12/03/2023)   Social Connection and Isolation Panel    Frequency of Communication with Friends and Family: Once a week    Frequency of Social Gatherings with Friends and Family: Twice a week    Attends Religious Services: 1 to 4 times per year    Active Member of [employer] or Organizations: Yes    Attends Banker Meetings: 1 to 4 times per year    Marital Status: Married  Catering manager Violence: Not At Risk (12/17/2023)   Humiliation, Afraid, Rape, and Kick questionnaire    Fear of Current or Ex-Partner: No    Emotionally Abused: No    Physically Abused: No    Sexually Abused: No   Family History  Problem Relation Age of Onset   Diabetes Mother    Hypertension Mother    Diabetes Father    Hypertension Father    Prostate cancer Father    Hypertension Brother    Diabetes Brother    Stroke Brother    Breast cancer Paternal Grandmother 35   Cancer Paternal Grandfather        type unknown   Past Surgical History:  Procedure Laterality Date   BREAST BIOPSY Left 04/2016   REACTIVE LYMPHOID HYPERPLASIA    BUNIONECTOMY Bilateral    bilateral   IR IMAGING GUIDED PORT INSERTION  12/03/2023     Rolinda Rogue, MD 06/28/24 1044

## 2024-06-28 NOTE — Telephone Encounter (Signed)
 Patient called stating that she feels like she has a summer cold, sore, itchy throat.  No fever.  Told patient that Dr Timmy not in the office today.  Patient will go to urgent care to be evaluated.

## 2024-07-03 ENCOUNTER — Other Ambulatory Visit: Payer: Self-pay | Admitting: Hematology & Oncology

## 2024-07-05 ENCOUNTER — Ambulatory Visit

## 2024-07-05 ENCOUNTER — Other Ambulatory Visit

## 2024-07-05 ENCOUNTER — Ambulatory Visit: Admitting: Hematology & Oncology

## 2024-07-10 ENCOUNTER — Other Ambulatory Visit: Payer: Self-pay | Admitting: Hematology & Oncology

## 2024-07-12 ENCOUNTER — Encounter: Payer: Self-pay | Admitting: *Deleted

## 2024-07-12 ENCOUNTER — Inpatient Hospital Stay: Payer: Self-pay | Attending: Hematology & Oncology

## 2024-07-12 ENCOUNTER — Inpatient Hospital Stay: Payer: Self-pay

## 2024-07-12 ENCOUNTER — Encounter (HOSPITAL_COMMUNITY): Payer: Self-pay | Admitting: Hematology & Oncology

## 2024-07-12 ENCOUNTER — Encounter: Payer: Self-pay | Admitting: Hematology & Oncology

## 2024-07-12 ENCOUNTER — Inpatient Hospital Stay: Admitting: Hematology & Oncology

## 2024-07-12 VITALS — BP 133/61 | HR 75

## 2024-07-12 VITALS — BP 165/77 | HR 65 | Temp 98.3°F | Resp 18 | Ht 64.0 in | Wt 148.0 lb

## 2024-07-12 DIAGNOSIS — Z5112 Encounter for antineoplastic immunotherapy: Secondary | ICD-10-CM | POA: Insufficient documentation

## 2024-07-12 DIAGNOSIS — C3411 Malignant neoplasm of upper lobe, right bronchus or lung: Secondary | ICD-10-CM

## 2024-07-12 DIAGNOSIS — C3490 Malignant neoplasm of unspecified part of unspecified bronchus or lung: Secondary | ICD-10-CM | POA: Insufficient documentation

## 2024-07-12 DIAGNOSIS — Z79899 Other long term (current) drug therapy: Secondary | ICD-10-CM | POA: Insufficient documentation

## 2024-07-12 LAB — CBC WITH DIFFERENTIAL (CANCER CENTER ONLY)
Abs Immature Granulocytes: 0.02 K/uL (ref 0.00–0.07)
Basophils Absolute: 0 K/uL (ref 0.0–0.1)
Basophils Relative: 0 %
Eosinophils Absolute: 0 K/uL (ref 0.0–0.5)
Eosinophils Relative: 1 %
HCT: 37.1 % (ref 36.0–46.0)
Hemoglobin: 12 g/dL (ref 12.0–15.0)
Immature Granulocytes: 0 %
Lymphocytes Relative: 16 %
Lymphs Abs: 0.9 K/uL (ref 0.7–4.0)
MCH: 29.1 pg (ref 26.0–34.0)
MCHC: 32.3 g/dL (ref 30.0–36.0)
MCV: 90 fL (ref 80.0–100.0)
Monocytes Absolute: 0.3 K/uL (ref 0.1–1.0)
Monocytes Relative: 6 %
Neutro Abs: 4.2 K/uL (ref 1.7–7.7)
Neutrophils Relative %: 77 %
Platelet Count: 228 K/uL (ref 150–400)
RBC: 4.12 MIL/uL (ref 3.87–5.11)
RDW: 15.7 % — ABNORMAL HIGH (ref 11.5–15.5)
WBC Count: 5.4 K/uL (ref 4.0–10.5)
nRBC: 0 % (ref 0.0–0.2)

## 2024-07-12 LAB — CMP (CANCER CENTER ONLY)
ALT: <5 U/L (ref 0–44)
AST: 12 U/L — ABNORMAL LOW (ref 15–41)
Albumin: 3.9 g/dL (ref 3.5–5.0)
Alkaline Phosphatase: 69 U/L (ref 38–126)
Anion gap: 10 (ref 5–15)
BUN: 12 mg/dL (ref 6–20)
CO2: 23 mmol/L (ref 22–32)
Calcium: 9 mg/dL (ref 8.9–10.3)
Chloride: 108 mmol/L (ref 98–111)
Creatinine: 0.7 mg/dL (ref 0.44–1.00)
GFR, Estimated: >60 mL/min (ref >60)
Glucose, Bld: 117 mg/dL — ABNORMAL HIGH (ref 70–99)
Potassium: 3.6 mmol/L (ref 3.5–5.1)
Sodium: 141 mmol/L (ref 135–145)
Total Bilirubin: 0.3 mg/dL (ref 0.0–1.2)
Total Protein: 7.6 g/dL (ref 6.5–8.1)

## 2024-07-12 LAB — IRON AND IRON BINDING CAPACITY (CC-WL,HP ONLY)
Iron: 71 ug/dL (ref 28–170)
Saturation Ratios: 26 % (ref 10.4–31.8)
TIBC: 276 ug/dL (ref 250–450)
UIBC: 205 ug/dL

## 2024-07-12 LAB — RETICULOCYTES
Immature Retic Fract: 8.4 % (ref 2.3–15.9)
RBC.: 4.08 MIL/uL (ref 3.87–5.11)
Retic Count, Absolute: 35.1 K/uL (ref 19.0–186.0)
Retic Ct Pct: 0.9 % (ref 0.4–3.1)

## 2024-07-12 LAB — FERRITIN: Ferritin: 152 ng/mL (ref 11–307)

## 2024-07-12 MED ORDER — SODIUM CHLORIDE 0.9 % IV SOLN
1200.0000 mg | Freq: Once | INTRAVENOUS | Status: AC
  Administered 2024-07-12 (×2): 1200 mg via INTRAVENOUS
  Filled 2024-07-12: qty 20

## 2024-07-12 MED ORDER — SODIUM CHLORIDE 0.9 % IV SOLN: INTRAVENOUS | Status: DC

## 2024-07-12 MED ORDER — DRONABINOL 10 MG PO CAPS: 10.0000 mg | ORAL_CAPSULE | Freq: Two times a day (BID) | ORAL | 0 refills | Status: DC

## 2024-07-12 NOTE — Addendum Note (Signed)
 Addended by: TIMMY COY R on: 07/12/2024 02:23 PM   Modules accepted: Orders

## 2024-07-12 NOTE — Progress Notes (Signed)
 Patient will proceed with cycle five. She will need a PET after six cycles.   Oncology Nurse Navigator Documentation     07/12/2024   10:45 AM  Oncology Nurse Navigator Flowsheets  Navigator Follow Up Date: 08/02/2024  Navigator Follow Up Reason: Follow-up Appointment;Chemotherapy  Navigator Location [facility]  Navigator Encounter Type Follow-up Appt  Patient Visit Type MedOnc  Treatment Phase Active Tx  Barriers/Navigation Needs No Barriers At This Time  Interventions None Required  Acuity Level 1-No Barriers  Support Groups/Services Friends and Family  Time Spent with Patient 15

## 2024-07-12 NOTE — Patient Instructions (Signed)

## 2024-07-12 NOTE — Progress Notes (Signed)
 ScreeningSo 1027 the patient Hematology and Oncology Follow Up Visit  LUNA AUDIA [id number] July 10, 1966 58 y.o. 07/12/2024   Principle Diagnosis:  Extensive stage small cell lung cancer --TMB=23  Current Therapy:   Status post cycle 1 of carboplatinum/etoposide  + XRT s/p cycle #4 -- start on 12/04/2023 Tecentriq  - maintenance s/p cycle #3 -- start on 03/16/2024 PCI -start on 04/10/2024 -completed on 04/27/2024     Interim History:  Ms. Cifuentes is back for follow-up.  She is doing quite well.  She feels well.  Her birthday is tomorrow.  She is going down to [facility] this weekend.  I am glad that she is feeling so well.  I am so happy for her.  She really has done nicely.  She has little bit of pruritus.  I told her that this may have to be coming from the immunotherapy.  I told her to try some aloe vera lotion to see if this may help.  She has had no issues with headaches.  There is no visual changes.  There is no nausea or vomiting.  She has had no problems with bowels or bladder.  Her appetite is good.  [name]  certainly seems to help.  Overall, she has had no problems with dysphagia or odynophagia.  Currently, I would say that her performance status is probably ECOG 1.    Medications:  Current Outpatient Medications:    albuterol  (VENTOLIN  HFA) 108 (90 Base) MCG/ACT inhaler, Inhale 2 puffs into the lungs every 6 (six) hours as needed for wheezing or shortness of breath., Disp: , Rfl:    ALPRAZolam  (XANAX ) 0.5 MG tablet, Take 1 tablet (0.5 mg total) by mouth daily. Please take half hour before radiation therapy., Disp: 21 tablet, Rfl: 0   dexamethasone  (DECADRON ) 4 MG tablet, Take 2 tablets (8 mg total) by mouth daily., Disp: 60 tablet, Rfl: 4   dronabinol  ([name] ) 2.5 MG capsule, Take 1 capsule (2.5 mg total) by mouth 2 (two) times daily before lunch and supper., Disp: 60 capsule, Rfl: 0   folic acid  (FOLVITE ) 1 MG tablet, Take 1 tablet (1 mg total) by mouth daily., Disp: 30  tablet, Rfl: 3   HYDROcodone  bit-homatropine (HYCODAN) 5-1.5 MG/5ML syrup, Take 5 mLs by mouth every 6 (six) hours as needed for cough., Disp: 90 mL, Rfl: 0   levofloxacin  (LEVAQUIN ) 500 MG tablet, Take 500 mg by mouth daily., Disp: , Rfl:    lidocaine  (XYLOCAINE ) 2 % solution, Use as directed 15 mLs in the mouth or throat every 6 (six) hours as needed for mouth pain. Please take 15 cc (1 tablespoon) half hour before you try to eat., Disp: 300 mL, Rfl: 4   ondansetron  (ZOFRAN ) 8 MG tablet, Take 1 tablet (8 mg total) by mouth every 8 (eight) hours as needed for nausea or vomiting. Start on third day after Carboplatin , Disp: 30 tablet, Rfl: 1   pantoprazole  (PROTONIX ) 40 MG tablet, Take 1 tablet (40 mg total) by mouth 2 (two) times daily., Disp: 60 tablet, Rfl: 5   prochlorperazine  (COMPAZINE ) 10 MG tablet, TAKE 1 TABLET BY MOUTH EVERY 6 HOURS AS NEEDED FOR NAUSEA FOR VOMITING, Disp: 30 tablet, Rfl: 0   sucralfate  (CARAFATE ) 1 GM/10ML suspension, Take 10 mLs (1 g total) by mouth 4 (four) times daily -  with meals and at bedtime., Disp: 420 mL, Rfl: 4   SYMBICORT  160-4.5 MCG/ACT inhaler, Inhale 2 puffs into the lungs 4 (four) times daily as needed., Disp: , Rfl:  valACYclovir (VALTREX) 500 MG tablet, Take 500 mg by mouth 2 (two) times daily., Disp: , Rfl:    Vitamin D , Ergocalciferol , (DRISDOL ) 1.25 MG (50000 UNIT) CAPS capsule, Take 1 capsule by mouth once a week, Disp: 4 capsule, Rfl: 0  Allergies: No Known Allergies  Past Medical History, Surgical history, Social history, and Family History were reviewed and updated.  Review of Systems: Review of Systems  Constitutional:  Positive for appetite change and unexpected weight change.  HENT:   Positive for sore throat.   Eyes: Negative.   Respiratory: Negative.    Cardiovascular: Negative.   Gastrointestinal:  Positive for nausea.  Endocrine: Negative.   Genitourinary: Negative.    Musculoskeletal: Negative.   Skin: Negative.   Neurological:   Positive for dizziness.  Hematological: Negative.   Psychiatric/Behavioral: Negative.      Physical Exam: Vital signs show temperature of 98.3.  Pulse 65.  Blood pressure 165/77.  Weight is up under 48 pounds.    Wt Readings from Last 3 Encounters:  07/12/24 148 lb (67.1 kg)  06/20/24 154 lb (69.9 kg)  06/13/24 149 lb (67.6 kg)    Physical Exam Vitals reviewed.  HENT:     Head: Normocephalic and atraumatic.  Eyes:     Pupils: Pupils are equal, round, and reactive to light.  Cardiovascular:     Rate and Rhythm: Normal rate and regular rhythm.     Heart sounds: Normal heart sounds.  Pulmonary:     Effort: Pulmonary effort is normal.     Breath sounds: Normal breath sounds.  Abdominal:     General: Bowel sounds are normal.     Palpations: Abdomen is soft.  Musculoskeletal:        General: No tenderness or deformity. Normal range of motion.     Cervical back: Normal range of motion.  Lymphadenopathy:     Cervical: No cervical adenopathy.  Skin:    General: Skin is warm and dry.     Findings: No erythema or rash.  Neurological:     Mental Status: She is alert and oriented to person, place, and time.  Psychiatric:        Behavior: Behavior normal.        Thought Content: Thought content normal.        Judgment: Judgment normal.      Lab Results  Component Value Date   WBC 5.4 07/12/2024   HGB 12.0 07/12/2024   HCT 37.1 07/12/2024   MCV 90.0 07/12/2024   PLT 228 07/12/2024     Chemistry      Component Value Date/Time   NA 141 07/12/2024 1108   K 3.6 07/12/2024 1108   CL 108 07/12/2024 1108   CO2 23 07/12/2024 1108   BUN 12 07/12/2024 1108   CREATININE 0.70 07/12/2024 1108      Component Value Date/Time   CALCIUM  9.0 07/12/2024 1108   ALKPHOS 69 07/12/2024 1108   AST 12 (L) 07/12/2024 1108   ALT <5 07/12/2024 1108   BILITOT 0.3 07/12/2024 1108       Impression and Plan: Ms. Amato is a very charming 58 year old Afro-American female with extensive  stage small cell lung cancer.  Because of the bulk of her mediastinal disease, we went ahead and gave radiation with chemotherapy.  Again, she had a very nice response by the last PET scan.  She had PCI.  I think this has certainly helped her out.  She currently is on maintenance Tecentriq .  We  will go ahead with her 5th cycle today.  I think that after 6 cycles of Tecentriq , we will then plan for a PET scan.  Again I know she will have a wonderful time down the [facility] for her birthday.  She is certainly looking forward to this.   Maude JONELLE Crease, MD 8/13/202511:42 AM

## 2024-07-12 NOTE — Patient Instructions (Signed)
 [facility] - A DEPT OF [facility]  Discharge Instructions: Thank you for choosing [facility] to provide your oncology and hematology care.   If you have a lab appointment with the Cancer Center, please go directly to the Cancer Center and check in at the registration area.  Wear comfortable clothing and clothing appropriate for easy access to any Portacath or PICC line.   We strive to give you quality time with your provider. You may need to reschedule your appointment if you arrive late (15 or more minutes).  Arriving late affects you and other patients whose appointments are after yours.  Also, if you miss three or more appointments without notifying the office, you may be dismissed from the clinic at the provider's discretion.      For prescription refill requests, have your pharmacy contact our office and allow 72 hours for refills to be completed.    Today you received the following chemotherapy and/or immunotherapy agents TECENTRIQ       To help prevent nausea and vomiting after your treatment, we encourage you to take your nausea medication as directed.  BELOW ARE SYMPTOMS THAT SHOULD BE REPORTED IMMEDIATELY: *FEVER GREATER THAN 100.4 F (38 C) OR HIGHER *CHILLS OR SWEATING *NAUSEA AND VOMITING THAT IS NOT CONTROLLED WITH YOUR NAUSEA MEDICATION *UNUSUAL SHORTNESS OF BREATH *UNUSUAL BRUISING OR BLEEDING *URINARY PROBLEMS (pain or burning when urinating, or frequent urination) *BOWEL PROBLEMS (unusual diarrhea, constipation, pain near the anus) TENDERNESS IN MOUTH AND THROAT WITH OR WITHOUT PRESENCE OF ULCERS (sore throat, sores in mouth, or a toothache) UNUSUAL RASH, SWELLING OR PAIN  UNUSUAL VAGINAL DISCHARGE OR ITCHING   Items with * indicate a potential emergency and should be followed up as soon as possible or go to the Emergency Department if any problems should occur.  Please show the CHEMOTHERAPY ALERT CARD or IMMUNOTHERAPY  ALERT CARD at check-in to the Emergency Department and triage nurse. Should you have questions after your visit or need to cancel or reschedule your appointment, please contact [facility] - A DEPT OF [facility]  [phone] and follow the prompts.  Office hours are 8:00 a.m. to 4:30 p.m. Monday - Friday. Please note that voicemails left after 4:00 p.m. may not be returned until the following business day.  We are closed weekends and major holidays. You have access to a nurse at all times for urgent questions. Please call the main number to the clinic [phone] and follow the prompts.  For any non-urgent questions, you may also contact your provider using MyChart. We now offer e-Visits for anyone 73 and older to request care online for non-urgent symptoms. For details visit [email or web address].   Also download the MyChart app! Go to the app store, search MyChart, open the app, select [facility], and log in with your MyChart username and password.

## 2024-07-13 ENCOUNTER — Encounter (HOSPITAL_COMMUNITY): Payer: Self-pay | Admitting: Hematology & Oncology

## 2024-07-14 ENCOUNTER — Other Ambulatory Visit: Payer: Self-pay

## 2024-07-19 ENCOUNTER — Other Ambulatory Visit: Payer: Self-pay

## 2024-07-26 ENCOUNTER — Other Ambulatory Visit: Payer: Self-pay

## 2024-07-26 MED ORDER — DRONABINOL 2.5 MG PO CAPS: 2.5000 mg | ORAL_CAPSULE | Freq: Two times a day (BID) | ORAL | 2 refills | Status: DC

## 2024-07-28 ENCOUNTER — Other Ambulatory Visit: Payer: Self-pay

## 2024-08-01 ENCOUNTER — Encounter: Payer: Self-pay | Admitting: Hematology & Oncology

## 2024-08-02 ENCOUNTER — Ambulatory Visit: Payer: Self-pay

## 2024-08-02 ENCOUNTER — Inpatient Hospital Stay (HOSPITAL_BASED_OUTPATIENT_CLINIC_OR_DEPARTMENT_OTHER): Payer: Self-pay | Admitting: Hematology & Oncology

## 2024-08-02 ENCOUNTER — Encounter: Payer: Self-pay | Admitting: Hematology & Oncology

## 2024-08-02 ENCOUNTER — Encounter: Payer: Self-pay | Admitting: *Deleted

## 2024-08-02 ENCOUNTER — Other Ambulatory Visit: Payer: Self-pay

## 2024-08-02 ENCOUNTER — Inpatient Hospital Stay: Payer: Self-pay | Attending: Hematology & Oncology

## 2024-08-02 VITALS — BP 125/74 | HR 79 | Temp 98.2°F | Resp 18 | Ht 64.0 in | Wt 156.1 lb

## 2024-08-02 DIAGNOSIS — C3411 Malignant neoplasm of upper lobe, right bronchus or lung: Secondary | ICD-10-CM

## 2024-08-02 DIAGNOSIS — Z5112 Encounter for antineoplastic immunotherapy: Secondary | ICD-10-CM | POA: Insufficient documentation

## 2024-08-02 DIAGNOSIS — C3490 Malignant neoplasm of unspecified part of unspecified bronchus or lung: Secondary | ICD-10-CM | POA: Diagnosis present

## 2024-08-02 DIAGNOSIS — Z9221 Personal history of antineoplastic chemotherapy: Secondary | ICD-10-CM | POA: Insufficient documentation

## 2024-08-02 DIAGNOSIS — Z79899 Other long term (current) drug therapy: Secondary | ICD-10-CM | POA: Diagnosis not present

## 2024-08-02 LAB — CMP (CANCER CENTER ONLY)
ALT: 6 U/L (ref 0–44)
AST: 12 U/L — ABNORMAL LOW (ref 15–41)
Albumin: 4 g/dL (ref 3.5–5.0)
Alkaline Phosphatase: 89 U/L (ref 38–126)
Anion gap: 10 (ref 5–15)
BUN: 13 mg/dL (ref 6–20)
CO2: 24 mmol/L (ref 22–32)
Calcium: 9.7 mg/dL (ref 8.9–10.3)
Chloride: 106 mmol/L (ref 98–111)
Creatinine: 0.72 mg/dL (ref 0.44–1.00)
GFR, Estimated: >60 mL/min (ref >60)
Glucose, Bld: 113 mg/dL — ABNORMAL HIGH (ref 70–99)
Potassium: 3.9 mmol/L (ref 3.5–5.1)
Sodium: 140 mmol/L (ref 135–145)
Total Bilirubin: 0.3 mg/dL (ref 0.0–1.2)
Total Protein: 8.2 g/dL — ABNORMAL HIGH (ref 6.5–8.1)

## 2024-08-02 LAB — CBC WITH DIFFERENTIAL (CANCER CENTER ONLY)
Abs Immature Granulocytes: 0 K/uL (ref 0.00–0.07)
Basophils Absolute: 0 K/uL (ref 0.0–0.1)
Basophils Relative: 0 %
Eosinophils Absolute: 0 K/uL (ref 0.0–0.5)
Eosinophils Relative: 0 %
HCT: 39 % (ref 36.0–46.0)
Hemoglobin: 12.7 g/dL (ref 12.0–15.0)
Immature Granulocytes: 0 %
Lymphocytes Relative: 14 %
Lymphs Abs: 0.4 K/uL — ABNORMAL LOW (ref 0.7–4.0)
MCH: 29.3 pg (ref 26.0–34.0)
MCHC: 32.6 g/dL (ref 30.0–36.0)
MCV: 90.1 fL (ref 80.0–100.0)
Monocytes Absolute: 0.1 K/uL (ref 0.1–1.0)
Monocytes Relative: 4 %
Neutro Abs: 2.3 K/uL (ref 1.7–7.7)
Neutrophils Relative %: 82 %
Platelet Count: 269 K/uL (ref 150–400)
RBC: 4.33 MIL/uL (ref 3.87–5.11)
RDW: 14.6 % (ref 11.5–15.5)
WBC Count: 2.8 K/uL — ABNORMAL LOW (ref 4.0–10.5)
nRBC: 0 % (ref 0.0–0.2)

## 2024-08-02 LAB — LACTATE DEHYDROGENASE: LDH: 172 U/L (ref 98–192)

## 2024-08-02 MED ORDER — SODIUM CHLORIDE 0.9 % IV SOLN: INTRAVENOUS | Status: DC

## 2024-08-02 MED ORDER — SODIUM CHLORIDE 0.9 % IV SOLN
1200.0000 mg | Freq: Once | INTRAVENOUS | Status: AC
  Administered 2024-08-02: 1200 mg via INTRAVENOUS
  Filled 2024-08-02: qty 20

## 2024-08-02 NOTE — Patient Instructions (Signed)
 [facility] - A DEPT OF [facility]  Discharge Instructions: Thank you for choosing [facility] to provide your oncology and hematology care.   If you have a lab appointment with the Cancer Center, please go directly to the Cancer Center and check in at the registration area.  Wear comfortable clothing and clothing appropriate for easy access to any Portacath or PICC line.   We strive to give you quality time with your provider. You may need to reschedule your appointment if you arrive late (15 or more minutes).  Arriving late affects you and other patients whose appointments are after yours.  Also, if you miss three or more appointments without notifying the office, you may be dismissed from the clinic at the provider's discretion.      For prescription refill requests, have your pharmacy contact our office and allow 72 hours for refills to be completed.    Today you received the following chemotherapy and/or immunotherapy agents tecentriq       To help prevent nausea and vomiting after your treatment, we encourage you to take your nausea medication as directed.  BELOW ARE SYMPTOMS THAT SHOULD BE REPORTED IMMEDIATELY: *FEVER GREATER THAN 100.4 F (38 C) OR HIGHER *CHILLS OR SWEATING *NAUSEA AND VOMITING THAT IS NOT CONTROLLED WITH YOUR NAUSEA MEDICATION *UNUSUAL SHORTNESS OF BREATH *UNUSUAL BRUISING OR BLEEDING *URINARY PROBLEMS (pain or burning when urinating, or frequent urination) *BOWEL PROBLEMS (unusual diarrhea, constipation, pain near the anus) TENDERNESS IN MOUTH AND THROAT WITH OR WITHOUT PRESENCE OF ULCERS (sore throat, sores in mouth, or a toothache) UNUSUAL RASH, SWELLING OR PAIN  UNUSUAL VAGINAL DISCHARGE OR ITCHING   Items with * indicate a potential emergency and should be followed up as soon as possible or go to the Emergency Department if any problems should occur.  Please show the CHEMOTHERAPY ALERT CARD or IMMUNOTHERAPY  ALERT CARD at check-in to the Emergency Department and triage nurse. Should you have questions after your visit or need to cancel or reschedule your appointment, please contact [facility] - A DEPT OF [facility]  [phone] and follow the prompts.  Office hours are 8:00 a.m. to 4:30 p.m. Monday - Friday. Please note that voicemails left after 4:00 p.m. may not be returned until the following business day.  We are closed weekends and major holidays. You have access to a nurse at all times for urgent questions. Please call the main number to the clinic [phone] and follow the prompts.  For any non-urgent questions, you may also contact your provider using MyChart. We now offer e-Visits for anyone 28 and older to request care online for non-urgent symptoms. For details visit [email or web address].   Also download the MyChart app! Go to the app store, search "MyChart", open the app, select [place], and log in with your MyChart username and password.

## 2024-08-02 NOTE — Progress Notes (Signed)
 ScreeningSo 1027 the patient Hematology and Oncology Follow Up Visit  Tammy Cordova [id number] [birth year] 58 y.o. 08/02/2024   Principle Diagnosis:  Extensive stage small cell lung cancer --TMB=23  Current Therapy:   Status post cycle 1 of carboplatinum/etoposide  + XRT s/p cycle #4 -- start on 12/04/2023 Tecentriq  - maintenance s/p cycle #3 -- start on 03/16/2024 PCI -start on 04/10/2024 -completed on 04/27/2024     Interim History:  Tammy Cordova is back for follow-up.  She had a wonderful birthday.  She was down to [facility].  She had a great time down in [facility].  She ate quite a lot.  She had a good Labor Day weekend.  It was quite for her.  She is having some problems with her insurance.  Apparently, she cannot get Medicaid.  I will see if one of our staff can help her out with this.  She has had no problem with headaches.  There is no visual changes.  There is no cough or shortness of breath.  She has had no nausea or vomiting.  Tammy Cordova  does seem to be helping her.  She has had no issues with dysphagia or odynophagia.  There is been no change in bowel or bladder habits.  She has had no diarrhea.  There is no melena or bright red blood per rectum.  She has had no rashes.  There is been no swollen lymph nodes.  She has had no leg swelling.  Her last TSH was 1.8 in July.  Overall, I would say that her performance status is probably ECOG 1.      Medications:  Current Outpatient Medications:    albuterol  (VENTOLIN  HFA) 108 (90 Base) MCG/ACT inhaler, Inhale 2 puffs into the lungs every 6 (six) hours as needed for wheezing or shortness of breath., Disp: , Rfl:    ALPRAZolam  (XANAX ) 0.5 MG tablet, Take 1 tablet (0.5 mg total) by mouth daily. Please take half hour before radiation therapy., Disp: 21 tablet, Rfl: 0   dexamethasone  (DECADRON ) 4 MG tablet, Take 2 tablets (8 mg total) by mouth daily., Disp: 60 tablet, Rfl: 4   dronabinol  (Tammy Cordova ) 2.5 MG capsule, Take 1 capsule (2.5  mg total) by mouth 2 (two) times daily before lunch and supper., Disp: 180 capsule, Rfl: 2   folic acid  (FOLVITE ) 1 MG tablet, Take 1 tablet (1 mg total) by mouth daily., Disp: 30 tablet, Rfl: 3   HYDROcodone  bit-homatropine (HYCODAN) 5-1.5 MG/5ML syrup, Take 5 mLs by mouth every 6 (six) hours as needed for cough., Disp: 90 mL, Rfl: 0   levofloxacin  (LEVAQUIN ) 500 MG tablet, Take 500 mg by mouth daily., Disp: , Rfl:    lidocaine  (XYLOCAINE ) 2 % solution, Use as directed 15 mLs in the mouth or throat every 6 (six) hours as needed for mouth pain. Please take 15 cc (1 tablespoon) half hour before you try to eat., Disp: 300 mL, Rfl: 4   ondansetron  (ZOFRAN ) 8 MG tablet, Take 1 tablet (8 mg total) by mouth every 8 (eight) hours as needed for nausea or vomiting. Start on third day after Carboplatin , Disp: 30 tablet, Rfl: 1   pantoprazole  (PROTONIX ) 40 MG tablet, Take 1 tablet (40 mg total) by mouth 2 (two) times daily., Disp: 60 tablet, Rfl: 5   prochlorperazine  (COMPAZINE ) 10 MG tablet, TAKE 1 TABLET BY MOUTH EVERY 6 HOURS AS NEEDED FOR NAUSEA FOR VOMITING, Disp: 30 tablet, Rfl: 0   sucralfate  (CARAFATE ) 1 GM/10ML suspension, Take 10 mLs (1  g total) by mouth 4 (four) times daily -  with meals and at bedtime., Disp: 420 mL, Rfl: 4   SYMBICORT  160-4.5 MCG/ACT inhaler, Inhale 2 puffs into the lungs 4 (four) times daily as needed., Disp: , Rfl:    valACYclovir (VALTREX) 500 MG tablet, Take 500 mg by mouth 2 (two) times daily., Disp: , Rfl:    Vitamin D , Ergocalciferol , (DRISDOL ) 1.25 MG (50000 UNIT) CAPS capsule, Take 1 capsule by mouth once a week, Disp: 4 capsule, Rfl: 0  Allergies: No Known Allergies  Past Medical History, Surgical history, Social history, and Family History were reviewed and updated.  Review of Systems: Review of Systems  Constitutional:  Positive for appetite change and unexpected weight change.  HENT:   Positive for sore throat.   Eyes: Negative.   Respiratory: Negative.     Cardiovascular: Negative.   Gastrointestinal:  Positive for nausea.  Endocrine: Negative.   Genitourinary: Negative.    Musculoskeletal: Negative.   Skin: Negative.   Neurological:  Positive for dizziness.  Hematological: Negative.   Psychiatric/Behavioral: Negative.      Physical Exam: Vital signs show temperature of 98.2.  Pulse 79.  Blood pressure 125/74 weight is 156 lb.    Wt Readings from Last 3 Encounters:  08/02/24 156 lb 1.9 oz (70.8 kg)  07/12/24 148 lb (67.1 kg)  06/20/24 154 lb (69.9 kg)    Physical Exam Vitals reviewed.  HENT:     Head: Normocephalic and atraumatic.  Eyes:     Pupils: Pupils are equal, round, and reactive to light.  Cardiovascular:     Rate and Rhythm: Normal rate and regular rhythm.     Heart sounds: Normal heart sounds.  Pulmonary:     Effort: Pulmonary effort is normal.     Breath sounds: Normal breath sounds.  Abdominal:     General: Bowel sounds are normal.     Palpations: Abdomen is soft.  Musculoskeletal:        General: No tenderness or deformity. Normal range of motion.     Cervical back: Normal range of motion.  Lymphadenopathy:     Cervical: No cervical adenopathy.  Skin:    General: Skin is warm and dry.     Findings: No erythema or rash.  Neurological:     Mental Status: She is alert and oriented to person, place, and time.  Psychiatric:        Behavior: Behavior normal.        Thought Content: Thought content normal.        Judgment: Judgment normal.      Lab Results  Component Value Date   WBC 2.8 (L) 08/02/2024   HGB 12.7 08/02/2024   HCT 39.0 08/02/2024   MCV 90.1 08/02/2024   PLT 269 08/02/2024     Chemistry      Component Value Date/Time   NA 141 07/12/2024 1108   K 3.6 07/12/2024 1108   CL 108 07/12/2024 1108   CO2 23 07/12/2024 1108   BUN 12 07/12/2024 1108   CREATININE 0.70 07/12/2024 1108      Component Value Date/Time   CALCIUM  9.0 07/12/2024 1108   ALKPHOS 69 07/12/2024 1108   AST 12 (L)  07/12/2024 1108   ALT <5 07/12/2024 1108   BILITOT 0.3 07/12/2024 1108       Impression and Plan: Tammy Cordova is a very charming 58 year old Afro-American female with extensive stage small cell lung cancer.  Because of the bulk of her mediastinal  disease, we went ahead and gave radiation with chemotherapy.  Again, she had a very nice response by the last PET scan.  She had PCI.  I think this has certainly helped her out.  She currently is on maintenance Tecentriq .  We will go ahead with her 6th cycle today.  I will then set her up with a PET scan.  We will get this set up in about 3 weeks.  I will give her an extra week off from treatment so  that she can get the PET scan.  Hopefully, we can get this insurance issue figured out for her.  I am happy that her quality of life is doing so well right now.  We will see her back in 1 month.  Again, she will get an extra week off so we get the PET scan.   Maude JONELLE Crease, MD 9/3/202511:04 AM

## 2024-08-02 NOTE — Progress Notes (Signed)
 Ok to proceed with treatment today w auth pending with new insurance.  Bridgett Leach [place], [place], BCPS, BCOP 08/02/2024 12:02 PM

## 2024-08-02 NOTE — Progress Notes (Signed)
 Patient will proceed with cycle 11. She will need a PET after this cycle.   Patient is having some difficulty with insurance. She has applied for Medicaid and states she was denied as she needs to provide $17K in unpaid medical bills. Message sent to [facility] requesting follow up and possible assistance with Medicaid.  Patient will need a PET after this cycle. Scheduled for 08/24/2024.  Patient is aware of PET appointment including date, time, and location. The following prep is reviewed with patient and confirmed with teachback: - arrive 30 minutes before appointment time - NPO except water for 6h before scan. No candy, no gum - hold any diabetic medication the morning of the scan - have a low carb dinner the night prior  Radiology Information sheet also reviewed and given to patient for reinforcement of education.  Oncology Nurse Navigator Documentation     08/02/2024   10:30 AM  Oncology Nurse Navigator Flowsheets  Navigator Follow Up Date: 08/24/2024  Navigator Follow Up Reason: Scan Review  Navigator Location [facility]  Navigator Encounter Type Treatment  Patient Visit Type MedOnc  Treatment Phase Active Tx  Barriers/Navigation Needs Coordination of Care  Interventions Coordination of Care;Education  Acuity Level 1-No Barriers  Coordination of Care Radiology  Education Method Verbal;Written  Support Groups/Services Friends and Family  Time Spent with Patient 30

## 2024-08-03 ENCOUNTER — Other Ambulatory Visit: Payer: Self-pay

## 2024-08-03 ENCOUNTER — Other Ambulatory Visit: Payer: Self-pay | Admitting: Hematology & Oncology

## 2024-08-04 ENCOUNTER — Other Ambulatory Visit: Payer: Self-pay

## 2024-08-04 ENCOUNTER — Telehealth: Payer: Self-pay

## 2024-08-04 NOTE — Telephone Encounter (Signed)
 Clinical Social Work was referred by medical provider for assessment of psychosocial needs.  CSW attempted to contact patient by phone.  Left voicemail with contact information and request for return call.

## 2024-08-14 ENCOUNTER — Ambulatory Visit: Admitting: Radiation Oncology

## 2024-08-17 ENCOUNTER — Telehealth: Payer: Self-pay | Admitting: *Deleted

## 2024-08-17 NOTE — Telephone Encounter (Signed)
 Called patient to inform that [facility] will be calling her to set up MRI, lvm for a return call

## 2024-08-21 ENCOUNTER — Ambulatory Visit: Admitting: Gastroenterology

## 2024-08-23 ENCOUNTER — Other Ambulatory Visit

## 2024-08-23 ENCOUNTER — Inpatient Hospital Stay: Payer: Self-pay

## 2024-08-23 ENCOUNTER — Ambulatory Visit

## 2024-08-23 ENCOUNTER — Ambulatory Visit: Admitting: Hematology & Oncology

## 2024-08-24 ENCOUNTER — Ambulatory Visit: Payer: Self-pay | Admitting: Hematology & Oncology

## 2024-08-24 ENCOUNTER — Encounter (HOSPITAL_COMMUNITY)
Admission: RE | Admit: 2024-08-24 | Discharge: 2024-08-24 | Disposition: A | Discharge status: Home / Self Care | Source: Ambulatory Visit | Attending: Hematology & Oncology | Admitting: Hematology & Oncology

## 2024-08-24 DIAGNOSIS — C3411 Malignant neoplasm of upper lobe, right bronchus or lung: Secondary | ICD-10-CM | POA: Diagnosis present

## 2024-08-24 LAB — GLUCOSE, CAPILLARY: Glucose-Capillary: 106 mg/dL — ABNORMAL HIGH (ref 70–99)

## 2024-08-24 MED ORDER — FLUDEOXYGLUCOSE F - 18 (FDG) INJECTION
7.8000 | Freq: Once | INTRAVENOUS | Status: AC
  Administered 2024-08-24: 7.8 via INTRAVENOUS

## 2024-08-24 MED ORDER — HEPARIN SOD (PORK) LOCK FLUSH 100 UNIT/ML IV SOLN
500.0000 [IU] | Freq: Once | INTRAVENOUS | Status: AC
  Administered 2024-08-24: 500 [IU] via INTRAVENOUS
  Filled 2024-08-24: qty 5

## 2024-08-25 ENCOUNTER — Encounter: Payer: Self-pay | Admitting: *Deleted

## 2024-08-25 NOTE — Progress Notes (Signed)
 Reviewed PET which shows no evidence of malignancy.   Oncology Nurse Navigator Documentation     08/25/2024    8:00 AM  Oncology Nurse Navigator Flowsheets  Navigator Follow Up Date: 08/30/2024  Navigator Follow Up Reason: Follow-up Appointment  Navigator Location [facility]  Navigator Encounter Type Scan Review  Patient Visit Type MedOnc  Treatment Phase Active Tx  Barriers/Navigation Needs No Barriers At This Time  Interventions None Required  Acuity Level 1-No Barriers  Support Groups/Services Friends and Family  Time Spent with Patient 15

## 2024-08-26 ENCOUNTER — Other Ambulatory Visit: Payer: Self-pay | Admitting: Hematology & Oncology

## 2024-08-28 ENCOUNTER — Encounter: Payer: Self-pay | Admitting: Hematology & Oncology

## 2024-08-29 ENCOUNTER — Telehealth: Payer: Self-pay

## 2024-08-29 NOTE — Telephone Encounter (Signed)
 Copied from CRM [id number]. Topic: Appointments - Transfer of Care >> Aug 29, 2024 11:00 AM Antony RAMAN wrote: Pt is requesting to transfer FROM: Tammy Cordova Pt is requesting to transfer TO: Jenkins Carrel Reason for requested transfer: pt request It is the responsibility of the team the patient would like to transfer to (Dr. Carrel) to reach out to the patient if for any reason this transfer is not acceptable.  Please see pt request for TOC and advise if willing to take on as New Pt

## 2024-08-30 ENCOUNTER — Encounter: Payer: Self-pay | Admitting: Hematology & Oncology

## 2024-08-30 ENCOUNTER — Inpatient Hospital Stay

## 2024-08-30 ENCOUNTER — Inpatient Hospital Stay: Attending: Hematology & Oncology

## 2024-08-30 ENCOUNTER — Inpatient Hospital Stay (HOSPITAL_BASED_OUTPATIENT_CLINIC_OR_DEPARTMENT_OTHER): Admitting: Hematology & Oncology

## 2024-08-30 VITALS — BP 130/86 | HR 74 | Temp 98.1°F | Resp 20 | Ht 64.0 in | Wt 160.0 lb

## 2024-08-30 VITALS — BP 152/78 | HR 75 | Resp 20

## 2024-08-30 DIAGNOSIS — Z9221 Personal history of antineoplastic chemotherapy: Secondary | ICD-10-CM | POA: Diagnosis not present

## 2024-08-30 DIAGNOSIS — C3490 Malignant neoplasm of unspecified part of unspecified bronchus or lung: Secondary | ICD-10-CM | POA: Diagnosis present

## 2024-08-30 DIAGNOSIS — Z5112 Encounter for antineoplastic immunotherapy: Secondary | ICD-10-CM | POA: Insufficient documentation

## 2024-08-30 DIAGNOSIS — Z79899 Other long term (current) drug therapy: Secondary | ICD-10-CM | POA: Diagnosis not present

## 2024-08-30 DIAGNOSIS — C3411 Malignant neoplasm of upper lobe, right bronchus or lung: Secondary | ICD-10-CM | POA: Diagnosis not present

## 2024-08-30 DIAGNOSIS — Z923 Personal history of irradiation: Secondary | ICD-10-CM | POA: Insufficient documentation

## 2024-08-30 LAB — CBC WITH DIFFERENTIAL (CANCER CENTER ONLY)
Abs Immature Granulocytes: 0.02 K/uL (ref 0.00–0.07)
Basophils Absolute: 0 K/uL (ref 0.0–0.1)
Basophils Relative: 0 %
Eosinophils Absolute: 0.1 K/uL (ref 0.0–0.5)
Eosinophils Relative: 2 %
HCT: 36.1 % (ref 36.0–46.0)
Hemoglobin: 11.8 g/dL — ABNORMAL LOW (ref 12.0–15.0)
Immature Granulocytes: 0 %
Lymphocytes Relative: 19 %
Lymphs Abs: 0.9 K/uL (ref 0.7–4.0)
MCH: 29.1 pg (ref 26.0–34.0)
MCHC: 32.7 g/dL (ref 30.0–36.0)
MCV: 88.9 fL (ref 80.0–100.0)
Monocytes Absolute: 0.3 K/uL (ref 0.1–1.0)
Monocytes Relative: 6 %
Neutro Abs: 3.5 K/uL (ref 1.7–7.7)
Neutrophils Relative %: 73 %
Platelet Count: 243 K/uL (ref 150–400)
RBC: 4.06 MIL/uL (ref 3.87–5.11)
RDW: 14.8 % (ref 11.5–15.5)
WBC Count: 4.8 K/uL (ref 4.0–10.5)
nRBC: 0 % (ref 0.0–0.2)

## 2024-08-30 LAB — LACTATE DEHYDROGENASE: LDH: 156 U/L (ref 98–192)

## 2024-08-30 LAB — CMP (CANCER CENTER ONLY)
ALT: <5 U/L (ref 0–44)
AST: 11 U/L — ABNORMAL LOW (ref 15–41)
Albumin: 4 g/dL (ref 3.5–5.0)
Alkaline Phosphatase: 80 U/L (ref 38–126)
Anion gap: 11 (ref 5–15)
BUN: 11 mg/dL (ref 6–20)
CO2: 24 mmol/L (ref 22–32)
Calcium: 9.2 mg/dL (ref 8.9–10.3)
Chloride: 106 mmol/L (ref 98–111)
Creatinine: 0.66 mg/dL (ref 0.44–1.00)
GFR, Estimated: >60 mL/min (ref >60)
Glucose, Bld: 97 mg/dL (ref 70–99)
Potassium: 3.6 mmol/L (ref 3.5–5.1)
Sodium: 142 mmol/L (ref 135–145)
Total Bilirubin: 0.5 mg/dL (ref 0.0–1.2)
Total Protein: 8 g/dL (ref 6.5–8.1)

## 2024-08-30 LAB — TSH: TSH: 1.3 u[IU]/mL (ref 0.350–4.500)

## 2024-08-30 MED ORDER — SODIUM CHLORIDE 0.9 % IV SOLN: INTRAVENOUS | Status: DC

## 2024-08-30 MED ORDER — SODIUM CHLORIDE 0.9 % IV SOLN
1200.0000 mg | Freq: Once | INTRAVENOUS | Status: AC
  Administered 2024-08-30: 1200 mg via INTRAVENOUS
  Filled 2024-08-30: qty 20

## 2024-08-30 NOTE — Patient Instructions (Signed)
 [facility] - A DEPT OF [facility]  Discharge Instructions: Thank you for choosing [facility] to provide your oncology and hematology care.   If you have a lab appointment with the Cancer Center, please go directly to the Cancer Center and check in at the registration area.  Wear comfortable clothing and clothing appropriate for easy access to any Portacath or PICC line.   We strive to give you quality time with your provider. You may need to reschedule your appointment if you arrive late (15 or more minutes).  Arriving late affects you and other patients whose appointments are after yours.  Also, if you miss three or more appointments without notifying the office, you may be dismissed from the clinic at the provider's discretion.      For prescription refill requests, have your pharmacy contact our office and allow 72 hours for refills to be completed.    Today you received the following chemotherapy and/or immunotherapy agents Tecentriq      To help prevent nausea and vomiting after your treatment, we encourage you to take your nausea medication as directed.  BELOW ARE SYMPTOMS THAT SHOULD BE REPORTED IMMEDIATELY: *FEVER GREATER THAN 100.4 F (38 C) OR HIGHER *CHILLS OR SWEATING *NAUSEA AND VOMITING THAT IS NOT CONTROLLED WITH YOUR NAUSEA MEDICATION *UNUSUAL SHORTNESS OF BREATH *UNUSUAL BRUISING OR BLEEDING *URINARY PROBLEMS (pain or burning when urinating, or frequent urination) *BOWEL PROBLEMS (unusual diarrhea, constipation, pain near the anus) TENDERNESS IN MOUTH AND THROAT WITH OR WITHOUT PRESENCE OF ULCERS (sore throat, sores in mouth, or a toothache) UNUSUAL RASH, SWELLING OR PAIN  UNUSUAL VAGINAL DISCHARGE OR ITCHING   Items with * indicate a potential emergency and should be followed up as soon as possible or go to the Emergency Department if any problems should occur.  Please show the CHEMOTHERAPY ALERT CARD or IMMUNOTHERAPY  ALERT CARD at check-in to the Emergency Department and triage nurse. Should you have questions after your visit or need to cancel or reschedule your appointment, please contact [facility] - A DEPT OF [facility]  [phone] and follow the prompts.  Office hours are 8:00 a.m. to 4:30 p.m. Monday - Friday. Please note that voicemails left after 4:00 p.m. may not be returned until the following business day.  We are closed weekends and major holidays. You have access to a nurse at all times for urgent questions. Please call the main number to the clinic [phone] and follow the prompts.  For any non-urgent questions, you may also contact your provider using MyChart. We now offer e-Visits for anyone 74 and older to request care online for non-urgent symptoms. For details visit [email or web address].   Also download the MyChart app! Go to the app store, search "MyChart", open the app, select [place], and log in with your MyChart username and password.

## 2024-08-30 NOTE — Patient Instructions (Signed)

## 2024-08-30 NOTE — Progress Notes (Signed)
 ScreeningSo 1027 the patient Hematology and Oncology Follow Up Visit  JALONI SORBER [id number] [birth year] 58 y.o. 08/30/2024   Principle Diagnosis:  Extensive stage small cell lung cancer --TMB=23  Current Therapy:   Status post cycle 1 of carboplatinum/etoposide  + XRT s/p cycle #4 -- start on 12/04/2023 Tecentriq  - maintenance s/p cycle #7 -- start on 03/16/2024 PCI -start on 04/10/2024 -completed on 04/27/2024     Interim History:  Ms. Deyo is back for follow-up.  She really looks fantastic.  She really has no complaints.  She is eating well.  There is no problems with cough or shortness of breath.  There is no chest wall pain.  She has had no nausea or vomiting.  There is been no diarrhea.    Of note, her last TSH was 1.8.  This was back in July.  She did have a PET scan done.  This was done on 08/24/2024.  Thankfully, the PET scan did not show any obvious active disease.  She really has not done in a very nice job.  In the right lung apex, a pleural nodule now measures 2 x 1.4 cm.  Again, there is no metabolic activity.  She has had no problem with headaches or blurred vision.  I think she goes for MRI of the brain again.  Currently, I would have to say that her performance status is probably ECOG 0.    Medications:  Current Outpatient Medications:    albuterol  (VENTOLIN  HFA) 108 (90 Base) MCG/ACT inhaler, Inhale 2 puffs into the lungs every 6 (six) hours as needed for wheezing or shortness of breath., Disp: , Rfl:    ALPRAZolam  (XANAX ) 0.5 MG tablet, Take 1 tablet (0.5 mg total) by mouth daily. Please take half hour before radiation therapy., Disp: 21 tablet, Rfl: 0   folic acid  (FOLVITE ) 1 MG tablet, Take 1 tablet (1 mg total) by mouth daily., Disp: 30 tablet, Rfl: 3   HYDROcodone  bit-homatropine (HYCODAN) 5-1.5 MG/5ML syrup, Take 5 mLs by mouth every 6 (six) hours as needed for cough., Disp: 90 mL, Rfl: 0   levofloxacin  (LEVAQUIN ) 500 MG tablet, Take 500 mg by mouth daily.,  Disp: , Rfl:    ondansetron  (ZOFRAN ) 8 MG tablet, Take 1 tablet (8 mg total) by mouth every 8 (eight) hours as needed for nausea or vomiting. Start on third day after Carboplatin , Disp: 30 tablet, Rfl: 1   pantoprazole  (PROTONIX ) 40 MG tablet, Take 1 tablet (40 mg total) by mouth 2 (two) times daily., Disp: 60 tablet, Rfl: 5   prochlorperazine  (COMPAZINE ) 10 MG tablet, TAKE 1 TABLET BY MOUTH EVERY 6 HOURS AS NEEDED FOR NAUSEA FOR VOMITING, Disp: 30 tablet, Rfl: 0   SYMBICORT  160-4.5 MCG/ACT inhaler, Inhale 2 puffs into the lungs 4 (four) times daily as needed., Disp: , Rfl:    valACYclovir (VALTREX) 500 MG tablet, Take 500 mg by mouth 2 (two) times daily. (Patient taking differently: Take 500 mg by mouth 2 (two) times daily as needed.), Disp: , Rfl:    Vitamin D , Ergocalciferol , (DRISDOL ) 1.25 MG (50000 UNIT) CAPS capsule, Take 1 capsule by mouth once a week, Disp: 4 capsule, Rfl: 0   dexamethasone  (DECADRON ) 4 MG tablet, Take 2 tablets (8 mg total) by mouth daily. (Patient not taking: Reported on 08/30/2024), Disp: 60 tablet, Rfl: 4   dronabinol  ([name] ) 2.5 MG capsule, Take 1 capsule (2.5 mg total) by mouth 2 (two) times daily before lunch and supper. (Patient not taking: Reported on 08/30/2024), Disp:  180 capsule, Rfl: 2   lidocaine  (XYLOCAINE ) 2 % solution, Use as directed 15 mLs in the mouth or throat every 6 (six) hours as needed for mouth pain. Please take 15 cc (1 tablespoon) half hour before you try to eat. (Patient not taking: Reported on 08/30/2024), Disp: 300 mL, Rfl: 4  Allergies: No Known Allergies  Past Medical History, Surgical history, Social history, and Family History were reviewed and updated.  Review of Systems: Review of Systems  Constitutional:  Positive for appetite change and unexpected weight change.  HENT:   Positive for sore throat.   Eyes: Negative.   Respiratory: Negative.    Cardiovascular: Negative.   Gastrointestinal:  Positive for nausea.  Endocrine: Negative.    Genitourinary: Negative.    Musculoskeletal: Negative.   Skin: Negative.   Neurological:  Positive for dizziness.  Hematological: Negative.   Psychiatric/Behavioral: Negative.      Physical Exam: Vital signs show temperature of 98.1.  Pulse 74.  Blood pressure 130/86.  Weight is 160 pounds.    Wt Readings from Last 3 Encounters:  08/30/24 160 lb 0.2 oz (72.6 kg)  08/02/24 156 lb 1.9 oz (70.8 kg)  07/12/24 148 lb (67.1 kg)    Physical Exam Vitals reviewed.  HENT:     Head: Normocephalic and atraumatic.  Eyes:     Pupils: Pupils are equal, round, and reactive to light.  Cardiovascular:     Rate and Rhythm: Normal rate and regular rhythm.     Heart sounds: Normal heart sounds.  Pulmonary:     Effort: Pulmonary effort is normal.     Breath sounds: Normal breath sounds.  Abdominal:     General: Bowel sounds are normal.     Palpations: Abdomen is soft.  Musculoskeletal:        General: No tenderness or deformity. Normal range of motion.     Cervical back: Normal range of motion.  Lymphadenopathy:     Cervical: No cervical adenopathy.  Skin:    General: Skin is warm and dry.     Findings: No erythema or rash.  Neurological:     Mental Status: She is alert and oriented to person, place, and time.  Psychiatric:        Behavior: Behavior normal.        Thought Content: Thought content normal.        Judgment: Judgment normal.      Lab Results  Component Value Date   WBC 4.8 08/30/2024   HGB 11.8 (L) 08/30/2024   HCT 36.1 08/30/2024   MCV 88.9 08/30/2024   PLT 243 08/30/2024     Chemistry      Component Value Date/Time   NA 142 08/30/2024 1211   K 3.6 08/30/2024 1211   CL 106 08/30/2024 1211   CO2 24 08/30/2024 1211   BUN 11 08/30/2024 1211   CREATININE 0.66 08/30/2024 1211      Component Value Date/Time   CALCIUM  9.2 08/30/2024 1211   ALKPHOS 80 08/30/2024 1211   AST 11 (L) 08/30/2024 1211   ALT <5 08/30/2024 1211   BILITOT 0.5 08/30/2024 1211        Impression and Plan: Ms. Nikkel is a very charming 58 year old Afro-American female with extensive stage small cell lung cancer.  Because of the bulk of her mediastinal disease, we went ahead and gave radiation with chemotherapy.  Again, she had a very nice response by the last PET scan.  She had PCI.  I think this  has certainly helped her out.  She currently is on maintenance Tecentriq .  We will go ahead with her 8th cycle today.   Again, I am very happy with the results of the PET scan.  She is doing well on the maintenance Tecentriq .  Will plan to go ahead and get her back in another 3 weeks.     Maude JONELLE Crease, MD 10/1/20251:19 PM

## 2024-08-31 ENCOUNTER — Other Ambulatory Visit: Payer: Self-pay | Admitting: Hematology & Oncology

## 2024-08-31 ENCOUNTER — Encounter: Payer: Self-pay | Admitting: *Deleted

## 2024-08-31 NOTE — Progress Notes (Signed)
 Patient is doing well on maintenance. She has not had navigational needs for some time. Will discontinue active navigation at this time, but be available to the patient as needed in the future.   Oncology Nurse Navigator Documentation     08/31/2024    9:00 AM  Oncology Nurse Navigator Flowsheets  Navigation Complete Date: 08/31/2024  Post Navigation: Continue to Follow Patient? No  Reason Not Navigating Patient: Patient On Maintenance Chemotherapy  Navigator Location [facility]  Navigator Encounter Type Appt/Treatment Plan Review  Time Spent with Patient 15

## 2024-09-04 ENCOUNTER — Ambulatory Visit: Admitting: Radiation Oncology

## 2024-09-10 ENCOUNTER — Other Ambulatory Visit: Payer: Self-pay | Admitting: Hematology & Oncology

## 2024-09-13 ENCOUNTER — Ambulatory Visit: Admitting: Gastroenterology

## 2024-09-13 ENCOUNTER — Encounter: Payer: Self-pay | Admitting: Gastroenterology

## 2024-09-13 ENCOUNTER — Encounter: Payer: Self-pay | Admitting: Hematology & Oncology

## 2024-09-13 VITALS — BP 130/76 | HR 86 | Ht 64.0 in | Wt 158.8 lb

## 2024-09-13 DIAGNOSIS — R1319 Other dysphagia: Secondary | ICD-10-CM

## 2024-09-13 DIAGNOSIS — K3189 Other diseases of stomach and duodenum: Secondary | ICD-10-CM | POA: Diagnosis not present

## 2024-09-13 DIAGNOSIS — K219 Gastro-esophageal reflux disease without esophagitis: Secondary | ICD-10-CM

## 2024-09-13 DIAGNOSIS — Z923 Personal history of irradiation: Secondary | ICD-10-CM | POA: Diagnosis not present

## 2024-09-13 DIAGNOSIS — K222 Esophageal obstruction: Secondary | ICD-10-CM

## 2024-09-13 NOTE — Patient Instructions (Addendum)
 Recommend GERD diet Continue Pantoprazole  40 mg twice daily If swallowing issues reoccur please call office.   _______________________________________________________  If your blood pressure at your visit was 140/90 or greater, please contact your primary care physician to follow up on this.  _______________________________________________________  If you are age 57 or older, your body mass index should be between 23-30. Your Body mass index is 27.26 kg/m. If this is out of the aforementioned range listed, please consider follow up with your Primary Care Provider.  If you are age 53 or younger, your body mass index should be between 19-25. Your Body mass index is 27.26 kg/m. If this is out of the aformentioned range listed, please consider follow up with your Primary Care Provider.   ________________________________________________________  The [facility] providers would like to encourage you to use MYCHART to communicate with providers for non-urgent requests or questions.  Due to long hold times on the telephone, sending your provider a message by [facility] may be a faster and more efficient way to get a response.  Please allow 48 business hours for a response.  Please remember that this is for non-urgent requests.  _______________________________________________________  [facility] is using a team-based approach to care.  Your team is made up of your doctor and two to three APPS. Our APPS (Nurse Practitioners and Physician Assistants) work with your physician to ensure care continuity for you. They are fully qualified to address your health concerns and develop a treatment plan. They communicate directly with your gastroenterologist to care for you. Seeing the Advanced Practice Practitioners on your physician's team can help you by facilitating care more promptly, often allowing for earlier appointments, access to diagnostic testing, procedures, and other specialty referrals.

## 2024-09-13 NOTE — Progress Notes (Signed)
 Chief Complaint: follow-up GERD, dysphagia Primary GI Doctor: Dr. San  HPI:  Patient is a  58  year old female patient with past medical history of small cell lung cancer s/p chemotherapy plus XRT in 12/2023 now on maintenance Tecentriq , COPD, GERD, rheumatoid arthritis,who presents for follow-up on GERD and dysphagia.  GI workup: --Barium esophagram (05/15/2024): normal motility, persistent coating of barium in the cervical esophagus with questionable diverticulum, tiny hiatal hernia, 13 mm barium tablet would not pass beyond distal esophagus at the GE junction with slight narrowing. --PET/CT on 6/5 without abnormal uptake in the GI tract.  Further reduction in size and uptake of right apical mass lesion, prominent iliac lymph nodes similar to prior.   --MRI brain on 6/23 without intracranial metastatic lesions.  --labs from 05/24/2024 with essentially stable CBC and normal CMP as below. --EGD (06/13/24) :Benign- appearing esophageal stenoses. Dilated with 18 mm TTS balloon with appropriate mucosal disruption. This was then biopsied for histology and for further fracturing of the rings.Radiation stricture in the proximal esophagus. Dilated with 15 mm TTS balloon with several appropriate mucosal disruptions. Biopsied. Z- line regular, 40 cm from the incisors. A single submucosal papule ( nodule) found in the stomach. Biopsied. Normal mucosa was found in the entire stomach. Normal examined duodenum. BX:The gastric nodule that was biopsied demonstrated benign, chronic, inactive gastritis (inflammation). No intestinal metaplasia, dysplasia, Helicobacter pylori infection, or other concerning findings. The stricture in the lower esophagus that was biopsied was benign. The stricture in the upper esophagus demonstrates reactive epithelial changes which can be seen with radiation injury. No evidence of infection or other concerning features.   Interval History Patient last seen in GI office on 06/01/24  by Dr. San for evaluation of dysphagia and GERD.     Patient presents for follow-up on EGD with dilatation for dysphagia. Patient has history of GERD and taking Pantoprazole  40 mg twice daily. She reports since the EGD her dysphagia has resolved. Patient is feeling well. No complaints.    Wt Readings from Last 3 Encounters:  09/13/24 158 lb 12.8 oz (72 kg)  08/30/24 160 lb 0.2 oz (72.6 kg)  08/02/24 156 lb 1.9 oz (70.8 kg)     Past Medical History:  Diagnosis Date   Asthma    COPD (chronic obstructive pulmonary disease) (HCC)    GERD (gastroesophageal reflux disease)    Lung cancer (HCC)    Pneumonia    RA (rheumatoid arthritis) (HCC)    Vitamin D  deficiency     Past Surgical History:  Procedure Laterality Date   BREAST BIOPSY Left 04/2016   REACTIVE LYMPHOID HYPERPLASIA    BUNIONECTOMY Bilateral    bilateral   IR IMAGING GUIDED PORT INSERTION  12/03/2023    Current Outpatient Medications  Medication Sig Dispense Refill   albuterol  (VENTOLIN  HFA) 108 (90 Base) MCG/ACT inhaler Inhale 2 puffs into the lungs every 6 (six) hours as needed for wheezing or shortness of breath.     ALPRAZolam  (XANAX ) 0.5 MG tablet Take 1 tablet (0.5 mg total) by mouth daily. Please take half hour before radiation therapy. 21 tablet 0   folic acid  (FOLVITE ) 1 MG tablet Take 1 tablet by mouth once daily 30 tablet 0   levofloxacin  (LEVAQUIN ) 500 MG tablet Take 500 mg by mouth daily.     ondansetron  (ZOFRAN ) 8 MG tablet Take 1 tablet (8 mg total) by mouth every 8 (eight) hours as needed for nausea or vomiting. Start on third day after Carboplatin  30  tablet 1   pantoprazole  (PROTONIX ) 40 MG tablet Take 1 tablet (40 mg total) by mouth 2 (two) times daily. 60 tablet 5   prochlorperazine  (COMPAZINE ) 10 MG tablet TAKE 1 TABLET BY MOUTH EVERY 6 HOURS AS NEEDED FOR NAUSEA FOR VOMITING 30 tablet 0   SYMBICORT  160-4.5 MCG/ACT inhaler Inhale 2 puffs into the lungs 4 (four) times daily as needed.      valACYclovir (VALTREX) 500 MG tablet Take 500 mg by mouth 2 (two) times daily. (Patient taking differently: Take 500 mg by mouth 2 (two) times daily as needed.)     Vitamin D , Ergocalciferol , (DRISDOL ) 1.25 MG (50000 UNIT) CAPS capsule Take 1 capsule by mouth once a week 4 capsule 0   No current facility-administered medications for this visit.    Allergies as of 09/13/2024   (No Known Allergies)    Family History  Problem Relation Age of Onset   Diabetes Mother    Hypertension Mother    Diabetes Father    Hypertension Father    Prostate cancer Father    Hypertension Brother    Diabetes Brother    Stroke Brother    Breast cancer Paternal Grandmother 65   Cancer Paternal Grandfather        type unknown    Review of Systems:    Constitutional: No weight loss, fever, chills, weakness or fatigue HEENT: Eyes: No change in vision               Ears, Nose, Throat:  No change in hearing or congestion Skin: No rash or itching Cardiovascular: No chest pain, chest pressure or palpitations   Respiratory: No SOB or cough Gastrointestinal: See HPI and otherwise negative Genitourinary: No dysuria or change in urinary frequency Neurological: No headache, dizziness or syncope Musculoskeletal: No new muscle or joint pain Hematologic: No bleeding or bruising Psychiatric: No history of depression or anxiety    Physical Exam:  Vital signs: BP 130/76   Pulse 86   Ht 5' 4 (1.626 m)   Wt 158 lb 12.8 oz (72 kg)   LMP  (LMP Unknown)   BMI 27.26 kg/m   Constitutional:   Pleasant female appears to be in NAD, Well developed, Well nourished, alert and cooperative Throat: Oral cavity and pharynx without inflammation, swelling or lesion.  Respiratory: Respirations even and unlabored. Lungs clear to auscultation bilaterally.   No wheezes, crackles, or rhonchi.  Cardiovascular: Normal S1, S2. Regular rate and rhythm. No peripheral edema, cyanosis or pallor.  Gastrointestinal:  Soft,  nondistended, nontender. No rebound or guarding. Normal bowel sounds. No appreciable masses or hepatomegaly. Rectal:  Not performed.  Msk:  Symmetrical without gross deformities. Without edema, no deformity or joint abnormality.  Neurologic:  Alert and  oriented x4;  grossly normal neurologically.  Skin:   Dry and intact without significant lesions or rashes.  RELEVANT LABS AND IMAGING: CBC    Latest Ref Rng & Units 08/30/2024   12:11 PM 08/02/2024   10:34 AM 07/12/2024   11:08 AM  CBC  WBC 4.0 - 10.5 K/uL 4.8  2.8  5.4   Hemoglobin 12.0 - 15.0 g/dL 88.1  87.2  87.9   Hematocrit 36.0 - 46.0 % 36.1  39.0  37.1   Platelets 150 - 400 K/uL 243  269  228      CMP     Latest Ref Rng & Units 08/30/2024   12:11 PM 08/02/2024   10:34 AM 07/12/2024   11:08 AM  CMP  Glucose 70 - 99 mg/dL 97  886  882   BUN 6 - 20 mg/dL 11  13  12    Creatinine 0.44 - 1.00 mg/dL 9.33  9.27  9.29   Sodium 135 - 145 mmol/L 142  140  141   Potassium 3.5 - 5.1 mmol/L 3.6  3.9  3.6   Chloride 98 - 111 mmol/L 106  106  108   CO2 22 - 32 mmol/L 24  24  23    Calcium  8.9 - 10.3 mg/dL 9.2  9.7  9.0   Total Protein 6.5 - 8.1 g/dL 8.0  8.2  7.6   Total Bilirubin 0.0 - 1.2 mg/dL 0.5  0.3  0.3   Alkaline Phos 38 - 126 U/L 80  89  69   AST 15 - 41 U/L 11  12  12    ALT 0 - 44 U/L <5  6  <5      Lab Results  Component Value Date   TSH 1.300 08/30/2024     Assessment: Encounter Diagnoses  Name Primary?   Esophageal dysphagia Yes   Radiation-induced esophageal stricture    Gastroesophageal reflux disease without esophagitis    Gastric nodule    1) Dysphagia- resolved 2) History of radiation, induced stricture 3) GERD 4.) gastric nodule, benign 58 year old female with prior history of GERD which had been well-controlled with Protonix  40 mg daily, post EGD with dilatation (7/25) for dysphagia. Symptoms resolved.  - Repeat upper endoscopy PRN for retreatment. -Recommend a repeat upper endoscopy at 1 year to  reevaluate gastric nodule. -follow up in 1 year with Dr. San  5.) Small cell lung cancer - Follow-up with Dr. Timmy in the Oncology Clinic as scheduled    Thank you for the courtesy of this consult. Please call me with any questions or concerns.   Shanterica Biehler, FNP-C [facility] 09/13/2024, 9:16 AM  Cc: Vernon Velna SAUNDERS, MD

## 2024-09-14 ENCOUNTER — Encounter: Payer: Self-pay | Admitting: Hematology & Oncology

## 2024-09-18 NOTE — Telephone Encounter (Signed)
 Please disregard message as pt is coming from [facility], not [facility]. Patient is scheduled for new patient appointment, not TOC.

## 2024-09-19 ENCOUNTER — Telehealth: Payer: Self-pay | Admitting: *Deleted

## 2024-09-19 NOTE — Telephone Encounter (Signed)
Called patient to ask about rescheduling missed scan, lvm for a return call °

## 2024-09-20 ENCOUNTER — Inpatient Hospital Stay

## 2024-09-20 ENCOUNTER — Inpatient Hospital Stay (HOSPITAL_BASED_OUTPATIENT_CLINIC_OR_DEPARTMENT_OTHER): Admitting: Medical Oncology

## 2024-09-20 ENCOUNTER — Encounter: Payer: Self-pay | Admitting: Medical Oncology

## 2024-09-20 VITALS — BP 104/57 | HR 77 | Resp 18

## 2024-09-20 VITALS — BP 122/61 | HR 94 | Temp 99.0°F | Resp 18 | Wt 166.0 lb

## 2024-09-20 DIAGNOSIS — C3411 Malignant neoplasm of upper lobe, right bronchus or lung: Secondary | ICD-10-CM

## 2024-09-20 DIAGNOSIS — Z5112 Encounter for antineoplastic immunotherapy: Secondary | ICD-10-CM

## 2024-09-20 DIAGNOSIS — Z95828 Presence of other vascular implants and grafts: Secondary | ICD-10-CM | POA: Diagnosis not present

## 2024-09-20 LAB — CBC WITH DIFFERENTIAL (CANCER CENTER ONLY)
Abs Immature Granulocytes: 0.02 K/uL (ref 0.00–0.07)
Basophils Absolute: 0 K/uL (ref 0.0–0.1)
Basophils Relative: 1 %
Eosinophils Absolute: 0.3 K/uL (ref 0.0–0.5)
Eosinophils Relative: 8 %
HCT: 36.5 % (ref 36.0–46.0)
Hemoglobin: 11.6 g/dL — ABNORMAL LOW (ref 12.0–15.0)
Immature Granulocytes: 1 %
Lymphocytes Relative: 19 %
Lymphs Abs: 0.6 K/uL — ABNORMAL LOW (ref 0.7–4.0)
MCH: 29.4 pg (ref 26.0–34.0)
MCHC: 31.8 g/dL (ref 30.0–36.0)
MCV: 92.6 fL (ref 80.0–100.0)
Monocytes Absolute: 0.2 K/uL (ref 0.1–1.0)
Monocytes Relative: 6 %
Neutro Abs: 2.2 K/uL (ref 1.7–7.7)
Neutrophils Relative %: 65 %
Platelet Count: 243 K/uL (ref 150–400)
RBC: 3.94 MIL/uL (ref 3.87–5.11)
RDW: 15.7 % — ABNORMAL HIGH (ref 11.5–15.5)
WBC Count: 3.3 K/uL — ABNORMAL LOW (ref 4.0–10.5)
nRBC: 0 % (ref 0.0–0.2)

## 2024-09-20 LAB — CMP (CANCER CENTER ONLY)
ALT: 8 U/L (ref 0–44)
AST: 18 U/L (ref 15–41)
Albumin: 3.6 g/dL (ref 3.5–5.0)
Alkaline Phosphatase: 81 U/L (ref 38–126)
Anion gap: 10 (ref 5–15)
BUN: 15 mg/dL (ref 6–20)
CO2: 23 mmol/L (ref 22–32)
Calcium: 8.7 mg/dL — ABNORMAL LOW (ref 8.9–10.3)
Chloride: 110 mmol/L (ref 98–111)
Creatinine: 0.83 mg/dL (ref 0.44–1.00)
GFR, Estimated: >60 mL/min (ref >60)
Glucose, Bld: 118 mg/dL — ABNORMAL HIGH (ref 70–99)
Potassium: 4.3 mmol/L (ref 3.5–5.1)
Sodium: 142 mmol/L (ref 135–145)
Total Bilirubin: 0.2 mg/dL (ref 0.0–1.2)
Total Protein: 7.2 g/dL (ref 6.5–8.1)

## 2024-09-20 LAB — TSH: TSH: 0.771 u[IU]/mL (ref 0.350–4.500)

## 2024-09-20 MED ORDER — SODIUM CHLORIDE 0.9 % IV SOLN
1200.0000 mg | Freq: Once | INTRAVENOUS | Status: AC
  Administered 2024-09-20: 1200 mg via INTRAVENOUS
  Filled 2024-09-20: qty 20

## 2024-09-20 MED ORDER — SODIUM CHLORIDE 0.9 % IV SOLN: INTRAVENOUS | Status: DC

## 2024-09-20 NOTE — Patient Instructions (Signed)

## 2024-09-20 NOTE — Progress Notes (Signed)
 Hematology and Oncology Follow Up Visit  Tammy Cordova [id number] [birth year] 58 y.o. 09/20/2024   Principle Diagnosis:  Extensive stage small cell lung cancer --TMB=23  Current Therapy:   Status post cycle 1 of carboplatinum/etoposide  + XRT s/p cycle #4 -- start on 12/04/2023 Tecentriq  - maintenance s/p cycle #8 -- start on 03/16/2024 PCI -start on 04/10/2024 -completed on 04/27/2024     Interim History:  Tammy Cordova is back for follow-up and consideration of treatment.   She is doing well. Her only concerns is that she is gaining weight. Appetite is good and she reports eating plenty of food.   Of note, her last TSH was 1.3.  This was back in October  PET scan performed on  08/24/2024 did not show any obvious active disease.  She really has not done in a very nice job.  In the right lung apex, a pleural nodule now measures 2 x 1.4 cm.  Again, there is no metabolic activity.  She has had no problem with headaches or blurred vision. There is no problems with cough or shortness of breath.  There is no chest wall pain.  She has had no nausea or vomiting.  There is been no diarrhea.  No rash.   Currently, I would have to say that her performance status is probably ECOG 0.   Wt Readings from Last 3 Encounters:  09/20/24 166 lb (75.3 kg)  09/13/24 158 lb 12.8 oz (72 kg)  08/30/24 160 lb 0.2 oz (72.6 kg)     Medications:  Current Outpatient Medications:    albuterol  (VENTOLIN  HFA) 108 (90 Base) MCG/ACT inhaler, Inhale 2 puffs into the lungs every 6 (six) hours as needed for wheezing or shortness of breath., Disp: , Rfl:    ALPRAZolam  (XANAX ) 0.5 MG tablet, Take 1 tablet (0.5 mg total) by mouth daily. Please take half hour before radiation therapy., Disp: 21 tablet, Rfl: 0   dronabinol  ([name] ) 2.5 MG capsule, Take 2.5 mg by mouth 2 (two) times daily before lunch and supper., Disp: , Rfl:    folic acid  (FOLVITE ) 1 MG tablet, Take 1 tablet by mouth once daily, Disp: 30 tablet, Rfl: 0    levofloxacin  (LEVAQUIN ) 500 MG tablet, Take 500 mg by mouth daily., Disp: , Rfl:    ondansetron  (ZOFRAN ) 8 MG tablet, Take 1 tablet (8 mg total) by mouth every 8 (eight) hours as needed for nausea or vomiting. Start on third day after Carboplatin , Disp: 30 tablet, Rfl: 1   pantoprazole  (PROTONIX ) 40 MG tablet, Take 1 tablet (40 mg total) by mouth 2 (two) times daily., Disp: 60 tablet, Rfl: 5   prochlorperazine  (COMPAZINE ) 10 MG tablet, TAKE 1 TABLET BY MOUTH EVERY 6 HOURS AS NEEDED FOR NAUSEA FOR VOMITING, Disp: 30 tablet, Rfl: 0   SYMBICORT  160-4.5 MCG/ACT inhaler, Inhale 2 puffs into the lungs 4 (four) times daily as needed., Disp: , Rfl:    valACYclovir (VALTREX) 500 MG tablet, Take 500 mg by mouth 2 (two) times daily. (Patient taking differently: Take 500 mg by mouth 2 (two) times daily as needed.), Disp: , Rfl:    Vitamin D , Ergocalciferol , (DRISDOL ) 1.25 MG (50000 UNIT) CAPS capsule, Take 1 capsule by mouth once a week, Disp: 4 capsule, Rfl: 0  Allergies: No Known Allergies  Past Medical History, Surgical history, Social history, and Family History were reviewed and updated.  Review of Systems: Review of Systems  Constitutional:  Negative for appetite change and unexpected weight change.  HENT:  Negative for sore throat.   Eyes: Negative.   Respiratory: Negative.    Cardiovascular: Negative.   Gastrointestinal:  Negative for nausea.  Endocrine: Negative.   Genitourinary: Negative.    Musculoskeletal: Negative.   Skin: Negative.   Neurological:  Negative for dizziness.  Hematological: Negative.   Psychiatric/Behavioral: Negative.      Physical Exam: Vitals:   09/20/24 1018  BP: 122/61  Pulse: 94  Resp: 18  Temp: 99 F (37.2 C)  SpO2: 94%     Wt Readings from Last 3 Encounters:  09/20/24 166 lb (75.3 kg)  09/13/24 158 lb 12.8 oz (72 kg)  08/30/24 160 lb 0.2 oz (72.6 kg)    Physical Exam Vitals reviewed.  HENT:     Head: Normocephalic and atraumatic.  Eyes:      Pupils: Pupils are equal, round, and reactive to light.  Cardiovascular:     Rate and Rhythm: Normal rate and regular rhythm.     Heart sounds: Normal heart sounds.  Pulmonary:     Effort: Pulmonary effort is normal.     Breath sounds: Normal breath sounds.  Abdominal:     General: Bowel sounds are normal.     Palpations: Abdomen is soft.  Musculoskeletal:        General: No tenderness or deformity. Normal range of motion.     Cervical back: Normal range of motion.  Lymphadenopathy:     Cervical: No cervical adenopathy.  Skin:    General: Skin is warm and dry.     Findings: No erythema or rash.  Neurological:     Mental Status: She is alert and oriented to person, place, and time.  Psychiatric:        Behavior: Behavior normal.        Thought Content: Thought content normal.        Judgment: Judgment normal.      Lab Results  Component Value Date   WBC 3.3 (L) 09/20/2024   HGB 11.6 (L) 09/20/2024   HCT 36.5 09/20/2024   MCV 92.6 09/20/2024   PLT 243 09/20/2024     Chemistry      Component Value Date/Time   NA 142 08/30/2024 1211   K 3.6 08/30/2024 1211   CL 106 08/30/2024 1211   CO2 24 08/30/2024 1211   BUN 11 08/30/2024 1211   CREATININE 0.66 08/30/2024 1211      Component Value Date/Time   CALCIUM  9.2 08/30/2024 1211   ALKPHOS 80 08/30/2024 1211   AST 11 (L) 08/30/2024 1211   ALT <5 08/30/2024 1211   BILITOT 0.5 08/30/2024 1211      No diagnosis found.  Impression and Plan: Tammy Cordova is a very charming 58 year old Afro-American female with extensive stage small cell lung cancer.  Because of the bulk of her mediastinal disease, we went ahead and gave radiation with chemotherapy.  Again, she had a very nice response by the last PET scan.  She had PCI.  I think this has certainly helped her out.  She currently is on maintenance Tecentriq .  We will go ahead with her 9th cycle today as long as CMP is within parameters. CBC is stable.   Tecentriq   today RTC 3 weeks MD, port labs, Infusion- Tecentriq   Tammy [name] Dais, PA-C 10/22/202510:28 AM

## 2024-09-20 NOTE — Patient Instructions (Signed)
 [facility] - A DEPT OF [facility]  Discharge Instructions: Thank you for choosing [facility] to provide your oncology and hematology care.   If you have a lab appointment with the Cancer Center, please go directly to the Cancer Center and check in at the registration area.  Wear comfortable clothing and clothing appropriate for easy access to any Portacath or PICC line.   We strive to give you quality time with your provider. You may need to reschedule your appointment if you arrive late (15 or more minutes).  Arriving late affects you and other patients whose appointments are after yours.  Also, if you miss three or more appointments without notifying the office, you may be dismissed from the clinic at the provider's discretion.      For prescription refill requests, have your pharmacy contact our office and allow 72 hours for refills to be completed.    Today you received the following chemotherapy and/or immunotherapy agents Tecentriq      To help prevent nausea and vomiting after your treatment, we encourage you to take your nausea medication as directed.  BELOW ARE SYMPTOMS THAT SHOULD BE REPORTED IMMEDIATELY: *FEVER GREATER THAN 100.4 F (38 C) OR HIGHER *CHILLS OR SWEATING *NAUSEA AND VOMITING THAT IS NOT CONTROLLED WITH YOUR NAUSEA MEDICATION *UNUSUAL SHORTNESS OF BREATH *UNUSUAL BRUISING OR BLEEDING *URINARY PROBLEMS (pain or burning when urinating, or frequent urination) *BOWEL PROBLEMS (unusual diarrhea, constipation, pain near the anus) TENDERNESS IN MOUTH AND THROAT WITH OR WITHOUT PRESENCE OF ULCERS (sore throat, sores in mouth, or a toothache) UNUSUAL RASH, SWELLING OR PAIN  UNUSUAL VAGINAL DISCHARGE OR ITCHING   Items with * indicate a potential emergency and should be followed up as soon as possible or go to the Emergency Department if any problems should occur.  Please show the CHEMOTHERAPY ALERT CARD or IMMUNOTHERAPY  ALERT CARD at check-in to the Emergency Department and triage nurse. Should you have questions after your visit or need to cancel or reschedule your appointment, please contact [facility] - A DEPT OF [facility]  [phone] and follow the prompts.  Office hours are 8:00 a.m. to 4:30 p.m. Monday - Friday. Please note that voicemails left after 4:00 p.m. may not be returned until the following business day.  We are closed weekends and major holidays. You have access to a nurse at all times for urgent questions. Please call the main number to the clinic [phone] and follow the prompts.  For any non-urgent questions, you may also contact your provider using MyChart. We now offer e-Visits for anyone 74 and older to request care online for non-urgent symptoms. For details visit [email or web address].   Also download the MyChart app! Go to the app store, search "MyChart", open the app, select [place], and log in with your MyChart username and password.

## 2024-09-22 ENCOUNTER — Other Ambulatory Visit: Payer: Self-pay | Admitting: Hematology & Oncology

## 2024-09-26 ENCOUNTER — Ambulatory Visit: Admitting: Family Medicine

## 2024-10-03 ENCOUNTER — Other Ambulatory Visit: Payer: Self-pay | Admitting: Hematology & Oncology

## 2024-10-12 ENCOUNTER — Inpatient Hospital Stay: Attending: Hematology & Oncology

## 2024-10-12 ENCOUNTER — Inpatient Hospital Stay (HOSPITAL_BASED_OUTPATIENT_CLINIC_OR_DEPARTMENT_OTHER): Admitting: Hematology & Oncology

## 2024-10-12 ENCOUNTER — Inpatient Hospital Stay

## 2024-10-12 ENCOUNTER — Other Ambulatory Visit: Payer: Self-pay

## 2024-10-12 ENCOUNTER — Encounter: Payer: Self-pay | Admitting: Hematology & Oncology

## 2024-10-12 VITALS — BP 136/49 | HR 89 | Temp 99.3°F | Resp 18 | Ht 64.0 in | Wt 162.0 lb

## 2024-10-12 VITALS — BP 121/77 | HR 64 | Resp 17

## 2024-10-12 DIAGNOSIS — L299 Pruritus, unspecified: Secondary | ICD-10-CM | POA: Diagnosis not present

## 2024-10-12 DIAGNOSIS — Z9221 Personal history of antineoplastic chemotherapy: Secondary | ICD-10-CM | POA: Insufficient documentation

## 2024-10-12 DIAGNOSIS — C3490 Malignant neoplasm of unspecified part of unspecified bronchus or lung: Secondary | ICD-10-CM | POA: Diagnosis present

## 2024-10-12 DIAGNOSIS — Z5112 Encounter for antineoplastic immunotherapy: Secondary | ICD-10-CM | POA: Diagnosis present

## 2024-10-12 DIAGNOSIS — Z95828 Presence of other vascular implants and grafts: Secondary | ICD-10-CM

## 2024-10-12 DIAGNOSIS — C3411 Malignant neoplasm of upper lobe, right bronchus or lung: Secondary | ICD-10-CM

## 2024-10-12 DIAGNOSIS — Z79899 Other long term (current) drug therapy: Secondary | ICD-10-CM | POA: Diagnosis not present

## 2024-10-12 LAB — CBC WITH DIFFERENTIAL (CANCER CENTER ONLY)
Abs Immature Granulocytes: 0.02 K/uL (ref 0.00–0.07)
Basophils Absolute: 0 K/uL (ref 0.0–0.1)
Basophils Relative: 0 %
Eosinophils Absolute: 0.2 K/uL (ref 0.0–0.5)
Eosinophils Relative: 4 %
HCT: 38.1 % (ref 36.0–46.0)
Hemoglobin: 12.4 g/dL (ref 12.0–15.0)
Immature Granulocytes: 1 %
Lymphocytes Relative: 15 %
Lymphs Abs: 0.7 K/uL (ref 0.7–4.0)
MCH: 29.2 pg (ref 26.0–34.0)
MCHC: 32.5 g/dL (ref 30.0–36.0)
MCV: 89.9 fL (ref 80.0–100.0)
Monocytes Absolute: 0.2 K/uL (ref 0.1–1.0)
Monocytes Relative: 5 %
Neutro Abs: 3.2 K/uL (ref 1.7–7.7)
Neutrophils Relative %: 75 %
Platelet Count: 244 K/uL (ref 150–400)
RBC: 4.24 MIL/uL (ref 3.87–5.11)
RDW: 15.7 % — ABNORMAL HIGH (ref 11.5–15.5)
WBC Count: 4.4 K/uL (ref 4.0–10.5)
nRBC: 0 % (ref 0.0–0.2)

## 2024-10-12 LAB — CMP (CANCER CENTER ONLY)
ALT: <5 U/L (ref 0–44)
AST: 12 U/L — ABNORMAL LOW (ref 15–41)
Albumin: 4 g/dL (ref 3.5–5.0)
Alkaline Phosphatase: 80 U/L (ref 38–126)
Anion gap: 11 (ref 5–15)
BUN: 11 mg/dL (ref 6–20)
CO2: 23 mmol/L (ref 22–32)
Calcium: 9.1 mg/dL (ref 8.9–10.3)
Chloride: 106 mmol/L (ref 98–111)
Creatinine: 0.78 mg/dL (ref 0.44–1.00)
GFR, Estimated: >60 mL/min (ref >60)
Glucose, Bld: 111 mg/dL — ABNORMAL HIGH (ref 70–99)
Potassium: 4 mmol/L (ref 3.5–5.1)
Sodium: 140 mmol/L (ref 135–145)
Total Bilirubin: 0.6 mg/dL (ref 0.0–1.2)
Total Protein: 7.8 g/dL (ref 6.5–8.1)

## 2024-10-12 LAB — TSH: TSH: 2.68 u[IU]/mL (ref 0.350–4.500)

## 2024-10-12 LAB — T4, FREE: Free T4: 0.92 ng/dL (ref 0.61–1.12)

## 2024-10-12 MED ORDER — SODIUM CHLORIDE 0.9 % IV SOLN: INTRAVENOUS | Status: DC

## 2024-10-12 MED ORDER — FAMOTIDINE 40 MG PO TABS: 40.0000 mg | ORAL_TABLET | Freq: Two times a day (BID) | ORAL | 4 refills | Status: DC

## 2024-10-12 MED ORDER — SODIUM CHLORIDE 0.9 % IV SOLN
1200.0000 mg | Freq: Once | INTRAVENOUS | Status: AC
  Administered 2024-10-12: 1200 mg via INTRAVENOUS
  Filled 2024-10-12: qty 20

## 2024-10-12 NOTE — Progress Notes (Signed)
 Hematology and Oncology Follow Up Visit  Tammy Cordova [id number] 1966/06/13 58 y.o. 10/12/2024   Principle Diagnosis:  Extensive stage small cell lung cancer --TMB=23  Current Therapy:   Status post cycle 1 of carboplatinum/etoposide  + XRT s/p cycle #4 -- start on 12/04/2023 Tecentriq  - maintenance s/p cycle #9 -- start on 03/16/2024 PCI -start on 04/10/2024 -completed on 04/27/2024     Interim History:  Tammy Cordova is back for follow-up and treatment.  Her main problem has been pruritus.  This certainly could be from the Tecentriq .  I will try her on Pepcid .  I will send in Pepcid  40 mg p.o. twice daily to see if this may help with some of the pruritus.  If this does not help, then we may have to think about a different immunotherapy agent.  Otherwise, she feels okay.  She is eating well.  She is having no problems with nausea or vomiting.  She is having no problems with cough.  There is no change in bowel or bladder habits.    She has had no bleeding.  She has had no headache.  I think her last PET scan was probably back in September.  We may have to get another 1 on her in December.  She has had no issues with her thyroid .  When we last saw her, TSH was 0.77.  Overall, I would have to say that her performance status is probably ECOG 1.  She is doing well. Her only concerns is that she is gaining weight. Appetite is good and she reports eating plenty of food.   Of note, her last TSH was 1.3.  This was back in October    Wt Readings from Last 3 Encounters:  10/12/24 162 lb (73.5 kg)  09/20/24 166 lb (75.3 kg)  09/13/24 158 lb 12.8 oz (72 kg)     Medications:  Current Outpatient Medications:    albuterol  (VENTOLIN  HFA) 108 (90 Base) MCG/ACT inhaler, Inhale 2 puffs into the lungs every 6 (six) hours as needed for wheezing or shortness of breath., Disp: , Rfl:    ALPRAZolam  (XANAX ) 0.5 MG tablet, Take 1 tablet (0.5 mg total) by mouth daily. Please take half hour before  radiation therapy., Disp: 21 tablet, Rfl: 0   dronabinol  ([name] ) 2.5 MG capsule, Take 2.5 mg by mouth 2 (two) times daily before lunch and supper., Disp: , Rfl:    folic acid  (FOLVITE ) 1 MG tablet, Take 1 tablet by mouth once daily, Disp: 30 tablet, Rfl: 0   levofloxacin  (LEVAQUIN ) 500 MG tablet, Take 500 mg by mouth daily., Disp: , Rfl:    ondansetron  (ZOFRAN ) 8 MG tablet, Take 1 tablet (8 mg total) by mouth every 8 (eight) hours as needed for nausea or vomiting. Start on third day after Carboplatin , Disp: 30 tablet, Rfl: 1   pantoprazole  (PROTONIX ) 40 MG tablet, Take 1 tablet (40 mg total) by mouth 2 (two) times daily., Disp: 60 tablet, Rfl: 5   prochlorperazine  (COMPAZINE ) 10 MG tablet, TAKE 1 TABLET BY MOUTH EVERY 6 HOURS AS NEEDED FOR NAUSEA FOR VOMITING, Disp: 30 tablet, Rfl: 0   SYMBICORT  160-4.5 MCG/ACT inhaler, Inhale 2 puffs into the lungs 4 (four) times daily as needed., Disp: , Rfl:    valACYclovir (VALTREX) 500 MG tablet, Take 500 mg by mouth 2 (two) times daily. (Patient taking differently: Take 500 mg by mouth 2 (two) times daily as needed.), Disp: , Rfl:    Vitamin D , Ergocalciferol , (DRISDOL ) 1.25 MG (50000  UNIT) CAPS capsule, Take 1 capsule by mouth once a week, Disp: 4 capsule, Rfl: 0  Allergies: No Known Allergies  Past Medical History, Surgical history, Social history, and Family History were reviewed and updated.  Review of Systems: Review of Systems  Constitutional:  Negative for appetite change and unexpected weight change.  HENT:   Negative for sore throat.   Eyes: Negative.   Respiratory: Negative.    Cardiovascular: Negative.   Gastrointestinal:  Negative for nausea.  Endocrine: Negative.   Genitourinary: Negative.    Musculoskeletal: Negative.   Skin: Negative.   Neurological:  Negative for dizziness.  Hematological: Negative.   Psychiatric/Behavioral: Negative.      Physical Exam: Vitals:   10/12/24 1120  BP: (!) 136/49  Pulse: 89  Resp: 18  Temp:  99.3 F (37.4 C)  SpO2: 94%     Wt Readings from Last 3 Encounters:  10/12/24 162 lb (73.5 kg)  09/20/24 166 lb (75.3 kg)  09/13/24 158 lb 12.8 oz (72 kg)    Physical Exam Vitals reviewed.  HENT:     Head: Normocephalic and atraumatic.  Eyes:     Pupils: Pupils are equal, round, and reactive to light.  Cardiovascular:     Rate and Rhythm: Normal rate and regular rhythm.     Heart sounds: Normal heart sounds.  Pulmonary:     Effort: Pulmonary effort is normal.     Breath sounds: Normal breath sounds.  Abdominal:     General: Bowel sounds are normal.     Palpations: Abdomen is soft.  Musculoskeletal:        General: No tenderness or deformity. Normal range of motion.     Cervical back: Normal range of motion.  Lymphadenopathy:     Cervical: No cervical adenopathy.  Skin:    General: Skin is warm and dry.     Findings: No erythema or rash.  Neurological:     Mental Status: She is alert and oriented to person, place, and time.  Psychiatric:        Behavior: Behavior normal.        Thought Content: Thought content normal.        Judgment: Judgment normal.      Lab Results  Component Value Date   WBC 4.4 10/12/2024   HGB 12.4 10/12/2024   HCT 38.1 10/12/2024   MCV 89.9 10/12/2024   PLT 244 10/12/2024     Chemistry      Component Value Date/Time   NA 140 10/12/2024 1020   K 4.0 10/12/2024 1020   CL 106 10/12/2024 1020   CO2 23 10/12/2024 1020   BUN 11 10/12/2024 1020   CREATININE 0.78 10/12/2024 1020      Component Value Date/Time   CALCIUM  9.1 10/12/2024 1020   ALKPHOS 80 10/12/2024 1020   AST 12 (L) 10/12/2024 1020   ALT <5 10/12/2024 1020   BILITOT 0.6 10/12/2024 1020      Impression and Plan: Tammy Cordova is a very charming 58 year old Afro-American female with extensive stage small cell lung cancer.  Because of the bulk of her mediastinal disease, we went ahead and gave radiation with chemotherapy.  Again, she had a very nice response by the last  PET scan.  She had PCI.  I think this has certainly helped her out.  We will see about another PET scan.  We will set this up for 3 weeks.  I will have her come back in 4 weeks.  I think  a better thing looks good, then we can try to slowly move her appointments out.  Hopefully, the Pepcid  will help with the pruritus.  If not, then we may have to think about a different immunotherapy agent.    I will see her back in 1 month.     Maude JONELLE Crease, MD 11/13/202511:39 AM

## 2024-10-12 NOTE — Patient Instructions (Signed)

## 2024-10-12 NOTE — Patient Instructions (Signed)
 [facility] - A DEPT OF [facility]  Discharge Instructions: Thank you for choosing [facility] to provide your oncology and hematology care.   If you have a lab appointment with the Cancer Center, please go directly to the Cancer Center and check in at the registration area.  Wear comfortable clothing and clothing appropriate for easy access to any Portacath or PICC line.   We strive to give you quality time with your provider. You may need to reschedule your appointment if you arrive late (15 or more minutes).  Arriving late affects you and other patients whose appointments are after yours.  Also, if you miss three or more appointments without notifying the office, you may be dismissed from the clinic at the provider's discretion.      For prescription refill requests, have your pharmacy contact our office and allow 72 hours for refills to be completed.    Today you received the following chemotherapy and/or immunotherapy agents Tecentriq      To help prevent nausea and vomiting after your treatment, we encourage you to take your nausea medication as directed.  BELOW ARE SYMPTOMS THAT SHOULD BE REPORTED IMMEDIATELY: *FEVER GREATER THAN 100.4 F (38 C) OR HIGHER *CHILLS OR SWEATING *NAUSEA AND VOMITING THAT IS NOT CONTROLLED WITH YOUR NAUSEA MEDICATION *UNUSUAL SHORTNESS OF BREATH *UNUSUAL BRUISING OR BLEEDING *URINARY PROBLEMS (pain or burning when urinating, or frequent urination) *BOWEL PROBLEMS (unusual diarrhea, constipation, pain near the anus) TENDERNESS IN MOUTH AND THROAT WITH OR WITHOUT PRESENCE OF ULCERS (sore throat, sores in mouth, or a toothache) UNUSUAL RASH, SWELLING OR PAIN  UNUSUAL VAGINAL DISCHARGE OR ITCHING   Items with * indicate a potential emergency and should be followed up as soon as possible or go to the Emergency Department if any problems should occur.  Please show the CHEMOTHERAPY ALERT CARD or IMMUNOTHERAPY  ALERT CARD at check-in to the Emergency Department and triage nurse. Should you have questions after your visit or need to cancel or reschedule your appointment, please contact [facility] - A DEPT OF [facility]  [phone] and follow the prompts.  Office hours are 8:00 a.m. to 4:30 p.m. Monday - Friday. Please note that voicemails left after 4:00 p.m. may not be returned until the following business day.  We are closed weekends and major holidays. You have access to a nurse at all times for urgent questions. Please call the main number to the clinic [phone] and follow the prompts.  For any non-urgent questions, you may also contact your provider using MyChart. We now offer e-Visits for anyone 74 and older to request care online for non-urgent symptoms. For details visit [email or web address].   Also download the MyChart app! Go to the app store, search "MyChart", open the app, select [place], and log in with your MyChart username and password.

## 2024-10-15 ENCOUNTER — Other Ambulatory Visit: Payer: Self-pay | Admitting: Hematology & Oncology

## 2024-10-15 DIAGNOSIS — C3411 Malignant neoplasm of upper lobe, right bronchus or lung: Secondary | ICD-10-CM

## 2024-10-16 ENCOUNTER — Encounter: Payer: Self-pay | Admitting: Hematology & Oncology

## 2024-10-18 ENCOUNTER — Encounter: Payer: Self-pay | Admitting: *Deleted

## 2024-10-18 ENCOUNTER — Other Ambulatory Visit: Payer: Self-pay | Admitting: Hematology & Oncology

## 2024-10-18 DIAGNOSIS — C3411 Malignant neoplasm of upper lobe, right bronchus or lung: Secondary | ICD-10-CM

## 2024-10-18 NOTE — Progress Notes (Deleted)
 Patient in chemotherapy/immunotherapy education class prior to infusion with 2 daughters and her husband. Discussed side effects of Keytruda  which include but are not limited to myelosuppression, fatigue, fever, allergic or infusional reaction, cough, SOB, nausea and vomiting, diarrhea, dark or tarry stools, abdominal pain, yellowing of the skin, dark urine, elevated LFTs, myalgia and arthralgias, rash,  mental changes (confusion, changes in mood) , increased risk of infections, weight loss.  Reviewed infusion room and office policy and procedure and phone numbers 24 hours x 7 days a week.   .  Reviewed when to call the office with any concerns or problems.  Transport planner given.  Antiemetic protocol and chemotherapy/immunotherapy schedule reviewed. Patient verbalized understanding of chemotherapy/immunotherapy indications and possible side effects.  Teachback done

## 2024-10-18 NOTE — Progress Notes (Signed)
 Incorrect encounter

## 2024-10-30 ENCOUNTER — Encounter (HOSPITAL_COMMUNITY)

## 2024-10-31 ENCOUNTER — Encounter (HOSPITAL_BASED_OUTPATIENT_CLINIC_OR_DEPARTMENT_OTHER): Payer: Self-pay

## 2024-10-31 ENCOUNTER — Ambulatory Visit (HOSPITAL_BASED_OUTPATIENT_CLINIC_OR_DEPARTMENT_OTHER)
Admission: RE | Admit: 2024-10-31 | Discharge: 2024-10-31 | Disposition: A | Discharge status: Home / Self Care | Source: Ambulatory Visit | Attending: Hematology & Oncology | Admitting: Hematology & Oncology

## 2024-10-31 ENCOUNTER — Inpatient Hospital Stay: Attending: Hematology & Oncology

## 2024-10-31 DIAGNOSIS — Z79899 Other long term (current) drug therapy: Secondary | ICD-10-CM | POA: Diagnosis not present

## 2024-10-31 DIAGNOSIS — C3411 Malignant neoplasm of upper lobe, right bronchus or lung: Secondary | ICD-10-CM | POA: Insufficient documentation

## 2024-10-31 DIAGNOSIS — Z9221 Personal history of antineoplastic chemotherapy: Secondary | ICD-10-CM | POA: Diagnosis not present

## 2024-10-31 DIAGNOSIS — C3490 Malignant neoplasm of unspecified part of unspecified bronchus or lung: Secondary | ICD-10-CM | POA: Diagnosis present

## 2024-10-31 DIAGNOSIS — Z5112 Encounter for antineoplastic immunotherapy: Secondary | ICD-10-CM | POA: Diagnosis present

## 2024-10-31 MED ORDER — IOHEXOL 300 MG/ML  SOLN
100.0000 mL | Freq: Once | INTRAMUSCULAR | Status: AC | PRN
  Administered 2024-10-31: 100 mL via INTRAVENOUS

## 2024-11-01 ENCOUNTER — Other Ambulatory Visit: Payer: Self-pay | Admitting: Hematology & Oncology

## 2024-11-03 ENCOUNTER — Telehealth: Payer: Self-pay | Admitting: Gastroenterology

## 2024-11-03 DIAGNOSIS — K219 Gastro-esophageal reflux disease without esophagitis: Secondary | ICD-10-CM

## 2024-11-03 DIAGNOSIS — R1319 Other dysphagia: Secondary | ICD-10-CM

## 2024-11-03 NOTE — Telephone Encounter (Signed)
 Spoke with patient & she feels she may need to repeat EGD. Last EGD 05/2024 with Dr. San. Patient is having reflux & dysphagia occasionally. For the most part, she can still tolerate swallowing food/liquid she just has to be very cautious. She's taking pantoprazole  40 mg BID & pepcid  40 mg BID. Seen for OV 08/2024 with Deanna & was advised to repeat EGD PRN. Recall is also in for July 2026.

## 2024-11-03 NOTE — Telephone Encounter (Signed)
 Scheduled EGD w/dil for 12/08/24 at 9:00 am in the [facility] with Dr. San. Amb ref placed & instructions sent to pt. Pt is not currently on blood thinners or diabetic medications/GLP1. She had no further questions at end of call & verbalized all understanding.

## 2024-11-03 NOTE — Telephone Encounter (Signed)
 Inbound call from patient stating she had an endoscopy 05/2024 and feels like he hasn't helped due to experiencing same issues she's had before procedure. Patient would like to speak to nurse and be advised on what to do.  Requesting a call back  Please advise  Thank you

## 2024-11-03 NOTE — Telephone Encounter (Signed)
 Left message for patient to call back

## 2024-11-06 ENCOUNTER — Ambulatory Visit: Payer: Self-pay | Admitting: Hematology & Oncology

## 2024-11-08 ENCOUNTER — Other Ambulatory Visit: Payer: Self-pay

## 2024-11-08 ENCOUNTER — Inpatient Hospital Stay

## 2024-11-08 ENCOUNTER — Inpatient Hospital Stay (HOSPITAL_BASED_OUTPATIENT_CLINIC_OR_DEPARTMENT_OTHER): Admitting: Hematology & Oncology

## 2024-11-08 ENCOUNTER — Encounter: Payer: Self-pay | Admitting: Hematology & Oncology

## 2024-11-08 VITALS — BP 140/77 | HR 73 | Temp 98.3°F | Resp 18

## 2024-11-08 DIAGNOSIS — C3411 Malignant neoplasm of upper lobe, right bronchus or lung: Secondary | ICD-10-CM | POA: Diagnosis not present

## 2024-11-08 DIAGNOSIS — Z5112 Encounter for antineoplastic immunotherapy: Secondary | ICD-10-CM | POA: Diagnosis not present

## 2024-11-08 LAB — CMP (CANCER CENTER ONLY)
ALT: <5 U/L (ref 0–44)
AST: 11 U/L — ABNORMAL LOW (ref 15–41)
Albumin: 3.8 g/dL (ref 3.5–5.0)
Alkaline Phosphatase: 79 U/L (ref 38–126)
Anion gap: 10 (ref 5–15)
BUN: 6 mg/dL (ref 6–20)
CO2: 25 mmol/L (ref 22–32)
Calcium: 8.8 mg/dL — ABNORMAL LOW (ref 8.9–10.3)
Chloride: 106 mmol/L (ref 98–111)
Creatinine: 0.64 mg/dL (ref 0.44–1.00)
GFR, Estimated: >60 mL/min (ref >60)
Glucose, Bld: 107 mg/dL — ABNORMAL HIGH (ref 70–99)
Potassium: 3.6 mmol/L (ref 3.5–5.1)
Sodium: 141 mmol/L (ref 135–145)
Total Bilirubin: 0.4 mg/dL (ref 0.0–1.2)
Total Protein: 7.6 g/dL (ref 6.5–8.1)

## 2024-11-08 LAB — CBC WITH DIFFERENTIAL (CANCER CENTER ONLY)
Abs Immature Granulocytes: 0.02 K/uL (ref 0.00–0.07)
Basophils Absolute: 0 K/uL (ref 0.0–0.1)
Basophils Relative: 0 %
Eosinophils Absolute: 0.3 K/uL (ref 0.0–0.5)
Eosinophils Relative: 8 %
HCT: 37 % (ref 36.0–46.0)
Hemoglobin: 12 g/dL (ref 12.0–15.0)
Immature Granulocytes: 1 %
Lymphocytes Relative: 18 %
Lymphs Abs: 0.7 K/uL (ref 0.7–4.0)
MCH: 29.1 pg (ref 26.0–34.0)
MCHC: 32.4 g/dL (ref 30.0–36.0)
MCV: 89.8 fL (ref 80.0–100.0)
Monocytes Absolute: 0.1 K/uL (ref 0.1–1.0)
Monocytes Relative: 4 %
Neutro Abs: 2.5 K/uL (ref 1.7–7.7)
Neutrophils Relative %: 69 %
Platelet Count: 226 K/uL (ref 150–400)
RBC: 4.12 MIL/uL (ref 3.87–5.11)
RDW: 15 % (ref 11.5–15.5)
WBC Count: 3.6 K/uL — ABNORMAL LOW (ref 4.0–10.5)
nRBC: 0 % (ref 0.0–0.2)

## 2024-11-08 LAB — LACTATE DEHYDROGENASE: LDH: 167 U/L (ref 105–235)

## 2024-11-08 LAB — TSH: TSH: 2.07 u[IU]/mL (ref 0.350–4.500)

## 2024-11-08 MED ORDER — SODIUM CHLORIDE 0.9 % IV SOLN
1200.0000 mg | Freq: Once | INTRAVENOUS | Status: AC
  Administered 2024-11-08: 1200 mg via INTRAVENOUS
  Filled 2024-11-08: qty 20

## 2024-11-08 MED ORDER — SODIUM CHLORIDE 0.9 % IV SOLN: INTRAVENOUS | Status: DC

## 2024-11-08 MED ORDER — SYMBICORT 160-4.5 MCG/ACT IN AERO: 2.0000 | INHALATION_SPRAY | Freq: Four times a day (QID) | RESPIRATORY_TRACT | 3 refills | Status: DC | PRN

## 2024-11-08 NOTE — Patient Instructions (Signed)
 [facility] - A DEPT OF [facility]  Discharge Instructions: Thank you for choosing [facility] to provide your oncology and hematology care.   If you have a lab appointment with the Cancer Center, please go directly to the Cancer Center and check in at the registration area.  Wear comfortable clothing and clothing appropriate for easy access to any Portacath or PICC line.   We strive to give you quality time with your provider. You may need to reschedule your appointment if you arrive late (15 or more minutes).  Arriving late affects you and other patients whose appointments are after yours.  Also, if you miss three or more appointments without notifying the office, you may be dismissed from the clinic at the provider's discretion.      For prescription refill requests, have your pharmacy contact our office and allow 72 hours for refills to be completed.    Today you received the following chemotherapy and/or immunotherapy agents Tecentriq      To help prevent nausea and vomiting after your treatment, we encourage you to take your nausea medication as directed.  BELOW ARE SYMPTOMS THAT SHOULD BE REPORTED IMMEDIATELY: *FEVER GREATER THAN 100.4 F (38 C) OR HIGHER *CHILLS OR SWEATING *NAUSEA AND VOMITING THAT IS NOT CONTROLLED WITH YOUR NAUSEA MEDICATION *UNUSUAL SHORTNESS OF BREATH *UNUSUAL BRUISING OR BLEEDING *URINARY PROBLEMS (pain or burning when urinating, or frequent urination) *BOWEL PROBLEMS (unusual diarrhea, constipation, pain near the anus) TENDERNESS IN MOUTH AND THROAT WITH OR WITHOUT PRESENCE OF ULCERS (sore throat, sores in mouth, or a toothache) UNUSUAL RASH, SWELLING OR PAIN  UNUSUAL VAGINAL DISCHARGE OR ITCHING   Items with * indicate a potential emergency and should be followed up as soon as possible or go to the Emergency Department if any problems should occur.  Please show the CHEMOTHERAPY ALERT CARD or IMMUNOTHERAPY  ALERT CARD at check-in to the Emergency Department and triage nurse. Should you have questions after your visit or need to cancel or reschedule your appointment, please contact [facility] - A DEPT OF [facility]  [phone] and follow the prompts.  Office hours are 8:00 a.m. to 4:30 p.m. Monday - Friday. Please note that voicemails left after 4:00 p.m. may not be returned until the following business day.  We are closed weekends and major holidays. You have access to a nurse at all times for urgent questions. Please call the main number to the clinic [phone] and follow the prompts.  For any non-urgent questions, you may also contact your provider using MyChart. We now offer e-Visits for anyone 74 and older to request care online for non-urgent symptoms. For details visit [email or web address].   Also download the MyChart app! Go to the app store, search "MyChart", open the app, select [place], and log in with your MyChart username and password.

## 2024-11-08 NOTE — Progress Notes (Signed)
 Hematology and Oncology Follow Up Visit  Tammy Cordova [id number] September 07, 1966 58 y.o. 11/08/2024   Principle Diagnosis:  Extensive stage small cell lung cancer --TMB=23  Current Therapy:   Status post cycle 1 of carboplatinum/etoposide  + XRT s/p cycle #4 -- start on 12/04/2023 Tecentriq  - maintenance s/p cycle #9 -- start on 03/16/2024 PCI -start on 04/10/2024 -completed on 04/27/2024     Interim History:  Tammy Cordova is back for follow-up and treatment.  We did do a CT scan on her.  This was done on 10/31/2024.  The CT scan did not show anything that looked like active cancer.  And is very happy for her.  She still does have some problems with itching.  I know we tried her on Pepcid .  This is helped a little bit.  She has had no problems with nausea or vomiting.  She has had no change in bowel or bladder habits.  There has been no leg swelling.  She has had no bleeding.  There has been no cough.  She did have a very nice Thanksgiving with her family.  Of note, her last TSH in November was 2.68.  Currently, I would say perform status is probably ECOG 1.   Wt Readings from Last 3 Encounters:  11/08/24 163 lb (73.9 kg)  10/31/24 167 lb (75.8 kg)  10/12/24 162 lb (73.5 kg)     Medications:  Current Outpatient Medications:    pantoprazole  (PROTONIX ) 40 MG tablet, Take 40 mg by mouth 2 (two) times daily., Disp: , Rfl:    albuterol  (VENTOLIN  HFA) 108 (90 Base) MCG/ACT inhaler, Inhale 2 puffs into the lungs every 6 (six) hours as needed for wheezing or shortness of breath., Disp: , Rfl:    ALPRAZolam  (XANAX ) 0.5 MG tablet, Take 1 tablet (0.5 mg total) by mouth daily. Please take half hour before radiation therapy., Disp: 21 tablet, Rfl: 0   dronabinol  ([name] ) 2.5 MG capsule, TAKE 1 CAPSULE BY MOUTH TWICE DAILY BEFORE  LUNCH  AND  SUPPER, Disp: 60 capsule, Rfl: 0   famotidine  (PEPCID ) 40 MG tablet, Take 1 tablet (40 mg total) by mouth 2 (two) times daily., Disp: 180 tablet, Rfl: 4    folic acid  (FOLVITE ) 1 MG tablet, Take 1 tablet by mouth once daily, Disp: 30 tablet, Rfl: 0   levofloxacin  (LEVAQUIN ) 500 MG tablet, TAKE 1 TABLET BY MOUTH ONCE DAILY *  START  TAKING  LEVAQUIN   DAILY  STARTING  ON  01/01/2024  *, Disp: 15 tablet, Rfl: 0   ondansetron  (ZOFRAN ) 8 MG tablet, Take 1 tablet (8 mg total) by mouth every 8 (eight) hours as needed for nausea or vomiting. Start on third day after Carboplatin , Disp: 30 tablet, Rfl: 1   prochlorperazine  (COMPAZINE ) 10 MG tablet, TAKE 1 TABLET BY MOUTH EVERY 6 HOURS AS NEEDED FOR NAUSEA FOR VOMITING, Disp: 30 tablet, Rfl: 0   SYMBICORT  160-4.5 MCG/ACT inhaler, Inhale 2 puffs into the lungs 4 (four) times daily as needed., Disp: , Rfl:    valACYclovir (VALTREX) 500 MG tablet, Take 500 mg by mouth 2 (two) times daily. (Patient taking differently: Take 500 mg by mouth 2 (two) times daily as needed.), Disp: , Rfl:    Vitamin D , Ergocalciferol , (DRISDOL ) 1.25 MG (50000 UNIT) CAPS capsule, Take 1 capsule by mouth once a week, Disp: 4 capsule, Rfl: 0  Allergies: No Known Allergies  Past Medical History, Surgical history, Social history, and Family History were reviewed and updated.  Review of Systems:  Review of Systems  Constitutional:  Negative for appetite change and unexpected weight change.  HENT:   Negative for sore throat.   Eyes: Negative.   Respiratory: Negative.    Cardiovascular: Negative.   Gastrointestinal:  Negative for nausea.  Endocrine: Negative.   Genitourinary: Negative.    Musculoskeletal: Negative.   Skin: Negative.   Neurological:  Negative for dizziness.  Hematological: Negative.   Psychiatric/Behavioral: Negative.      Physical Exam: Vitals:   11/08/24 1100  BP: (!) 141/73  Pulse: 69  Resp: 18  Temp: 99.1 F (37.3 C)  SpO2: 95%     Wt Readings from Last 3 Encounters:  11/08/24 163 lb (73.9 kg)  10/31/24 167 lb (75.8 kg)  10/12/24 162 lb (73.5 kg)    Physical Exam Vitals reviewed.  HENT:      Head: Normocephalic and atraumatic.  Eyes:     Pupils: Pupils are equal, round, and reactive to light.  Cardiovascular:     Rate and Rhythm: Normal rate and regular rhythm.     Heart sounds: Normal heart sounds.  Pulmonary:     Effort: Pulmonary effort is normal.     Breath sounds: Normal breath sounds.  Abdominal:     General: Bowel sounds are normal.     Palpations: Abdomen is soft.  Musculoskeletal:        General: No tenderness or deformity. Normal range of motion.     Cervical back: Normal range of motion.  Lymphadenopathy:     Cervical: No cervical adenopathy.  Skin:    General: Skin is warm and dry.     Findings: No erythema or rash.  Neurological:     Mental Status: She is alert and oriented to person, place, and time.  Psychiatric:        Behavior: Behavior normal.        Thought Content: Thought content normal.        Judgment: Judgment normal.      Lab Results  Component Value Date   WBC 3.6 (L) 11/08/2024   HGB 12.0 11/08/2024   HCT 37.0 11/08/2024   MCV 89.8 11/08/2024   PLT 226 11/08/2024     Chemistry      Component Value Date/Time   NA 141 11/08/2024 1007   K 3.6 11/08/2024 1007   CL 106 11/08/2024 1007   CO2 25 11/08/2024 1007   BUN 6 11/08/2024 1007   CREATININE 0.64 11/08/2024 1007      Component Value Date/Time   CALCIUM  8.8 (L) 11/08/2024 1007   ALKPHOS 79 11/08/2024 1007   AST 11 (L) 11/08/2024 1007   ALT <5 11/08/2024 1007   BILITOT 0.4 11/08/2024 1007      Impression and Plan: Tammy Cordova is a very charming 58 year old Afro-American female with extensive stage small cell lung cancer.  Because of the bulk of her mediastinal disease, we went ahead and gave radiation with chemotherapy.  Again, she had a very nice response by the last PET scan.  She had PCI.  I think this has certainly helped her out.  So far, everything is looking quite good.  We will continue her on maintenance therapy.  I think she is done well with maintenance  therapy.  We will plan to get her back now after the Christmas holiday.     Tammy JONELLE Crease, MD 12/10/202511:19 AM

## 2024-11-10 ENCOUNTER — Other Ambulatory Visit: Payer: Self-pay

## 2024-11-11 ENCOUNTER — Other Ambulatory Visit: Payer: Self-pay | Admitting: Hematology & Oncology

## 2024-11-13 ENCOUNTER — Encounter: Payer: Self-pay | Admitting: Hematology & Oncology

## 2024-11-17 ENCOUNTER — Emergency Department (HOSPITAL_COMMUNITY)
Admission: EM | Admit: 2024-11-17 | Discharge: 2024-11-17 | Disposition: A | Discharge status: Home / Self Care | Attending: Emergency Medicine | Admitting: Emergency Medicine

## 2024-11-17 ENCOUNTER — Other Ambulatory Visit: Payer: Self-pay

## 2024-11-17 ENCOUNTER — Encounter (HOSPITAL_COMMUNITY): Payer: Self-pay

## 2024-11-17 ENCOUNTER — Encounter: Payer: Self-pay | Admitting: Hematology & Oncology

## 2024-11-17 DIAGNOSIS — Y9289 Other specified places as the place of occurrence of the external cause: Secondary | ICD-10-CM | POA: Insufficient documentation

## 2024-11-17 DIAGNOSIS — J449 Chronic obstructive pulmonary disease, unspecified: Secondary | ICD-10-CM | POA: Insufficient documentation

## 2024-11-17 DIAGNOSIS — W07XXXA Fall from chair, initial encounter: Secondary | ICD-10-CM | POA: Insufficient documentation

## 2024-11-17 DIAGNOSIS — Y93E2 Activity, laundry: Secondary | ICD-10-CM | POA: Diagnosis not present

## 2024-11-17 DIAGNOSIS — S7012XA Contusion of left thigh, initial encounter: Secondary | ICD-10-CM | POA: Diagnosis present

## 2024-11-17 DIAGNOSIS — Z85118 Personal history of other malignant neoplasm of bronchus and lung: Secondary | ICD-10-CM | POA: Insufficient documentation

## 2024-11-17 MED ORDER — IBUPROFEN 600 MG PO TABS: 600.0000 mg | ORAL_TABLET | Freq: Four times a day (QID) | ORAL | 0 refills | Status: AC | PRN

## 2024-11-17 MED ORDER — IBUPROFEN 800 MG PO TABS
800.0000 mg | ORAL_TABLET | Freq: Once | ORAL | Status: AC
  Administered 2024-11-17: 800 mg via ORAL
  Filled 2024-11-17: qty 1

## 2024-11-17 MED ORDER — CYCLOBENZAPRINE HCL 10 MG PO TABS: 10.0000 mg | ORAL_TABLET | Freq: Two times a day (BID) | ORAL | 0 refills | Status: AC | PRN

## 2024-11-17 NOTE — ED Triage Notes (Signed)
 Patient fell off a chair this morning at 10:30am today. Was 2 feet high up. Did not hit her head. Caught herself with her arm. Has lower back pain. And left hip pain.

## 2024-11-17 NOTE — ED Provider Notes (Cosign Needed)
 " [place] [facility] AT [facility] Provider Note   CSN: [id number] Arrival date & time: 11/17/24  1302     Patient presents with: Tammy Cordova SIMRAT KENDRICK is a 58 y.o. female.   The history is provided by the patient and medical records. No language interpreter was used.  Fall     58 year old female with history of rheumatoid arthritis, COPD, lung cancer presenting to the ED for evaluation of a fall.  Patient states this morning she was at the [facility].  She was trying to put her laundry into a machine that is too tall for her reach.  She stood up on a chair but the chair broke causing her to fall to the ground.  She did strike her left hip against the ground at a height of approximately 2 feet above ground.  She did not hit her head and denies any loss of consciousness.  Her husband was able to assist her up.  She is endorsing some tightness to her left lower back and left hip/thigh.  Pain is mild to moderate.  No significant back pain headache neck pain or pain elsewhere.  She is not on any blood thinner medication.  She does not think she has any broken bones.  She was able to catch herself from a fall with her arms and denies any significant arm pain or hand pain.  Prior to Admission medications  Medication Sig Start Date End Date Taking? Authorizing Provider  albuterol  (VENTOLIN  HFA) 108 (90 Base) MCG/ACT inhaler Inhale 2 puffs into the lungs every 6 (six) hours as needed for wheezing or shortness of breath.    [provider]  ALPRAZolam  (XANAX ) 0.5 MG tablet Take 1 tablet (0.5 mg total) by mouth daily. Please take half hour before radiation therapy. 04/05/24   Timmy Maude SAUNDERS, MD  dronabinol  ([name] ) 2.5 MG capsule TAKE 1 CAPSULE BY MOUTH TWICE DAILY BEFORE  LUNCH  AND  SUPPER 10/16/24   Timmy Maude SAUNDERS, MD  famotidine  (PEPCID ) 40 MG tablet Take 1 tablet (40 mg total) by mouth 2 (two) times daily. 10/12/24   Timmy Maude SAUNDERS, MD  folic acid   (FOLVITE ) 1 MG tablet Take 1 tablet by mouth once daily 11/01/24   Ennever, Peter R, MD  levofloxacin  (LEVAQUIN ) 500 MG tablet TAKE 1 TABLET BY MOUTH ONCE DAILY *  START  TAKING  LEVAQUIN   DAILY  STARTING  ON  01/01/2024  * 10/16/24   Timmy Maude SAUNDERS, MD  ondansetron  (ZOFRAN ) 8 MG tablet Take 1 tablet (8 mg total) by mouth every 8 (eight) hours as needed for nausea or vomiting. Start on third day after Carboplatin  12/29/23   Timmy Maude SAUNDERS, MD  pantoprazole  (PROTONIX ) 40 MG tablet Take 40 mg by mouth 2 (two) times daily. 10/15/24   [provider]  prochlorperazine  (COMPAZINE ) 10 MG tablet TAKE 1 TABLET BY MOUTH EVERY 6 HOURS AS NEEDED FOR NAUSEA FOR VOMITING 11/13/24   Timmy Maude SAUNDERS, MD  SYMBICORT  160-4.5 MCG/ACT inhaler Inhale 2 puffs into the lungs 4 (four) times daily as needed. 11/08/24   Timmy Maude SAUNDERS, MD  valACYclovir (VALTREX) 500 MG tablet Take 500 mg by mouth 2 (two) times daily. Patient taking differently: Take 500 mg by mouth 2 (two) times daily as needed.    [provider]  Vitamin D , Ergocalciferol , (DRISDOL ) 1.25 MG (50000 UNIT) CAPS capsule Take 1 capsule by mouth once a week 10/03/24   Timmy Maude SAUNDERS, MD  Allergies: Patient has no known allergies.    Review of Systems  All other systems reviewed and are negative.   Updated Vital Signs BP (!) 168/87   Pulse (!) 105   Temp 98.5 F (36.9 C) (Oral)   Resp 20   Ht 5' 5 (1.651 m)   Wt 73.5 kg   LMP  (LMP Unknown)   SpO2 96%   BMI 26.96 kg/m   Physical Exam Vitals and nursing note reviewed.  Constitutional:      General: She is not in acute distress.    Appearance: She is well-developed.  HENT:     Head: Normocephalic and atraumatic.  Eyes:     Conjunctiva/sclera: Conjunctivae normal.  Cardiovascular:     Rate and Rhythm: Normal rate and regular rhythm.  Pulmonary:     Effort: Pulmonary effort is normal.  Abdominal:     Palpations: Abdomen is soft.     Tenderness: There is no  abdominal tenderness.  Musculoskeletal:        General: Tenderness (Mild tenderness to left lumbar paraspinal muscles and left lateral thigh.  Left hip with full range of motion and nontender to palpation.) present.     Cervical back: Neck supple.  Skin:    Findings: No rash.  Neurological:     Mental Status: She is alert.     Comments: Ambulates with steady gait.  Psychiatric:        Mood and Affect: Mood normal.     (all labs ordered are listed, but only abnormal results are displayed) Labs Reviewed - No data to display  EKG: None  Radiology: No results found.   Procedures   Medications Ordered in the ED  ibuprofen  (ADVIL ) tablet 800 mg (has no administration in time range)                                    Medical Decision Making  BP (!) 168/87   Pulse (!) 105   Temp 98.5 F (36.9 C) (Oral)   Resp 20   Ht 5' 5 (1.651 m)   Wt 73.5 kg   LMP  (LMP Unknown)   SpO2 96%   BMI 26.96 kg/m   61:60 PM  58 year old female with history of rheumatoid arthritis, COPD, lung cancer presenting to the ED for evaluation of a fall.  Patient states this morning she was at the [facility].  She was trying to put her laundry into a machine that is too tall for her reach.  She stood up on a chair but the chair broke causing her to fall to the ground.  She did strike her left hip against the ground at a height of approximately 2 feet above ground.  She did not hit her head and denies any loss of consciousness.  Her husband was able to assist her up.  She is endorsing some tightness to her left lower back and left hip/thigh.  Pain is mild to moderate.  No significant back pain headache neck pain or pain elsewhere.  She is not on any blood thinner medication.  She does not think she has any broken bones.  She was able to catch herself from a fall with her arms and denies any significant arm pain or hand pain.  Exam notable for mild tenderness to lumbar paraspinal muscle and left lateral  thigh without any bruising or deformity.  Left hip with full range of  motion.  No significant midline spine tenderness.  Patient ambulate without difficulty.  Suspect pain is likely from muscle strain and likely contusion.  Advanced imaging including x-rays was considered but not performed as patient and myself agree that it is likely low yield as we have low suspicion for fracture or dislocation.  Will discharge home with anti-inflammatory medication and muscle relaxants to use as needed.  RICE therapy discussed.  Patient voiced understanding and agrees with plan.  Pain improved with ibuprofen .  Social determinant of health including tobacco use and financial difficulty.      Final diagnoses:  Fall from chair, initial encounter  Contusion of left thigh, initial encounter    ED Discharge Orders     None          Nivia Colon, PA-C 11/17/24 1432  "

## 2024-11-20 ENCOUNTER — Telehealth: Payer: Self-pay | Admitting: *Deleted

## 2024-11-20 NOTE — Telephone Encounter (Signed)
 Patient called and wanted to let us  know that she fell over the weekend.  She fell off of a chair at a laundromat and went to the ED.  No Xrays were taken but she is complaining of lower back pain.  Told patient that if this doesn't get better to go back to ED or contact PCP for Xrays to follow up.  Patient has medications from ED to help with discomfort.  Patient states she just wanted Dr Timmy to be aware

## 2024-11-22 ENCOUNTER — Other Ambulatory Visit: Payer: Self-pay

## 2024-11-23 ENCOUNTER — Emergency Department (HOSPITAL_COMMUNITY)

## 2024-11-23 ENCOUNTER — Other Ambulatory Visit: Payer: Self-pay

## 2024-11-23 ENCOUNTER — Encounter: Payer: Self-pay | Admitting: Hematology & Oncology

## 2024-11-23 ENCOUNTER — Emergency Department (HOSPITAL_COMMUNITY)
Admission: EM | Admit: 2024-11-23 | Discharge: 2024-11-23 | Disposition: A | Discharge status: Home / Self Care | Attending: Emergency Medicine | Admitting: Emergency Medicine

## 2024-11-23 ENCOUNTER — Encounter (HOSPITAL_COMMUNITY): Payer: Self-pay | Admitting: Emergency Medicine

## 2024-11-23 DIAGNOSIS — J101 Influenza due to other identified influenza virus with other respiratory manifestations: Secondary | ICD-10-CM | POA: Diagnosis not present

## 2024-11-23 DIAGNOSIS — R0602 Shortness of breath: Secondary | ICD-10-CM | POA: Diagnosis present

## 2024-11-23 DIAGNOSIS — Z85118 Personal history of other malignant neoplasm of bronchus and lung: Secondary | ICD-10-CM | POA: Diagnosis not present

## 2024-11-23 DIAGNOSIS — Z7951 Long term (current) use of inhaled steroids: Secondary | ICD-10-CM | POA: Diagnosis not present

## 2024-11-23 DIAGNOSIS — J441 Chronic obstructive pulmonary disease with (acute) exacerbation: Secondary | ICD-10-CM | POA: Insufficient documentation

## 2024-11-23 LAB — BASIC METABOLIC PANEL WITH GFR
Anion gap: 10 (ref 5–15)
BUN: 10 mg/dL (ref 6–20)
CO2: 25 mmol/L (ref 22–32)
Calcium: 9 mg/dL (ref 8.9–10.3)
Chloride: 102 mmol/L (ref 98–111)
Creatinine, Ser: 0.68 mg/dL (ref 0.44–1.00)
GFR, Estimated: >60 mL/min
Glucose, Bld: 113 mg/dL — ABNORMAL HIGH (ref 70–99)
Potassium: 3.7 mmol/L (ref 3.5–5.1)
Sodium: 137 mmol/L (ref 135–145)

## 2024-11-23 LAB — RESP PANEL BY RT-PCR (RSV, FLU A&B, COVID)  RVPGX2
Influenza A by PCR: POSITIVE — AB
Influenza B by PCR: NEGATIVE
Resp Syncytial Virus by PCR: NEGATIVE
SARS Coronavirus 2 by RT PCR: NEGATIVE

## 2024-11-23 LAB — CBC
HCT: 39.3 % (ref 36.0–46.0)
Hemoglobin: 12.4 g/dL (ref 12.0–15.0)
MCH: 29 pg (ref 26.0–34.0)
MCHC: 31.6 g/dL (ref 30.0–36.0)
MCV: 91.8 fL (ref 80.0–100.0)
Platelets: 254 K/uL (ref 150–400)
RBC: 4.28 MIL/uL (ref 3.87–5.11)
RDW: 15 % (ref 11.5–15.5)
WBC: 7.3 K/uL (ref 4.0–10.5)
nRBC: 0 % (ref 0.0–0.2)

## 2024-11-23 MED ORDER — OSELTAMIVIR PHOSPHATE 75 MG PO CAPS
75.0000 mg | ORAL_CAPSULE | Freq: Once | ORAL | Status: AC
  Administered 2024-11-23: 75 mg via ORAL
  Filled 2024-11-23: qty 1

## 2024-11-23 MED ORDER — OSELTAMIVIR PHOSPHATE 75 MG PO CAPS: 75.0000 mg | ORAL_CAPSULE | Freq: Two times a day (BID) | ORAL | 0 refills | Status: DC

## 2024-11-23 MED ORDER — ALBUTEROL SULFATE HFA 108 (90 BASE) MCG/ACT IN AERS: 1.0000 | INHALATION_SPRAY | Freq: Four times a day (QID) | RESPIRATORY_TRACT | 0 refills | Status: AC | PRN

## 2024-11-23 MED ORDER — METHYLPREDNISOLONE SODIUM SUCC 125 MG IJ SOLR
125.0000 mg | Freq: Once | INTRAMUSCULAR | Status: AC
  Administered 2024-11-23: 125 mg via INTRAVENOUS
  Filled 2024-11-23: qty 2

## 2024-11-23 MED ORDER — IPRATROPIUM-ALBUTEROL 0.5-2.5 (3) MG/3ML IN SOLN
3.0000 mL | Freq: Once | RESPIRATORY_TRACT | Status: AC
  Administered 2024-11-23: 3 mL via RESPIRATORY_TRACT
  Filled 2024-11-23: qty 3

## 2024-11-23 MED ORDER — IOHEXOL 350 MG/ML SOLN
75.0000 mL | Freq: Once | INTRAVENOUS | Status: AC | PRN
  Administered 2024-11-23: 75 mL via INTRAVENOUS

## 2024-11-23 NOTE — ED Notes (Signed)
 Pt request labs come from [facility]

## 2024-11-23 NOTE — ED Provider Notes (Signed)
 " [facility] Provider Note   CSN: [id number] Arrival date & time: 11/23/24  1918     Patient presents with: Shortness of Breath   Tammy Cordova is a 58 y.o. female.   This is a 58 year old female with a history of lung cancer, currently receiving treatment, COPD, asthma who is here today for shortness of breath.  She reports that she was at a Christmas party where there is a lot of secondhand smoke.  While there she began to feel quite short of breath.  She denies fever or chills.   Shortness of Breath      Prior to Admission medications  Medication Sig Start Date End Date Taking? Authorizing Provider  albuterol  (VENTOLIN  HFA) 108 (90 Base) MCG/ACT inhaler Inhale 2 puffs into the lungs every 6 (six) hours as needed for wheezing or shortness of breath.    [provider]  ALPRAZolam  (XANAX ) 0.5 MG tablet Take 1 tablet (0.5 mg total) by mouth daily. Please take half hour before radiation therapy. 04/05/24   Timmy Maude SAUNDERS, MD  cyclobenzaprine  (FLEXERIL ) 10 MG tablet Take 1 tablet (10 mg total) by mouth 2 (two) times daily as needed for muscle spasms. 11/17/24   Nivia Colon, PA-C  dronabinol  ([name] ) 2.5 MG capsule TAKE 1 CAPSULE BY MOUTH TWICE DAILY BEFORE  LUNCH  AND  SUPPER 10/16/24   Timmy Maude SAUNDERS, MD  famotidine  (PEPCID ) 40 MG tablet Take 1 tablet (40 mg total) by mouth 2 (two) times daily. 10/12/24   Timmy Maude SAUNDERS, MD  folic acid  (FOLVITE ) 1 MG tablet Take 1 tablet by mouth once daily 11/01/24   Ennever, Peter R, MD  ibuprofen  (ADVIL ) 600 MG tablet Take 1 tablet (600 mg total) by mouth every 6 (six) hours as needed. 11/17/24   Nivia Colon, PA-C  levofloxacin  (LEVAQUIN ) 500 MG tablet TAKE 1 TABLET BY MOUTH ONCE DAILY *  START  TAKING  LEVAQUIN   DAILY  STARTING  ON  01/01/2024  * 10/16/24   Timmy Maude SAUNDERS, MD  ondansetron  (ZOFRAN ) 8 MG tablet Take 1 tablet (8 mg total) by mouth every 8 (eight) hours as needed for nausea  or vomiting. Start on third day after Carboplatin  12/29/23   Timmy Maude SAUNDERS, MD  pantoprazole  (PROTONIX ) 40 MG tablet Take 40 mg by mouth 2 (two) times daily. 10/15/24   [provider]  prochlorperazine  (COMPAZINE ) 10 MG tablet TAKE 1 TABLET BY MOUTH EVERY 6 HOURS AS NEEDED FOR NAUSEA FOR VOMITING 11/13/24   Timmy Maude SAUNDERS, MD  SYMBICORT  160-4.5 MCG/ACT inhaler Inhale 2 puffs into the lungs 4 (four) times daily as needed. 11/08/24   Timmy Maude SAUNDERS, MD  valACYclovir (VALTREX) 500 MG tablet Take 500 mg by mouth 2 (two) times daily. Patient taking differently: Take 500 mg by mouth 2 (two) times daily as needed.    [provider]  Vitamin D , Ergocalciferol , (DRISDOL ) 1.25 MG (50000 UNIT) CAPS capsule Take 1 capsule by mouth once a week 10/03/24   Ennever, Peter R, MD    Allergies: Patient has no known allergies.    Review of Systems  Respiratory:  Positive for shortness of breath.     Updated Vital Signs BP 132/85 (BP Location: Right Arm)   Pulse (!) 120   Temp 98.9 F (37.2 C) (Oral)   Resp 20   Ht 5' 5 (1.651 m)   Wt 73.5 kg   LMP  (LMP Unknown)   SpO2 95%  BMI 26.96 kg/m   Physical Exam Vitals and nursing note reviewed.  Eyes:     Extraocular Movements: Extraocular movements intact.     Pupils: Pupils are equal, round, and reactive to light.  Pulmonary:     Effort: Pulmonary effort is normal.     Breath sounds: Decreased breath sounds and wheezing present. No rhonchi or rales.  Chest:     Chest wall: No mass.  Abdominal:     Palpations: Abdomen is soft.  Musculoskeletal:     Cervical back: Normal range of motion.  Neurological:     Mental Status: She is alert.     (all labs ordered are listed, but only abnormal results are displayed) Labs Reviewed  RESP PANEL BY RT-PCR (RSV, FLU A&B, COVID)  RVPGX2 - Abnormal; Notable for the following components:      Result Value   Influenza A by PCR POSITIVE (*)    All other components within normal  limits  BASIC METABOLIC PANEL WITH GFR - Abnormal; Notable for the following components:   Glucose, Bld 113 (*)    All other components within normal limits  CBC    EKG: None  Radiology: CT Angio Chest PE W and/or Wo Contrast Result Date: 11/23/2024 EXAM: CTA CHEST 11/23/2024 10:19:17 PM TECHNIQUE: CTA of the chest was performed without and with the administration of 75 mL of intravenous iohexol  (OMNIPAQUE ) 350 MG/ML injection. Multiplanar reformatted images are provided for review. MIP images are provided for review. Automated exposure control, iterative reconstruction, and/or weight based adjustment of the mA/kV was utilized to reduce the radiation dose to as low as reasonably achievable. COMPARISON: Same day x-ray and CT 10/31/2024. CLINICAL HISTORY: Pulmonary embolism (PE) suspected, high prob. FINDINGS: PULMONARY ARTERIES: Pulmonary arteries are adequately opacified for evaluation. Negative for pulmonary embolism. The main pulmonary artery is dilated measuring 37 mm in diameter which can be seen with pulmonary arterial hypertension. MEDIASTINUM: The heart and pericardium demonstrate no acute abnormality. Coronary artery and aortic atherosclerotic calcification. There is no acute abnormality of the thoracic aorta. LYMPH NODES: Unchanged 1.7 cm right paratracheal node on series 12 image 21. No hilar or axillary lymphadenopathy. LUNGS AND PLEURA: Emphysema. Scarring in the medial right upper lobe presumed due to post radiation change. No focal consolidation or pulmonary edema. No evidence of pleural effusion or pneumothorax. UPPER ABDOMEN: Limited images of the upper abdomen are unremarkable. SOFT TISSUES AND BONES: Of chest wall port-a-cath. No acute bone or soft tissue abnormality. IMPRESSION: 1. No pulmonary embolism. 2. Dilated main pulmonary artery measuring 37 mm in diameter, consistent with pulmonary arterial hypertension. 3. Emphysema. 4. Unchanged medial right upper lobe post radiation  scarring and 1.7 cm right paratracheal node. Electronically signed by: Norman Gatlin MD 11/23/2024 10:33 PM EST RP Workstation: [id number]   DG Chest 2 View Result Date: 11/23/2024 CLINICAL DATA:  Shortness of breath. EXAM: CHEST - 2 VIEW COMPARISON:  CT 10/31/2024 FINDINGS: Left chest port with tip in the SVC. Emphysematous changes with bronchial thickening and right suprahilar scarring. No evidence of acute airspace disease. The heart is normal in size with stable mediastinal contours. No pleural effusion or pneumothorax. No pulmonary edema. No acute osseous findings. IMPRESSION: Emphysema with bronchial thickening and right suprahilar scarring. No acute findings. Electronically Signed   By: Andrea Gasman M.D.   On: 11/23/2024 20:11     Procedures   Medications Ordered in the ED  ipratropium-albuterol  (DUONEB) 0.5-2.5 (3) MG/3ML nebulizer solution 3 mL (3 mLs Nebulization Given 11/23/24 2031)  methylPREDNISolone  sodium succinate (SOLU-MEDROL ) 125 mg/2 mL injection 125 mg (125 mg Intravenous Given 11/23/24 2031)  iohexol  (OMNIPAQUE ) 350 MG/ML injection 75 mL (75 mLs Intravenous Contrast Given 11/23/24 2207)  oseltamivir  (TAMIFLU ) capsule 75 mg (75 mg Oral Given 11/23/24 2244)                                    Medical Decision Making 58 year old female here today for shortness of breath.  Differential diagnoses include COPD exacerbation, pleural effusion, PE, pneumonia, influenza.  Plan -patient tachypneic, tachycardic, hypoxic.  Placed on 3 L nasal cannula with significant improvement.  She was 87% on arrival, does not wear any home O2 at baseline.  I suspect this is likely a COPD exacerbation secondary to secondhand smoke exposure.  With her risk factors, will also obtain imaging of the chest.  Will provide her with DuoNeb, steroids.  Viral swabs ordered.  Reassessment 10:45 PM-patient is flu positive.  She responded very well to bronchodilator.  Her CTA was negative.  Ambulated the  patient in the ED and personally observed her ambulation.  She is doing well, speaking in complete sentences, O2 sats did not drop below 95%.  Will send her a prescription for Tamiflu  and another albuterol  inhaler.  I did consider admission, however with her performance during ambulation, she is appropriate for discharge.  Amount and/or Complexity of Data Reviewed Labs: ordered. Radiology: ordered.  Risk Prescription drug management.       Final diagnoses:  COPD exacerbation [facility])  Influenza A    ED Discharge Orders     None          Mannie Fairy DASEN, DO 11/23/24 2251  "

## 2024-11-23 NOTE — ED Notes (Signed)
 Pt was able to ambulate without assistance. Pt's SPO2 ranged from 100-95% on room air. EDP notified

## 2024-11-23 NOTE — Discharge Instructions (Addendum)
 You tested positive for influenza.  I have sent you medicine called Tamiflu , this can help with the duration of influenza.  I have also sent your albuterol  inhaler.  Use it as needed.  Return to the emergency room if you develop fever, worsening shortness of breath, or confusion.  Follow-up with your primary care doctor.

## 2024-11-23 NOTE — ED Triage Notes (Signed)
 Pt states she went to a Christmas party and was exposed to a lot of secondhand smoke, and now she feels SOB and has a cough. O2 saturation 87% on room air, placed on 2L supplemental oxygen with improvement to low 90s. Pt denies CP. Pt is a lung cancer pt.

## 2024-11-23 NOTE — ED Notes (Signed)
 EDP Dr. Mannie at bedside to assess pt.

## 2024-11-27 ENCOUNTER — Other Ambulatory Visit: Payer: Self-pay

## 2024-11-27 DIAGNOSIS — C3411 Malignant neoplasm of upper lobe, right bronchus or lung: Secondary | ICD-10-CM

## 2024-11-27 MED ORDER — DRONABINOL 2.5 MG PO CAPS: 2.5000 mg | ORAL_CAPSULE | Freq: Two times a day (BID) | ORAL | 0 refills | Status: AC

## 2024-11-28 ENCOUNTER — Other Ambulatory Visit: Payer: Self-pay | Admitting: Hematology & Oncology

## 2024-11-28 ENCOUNTER — Other Ambulatory Visit: Payer: Self-pay

## 2024-11-30 ENCOUNTER — Encounter: Payer: Self-pay | Admitting: Hematology & Oncology

## 2024-12-05 ENCOUNTER — Ambulatory Visit: Admitting: Gastroenterology

## 2024-12-07 ENCOUNTER — Inpatient Hospital Stay

## 2024-12-07 ENCOUNTER — Inpatient Hospital Stay (HOSPITAL_BASED_OUTPATIENT_CLINIC_OR_DEPARTMENT_OTHER): Admitting: Hematology & Oncology

## 2024-12-07 ENCOUNTER — Other Ambulatory Visit: Payer: Self-pay

## 2024-12-07 ENCOUNTER — Inpatient Hospital Stay: Attending: Hematology & Oncology

## 2024-12-07 ENCOUNTER — Encounter: Payer: Self-pay | Admitting: Hematology & Oncology

## 2024-12-07 VITALS — BP 125/76 | HR 73 | Resp 18

## 2024-12-07 VITALS — BP 143/83 | HR 81 | Temp 98.1°F | Resp 18 | Ht 65.0 in | Wt 164.0 lb

## 2024-12-07 DIAGNOSIS — B009 Herpesviral infection, unspecified: Secondary | ICD-10-CM | POA: Insufficient documentation

## 2024-12-07 DIAGNOSIS — R7303 Prediabetes: Secondary | ICD-10-CM | POA: Insufficient documentation

## 2024-12-07 DIAGNOSIS — C3411 Malignant neoplasm of upper lobe, right bronchus or lung: Secondary | ICD-10-CM

## 2024-12-07 DIAGNOSIS — Z9221 Personal history of antineoplastic chemotherapy: Secondary | ICD-10-CM | POA: Diagnosis not present

## 2024-12-07 DIAGNOSIS — C3490 Malignant neoplasm of unspecified part of unspecified bronchus or lung: Secondary | ICD-10-CM | POA: Insufficient documentation

## 2024-12-07 DIAGNOSIS — M51369 Other intervertebral disc degeneration, lumbar region without mention of lumbar back pain or lower extremity pain: Secondary | ICD-10-CM | POA: Insufficient documentation

## 2024-12-07 DIAGNOSIS — Z79899 Other long term (current) drug therapy: Secondary | ICD-10-CM | POA: Diagnosis not present

## 2024-12-07 DIAGNOSIS — Z780 Asymptomatic menopausal state: Secondary | ICD-10-CM | POA: Insufficient documentation

## 2024-12-07 DIAGNOSIS — I10 Essential (primary) hypertension: Secondary | ICD-10-CM | POA: Insufficient documentation

## 2024-12-07 DIAGNOSIS — M069 Rheumatoid arthritis, unspecified: Secondary | ICD-10-CM | POA: Insufficient documentation

## 2024-12-07 DIAGNOSIS — F17200 Nicotine dependence, unspecified, uncomplicated: Secondary | ICD-10-CM | POA: Insufficient documentation

## 2024-12-07 DIAGNOSIS — Z5112 Encounter for antineoplastic immunotherapy: Secondary | ICD-10-CM | POA: Diagnosis present

## 2024-12-07 DIAGNOSIS — K219 Gastro-esophageal reflux disease without esophagitis: Secondary | ICD-10-CM | POA: Insufficient documentation

## 2024-12-07 DIAGNOSIS — D500 Iron deficiency anemia secondary to blood loss (chronic): Secondary | ICD-10-CM | POA: Insufficient documentation

## 2024-12-07 DIAGNOSIS — R32 Unspecified urinary incontinence: Secondary | ICD-10-CM | POA: Insufficient documentation

## 2024-12-07 DIAGNOSIS — D638 Anemia in other chronic diseases classified elsewhere: Secondary | ICD-10-CM | POA: Insufficient documentation

## 2024-12-07 DIAGNOSIS — D72819 Decreased white blood cell count, unspecified: Secondary | ICD-10-CM | POA: Insufficient documentation

## 2024-12-07 LAB — CBC WITH DIFFERENTIAL (CANCER CENTER ONLY)
Abs Immature Granulocytes: 0.01 K/uL (ref 0.00–0.07)
Basophils Absolute: 0 K/uL (ref 0.0–0.1)
Basophils Relative: 0 %
Eosinophils Absolute: 0.3 K/uL (ref 0.0–0.5)
Eosinophils Relative: 7 %
HCT: 39.3 % (ref 36.0–46.0)
Hemoglobin: 12.8 g/dL (ref 12.0–15.0)
Immature Granulocytes: 0 %
Lymphocytes Relative: 17 %
Lymphs Abs: 0.7 K/uL (ref 0.7–4.0)
MCH: 29.4 pg (ref 26.0–34.0)
MCHC: 32.6 g/dL (ref 30.0–36.0)
MCV: 90.3 fL (ref 80.0–100.0)
Monocytes Absolute: 0.2 K/uL (ref 0.1–1.0)
Monocytes Relative: 4 %
Neutro Abs: 3 K/uL (ref 1.7–7.7)
Neutrophils Relative %: 72 %
Platelet Count: 236 K/uL (ref 150–400)
RBC: 4.35 MIL/uL (ref 3.87–5.11)
RDW: 14.9 % (ref 11.5–15.5)
WBC Count: 4.1 K/uL (ref 4.0–10.5)
nRBC: 0 % (ref 0.0–0.2)

## 2024-12-07 LAB — CMP (CANCER CENTER ONLY)
ALT: <5 U/L (ref 0–44)
AST: <10 U/L — ABNORMAL LOW (ref 15–41)
Albumin: 3.9 g/dL (ref 3.5–5.0)
Alkaline Phosphatase: 79 U/L (ref 38–126)
Anion gap: 10 (ref 5–15)
BUN: 10 mg/dL (ref 6–20)
CO2: 22 mmol/L (ref 22–32)
Calcium: 9 mg/dL (ref 8.9–10.3)
Chloride: 107 mmol/L (ref 98–111)
Creatinine: 0.76 mg/dL (ref 0.44–1.00)
GFR, Estimated: >60 mL/min
Glucose, Bld: 112 mg/dL — ABNORMAL HIGH (ref 70–99)
Potassium: 4.1 mmol/L (ref 3.5–5.1)
Sodium: 138 mmol/L (ref 135–145)
Total Bilirubin: 0.5 mg/dL (ref 0.0–1.2)
Total Protein: 7.9 g/dL (ref 6.5–8.1)

## 2024-12-07 LAB — LACTATE DEHYDROGENASE: LDH: 181 U/L (ref 105–235)

## 2024-12-07 LAB — TSH: TSH: 1.37 u[IU]/mL (ref 0.350–4.500)

## 2024-12-07 MED ORDER — DEXLANSOPRAZOLE 30 MG PO CPDR: 30.0000 mg | DELAYED_RELEASE_CAPSULE | Freq: Every day | ORAL | 6 refills | Status: AC

## 2024-12-07 MED ORDER — BELSOMRA 15 MG PO TABS: 15.0000 mg | ORAL_TABLET | Freq: Every evening | ORAL | 3 refills | Status: DC | PRN

## 2024-12-07 MED ORDER — SODIUM CHLORIDE 0.9 % IV SOLN: INTRAVENOUS | Status: DC

## 2024-12-07 MED ORDER — SODIUM CHLORIDE 0.9 % IV SOLN
1200.0000 mg | Freq: Once | INTRAVENOUS | Status: AC
  Administered 2024-12-07: 1200 mg via INTRAVENOUS
  Filled 2024-12-07: qty 20

## 2024-12-07 NOTE — Patient Instructions (Signed)
 [facility] - A DEPT OF [facility]  Discharge Instructions: Thank you for choosing [facility] to provide your oncology and hematology care.   If you have a lab appointment with the Cancer Center, please go directly to the Cancer Center and check in at the registration area.  Wear comfortable clothing and clothing appropriate for easy access to any Portacath or PICC line.   We strive to give you quality time with your provider. You may need to reschedule your appointment if you arrive late (15 or more minutes).  Arriving late affects you and other patients whose appointments are after yours.  Also, if you miss three or more appointments without notifying the office, you may be dismissed from the clinic at the provider's discretion.      For prescription refill requests, have your pharmacy contact our office and allow 72 hours for refills to be completed.    Today you received the following chemotherapy and/or immunotherapy agents Tecentriq      To help prevent nausea and vomiting after your treatment, we encourage you to take your nausea medication as directed.  BELOW ARE SYMPTOMS THAT SHOULD BE REPORTED IMMEDIATELY: *FEVER GREATER THAN 100.4 F (38 C) OR HIGHER *CHILLS OR SWEATING *NAUSEA AND VOMITING THAT IS NOT CONTROLLED WITH YOUR NAUSEA MEDICATION *UNUSUAL SHORTNESS OF BREATH *UNUSUAL BRUISING OR BLEEDING *URINARY PROBLEMS (pain or burning when urinating, or frequent urination) *BOWEL PROBLEMS (unusual diarrhea, constipation, pain near the anus) TENDERNESS IN MOUTH AND THROAT WITH OR WITHOUT PRESENCE OF ULCERS (sore throat, sores in mouth, or a toothache) UNUSUAL RASH, SWELLING OR PAIN  UNUSUAL VAGINAL DISCHARGE OR ITCHING   Items with * indicate a potential emergency and should be followed up as soon as possible or go to the Emergency Department if any problems should occur.  Please show the CHEMOTHERAPY ALERT CARD or IMMUNOTHERAPY  ALERT CARD at check-in to the Emergency Department and triage nurse. Should you have questions after your visit or need to cancel or reschedule your appointment, please contact [facility] - A DEPT OF [facility]  [phone] and follow the prompts.  Office hours are 8:00 a.m. to 4:30 p.m. Monday - Friday. Please note that voicemails left after 4:00 p.m. may not be returned until the following business day.  We are closed weekends and major holidays. You have access to a nurse at all times for urgent questions. Please call the main number to the clinic [phone] and follow the prompts.  For any non-urgent questions, you may also contact your provider using MyChart. We now offer e-Visits for anyone 74 and older to request care online for non-urgent symptoms. For details visit [email or web address].   Also download the MyChart app! Go to the app store, search "MyChart", open the app, select [place], and log in with your MyChart username and password.

## 2024-12-07 NOTE — Progress Notes (Signed)
 " Hematology and Oncology Follow Up Visit  Tammy Cordova [id number] February 28, 1966 59 y.o. 12/07/2024   Principle Diagnosis:  Extensive stage small cell lung cancer --TMB=23  Current Therapy:   Status post cycle 1 of carboplatinum/etoposide  + XRT s/p cycle #4 -- start on 12/04/2023 Tecentriq  - maintenance s/p cycle #9 -- start on 03/16/2024 PCI -start on 04/10/2024 -completed on 04/27/2024     Interim History:  Tammy Cordova is back for follow-up.  Unfortunately, she had the flu.  She went to the ER on Christmas Day.  She had a CT angiogram of the chest done.  There is no pulmonary embolism.  She had no evidence of any malignancy with her cancer..  She was placed on Tamiflu ..  She is feeling better.  She has had no problems with pruritus.  She has had no problems with nausea or vomiting.  She has had no leg swelling.  She has had no rashes.  There has been no bleeding.  She has had no headache.  Her TSH level today was 1.4.  She is having quite bit of problems with reflux.  I will try her on some Dexilant  (30 mg p.o. daily) and lets see if this may help.  Overall, I will say that her performance status right now is probably ECOG 1.  .   Wt Readings from Last 3 Encounters:  12/07/24 164 lb (74.4 kg)  11/23/24 162 lb (73.5 kg)  11/17/24 162 lb (73.5 kg)     Medications:  Current Outpatient Medications:    albuterol  (VENTOLIN  HFA) 108 (90 Base) MCG/ACT inhaler, Inhale 2 puffs into the lungs every 6 (six) hours as needed for wheezing or shortness of breath., Disp: , Rfl:    albuterol  (VENTOLIN  HFA) 108 (90 Base) MCG/ACT inhaler, Inhale 1-2 puffs into the lungs every 6 (six) hours as needed for wheezing or shortness of breath., Disp: 8 g, Rfl: 0   ALPRAZolam  (XANAX ) 0.5 MG tablet, Take 1 tablet (0.5 mg total) by mouth daily. Please take half hour before radiation therapy., Disp: 21 tablet, Rfl: 0   cyclobenzaprine  (FLEXERIL ) 10 MG tablet, Take 1 tablet (10 mg total) by mouth 2 (two) times  daily as needed for muscle spasms., Disp: 20 tablet, Rfl: 0   dronabinol  ([name] ) 2.5 MG capsule, Take 1 capsule (2.5 mg total) by mouth 2 (two) times daily before lunch and supper., Disp: 60 capsule, Rfl: 0   famotidine  (PEPCID ) 40 MG tablet, Take 1 tablet (40 mg total) by mouth 2 (two) times daily., Disp: 180 tablet, Rfl: 4   folic acid  (FOLVITE ) 1 MG tablet, Take 1 tablet by mouth once daily, Disp: 30 tablet, Rfl: 0   ibuprofen  (ADVIL ) 600 MG tablet, Take 1 tablet (600 mg total) by mouth every 6 (six) hours as needed., Disp: 30 tablet, Rfl: 0   levofloxacin  (LEVAQUIN ) 500 MG tablet, TAKE 1 TABLET BY MOUTH ONCE DAILY *  START  TAKING  LEVAQUIN   DAILY  STARTING  ON  01/01/2024  *, Disp: 15 tablet, Rfl: 0   ondansetron  (ZOFRAN ) 8 MG tablet, Take 1 tablet (8 mg total) by mouth every 8 (eight) hours as needed for nausea or vomiting. Start on third day after Carboplatin , Disp: 30 tablet, Rfl: 1   oseltamivir  (TAMIFLU ) 75 MG capsule, Take 1 capsule (75 mg total) by mouth every 12 (twelve) hours., Disp: 10 capsule, Rfl: 0   pantoprazole  (PROTONIX ) 40 MG tablet, Take 40 mg by mouth 2 (two) times daily., Disp: , Rfl:  prochlorperazine  (COMPAZINE ) 10 MG tablet, TAKE 1 TABLET BY MOUTH EVERY 6 HOURS AS NEEDED FOR NAUSEA FOR VOMITING, Disp: 30 tablet, Rfl: 0   SYMBICORT  160-4.5 MCG/ACT inhaler, Inhale 2 puffs into the lungs 4 (four) times daily as needed., Disp: 1 each, Rfl: 3   valACYclovir (VALTREX) 500 MG tablet, Take 500 mg by mouth 2 (two) times daily. (Patient taking differently: Take 500 mg by mouth 2 (two) times daily as needed.), Disp: , Rfl:    Vitamin D , Ergocalciferol , (DRISDOL ) 1.25 MG (50000 UNIT) CAPS capsule, Take 1 capsule by mouth once a week, Disp: 4 capsule, Rfl: 0  Allergies: No Known Allergies  Past Medical History, Surgical history, Social history, and Family History were reviewed and updated.  Review of Systems: Review of Systems  Constitutional:  Negative for appetite change and  unexpected weight change.  HENT:   Negative for sore throat.   Eyes: Negative.   Respiratory: Negative.    Cardiovascular: Negative.   Gastrointestinal:  Negative for nausea.  Endocrine: Negative.   Genitourinary: Negative.    Musculoskeletal: Negative.   Skin: Negative.   Neurological:  Negative for dizziness.  Hematological: Negative.   Psychiatric/Behavioral: Negative.      Physical Exam: Vitals:   12/07/24 1000  BP: (!) 143/83  Pulse: 81  Resp: 18  Temp: 98.1 F (36.7 C)  SpO2: 94%     Wt Readings from Last 3 Encounters:  12/07/24 164 lb (74.4 kg)  11/23/24 162 lb (73.5 kg)  11/17/24 162 lb (73.5 kg)    Physical Exam Vitals reviewed.  HENT:     Head: Normocephalic and atraumatic.  Eyes:     Pupils: Pupils are equal, round, and reactive to light.  Cardiovascular:     Rate and Rhythm: Normal rate and regular rhythm.     Heart sounds: Normal heart sounds.  Pulmonary:     Effort: Pulmonary effort is normal.     Breath sounds: Normal breath sounds.  Abdominal:     General: Bowel sounds are normal.     Palpations: Abdomen is soft.  Musculoskeletal:        General: No tenderness or deformity. Normal range of motion.     Cervical back: Normal range of motion.  Lymphadenopathy:     Cervical: No cervical adenopathy.  Skin:    General: Skin is warm and dry.     Findings: No erythema or rash.  Neurological:     Mental Status: She is alert and oriented to person, place, and time.  Psychiatric:        Behavior: Behavior normal.        Thought Content: Thought content normal.        Judgment: Judgment normal.      Lab Results  Component Value Date   WBC 4.1 12/07/2024   HGB 12.8 12/07/2024   HCT 39.3 12/07/2024   MCV 90.3 12/07/2024   PLT 236 12/07/2024     Chemistry      Component Value Date/Time   NA 138 12/07/2024 1002   K 4.1 12/07/2024 1002   CL 107 12/07/2024 1002   CO2 22 12/07/2024 1002   BUN 10 12/07/2024 1002   CREATININE 0.76  12/07/2024 1002      Component Value Date/Time   CALCIUM  9.0 12/07/2024 1002   ALKPHOS 79 12/07/2024 1002   AST <10 (L) 12/07/2024 1002   ALT <5 12/07/2024 1002   BILITOT 0.5 12/07/2024 1002      Impression and Plan: Ms.  Cordova is a very charming 59 year old Afro-American female with extensive stage small cell lung cancer.  Because of the bulk of her mediastinal disease, we went ahead and gave radiation with chemotherapy.  Again, she had a very nice response by the last PET scan.  She had PCI.  I think this has certainly helped her out.  Her last PET scan was done back in September.  We probably need to get her set up with another one.  I will see about getting 1 set up for her in about 2 or 3 weeks.  I think she is doing well with the maintenance therapy.  She is on maintenance immunotherapy.  I will plan to have her come back in 4 weeks.    Maude JONELLE Crease, MD 1/8/202610:58 AM "

## 2024-12-07 NOTE — Patient Instructions (Signed)

## 2024-12-08 ENCOUNTER — Encounter: Payer: Self-pay | Admitting: Gastroenterology

## 2024-12-08 ENCOUNTER — Ambulatory Visit: Admitting: Gastroenterology

## 2024-12-08 VITALS — BP 160/96 | HR 83 | Temp 97.3°F | Resp 17 | Ht 64.0 in | Wt 158.1 lb

## 2024-12-08 DIAGNOSIS — K222 Esophageal obstruction: Secondary | ICD-10-CM | POA: Diagnosis present

## 2024-12-08 DIAGNOSIS — K3189 Other diseases of stomach and duodenum: Secondary | ICD-10-CM | POA: Diagnosis not present

## 2024-12-08 DIAGNOSIS — K2970 Gastritis, unspecified, without bleeding: Secondary | ICD-10-CM

## 2024-12-08 DIAGNOSIS — R1319 Other dysphagia: Secondary | ICD-10-CM

## 2024-12-08 DIAGNOSIS — K219 Gastro-esophageal reflux disease without esophagitis: Secondary | ICD-10-CM

## 2024-12-08 DIAGNOSIS — K2950 Unspecified chronic gastritis without bleeding: Secondary | ICD-10-CM | POA: Diagnosis not present

## 2024-12-08 DIAGNOSIS — R1310 Dysphagia, unspecified: Secondary | ICD-10-CM | POA: Diagnosis not present

## 2024-12-08 MED ORDER — FAMOTIDINE 40 MG PO TABS: 40.0000 mg | ORAL_TABLET | Freq: Two times a day (BID) | ORAL | 3 refills | Status: AC

## 2024-12-08 MED ORDER — SODIUM CHLORIDE 0.9 % IV SOLN: 500.0000 mL | Freq: Once | INTRAVENOUS | Status: DC

## 2024-12-08 NOTE — Patient Instructions (Signed)
 Handout provided about esophageal stricture, gastritis and dilation diet.  Soft diet today then advance diet as tolerated tomorrow.  Continue present medications.  Resume famotidine  40 mg twice a day for time being, even if starting Dexilant .  Await pathology results.  Repeat upper endoscopy PRN for retreatment.  Return to GI office in 3 months, or sooner as needed.  YOU HAD AN ENDOSCOPIC PROCEDURE TODAY AT THE [facility]:   Refer to the procedure report that was given to you for any specific questions about what was found during the examination.  If the procedure report does not answer your questions, please call your gastroenterologist to clarify.  If you requested that your care partner not be given the details of your procedure findings, then the procedure report has been included in a sealed envelope for you to review at your convenience later.  YOU SHOULD EXPECT: Some feelings of bloating in the abdomen. Passage of more gas than usual.  Walking can help get rid of the air that was put into your GI tract during the procedure and reduce the bloating. If you had a lower endoscopy (such as a colonoscopy or flexible sigmoidoscopy) you may notice spotting of blood in your stool or on the toilet paper. If you underwent a bowel prep for your procedure, you may not have a normal bowel movement for a few days.  Please Note:  You might notice some irritation and congestion in your nose or some drainage.  This is from the oxygen used during your procedure.  There is no need for concern and it should clear up in a day or so.  SYMPTOMS TO REPORT IMMEDIATELY:  Following upper endoscopy (EGD)  Vomiting of blood or coffee ground material  New chest pain or pain under the shoulder blades  Painful or persistently difficult swallowing  New shortness of breath  Fever of 100F or higher  Black, tarry-looking stools  For urgent or emergent issues, a gastroenterologist can be reached at any  hour by calling [phone]. Do not use MyChart messaging for urgent concerns.    DIET:  We do recommend a small meal at first, but then you may proceed to your regular diet.  Drink plenty of fluids but you should avoid alcoholic beverages for 24 hours.  ACTIVITY:  You should plan to take it easy for the rest of today and you should NOT DRIVE or use heavy machinery until tomorrow (because of the sedation medicines used during the test).    FOLLOW UP: Our staff will call the number listed on your records the next business day following your procedure.  We will call around 7:15- 8:00 am to check on you and address any questions or concerns that you may have regarding the information given to you following your procedure. If we do not reach you, we will leave a message.     If any biopsies were taken you will be contacted by phone or by letter within the next 1-3 weeks.  Please call us  at [phone] if you have not heard about the biopsies in 3 weeks.    SIGNATURES/CONFIDENTIALITY: You and/or your care partner have signed paperwork which will be entered into your electronic medical record.  These signatures attest to the fact that that the information above on your After Visit Summary has been reviewed and is understood.  Full responsibility of the confidentiality of this discharge information lies with you and/or your care-partner.

## 2024-12-08 NOTE — Progress Notes (Signed)
 Called to room to assist during endoscopic procedure.  Patient ID and intended procedure confirmed with present staff. Received instructions for my participation in the procedure from the performing physician.

## 2024-12-08 NOTE — Progress Notes (Signed)
 "   GASTROENTEROLOGY PROCEDURE H&P NOTE   Primary Care Physician: Wendolyn Jenkins Jansky, MD    Reason for Procedure:   GERD, dysphagia, esophageal stricture, gastric nodule  Plan:    EGD with dilation and/or biopsy  Patient is appropriate for endoscopic procedure(s) in the ambulatory ([facility]) setting.  The nature of the procedure, as well as the risks, benefits, and alternatives were carefully and thoroughly reviewed with the patient. Ample time for discussion and questions allowed. The patient understood, was satisfied, and agreed to proceed. I personally addressed all patient questions and concerns.     HPI: Tammy Cordova is a 59 y.o. female who presents for EGD for evaluation and treatment of dysphagia with known history of esophageal stricture as below, along with history of GERD.  Last EGD was 05/2024 with a proximal radiation stricture dilated with 15 mm TTS balloon and a distal stricture dilated with 18 mm TTS balloon as below.  Initially had resolution of dysphagia and was symptom-free at follow-up on 09/13/2024, but slowly had return of symptoms over the last 1-2 months.  Also with breakthrough reflux symptoms over the last 1-2 months despite pantoprazole  40 mg twice daily.  Has since added Pepcid  40 mg twice daily and presents today for reevaluation. Dexilant  30 mg was prescribed earlier this week by her Oncologist, but she has not yet started.   Additionally, last EGD with a 5 mm nodule in the gastric fundus with benign biopsies, and had recommended repeat endoscopy in 1 year for reevaluation.  Prior GI evaluation: --Barium esophagram (05/15/2024): normal motility, persistent coating of barium in the cervical esophagus with questionable diverticulum, tiny hiatal hernia, 13 mm barium tablet would not pass beyond distal esophagus at the GE junction with slight narrowing. --PET/CT on 6/5 without abnormal uptake in the GI tract.  Further reduction in size and uptake of right apical mass  lesion, prominent iliac lymph nodes similar to prior.   --MRI brain on 6/23 without intracranial metastatic lesions.  --labs from 05/24/2024 with essentially stable CBC and normal CMP as below. --EGD (06/13/24) :Benign- appearing esophageal stenoses. Dilated with 18 mm TTS balloon with appropriate mucosal disruption. This was then biopsied for histology and for further fracturing of the rings.Radiation stricture in the proximal esophagus. Dilated with 15 mm TTS balloon with several appropriate mucosal disruptions. Biopsied. Z- line regular, 40 cm from the incisors. A single submucosal papule ( nodule) found in the stomach. Biopsied. Normal mucosa was found in the entire stomach. Normal examined duodenum. BX:The gastric nodule that was biopsied demonstrated benign, chronic, inactive gastritis (inflammation). No intestinal metaplasia, dysplasia, Helicobacter pylori infection, or other concerning findings. The stricture in the lower esophagus that was biopsied was benign. The stricture in the upper esophagus demonstrates reactive epithelial changes which can be seen with radiation injury. No evidence of infection or other concerning features.  Past Medical History:  Diagnosis Date   Asthma    COPD (chronic obstructive pulmonary disease) (HCC)    GERD (gastroesophageal reflux disease)    Lung cancer (HCC)    Pneumonia    RA (rheumatoid arthritis) (HCC)    Vitamin D  deficiency     Past Surgical History:  Procedure Laterality Date   BREAST BIOPSY Left 04/2016   REACTIVE LYMPHOID HYPERPLASIA    BUNIONECTOMY Bilateral    bilateral   IR IMAGING GUIDED PORT INSERTION  12/03/2023    Prior to Admission medications  Medication Sig Start Date End Date Taking? Authorizing Provider  albuterol  (VENTOLIN  HFA) 108 (  90 Base) MCG/ACT inhaler Inhale 2 puffs into the lungs every 6 (six) hours as needed for wheezing or shortness of breath.    [provider]  albuterol  (VENTOLIN  HFA) 108 (90 Base)  MCG/ACT inhaler Inhale 1-2 puffs into the lungs every 6 (six) hours as needed for wheezing or shortness of breath. 11/23/24   Mannie Fairy DASEN, DO  ALPRAZolam  (XANAX ) 0.5 MG tablet Take 1 tablet (0.5 mg total) by mouth daily. Please take half hour before radiation therapy. 04/05/24   Timmy Maude SAUNDERS, MD  cyclobenzaprine  (FLEXERIL ) 10 MG tablet Take 1 tablet (10 mg total) by mouth 2 (two) times daily as needed for muscle spasms. 11/17/24   Nivia Colon, PA-C  Dexlansoprazole  30 MG capsule DR Take 1 capsule (30 mg total) by mouth daily. 12/07/24   Timmy Maude SAUNDERS, MD  dronabinol  ([name] ) 2.5 MG capsule Take 1 capsule (2.5 mg total) by mouth 2 (two) times daily before lunch and supper. 11/27/24   Timmy Maude SAUNDERS, MD  folic acid  (FOLVITE ) 1 MG tablet Take 1 tablet by mouth once daily 11/28/24   Ennever, Peter R, MD  ibuprofen  (ADVIL ) 600 MG tablet Take 1 tablet (600 mg total) by mouth every 6 (six) hours as needed. 11/17/24   Nivia Colon, PA-C  ondansetron  (ZOFRAN ) 8 MG tablet Take 1 tablet (8 mg total) by mouth every 8 (eight) hours as needed for nausea or vomiting. Start on third day after Carboplatin  12/29/23   Ennever, Maude SAUNDERS, MD  prochlorperazine  (COMPAZINE ) 10 MG tablet TAKE 1 TABLET BY MOUTH EVERY 6 HOURS AS NEEDED FOR NAUSEA FOR VOMITING 11/13/24   Timmy Maude SAUNDERS, MD  Suvorexant  (BELSOMRA ) 15 MG TABS Take 1 tablet (15 mg total) by mouth at bedtime as needed. 12/07/24   Timmy Maude SAUNDERS, MD  SYMBICORT  160-4.5 MCG/ACT inhaler Inhale 2 puffs into the lungs 4 (four) times daily as needed. 11/08/24   Timmy Maude SAUNDERS, MD  valACYclovir (VALTREX) 500 MG tablet Take 500 mg by mouth 2 (two) times daily. Patient taking differently: Take 500 mg by mouth 2 (two) times daily as needed.    [provider]  Vitamin D , Ergocalciferol , (DRISDOL ) 1.25 MG (50000 UNIT) CAPS capsule Take 1 capsule by mouth once a week 10/03/24   Timmy Maude SAUNDERS, MD    Current Outpatient Medications  Medication Sig Dispense  Refill   albuterol  (VENTOLIN  HFA) 108 (90 Base) MCG/ACT inhaler Inhale 2 puffs into the lungs every 6 (six) hours as needed for wheezing or shortness of breath.     albuterol  (VENTOLIN  HFA) 108 (90 Base) MCG/ACT inhaler Inhale 1-2 puffs into the lungs every 6 (six) hours as needed for wheezing or shortness of breath. 8 g 0   ALPRAZolam  (XANAX ) 0.5 MG tablet Take 1 tablet (0.5 mg total) by mouth daily. Please take half hour before radiation therapy. 21 tablet 0   cyclobenzaprine  (FLEXERIL ) 10 MG tablet Take 1 tablet (10 mg total) by mouth 2 (two) times daily as needed for muscle spasms. 20 tablet 0   Dexlansoprazole  30 MG capsule DR Take 1 capsule (30 mg total) by mouth daily. 30 capsule 6   dronabinol  ([name] ) 2.5 MG capsule Take 1 capsule (2.5 mg total) by mouth 2 (two) times daily before lunch and supper. 60 capsule 0   folic acid  (FOLVITE ) 1 MG tablet Take 1 tablet by mouth once daily 30 tablet 0   ibuprofen  (ADVIL ) 600 MG tablet Take 1 tablet (600 mg total) by mouth every 6 (six)  hours as needed. 30 tablet 0   ondansetron  (ZOFRAN ) 8 MG tablet Take 1 tablet (8 mg total) by mouth every 8 (eight) hours as needed for nausea or vomiting. Start on third day after Carboplatin  30 tablet 1   prochlorperazine  (COMPAZINE ) 10 MG tablet TAKE 1 TABLET BY MOUTH EVERY 6 HOURS AS NEEDED FOR NAUSEA FOR VOMITING 30 tablet 0   Suvorexant  (BELSOMRA ) 15 MG TABS Take 1 tablet (15 mg total) by mouth at bedtime as needed. 30 tablet 3   SYMBICORT  160-4.5 MCG/ACT inhaler Inhale 2 puffs into the lungs 4 (four) times daily as needed. 1 each 3   valACYclovir (VALTREX) 500 MG tablet Take 500 mg by mouth 2 (two) times daily. (Patient taking differently: Take 500 mg by mouth 2 (two) times daily as needed.)     Vitamin D , Ergocalciferol , (DRISDOL ) 1.25 MG (50000 UNIT) CAPS capsule Take 1 capsule by mouth once a week 4 capsule 0   No current facility-administered medications for this visit.    Allergies as of 12/08/2024   (No  Known Allergies)    Family History  Problem Relation Age of Onset   Diabetes Mother    Hypertension Mother    Diabetes Father    Hypertension Father    Prostate cancer Father    Hypertension Brother    Diabetes Brother    Stroke Brother    Breast cancer Paternal Grandmother 45   Cancer Paternal Grandfather        type unknown    Social History   Socioeconomic History   Marital status: Married    Spouse name: Not on file   Number of children: 0   Years of education: Not on file   Highest education level: Not on file  Occupational History   Not on file  Tobacco Use   Smoking status: Former    Current packs/day: 0.00    Average packs/day: 0.3 packs/day for 36.0 years (10.8 ttl pk-yrs)    Types: Cigarettes    Quit date: 11/23/2023    Years since quitting: 1.0   Smokeless tobacco: Never   Tobacco comments:    Quit smoking the Saturday before Christmas 2024  Vaping Use   Vaping status: Never Used  Substance and Sexual Activity   Alcohol use: No   Drug use: Yes    Types: Marijuana   Sexual activity: Not Currently  Other Topics Concern   Not on file  Social History Narrative   Not on file   Social Drivers of Health   Tobacco Use: Medium Risk (12/08/2024)   Patient History    Smoking Tobacco Use: Former    Smokeless Tobacco Use: Never    Passive Exposure: Not on file  Financial Resource Strain: Medium Risk (02/04/2024)   Overall Financial Resource Strain (CARDIA)    Difficulty of Paying Living Expenses: Somewhat hard  Food Insecurity: Patient Declined (12/17/2023)   Hunger Vital Sign    Worried About Running Out of Food in the Last Year: Patient declined    Ran Out of Food in the Last Year: Patient declined  Recent Concern: Food Insecurity - Food Insecurity Present (12/03/2023)   Hunger Vital Sign    Worried About Running Out of Food in the Last Year: Sometimes true    Ran Out of Food in the Last Year: Sometimes true  Transportation Needs: No Transportation Needs  (12/17/2023)   PRAPARE - Administrator, Civil Service (Medical): No    Lack of Transportation (Non-Medical): No  Physical Activity: Not on file  Stress: Not on file  Social Connections: Socially Integrated (12/03/2023)   Social Connection and Isolation Panel    Frequency of Communication with Friends and Family: Once a week    Frequency of Social Gatherings with Friends and Family: Twice a week    Attends Religious Services: 1 to 4 times per year    Active Member of [employer] or Organizations: Yes    Attends Banker Meetings: 1 to 4 times per year    Marital Status: Married  Catering Manager Violence: Not At Risk (12/17/2023)   Humiliation, Afraid, Rape, and Kick questionnaire    Fear of Current or Ex-Partner: No    Emotionally Abused: No    Physically Abused: No    Sexually Abused: No  Depression (PHQ2-9): Low Risk (12/07/2024)   Depression (PHQ2-9)    PHQ-2 Score: 0  Alcohol Screen: Not on file  Housing: Low Risk (12/17/2023)   Housing Stability Vital Sign    Unable to Pay for Housing in the Last Year: No    Number of Times Moved in the Last Year: 0    Homeless in the Last Year: No  Utilities: Not At Risk (12/17/2023)   [facility] Utilities    Threatened with loss of utilities: No  Recent Concern: Utilities - At Risk (11/27/2023)   [employer] Utilities    Threatened with loss of utilities: Yes  Health Literacy: Adequate Health Literacy (12/29/2023)   B1300 Health Literacy    Frequency of need for help with medical instructions: Never    Physical Exam: Vital signs in last 24 hours: @LMP   (LMP Unknown)  GEN: NAD EYE: Sclerae anicteric ENT: MMM CV: Non-tachycardic Pulm: CTA b/l GI: Soft, NT/ND NEURO:  Alert & Oriented x 3   Sandor Flatter, DO  Gastroenterology   12/08/2024 9:11 AM  "

## 2024-12-08 NOTE — Progress Notes (Signed)
0924 Robinul 0.1 mg IV given due large amount of secretions upon assessment.  MD made aware, vss 

## 2024-12-08 NOTE — Op Note (Signed)
 [facility] Patient Name: Tammy Cordova Procedure Date: 12/08/2024 9:09 AM MRN: [id number] Endoscopist: Sandor Flatter , MD, [id number] Age: 59 Referring MD:  Date of Birth: Feb 02, 1966 Gender: Female Account #: [id number] Procedure:                Upper GI endoscopy Indications:              Dysphagia, Esophageal reflux                           Last EGD was 05/2024 with a proximal radiation                            stricture dilated with 15 mm TTS balloon and a                            distal stricture dilated with 18 mm TTS balloon as                            below. Initially had resolution of dysphagia and                            was symptom-free at follow-up on 09/13/2024, but                            slowly had return of symptoms over the last 1-2                            months. Also with breakthrough reflux symptoms over                            the last 1-2 months despite pantoprazole  40 mg                            twice daily. Has since added Pepcid  40 mg twice                            daily and presents today for reevaluation. Dexilant                             30 mg was prescribed earlier this week by her                            Oncologist, but she has not yet started.                           Additionally, last EGD with a 5 mm nodule in the                            gastric fundus with benign biopsies. Medicines:                Monitored Anesthesia Care Procedure:                Pre-Anesthesia Assessment:                           -  Prior to the procedure, a History and Physical                            was performed, and patient medications and                            allergies were reviewed. The patient's tolerance of                            previous anesthesia was also reviewed. The risks                            and benefits of the procedure and the sedation                            options and risks were discussed with the  patient.                            All questions were answered, and informed consent                            was obtained. Prior Anticoagulants: The patient has                            taken no anticoagulant or antiplatelet agents. ASA                            Grade Assessment: III - A patient with severe                            systemic disease. After reviewing the risks and                            benefits, the patient was deemed in satisfactory                            condition to undergo the procedure.                           After obtaining informed consent, the endoscope was                            passed under direct vision. Throughout the                            procedure, the patient's blood pressure, pulse, and                            oxygen saturations were monitored continuously. The                            Olympus Scope [id number] was introduced through the  mouth, and advanced to the second part of duodenum.                            The upper GI endoscopy was accomplished without                            difficulty. The patient tolerated the procedure                            well. Scope In: Scope Out: Findings:                 One moderate stenosis was found 18 to 23 cm from                            the incisors. This stenosis measured 5 cm (in                            length). Endoscopic appearance consistent with                            known history of radiation stricture. The stenosis                            was traversed. A TTS dilator was passed through the                            scope. Dilation with a 15-16.5-18 mm balloon                            dilator was performed to 16.5 mm. The dilation site                            was examined and showed mild mucosal disruption in                            a few locations and moderate improvement in luminal                             narrowing, all consistent with successful dilation.                            Estimated blood loss was minimal.                           One benign-appearing, intrinsic mild stenosis was                            found 35 cm from the incisors. This stenosis                            measured 1 cm (in length). The stenosis was  traversed. A TTS dilator was passed through the                            scope. Dilation with a 15-16.5-18 mm balloon                            dilator was performed to 18 mm. The dilation site                            was examined and showed mild mucosal disruption and                            moderate improvement in luminal narrowing                            consistent with successful dilation. Estimated                            blood loss was minimal.                           The Z-line was regular and was found 37 cm from the                            incisors.                           The gastroesophageal flap valve was visualized                            endoscopically and classified as Hill Grade III                            (minimal fold, loose to endoscope, hiatal hernia                            likely).                           Diffuse mild inflammation characterized by                            congestion (edema) and erythema was found in the                            gastric fundus and in the gastric body. Biopsies                            were taken with a cold forceps for histology.                            Estimated blood loss was minimal.                           A single 4 mm mucosal  mucosal nodule with no                            bleeding was found in the gastric body. Biopsies                            were taken with a cold forceps for histology.                            Estimated blood loss was minimal.                           A single 5 mm submucosal nodule with no bleeding                             was found in the gastric fundus. This was firm and                            semi-mobile. Bite-on-bite biopsies were taken with                            a cold forceps for histology. Estimated blood loss                            was minimal.                           The incisura, gastric antrum and pylorus were                            normal.                           The examined duodenum was normal. Complications:            No immediate complications. Estimated Blood Loss:     Estimated blood loss was minimal. Impression:               - Esophageal stenosis. Dilated with 16.5 mm TTS                            balloon.                           - Benign-appearing esophageal stenosis. Dilated                            with 18 mm TTS balloon.                           - Z-line regular, 37 cm from the incisors.                           - Gastroesophageal flap valve classified as Hill  Grade III (minimal fold, loose to endoscope, hiatal                            hernia likely).                           - Gastritis. Biopsied.                           - A single mucosal nodule found in the stomach.                            This endoscopically appeared most consistent with a                            inflammatory nodule. Biopsied.                           - A single submucosal nodule found in the fundus of                            the stomach. This was firm and semimobile when                            manipulated with the close forceps. Tunelled bite                            on bite biopsies were again obtained today.                           - Normal incisura, antrum and pylorus.                           - Normal examined duodenum. Recommendation:           - Patient has a contact number available for                            emergencies. The signs and symptoms of potential                            delayed  complications were discussed with the                            patient. Return to normal activities tomorrow.                            Written discharge instructions were provided to the                            patient.                           - Soft diet today then advance diet as tolerated  tomorrow.                           - Continue present medications.                           - Resume famotidine  40 mg twice daily for the time                            being, even if starting Dexilant .                           - Await pathology results.                           - Repeat upper endoscopy PRN for retreatment.                           - Return to GI office in 3 months, or sooner as                            needed. Sandor Flatter, MD 12/08/2024 10:01:21 AM

## 2024-12-11 ENCOUNTER — Telehealth: Payer: Self-pay

## 2024-12-11 DIAGNOSIS — K2100 Gastro-esophageal reflux disease with esophagitis, without bleeding: Secondary | ICD-10-CM

## 2024-12-11 DIAGNOSIS — C3411 Malignant neoplasm of upper lobe, right bronchus or lung: Secondary | ICD-10-CM

## 2024-12-11 NOTE — Telephone Encounter (Signed)
 Pt called in stating that the Belsomra  and Dexlansoprazole  is not covered by her insurance and she would like to know if Dr Timmy can send in something else that is similar to these. Per Dr Timmy ok to send in Nexium 40 mg daily and Lunesta 2 mg daily at bedtime. Pt advised - erx sent to Dr E for sign off.

## 2024-12-11 NOTE — Telephone Encounter (Signed)
" °  Follow up Call-     12/08/2024    9:13 AM 06/13/2024    7:06 AM  Call back number  Post procedure Call Back phone  # [phone]  Permission to leave phone message Yes Yes     Patient questions:  Do you have a fever, pain , or abdominal swelling? No. Pain Score  0 *  Have you tolerated food without any problems? Yes.    Have you been able to return to your normal activities? Yes.    Do you have any questions about your discharge instructions: Diet   No. Medications  No. Follow up visit  No.  Do you have questions or concerns about your Care? No.  Actions: * If pain score is 4 or above: No action needed, pain <4.   "

## 2024-12-11 NOTE — Telephone Encounter (Signed)
 Copied from CRM #[id number]. Topic: Appointments - Scheduling Inquiry for Clinic >> Dec 11, 2024  1:19 PM Zy'onna H wrote: Reason for CRM: Patient called in to inquire what kind of appt she has 12/19/2024 - I was able to confirm that it is a New PT Visit with Wendolyn.   Patient asked if she has a copay, I was able to verify and advised she may have one at point of service.  The call dropped.   Please Follow Up with PT regarding Copay to confirm.

## 2024-12-12 NOTE — Telephone Encounter (Signed)
Informed pt of co pay

## 2024-12-15 ENCOUNTER — Other Ambulatory Visit: Payer: Self-pay

## 2024-12-15 LAB — SURGICAL PATHOLOGY

## 2024-12-17 ENCOUNTER — Other Ambulatory Visit: Payer: Self-pay

## 2024-12-19 ENCOUNTER — Encounter: Payer: Self-pay | Admitting: Family Medicine

## 2024-12-19 ENCOUNTER — Ambulatory Visit: Admitting: Family Medicine

## 2024-12-19 ENCOUNTER — Ambulatory Visit: Payer: Self-pay | Admitting: Gastroenterology

## 2024-12-19 ENCOUNTER — Other Ambulatory Visit: Payer: Self-pay | Admitting: Hematology & Oncology

## 2024-12-19 VITALS — BP 134/80 | HR 76 | Temp 97.3°F | Ht 64.0 in | Wt 168.2 lb

## 2024-12-19 DIAGNOSIS — Z1159 Encounter for screening for other viral diseases: Secondary | ICD-10-CM

## 2024-12-19 DIAGNOSIS — Z131 Encounter for screening for diabetes mellitus: Secondary | ICD-10-CM | POA: Diagnosis not present

## 2024-12-19 DIAGNOSIS — Z13220 Encounter for screening for lipoid disorders: Secondary | ICD-10-CM | POA: Diagnosis not present

## 2024-12-19 DIAGNOSIS — Z0000 Encounter for general adult medical examination without abnormal findings: Secondary | ICD-10-CM

## 2024-12-19 LAB — LIPID PANEL
Cholesterol: 179 mg/dL (ref 28–200)
HDL: 36.6 mg/dL — ABNORMAL LOW
LDL Cholesterol: 104 mg/dL — ABNORMAL HIGH (ref 10–99)
NonHDL: 142.78
Total CHOL/HDL Ratio: 5
Triglycerides: 192 mg/dL — ABNORMAL HIGH (ref 10.0–149.0)
VLDL: 38.4 mg/dL (ref 0.0–40.0)

## 2024-12-19 LAB — HEMOGLOBIN A1C: Hgb A1c MFr Bld: 6.2 % (ref 4.6–6.5)

## 2024-12-19 NOTE — Progress Notes (Signed)
 " Phone [phone]   Subjective:   Patient is a 59 y.o. female presenting for annual physical.    Chief Complaint  Patient presents with   Establish Care    Pt is here to establish care    Discussed the use of AI scribe software for clinical note transcription with the patient, who gave verbal consent to proceed.  History of Present Illness Tammy Cordova is a 59 year old female with COPD, asthma, small cell lung cancer, and rheumatoid arthritis who presents for a new patient visit and annual physical exam.  She has experienced a persistent runny nose for the past week, alternating between nostrils. No sore throat, fever, facial pain, or cough. She has not taken any antihistamines for this symptom.  She experienced an episode of feeling extremely hot and short of breath after taking a verzenio pill on Saturday, which resolved after sitting outside. She had been eating potato chips but had not had a full meal prior to the episode. No major headaches, dizziness, chest pain, heart racing, or swelling in the legs. She reports occasional difficulty exercising due to shortness of breath and fatigue.  She has a history of esophageal dilation due to radiation therapy affecting her throat, which has resolved her previous issue of food getting stuck. No trouble urinating, vaginal bleeding, or discharge. No depression, suicidal thoughts, muscle aches, or joint pains.  She uses [name]  to stimulate her appetite, as she previously had no interest in food. She acknowledges weight gain and attributes it to increased appetite from [name]  and consumption of sodas and chips. She is trying to balance her diet and exercise, noting difficulty due to her lung condition.    See problem oriented charting- ROS- ROS: Gen: no fever, chills  Skin: no rash, itching ENT: no ear pain, ear drainage, nasal congestion, sinus pressure, sore throat Eyes: no blurry vision, double vision Resp: no cough,  wheeze,SOB CV: no CP, palpitations, LE edema,  GI: no heartburn, n/v/d/c, abd pain GU: no dysuria, urgency, frequency, hematuria MSK: no joint pain, myalgias, back pain Neuro: no dizziness, headache, weakness, vertigo Psych: no depression, anxiety, insomnia, SI   The following were reviewed and entered/updated in epic: Past Medical History:  Diagnosis Date   Asthma    COPD (chronic obstructive pulmonary disease) (HCC)    GERD (gastroesophageal reflux disease)    Lung cancer (HCC)    small cell 2024   Lupus (systemic lupus erythematosus) (HCC)    Pneumonia    RA (rheumatoid arthritis) (HCC)    Vitamin D  deficiency    Patient Active Problem List   Diagnosis Date Noted   Anemia of chronic disease 12/07/2024   Degeneration of lumbar intervertebral disc 12/07/2024   Gastroesophageal reflux disease 12/07/2024   Herpesviral infection, unspecified 12/07/2024   Hypertension 12/07/2024   Iron  deficiency anemia due to chronic blood loss 12/07/2024   Leucopenia 12/07/2024   Rheumatoid arthritis (HCC) 12/07/2024   Malignant neoplasm of upper lobe, right bronchus or lung (HCC) 12/07/2024   Menopause present 12/07/2024   Prediabetes 12/07/2024   Tobacco dependence 12/07/2024   Urinary incontinence 12/07/2024   Malnutrition of moderate degree 12/21/2023   Hypokalemia 12/18/2023   Esophagitis, acute 12/17/2023   Small cell carcinoma of upper lobe of right lung (HCC) 12/02/2023   Hypocalcemia 12/02/2023   Failure to thrive in adult 12/02/2023   Mass of lung 11/25/2023   Left thyroid  nodule 11/25/2023   Lung mass 11/25/2023   Nontraumatic subluxation of extensor tendon at  MCP joint of hand, right 01/17/2020   Swan-neck deformity of finger, right 01/17/2020   Chronic obstructive pulmonary disease (HCC) 07/28/2019   Acute respiratory failure with hypoxia (HCC) 07/28/2019   CAP (community acquired pneumonia) 07/25/2019   Sepsis (HCC) 07/25/2019   Encounter for screening for HIV  07/25/2019   Cigarette smoker 07/25/2019   Headache 07/25/2019   Past Surgical History:  Procedure Laterality Date   BREAST BIOPSY Left 04/2016   REACTIVE LYMPHOID HYPERPLASIA    BUNIONECTOMY Bilateral    bilateral   IR IMAGING GUIDED PORT INSERTION  12/03/2023    Family History  Problem Relation Age of Onset   Diabetes Mother    Hypertension Mother    Diabetes Father    Hypertension Father    Prostate cancer Father    Hypertension Brother    Diabetes Brother    Stroke Brother    Breast cancer Paternal Grandmother 43   Cancer Paternal Grandfather        type unknown   Colon cancer Neg Hx    Esophageal cancer Neg Hx    Stomach cancer Neg Hx    Rectal cancer Neg Hx     Medications- reviewed and updated Current Outpatient Medications  Medication Sig Dispense Refill   albuterol  (VENTOLIN  HFA) 108 (90 Base) MCG/ACT inhaler Inhale 1-2 puffs into the lungs every 6 (six) hours as needed for wheezing or shortness of breath. 8 g 0   ALPRAZolam  (XANAX ) 0.5 MG tablet Take 1 tablet (0.5 mg total) by mouth daily. Please take half hour before radiation therapy. 21 tablet 0   cyclobenzaprine  (FLEXERIL ) 10 MG tablet Take 1 tablet (10 mg total) by mouth 2 (two) times daily as needed for muscle spasms. 20 tablet 0   Dexlansoprazole  30 MG capsule DR Take 1 capsule (30 mg total) by mouth daily. 30 capsule 6   dronabinol  ([name] ) 2.5 MG capsule Take 1 capsule (2.5 mg total) by mouth 2 (two) times daily before lunch and supper. 60 capsule 0   famotidine  (PEPCID ) 40 MG tablet Take 1 tablet (40 mg total) by mouth 2 (two) times daily. 60 tablet 3   folic acid  (FOLVITE ) 1 MG tablet Take 1 tablet by mouth once daily 30 tablet 0   ibuprofen  (ADVIL ) 600 MG tablet Take 1 tablet (600 mg total) by mouth every 6 (six) hours as needed. 30 tablet 0   ondansetron  (ZOFRAN ) 8 MG tablet Take 1 tablet (8 mg total) by mouth every 8 (eight) hours as needed for nausea or vomiting. Start on third day after  Carboplatin  30 tablet 1   prochlorperazine  (COMPAZINE ) 10 MG tablet TAKE 1 TABLET BY MOUTH EVERY 6 HOURS AS NEEDED FOR NAUSEA FOR VOMITING 30 tablet 0   Suvorexant  (BELSOMRA ) 15 MG TABS Take 1 tablet (15 mg total) by mouth at bedtime as needed. 30 tablet 3   valACYclovir (VALTREX) 500 MG tablet Take 500 mg by mouth 2 (two) times daily.     Vitamin D , Ergocalciferol , (DRISDOL ) 1.25 MG (50000 UNIT) CAPS capsule Take 1 capsule by mouth once a week 4 capsule 0   No current facility-administered medications for this visit.    Allergies-reviewed and updated Allergies[1]  Social History   Social History Narrative   Disabled-lung ca.  Assembly work   Objective  Objective:  BP 134/80 (BP Location: Left Arm, Patient Position: Sitting)   Pulse 76   Temp (!) 97.3 F (36.3 C) (Temporal)   Ht 5' 4 (1.626 m)   Wt 168  lb 4 oz (76.3 kg)   LMP  (LMP Unknown)   SpO2 98%   BMI 28.88 kg/m  Physical Exam  Gen: WDWN NAD HEENT: NCAT, conjunctiva not injected, sclera nonicteric TM WNL B, OP moist, no exudates  NECK:  supple, no thyromegaly, no nodes, no carotid bruits CARDIAC: RRR, S1S2+, no murmur. DP 2+B LUNGS: CTAB. No wheezes ABDOMEN:  BS+, soft, NTND, No HSM, no masses EXT:  no edema MSK: no gross abnormalities. MS 5/5 all 4.  RA changes in hands NEURO: A&O x3.  CN II-XII intact.  PSYCH: normal mood. Good eye contact   Reviewed onc labs and [facility] notes    Assessment and Plan   Health Maintenance counseling: 1. Anticipatory guidance: Patient counseled regarding regular dental exams q6 months, eye exams,  avoiding smoking and second hand smoke, limiting alcohol to 1 beverage per day, no illicit drugs.   2. Risk factor reduction:  Advised patient of need for regular exercise and diet rich and fruits and vegetables to reduce risk of heart attack and stroke. Exercise- encouraged.  Wt Readings from Last 3 Encounters:  12/19/24 168 lb 4 oz (76.3 kg)  12/08/24 158 lb 1.9 oz (71.7 kg)   12/07/24 164 lb (74.4 kg)   3. Immunizations/screenings/ancillary studies Immunization History  Administered Date(s) Administered   Hep A / Hep B 03/09/2024, 06/02/2024   Janssen (J&J) SARS-COV-2 Vaccination 02/27/2020   Moderna Sars-Covid-2 Vaccination 10/11/2020, 10/08/2023, 03/09/2024   PNEUMOCOCCAL CONJUGATE-20 10/08/2023   Pneumococcal Polysaccharide-23 04/03/2016   Tdap 04/03/2016, 06/02/2024   Zoster Recombinant(Shingrix) 10/08/2023, 03/09/2024   Health Maintenance Due  Topic Date Due   Hepatitis C Screening  Never done    4. Cervical cancer screening- 2024 5. Breast cancer screening-  mammogram utd 6. Colon cancer screening - did cologard 7. Skin cancer screening- advised regular sunscreen use. Denies worrisome, changing, or new skin lesions.  8. Birth control/STD check- n/a 9. Osteoporosis screening- n/a 10. Smoking associated screening - former smoker  Wellness examination -     Lipid panel -     Hemoglobin A1c -     Hepatitis C antibody -     HIV Antibody (routine testing w rflx)  Screening for viral disease -     Hepatitis C antibody -     HIV Antibody (routine testing w rflx)    Assessment and Plan Assessment & Plan Annual physical examination and preventive care   An annual physical examination was conducted, and preventive care measures were discussed, focusing on diet and exercise modifications to manage weight and overall health. Blood work, including cholesterol and A1c, was conducted, and HIV screening was performed. Her Pap smear history was reviewed to determine frequency based on HPV status.  Small cell lung cancer   Diagnosed in December 2024, she is currently disabled due to lung cancer but reports a runny nose that has been going on for at least a week. The impact of cancer treatment on overall health and the importance of maintaining physical activity within her limits were discussed. She is encouraged to engage in physical activity, such as  walking short distances multiple times a day.  Chronic obstructive pulmonary disease and asthma   COPD and asthma are managed. The importance of monitoring symptoms and maintaining physical activity as tolerated was discussed. She is encouraged to monitor symptoms and maintain physical activity as tolerated.  Rheumatoid arthritis and systemic lupus erythematosus   Rheumatoid arthritis and lupus are diagnosed, but she is not currently on medication  due to insurance changes. Previous treatment involved self-injection, discontinued after the cancer diagnosis. A follow-up with a rheumatologist is scheduled for next month.  Gastroesophageal reflux disease with esophageal stricture   GERD with esophageal stricture is managed, with a recent EGD performed to address the stricture. No current issues with food impaction are reported. Dietary modifications to manage symptoms were discussed, and she will continue to monitor for symptoms of food impaction or reflux.  Acute upper respiratory infection   She has experienced a runny nose for at least a week, likely due to a cold, with no other symptoms such as congestion, sore throat, or fever. The use of antihistamines like Zyrtec or Claritin to manage symptoms was discussed.  Overweight   Weight gain is noted, possibly related to increased appetite from [name]  use. Dietary habits and the impact of high-calorie foods and beverages on weight were discussed. She is encouraged to modify her diet to reduce high-calorie intake and engage in regular physical activity within her limits.  Screening for viral disease   Screening for viral diseases, including HIV and hepatitis C, was discussed. HIV and hepatitis C screenings were performed.     Recommended follow up: Return in about 1 year (around 12/19/2025) for annual physical.  Lab/Order associations:+ fasting  Tammy [name] Carrel, MD      [1] No Known Allergies  "

## 2024-12-19 NOTE — Patient Instructions (Signed)
Welcome to [facility] at [facility]! It was a pleasure meeting you today. ° °As discussed, Please schedule a 12 month follow up visit today. ° °PLEASE NOTE: ° °If you had any LAB tests please let us know if you have not heard back within a few days. You may see your results on MyChart before we have a chance to review them but we will give you a call once they are reviewed by us. If we ordered any REFERRALS today, please let us know if you have not heard from their office within the next week.  °Let us know through MyChart if you are needing REFILLS, or have your pharmacy send us the request. You can also use MyChart to communicate with me or any office staff. ° °Please try these tips to maintain a healthy lifestyle: ° °Eat most of your calories during the day when you are active. Eliminate processed foods including packaged sweets (pies, cakes, cookies), reduce intake of potatoes, white bread, white pasta, and white rice. Look for whole grain options, oat flour or almond flour. ° °Each meal should contain half fruits/vegetables, one quarter protein, and one quarter carbs (no bigger than a computer mouse). ° °Cut down on sweet beverages. This includes juice, soda, and sweet tea. Also watch fruit intake, though this is a healthier sweet option, it still contains natural sugar! Limit to 3 servings daily. ° °Drink at least 1 glass of water with each meal and aim for at least 8 glasses per day ° °Exercise at least 150 minutes every week.   °

## 2024-12-20 ENCOUNTER — Ambulatory Visit: Payer: Self-pay | Admitting: Family Medicine

## 2024-12-20 ENCOUNTER — Other Ambulatory Visit: Payer: Self-pay

## 2024-12-20 LAB — HEPATITIS C ANTIBODY: Hepatitis C Ab: NONREACTIVE

## 2024-12-20 LAB — HIV ANTIBODY (ROUTINE TESTING W REFLEX)
HIV 1&2 Ab, 4th Generation: NONREACTIVE
HIV FINAL INTERPRETATION: NEGATIVE

## 2024-12-20 NOTE — Progress Notes (Signed)
"   Your cholesterol levels are elevated.  Work on low cholesterol and lower carbs/sugars diet and  get exercise to try to lower your cholesterol.  A1C(3 month average of sugars) is elevated.  This is considered PreDiabetes.  Work on diet-decrease sugars and starches and aim for 30 minutes of exercise 5 days/week to prevent progression to diabetes  Sch appt in 6 months w/me and be fasting so we can monitor this "

## 2024-12-21 ENCOUNTER — Other Ambulatory Visit: Payer: Self-pay

## 2024-12-21 DIAGNOSIS — C3411 Malignant neoplasm of upper lobe, right bronchus or lung: Secondary | ICD-10-CM

## 2024-12-21 NOTE — Progress Notes (Signed)
 Called pt and notified.

## 2024-12-21 NOTE — Progress Notes (Signed)
 Per Dr. Timmy, PET scan denied. Verbal order received for patient to have CT Chest/Abdomen/Pelvis with contrast. [facility] notified of change.

## 2024-12-26 ENCOUNTER — Other Ambulatory Visit: Payer: Self-pay

## 2024-12-26 DIAGNOSIS — K2100 Gastro-esophageal reflux disease with esophagitis, without bleeding: Secondary | ICD-10-CM

## 2024-12-26 DIAGNOSIS — C3411 Malignant neoplasm of upper lobe, right bronchus or lung: Secondary | ICD-10-CM

## 2024-12-26 MED ORDER — ESZOPICLONE 2 MG PO TABS: 2.0000 mg | ORAL_TABLET | Freq: Every evening | ORAL | 3 refills | Status: AC | PRN

## 2024-12-26 MED ORDER — ESOMEPRAZOLE MAGNESIUM 40 MG PO CPDR: 40.0000 mg | DELAYED_RELEASE_CAPSULE | Freq: Every day | ORAL | 3 refills | Status: DC

## 2024-12-26 NOTE — Telephone Encounter (Signed)
 Pt called in stating she never received her Lunesta  or Nexium  prescription. I will send in the Nexium  Rx - Dr E to send in Lunesta .  Awaiting pt to [facility] with requested pharmacy to send to.

## 2024-12-26 NOTE — Telephone Encounter (Signed)
 Pt would like Rx's to go to [facility] on [address].

## 2024-12-27 ENCOUNTER — Other Ambulatory Visit: Payer: Self-pay

## 2024-12-27 ENCOUNTER — Other Ambulatory Visit: Payer: Self-pay | Admitting: Hematology & Oncology

## 2024-12-27 ENCOUNTER — Encounter: Payer: Self-pay | Admitting: Hematology & Oncology

## 2024-12-27 DIAGNOSIS — C3411 Malignant neoplasm of upper lobe, right bronchus or lung: Secondary | ICD-10-CM

## 2024-12-27 NOTE — Progress Notes (Signed)
 Resubmitting PET scan order. Ordered originally and then got a denial so ordered CT as requested. Called [facility] and spoke with a pharmacist to do a P2P and she stated CT was denied because PET was approved but under another provider (Dr Lanny @ [facility]). Asked if we could change provider on auth and she said no. She said order was entered in their system by [facility] and that we can still use the authorization #. Advised her we would have to order another PET in our system for scheduling purposes and she said we may receive another denial letter but to void it. PET scan reordered.  Auth # [id number]

## 2024-12-28 ENCOUNTER — Other Ambulatory Visit: Payer: Self-pay

## 2024-12-28 DIAGNOSIS — K3189 Other diseases of stomach and duodenum: Secondary | ICD-10-CM

## 2024-12-29 ENCOUNTER — Encounter (HOSPITAL_COMMUNITY)

## 2024-12-29 ENCOUNTER — Encounter (HOSPITAL_BASED_OUTPATIENT_CLINIC_OR_DEPARTMENT_OTHER)

## 2024-12-29 ENCOUNTER — Inpatient Hospital Stay

## 2024-12-29 ENCOUNTER — Ambulatory Visit (HOSPITAL_BASED_OUTPATIENT_CLINIC_OR_DEPARTMENT_OTHER)

## 2024-12-29 ENCOUNTER — Encounter (HOSPITAL_BASED_OUTPATIENT_CLINIC_OR_DEPARTMENT_OTHER): Payer: Self-pay

## 2024-12-31 ENCOUNTER — Encounter: Payer: Self-pay | Admitting: Hematology & Oncology

## 2025-01-02 ENCOUNTER — Other Ambulatory Visit (HOSPITAL_COMMUNITY): Payer: Self-pay

## 2025-01-02 ENCOUNTER — Encounter: Payer: Self-pay | Admitting: Hematology & Oncology

## 2025-01-02 ENCOUNTER — Telehealth: Payer: Self-pay

## 2025-01-03 ENCOUNTER — Inpatient Hospital Stay: Admitting: Hematology & Oncology

## 2025-01-03 ENCOUNTER — Inpatient Hospital Stay

## 2025-01-03 ENCOUNTER — Inpatient Hospital Stay: Attending: Hematology & Oncology

## 2025-01-03 VITALS — BP 112/68 | HR 90 | Resp 18

## 2025-01-03 VITALS — BP 134/66 | HR 96 | Temp 97.6°F | Resp 19 | Wt 167.1 lb

## 2025-01-03 DIAGNOSIS — C3411 Malignant neoplasm of upper lobe, right bronchus or lung: Secondary | ICD-10-CM | POA: Diagnosis not present

## 2025-01-03 DIAGNOSIS — K2100 Gastro-esophageal reflux disease with esophagitis, without bleeding: Secondary | ICD-10-CM

## 2025-01-03 LAB — CMP (CANCER CENTER ONLY)
ALT: <5 U/L (ref 0–44)
AST: 12 U/L — ABNORMAL LOW (ref 15–41)
Albumin: 3.8 g/dL (ref 3.5–5.0)
Alkaline Phosphatase: 92 U/L (ref 38–126)
Anion gap: 11 (ref 5–15)
BUN: 9 mg/dL (ref 6–20)
CO2: 25 mmol/L (ref 22–32)
Calcium: 9 mg/dL (ref 8.9–10.3)
Chloride: 106 mmol/L (ref 98–111)
Creatinine: 0.67 mg/dL (ref 0.44–1.00)
GFR, Estimated: >60 mL/min
Glucose, Bld: 124 mg/dL — ABNORMAL HIGH (ref 70–99)
Potassium: 3.7 mmol/L (ref 3.5–5.1)
Sodium: 142 mmol/L (ref 135–145)
Total Bilirubin: 0.5 mg/dL (ref 0.0–1.2)
Total Protein: 8.1 g/dL (ref 6.5–8.1)

## 2025-01-03 LAB — CBC WITH DIFFERENTIAL (CANCER CENTER ONLY)
Abs Immature Granulocytes: 0.01 10*3/uL (ref 0.00–0.07)
Basophils Absolute: 0 10*3/uL (ref 0.0–0.1)
Basophils Relative: 0 %
Eosinophils Absolute: 0.4 10*3/uL (ref 0.0–0.5)
Eosinophils Relative: 10 %
HCT: 39 % (ref 36.0–46.0)
Hemoglobin: 12.5 g/dL (ref 12.0–15.0)
Immature Granulocytes: 0 %
Lymphocytes Relative: 19 %
Lymphs Abs: 0.8 10*3/uL (ref 0.7–4.0)
MCH: 28.9 pg (ref 26.0–34.0)
MCHC: 32.1 g/dL (ref 30.0–36.0)
MCV: 90.3 fL (ref 80.0–100.0)
Monocytes Absolute: 0.1 10*3/uL (ref 0.1–1.0)
Monocytes Relative: 3 %
Neutro Abs: 2.7 10*3/uL (ref 1.7–7.7)
Neutrophils Relative %: 68 %
Platelet Count: 193 10*3/uL (ref 150–400)
RBC: 4.32 MIL/uL (ref 3.87–5.11)
RDW: 15.1 % (ref 11.5–15.5)
WBC Count: 4 10*3/uL (ref 4.0–10.5)
nRBC: 0 % (ref 0.0–0.2)

## 2025-01-03 LAB — LACTATE DEHYDROGENASE: LDH: 222 U/L (ref 105–235)

## 2025-01-03 LAB — TSH: TSH: 1.45 u[IU]/mL (ref 0.350–4.500)

## 2025-01-03 MED ORDER — SODIUM CHLORIDE 0.9 % IV SOLN
1200.0000 mg | Freq: Once | INTRAVENOUS | Status: AC
  Administered 2025-01-03: 1200 mg via INTRAVENOUS
  Filled 2025-01-03: qty 20

## 2025-01-03 MED ORDER — PROCHLORPERAZINE MALEATE 10 MG PO TABS: 10.0000 mg | ORAL_TABLET | Freq: Four times a day (QID) | ORAL | 5 refills | Status: AC | PRN

## 2025-01-03 MED ORDER — ESOMEPRAZOLE MAGNESIUM 40 MG PO CPDR: 40.0000 mg | DELAYED_RELEASE_CAPSULE | Freq: Every day | ORAL | 3 refills | Status: AC

## 2025-01-03 MED ORDER — ALPRAZOLAM 0.5 MG PO TABS: 0.5000 mg | ORAL_TABLET | Freq: Every day | ORAL | 0 refills | Status: AC

## 2025-01-03 MED ORDER — SODIUM CHLORIDE 0.9 % IV SOLN: INTRAVENOUS | Status: DC

## 2025-01-03 NOTE — Patient Instructions (Signed)
 Device With a Small Disc Placed for Long-Term IV Use (Implanted Port): Care at Home An implanted port is a device with a small disc that's put under your skin. In most cases, it's placed in your chest. It can be used to draw blood and send medicines and fluids quickly into your bloodstream. You may need a port to give you: IV medicines that would bother the small veins in your hands or arms. IV medicines for a long time, such as medicines to kill cancer cells (chemotherapy). IV liquid nutrition for a long time. An implanted port has 2 main parts: The portal. This is the round disc where the needle is put in. It may look like a small, raised area under your skin. The catheter. This is a soft tube that connects the port to a vein. You may be able to do more everyday tasks with a port than with other types of long-term IVs. How is my port accessed?  A numbing cream may be put on the skin over the port site. Your skin will be cleaned with a solution that kills germs. Your health care provider will gently pinch the port and put a needle in it. The port will be checked to make sure it's in the vein and working. If you need to get medicine all the time, the needle may be left in the port. Your provider will put a clear bandage over the needle site. The bandage and needle will need to be changed each week. What is flushing? Flushing helps keep the port working like it should. Flush your port as told. If your port is only used from time to time to give medicines or draw blood, you may need to flush it: Before and after medicines have been given. Before and after blood has been drawn. Every 4-6 weeks to keep it working well. If you get medicine through your port all the time, you may not need to flush it. Talk with your provider to learn more. How long will my port stay in? The port will stay in for as long as it's needed. When it's time for it to come out, you'll have a procedure to remove it. Follow  these instructions at home: Caring for your port and port site Flush your port as told. If you need to get an infusion of medicine over a few days, take care of your port as told. Make sure you: Take care of your port site. Wash your hands with soap and water  for at least 20 seconds before and after you change your bandage. If you can't use soap and water , use hand sanitizer. Put any used bandages or infusion bags in a plastic bag. Throw the bag in the trash. Keep the bandage that covers the needle clean and dry. Do not let it get wet. Do not use scissors or sharp objects near the tubes. Keep any tubes clamped, unless they're being used. Check the area around your port site every day for signs of infection. Check for: Redness, swelling, or pain. Fluid or blood. Warmth. Pus or a bad smell. Protect the skin around the port site. Avoid wearing bra straps that rub or irritate the site. Protect the skin around your port from seat belts. Place a soft pad over your chest if needed. Do not take baths, swim, or use a hot tub until you're told it's OK. Ask if you can shower. General instructions  Throw away any syringes in a container that's meant for sharp  items (sharps container). You can buy a sharps container from a pharmacy. You can also make one by using an empty, hard plastic bottle with a lid. Always carry a medical alert card or wear a medical alert bracelet. Ask what things are safe for you to do at home. Ask when you can go back to work or school. Where to find more information To learn more, go to: American Cancer Society at prombar.it. Click on the magnifying glass and type IV lines and ports. Find the link you need. American Society of Clinical Oncology at fabvets.de. Click on the magnifying glass and type getting chemo infusions. Find the link you need. Contact a health care provider if: You can't flush your port. You can't draw blood from the port. You have a fever or  chills. You have any signs of infection. You have swelling in your arm, neck, or shoulder. This information is not intended to replace advice given to you by your health care provider. Make sure you discuss any questions you have with your health care provider. Document Revised: 08/29/2024 Document Reviewed: 08/29/2024 Elsevier Patient Education  2025 [employer].

## 2025-01-03 NOTE — Progress Notes (Unsigned)
 "  Office Visit Note  Patient: Tammy Cordova             Date of Birth: 25-Jun-1966           MRN: [id number]             PCP: Wendolyn Jenkins Jansky, MD Referring: Ishmael Slough, MD Visit Date: 01/04/2025 Occupation: Data Unavailable  Subjective:  No chief complaint on file.   History of Present Illness: Tammy Cordova is a 59 y.o. female ***     Activities of Daily Living:  Patient reports morning stiffness for *** {minute/hour:19697}.   Patient {ACTIONS;DENIES/REPORTS:21021675::Denies} nocturnal pain.  Difficulty dressing/grooming: {ACTIONS;DENIES/REPORTS:21021675::Denies} Difficulty climbing stairs: {ACTIONS;DENIES/REPORTS:21021675::Denies} Difficulty getting out of chair: {ACTIONS;DENIES/REPORTS:21021675::Denies} Difficulty using hands for taps, buttons, cutlery, and/or writing: {ACTIONS;DENIES/REPORTS:21021675::Denies}  No Rheumatology ROS completed.   PMFS History:  Patient Active Problem List   Diagnosis Date Noted   Anemia of chronic disease 12/07/2024   Degeneration of lumbar intervertebral disc 12/07/2024   Gastroesophageal reflux disease 12/07/2024   Herpesviral infection, unspecified 12/07/2024   Hypertension 12/07/2024   Iron  deficiency anemia due to chronic blood loss 12/07/2024   Leucopenia 12/07/2024   Rheumatoid arthritis (HCC) 12/07/2024   Malignant neoplasm of upper lobe, right bronchus or lung (HCC) 12/07/2024   Menopause present 12/07/2024   Prediabetes 12/07/2024   Tobacco dependence 12/07/2024   Urinary incontinence 12/07/2024   Malnutrition of moderate degree 12/21/2023   Hypokalemia 12/18/2023   Esophagitis, acute 12/17/2023   Small cell carcinoma of upper lobe of right lung (HCC) 12/02/2023   Hypocalcemia 12/02/2023   Failure to thrive in adult 12/02/2023   Mass of lung 11/25/2023   Left thyroid  nodule 11/25/2023   Lung mass 11/25/2023   Nontraumatic subluxation of extensor tendon at MCP joint of hand, right 01/17/2020   Swan-neck  deformity of finger, right 01/17/2020   Chronic obstructive pulmonary disease (HCC) 07/28/2019   Acute respiratory failure with hypoxia (HCC) 07/28/2019   CAP (community acquired pneumonia) 07/25/2019   Sepsis (HCC) 07/25/2019   Encounter for screening for HIV 07/25/2019   Cigarette smoker 07/25/2019   Headache 07/25/2019    Past Medical History:  Diagnosis Date   Asthma    COPD (chronic obstructive pulmonary disease) (HCC)    GERD (gastroesophageal reflux disease)    Lung cancer (HCC)    small cell 2024   Lupus (systemic lupus erythematosus) (HCC)    Pneumonia    RA (rheumatoid arthritis) (HCC)    Vitamin D  deficiency     Family History  Problem Relation Age of Onset   Diabetes Mother    Hypertension Mother    Diabetes Father    Hypertension Father    Prostate cancer Father    Hypertension Brother    Diabetes Brother    Stroke Brother    Breast cancer Paternal Grandmother 60   Cancer Paternal Grandfather        type unknown   Colon cancer Neg Hx    Esophageal cancer Neg Hx    Stomach cancer Neg Hx    Rectal cancer Neg Hx    Past Surgical History:  Procedure Laterality Date   BREAST BIOPSY Left 04/2016   REACTIVE LYMPHOID HYPERPLASIA    BUNIONECTOMY Bilateral    bilateral   IR IMAGING GUIDED PORT INSERTION  12/03/2023   Social History[1] Social History   Social History Narrative   Disabled-lung ca.  Assembly work     Immunization History  Administered Date(s) Administered   Hep A /  Hep B 03/09/2024, 06/02/2024   Janssen (J&J) SARS-COV-2 Vaccination 02/27/2020   Moderna Sars-Covid-2 Vaccination 10/11/2020, 10/08/2023, 03/09/2024   PNEUMOCOCCAL CONJUGATE-20 10/08/2023   Pneumococcal Polysaccharide-23 04/03/2016   Tdap 04/03/2016, 06/02/2024   Zoster Recombinant(Shingrix) 10/08/2023, 03/09/2024     Objective: Vital Signs: LMP  (LMP Unknown)    Physical Exam   Musculoskeletal Exam: ***   Investigation: No additional findings.  Imaging: No  results found.  Recent Labs: Lab Results  Component Value Date   WBC 4.0 01/03/2025   HGB 12.5 01/03/2025   PLT 193 01/03/2025   NA 142 01/03/2025   K 3.7 01/03/2025   CL 106 01/03/2025   CO2 25 01/03/2025   GLUCOSE 124 (H) 01/03/2025   BUN 9 01/03/2025   CREATININE 0.67 01/03/2025   BILITOT 0.5 01/03/2025   ALKPHOS 92 01/03/2025   AST 12 (L) 01/03/2025   ALT <5 01/03/2025   PROT 8.1 01/03/2025   ALBUMIN 3.8 01/03/2025   CALCIUM  9.0 01/03/2025   GFRAA >60 07/30/2019    Speciality Comments: No specialty comments available.  Procedures:  No procedures performed Allergies: Patient has no known allergies.   Assessment / Plan:     Visit Diagnoses:  Assessment & Plan  ***  Follow-Up Instructions: No follow-ups on file.   [name], MD  Note - This record has been created using [employer].  Chart creation errors have been sought, but may not always  have been located. Such creation errors do not reflect on  the standard of medical care.    [1]  Social History Tobacco Use   Smoking status: Former    Current packs/day: 0.00    Average packs/day: 0.3 packs/day for 36.0 years (10.8 ttl pk-yrs)    Types: Cigarettes    Quit date: 11/23/2023    Years since quitting: 1.1   Smokeless tobacco: Never   Tobacco comments:    Quit smoking the Saturday before Christmas 2024  Vaping Use   Vaping status: Never Used  Substance Use Topics   Alcohol use: No   Drug use: Not Currently    Types: Marijuana    Comment: last used December 2024   "

## 2025-01-03 NOTE — Telephone Encounter (Signed)
 Received a notification regarding Prior Authorization from [employer] for Esomprazole. Authorization has been DENIED due to the following reason  .  Please advise if you wish us  to pursue an appeal, thanks!  Med Access Team  Charlott Hamilton,  CPhT-Adv  she/her/hers [facility]  [facility] Pharmacy Technician Patient Advocate Specialist [facility]  Phone: [phone]  Fax: [phone] [email or web address]

## 2025-01-03 NOTE — Progress Notes (Signed)
 " Hematology and Oncology Follow Up Visit  Tammy Cordova [id number] Sep 10, 1966 59 y.o. 01/03/2025   Principle Diagnosis:  Extensive stage small cell lung cancer --TMB=23  Current Therapy:   Status post cycle 1 of carboplatinum/etoposide  + XRT s/p cycle #4 -- start on 12/04/2023 Tecentriq  - maintenance s/p cycle #9 -- start on 03/16/2024 PCI -start on 04/10/2024 -completed on 04/27/2024     Interim History:  Tammy Cordova is back for follow-up.  She is doing much better.  We last saw our back in early January.  Since then, everything been going okay for her.  Apparently, she is going to have an upper endoscopy in March.  She  had upper endoscopy in early January.  There is stenosis found in the esophagus.  It looks like it was secondary to radiation.  She has some nodules that were noted.  Biopsies were taken.  The pathology report [id number]) showed some gastritis.  There is no evidence of malignancy.  She has had no change in bowel or bladder habits.  She has had no nausea or vomiting.  She has had no cough.  She is worried about some weight gain.  When we last saw her, her TSH was 1.4.  She has had no bleeding.  There is been no leg swelling.  She has had no rashes.  Overall, I would say that her performance status is probably ECOG 1.   .   Wt Readings from Last 3 Encounters:  01/03/25 167 lb 1.6 oz (75.8 kg)  12/19/24 168 lb 4 oz (76.3 kg)  12/08/24 158 lb 1.9 oz (71.7 kg)     Medications:  Current Outpatient Medications:    albuterol  (VENTOLIN  HFA) 108 (90 Base) MCG/ACT inhaler, Inhale 1-2 puffs into the lungs every 6 (six) hours as needed for wheezing or shortness of breath., Disp: 8 g, Rfl: 0   ALPRAZolam  (XANAX ) 0.5 MG tablet, Take 1 tablet (0.5 mg total) by mouth daily. Please take half hour before radiation therapy., Disp: 21 tablet, Rfl: 0   cyclobenzaprine  (FLEXERIL ) 10 MG tablet, Take 1 tablet (10 mg total) by mouth 2 (two) times daily as needed for muscle spasms.,  Disp: 20 tablet, Rfl: 0   Dexlansoprazole  30 MG capsule DR, Take 1 capsule (30 mg total) by mouth daily., Disp: 30 capsule, Rfl: 6   dronabinol  ([name] ) 2.5 MG capsule, Take 1 capsule (2.5 mg total) by mouth 2 (two) times daily before lunch and supper., Disp: 60 capsule, Rfl: 0   esomeprazole  (NEXIUM ) 40 MG capsule, Take 1 capsule (40 mg total) by mouth daily at 12 noon., Disp: 30 capsule, Rfl: 3   eszopiclone  (LUNESTA ) 2 MG TABS tablet, Take 1 tablet (2 mg total) by mouth at bedtime as needed for sleep. Take immediately before bedtime, Disp: 30 tablet, Rfl: 3   famotidine  (PEPCID ) 40 MG tablet, Take 1 tablet (40 mg total) by mouth 2 (two) times daily., Disp: 60 tablet, Rfl: 3   folic acid  (FOLVITE ) 1 MG tablet, Take 1 tablet by mouth once daily, Disp: 30 tablet, Rfl: 0   ibuprofen  (ADVIL ) 600 MG tablet, Take 1 tablet (600 mg total) by mouth every 6 (six) hours as needed., Disp: 30 tablet, Rfl: 0   ondansetron  (ZOFRAN ) 8 MG tablet, Take 1 tablet (8 mg total) by mouth every 8 (eight) hours as needed for nausea or vomiting. Start on third day after Carboplatin , Disp: 30 tablet, Rfl: 1   prochlorperazine  (COMPAZINE ) 10 MG tablet, TAKE 1 TABLET BY MOUTH  EVERY 6 HOURS AS NEEDED FOR NAUSEA FOR VOMITING, Disp: 30 tablet, Rfl: 0   valACYclovir (VALTREX) 500 MG tablet, Take 500 mg by mouth 2 (two) times daily., Disp: , Rfl:    Vitamin D , Ergocalciferol , (DRISDOL ) 1.25 MG (50000 UNIT) CAPS capsule, Take 1 capsule by mouth once a week, Disp: 4 capsule, Rfl: 0  Allergies: No Known Allergies  Past Medical History, Surgical history, Social history, and Family History were reviewed and updated.  Review of Systems: Review of Systems  Constitutional:  Negative for appetite change and unexpected weight change.  HENT:   Negative for sore throat.   Eyes: Negative.   Respiratory: Negative.    Cardiovascular: Negative.   Gastrointestinal:  Negative for nausea.  Endocrine: Negative.   Genitourinary: Negative.     Musculoskeletal: Negative.   Skin: Negative.   Neurological:  Negative for dizziness.  Hematological: Negative.   Psychiatric/Behavioral: Negative.      Physical Exam: Vitals:   01/03/25 0942  BP: 134/66  Pulse: 96  Resp: 19  Temp: 97.6 F (36.4 C)  SpO2: 94%     Wt Readings from Last 3 Encounters:  01/03/25 167 lb 1.6 oz (75.8 kg)  12/19/24 168 lb 4 oz (76.3 kg)  12/08/24 158 lb 1.9 oz (71.7 kg)    Physical Exam Vitals reviewed.  HENT:     Head: Normocephalic and atraumatic.  Eyes:     Pupils: Pupils are equal, round, and reactive to light.  Cardiovascular:     Rate and Rhythm: Normal rate and regular rhythm.     Heart sounds: Normal heart sounds.  Pulmonary:     Effort: Pulmonary effort is normal.     Breath sounds: Normal breath sounds.  Abdominal:     General: Bowel sounds are normal.     Palpations: Abdomen is soft.  Musculoskeletal:        General: No tenderness or deformity. Normal range of motion.     Cervical back: Normal range of motion.  Lymphadenopathy:     Cervical: No cervical adenopathy.  Skin:    General: Skin is warm and dry.     Findings: No erythema or rash.  Neurological:     Mental Status: She is alert and oriented to person, place, and time.  Psychiatric:        Behavior: Behavior normal.        Thought Content: Thought content normal.        Judgment: Judgment normal.      Lab Results  Component Value Date   WBC 4.0 01/03/2025   HGB 12.5 01/03/2025   HCT 39.0 01/03/2025   MCV 90.3 01/03/2025   PLT 193 01/03/2025     Chemistry      Component Value Date/Time   NA 142 01/03/2025 0930   K 3.7 01/03/2025 0930   CL 106 01/03/2025 0930   CO2 25 01/03/2025 0930   BUN 9 01/03/2025 0930   CREATININE 0.67 01/03/2025 0930      Component Value Date/Time   CALCIUM  9.0 01/03/2025 0930   ALKPHOS 92 01/03/2025 0930   AST 12 (L) 01/03/2025 0930   ALT <5 01/03/2025 0930   BILITOT 0.5 01/03/2025 0930      Impression and  Plan: Tammy Cordova is a very charming 59 year old Afro-American female with extensive stage small cell lung cancer.  Because of the bulk of her mediastinal disease, we went ahead and gave radiation with chemotherapy.  Again, she had a very nice response by the  last PET scan.  She had PCI.  I think this has certainly helped her out.  I think that she is probably due for another PET scan.  I think she says she can have 1 done next week.  Hopefully, this will be done.  I am happy that her quality of life is doing well right now.  For right now, we will go ahead and plan to have her come back to see us  in another month. [name] Maude JONELLE Timmy, MD 2/4/202610:48 AM "

## 2025-01-03 NOTE — Patient Instructions (Signed)
 [facility] - A DEPT OF [facility]  Discharge Instructions: Thank you for choosing [facility] to provide your oncology and hematology care.   If you have a lab appointment with the Cancer Center, please go directly to the Cancer Center and check in at the registration area.  Wear comfortable clothing and clothing appropriate for easy access to any Portacath or PICC line.   We strive to give you quality time with your provider. You may need to reschedule your appointment if you arrive late (15 or more minutes).  Arriving late affects you and other patients whose appointments are after yours.  Also, if you miss three or more appointments without notifying the office, you may be dismissed from the clinic at the provider's discretion.      For prescription refill requests, have your pharmacy contact our office and allow 72 hours for refills to be completed.    Today you received the following chemotherapy and/or immunotherapy agents Tecentriq      To help prevent nausea and vomiting after your treatment, we encourage you to take your nausea medication as directed.  BELOW ARE SYMPTOMS THAT SHOULD BE REPORTED IMMEDIATELY: *FEVER GREATER THAN 100.4 F (38 C) OR HIGHER *CHILLS OR SWEATING *NAUSEA AND VOMITING THAT IS NOT CONTROLLED WITH YOUR NAUSEA MEDICATION *UNUSUAL SHORTNESS OF BREATH *UNUSUAL BRUISING OR BLEEDING *URINARY PROBLEMS (pain or burning when urinating, or frequent urination) *BOWEL PROBLEMS (unusual diarrhea, constipation, pain near the anus) TENDERNESS IN MOUTH AND THROAT WITH OR WITHOUT PRESENCE OF ULCERS (sore throat, sores in mouth, or a toothache) UNUSUAL RASH, SWELLING OR PAIN  UNUSUAL VAGINAL DISCHARGE OR ITCHING   Items with * indicate a potential emergency and should be followed up as soon as possible or go to the Emergency Department if any problems should occur.  Please show the CHEMOTHERAPY ALERT CARD or IMMUNOTHERAPY  ALERT CARD at check-in to the Emergency Department and triage nurse. Should you have questions after your visit or need to cancel or reschedule your appointment, please contact [facility] - A DEPT OF [facility]  [phone] and follow the prompts.  Office hours are 8:00 a.m. to 4:30 p.m. Monday - Friday. Please note that voicemails left after 4:00 p.m. may not be returned until the following business day.  We are closed weekends and major holidays. You have access to a nurse at all times for urgent questions. Please call the main number to the clinic [phone] and follow the prompts.  For any non-urgent questions, you may also contact your provider using MyChart. We now offer e-Visits for anyone 74 and older to request care online for non-urgent symptoms. For details visit [email or web address].   Also download the MyChart app! Go to the app store, search "MyChart", open the app, select [place], and log in with your MyChart username and password.

## 2025-01-04 ENCOUNTER — Ambulatory Visit

## 2025-01-04 ENCOUNTER — Ambulatory Visit: Admitting: Internal Medicine

## 2025-01-04 ENCOUNTER — Encounter: Payer: Self-pay | Admitting: Internal Medicine

## 2025-01-04 ENCOUNTER — Telehealth: Payer: Self-pay | Admitting: Pharmacist

## 2025-01-04 VITALS — BP 114/69 | HR 92 | Temp 97.6°F | Resp 16 | Ht 65.0 in | Wt 168.5 lb

## 2025-01-04 DIAGNOSIS — E559 Vitamin D deficiency, unspecified: Secondary | ICD-10-CM | POA: Insufficient documentation

## 2025-01-04 DIAGNOSIS — M069 Rheumatoid arthritis, unspecified: Secondary | ICD-10-CM

## 2025-01-04 DIAGNOSIS — Z79899 Other long term (current) drug therapy: Secondary | ICD-10-CM | POA: Insufficient documentation

## 2025-01-04 DIAGNOSIS — H04129 Dry eye syndrome of unspecified lacrimal gland: Secondary | ICD-10-CM | POA: Insufficient documentation

## 2025-01-04 DIAGNOSIS — H04123 Dry eye syndrome of bilateral lacrimal glands: Secondary | ICD-10-CM

## 2025-01-04 DIAGNOSIS — C3411 Malignant neoplasm of upper lobe, right bronchus or lung: Secondary | ICD-10-CM

## 2025-01-04 NOTE — Assessment & Plan Note (Addendum)
 Tammy Cordova

## 2025-01-04 NOTE — Patient Instructions (Signed)
 [name]

## 2025-01-04 NOTE — Assessment & Plan Note (Addendum)
  Orders:   VITAMIN D  25 Hydroxy (Vit-D Deficiency, Fractures)

## 2025-01-04 NOTE — Assessment & Plan Note (Addendum)
" °  Orders:   XR Hand 2 View Right   XR Hand 2 View Left   Sedimentation rate   C-reactive protein   C3 and C4   Ambulatory referral to Occupational Therapy  "

## 2025-01-04 NOTE — Telephone Encounter (Signed)
 Appeal has been submitted. Will advise when response is received, please be advised that most companies may take 30 days to make a decision. Appeal letter and supporting documentation have been faxed to [phone] on 01/04/2025 @12 :41 pm.  Thank you, Devere Pandy, PharmD Clinical Pharmacist  [place]  Direct Dial: [phone]

## 2025-01-04 NOTE — Assessment & Plan Note (Addendum)
 [name]

## 2025-01-05 LAB — SEDIMENTATION RATE: Sed Rate: 70 mm/h — ABNORMAL HIGH (ref 0–30)

## 2025-01-05 LAB — C3 AND C4
C3 Complement: 100 mg/dL (ref 83–193)
C4 Complement: 14 mg/dL — ABNORMAL LOW (ref 15–57)

## 2025-01-05 LAB — VITAMIN D 25 HYDROXY (VIT D DEFICIENCY, FRACTURES): Vit D, 25-Hydroxy: 66 ng/mL (ref 30–100)

## 2025-01-05 LAB — C-REACTIVE PROTEIN: CRP: 10.5 mg/L — ABNORMAL HIGH

## 2025-01-11 ENCOUNTER — Encounter (HOSPITAL_COMMUNITY)

## 2025-01-31 ENCOUNTER — Inpatient Hospital Stay: Attending: Hematology & Oncology

## 2025-01-31 ENCOUNTER — Inpatient Hospital Stay

## 2025-01-31 ENCOUNTER — Inpatient Hospital Stay: Admitting: Hematology & Oncology

## 2025-02-01 ENCOUNTER — Encounter (HOSPITAL_COMMUNITY): Payer: Self-pay

## 2025-02-01 ENCOUNTER — Ambulatory Visit (HOSPITAL_COMMUNITY): Admit: 2025-02-01 | Admitting: Gastroenterology

## 2025-02-28 ENCOUNTER — Inpatient Hospital Stay

## 2025-02-28 ENCOUNTER — Inpatient Hospital Stay: Admitting: Hematology & Oncology

## 2025-03-08 ENCOUNTER — Ambulatory Visit: Admitting: Internal Medicine

## 2025-05-15 ENCOUNTER — Ambulatory Visit: Admitting: Family Medicine

## 2025-12-25 ENCOUNTER — Encounter: Admitting: Family Medicine
